# Patient Record
Sex: Male | Born: 1950 | Race: White | Hispanic: No | Marital: Married | Smoking: Former smoker
Health system: Southern US, Community
[De-identification: ages and names within clinical notes are randomized; demographics above are authoritative.]

## PROBLEM LIST (undated history)

## (undated) DIAGNOSIS — K7030 Alcoholic cirrhosis of liver without ascites: Secondary | ICD-10-CM

## (undated) DIAGNOSIS — K219 Gastro-esophageal reflux disease without esophagitis: Secondary | ICD-10-CM

## (undated) DIAGNOSIS — I10 Essential (primary) hypertension: Secondary | ICD-10-CM

## (undated) DIAGNOSIS — Z87442 Personal history of urinary calculi: Secondary | ICD-10-CM

## (undated) DIAGNOSIS — Z9221 Personal history of antineoplastic chemotherapy: Secondary | ICD-10-CM

## (undated) DIAGNOSIS — E785 Hyperlipidemia, unspecified: Secondary | ICD-10-CM

## (undated) DIAGNOSIS — I4891 Unspecified atrial fibrillation: Secondary | ICD-10-CM

## (undated) DIAGNOSIS — C220 Liver cell carcinoma: Secondary | ICD-10-CM

## (undated) DIAGNOSIS — R079 Chest pain, unspecified: Secondary | ICD-10-CM

## (undated) HISTORY — DX: Chest pain, unspecified: R07.9

## (undated) HISTORY — DX: Liver cell carcinoma: C22.0

## (undated) HISTORY — DX: Essential (primary) hypertension: I10

## (undated) HISTORY — DX: Unspecified atrial fibrillation: I48.91

## (undated) HISTORY — DX: Hyperlipidemia, unspecified: E78.5

## (undated) HISTORY — PX: APPENDECTOMY: SHX54

---

## 2007-06-01 ENCOUNTER — Emergency Department (HOSPITAL_COMMUNITY): Admission: EM | Admit: 2007-06-01 | Discharge: 2007-06-02 | Payer: Self-pay | Admitting: Emergency Medicine

## 2010-08-29 DIAGNOSIS — I10 Essential (primary) hypertension: Secondary | ICD-10-CM | POA: Insufficient documentation

## 2010-09-01 ENCOUNTER — Ambulatory Visit: Payer: Self-pay | Admitting: Cardiology

## 2010-09-04 ENCOUNTER — Encounter (INDEPENDENT_AMBULATORY_CARE_PROVIDER_SITE_OTHER): Payer: Self-pay | Admitting: *Deleted

## 2010-09-11 NOTE — Letter (Signed)
Summary: Appointment - Missed  [facility], [place]  [address] [place]   [place], [place] [address]   Phone: [phone]  Fax: [phone]          September 04, 2010 MRN: [id number]      TREYVEON OUTING [address] [place], [place]  [address]      Dear Mr. Buchler,   Our records indicate you missed your appointment on September 01, 2010 at 9:45am with Dr. Aundra Dubin. It is very important that we reach you to reschedule this appointment. We look forward to participating in your health care needs. Please contact us at the number listed above at your earliest convenience to reschedule this appointment.     Sincerely,  [place] Scheduling Team

## 2010-10-16 ENCOUNTER — Encounter: Payer: Self-pay | Admitting: Cardiology

## 2010-10-21 ENCOUNTER — Encounter: Payer: Self-pay | Admitting: Cardiology

## 2012-01-26 ENCOUNTER — Emergency Department (HOSPITAL_COMMUNITY): Payer: BC Managed Care – PPO

## 2012-01-26 ENCOUNTER — Other Ambulatory Visit: Payer: Self-pay

## 2012-01-26 ENCOUNTER — Emergency Department (HOSPITAL_COMMUNITY)
Admission: EM | Admit: 2012-01-26 | Discharge: 2012-01-26 | Disposition: A | Discharge status: Home / Self Care | Payer: BC Managed Care – PPO | Attending: Emergency Medicine | Admitting: Emergency Medicine

## 2012-01-26 ENCOUNTER — Encounter (HOSPITAL_COMMUNITY): Payer: Self-pay | Admitting: Radiology

## 2012-01-26 ENCOUNTER — Ambulatory Visit (INDEPENDENT_AMBULATORY_CARE_PROVIDER_SITE_OTHER): Payer: BC Managed Care – PPO | Admitting: Emergency Medicine

## 2012-01-26 VITALS — BP 118/90 | HR 105 | Temp 98.0°F | Resp 16

## 2012-01-26 DIAGNOSIS — R42 Dizziness and giddiness: Secondary | ICD-10-CM | POA: Insufficient documentation

## 2012-01-26 DIAGNOSIS — R079 Chest pain, unspecified: Secondary | ICD-10-CM

## 2012-01-26 DIAGNOSIS — R002 Palpitations: Secondary | ICD-10-CM | POA: Insufficient documentation

## 2012-01-26 DIAGNOSIS — Z87891 Personal history of nicotine dependence: Secondary | ICD-10-CM | POA: Insufficient documentation

## 2012-01-26 DIAGNOSIS — Z9089 Acquired absence of other organs: Secondary | ICD-10-CM | POA: Insufficient documentation

## 2012-01-26 DIAGNOSIS — I4892 Unspecified atrial flutter: Secondary | ICD-10-CM

## 2012-01-26 DIAGNOSIS — I4891 Unspecified atrial fibrillation: Secondary | ICD-10-CM

## 2012-01-26 DIAGNOSIS — R0789 Other chest pain: Secondary | ICD-10-CM

## 2012-01-26 LAB — CBC WITH DIFFERENTIAL/PLATELET
Basophils Absolute: 0.1 10*3/uL (ref 0.0–0.1)
Basophils Relative: 1 % (ref 0–1)
Eosinophils Absolute: 0.2 10*3/uL (ref 0.0–0.7)
Eosinophils Relative: 3 % (ref 0–5)
HCT: 45.8 % (ref 39.0–52.0)
Hemoglobin: 16 g/dL (ref 13.0–17.0)
Lymphocytes Relative: 33 % (ref 12–46)
Lymphs Abs: 2.5 10*3/uL (ref 0.7–4.0)
MCH: 33.1 pg (ref 26.0–34.0)
MCHC: 34.9 g/dL (ref 30.0–36.0)
MCV: 94.8 fL (ref 78.0–100.0)
Monocytes Absolute: 0.7 10*3/uL (ref 0.1–1.0)
Monocytes Relative: 8 % (ref 3–12)
Neutro Abs: 4.3 10*3/uL (ref 1.7–7.7)
Neutrophils Relative %: 55 % (ref 43–77)
Platelets: 176 10*3/uL (ref 150–400)
RBC: 4.83 MIL/uL (ref 4.22–5.81)
RDW: 13.4 % (ref 11.5–15.5)
WBC: 7.8 10*3/uL (ref 4.0–10.5)

## 2012-01-26 LAB — BASIC METABOLIC PANEL
BUN: 18 mg/dL (ref 6–23)
CO2: 23 mEq/L (ref 19–32)
Calcium: 8.6 mg/dL (ref 8.4–10.5)
Chloride: 106 mEq/L (ref 96–112)
Creatinine, Ser: 1.02 mg/dL (ref 0.50–1.35)
GFR calc Af Amer: 89 mL/min — ABNORMAL LOW (ref >90)
GFR calc non Af Amer: 77 mL/min — ABNORMAL LOW (ref >90)
Glucose, Bld: 113 mg/dL — ABNORMAL HIGH (ref 70–99)
Potassium: 3.6 mEq/L (ref 3.5–5.1)
Sodium: 139 mEq/L (ref 135–145)

## 2012-01-26 MED ORDER — DILTIAZEM HCL ER COATED BEADS 240 MG PO CP24
240.0000 mg | ORAL_CAPSULE | Freq: Once | ORAL | Status: AC
  Administered 2012-01-26: 240 mg via ORAL
  Filled 2012-01-26: qty 1

## 2012-01-26 MED ORDER — DILTIAZEM HCL ER COATED BEADS 240 MG PO CP24: 240.0000 mg | ORAL_CAPSULE | Freq: Every day | ORAL | Status: DC

## 2012-01-26 MED ORDER — ASPIRIN 81 MG PO CHEW
324.0000 mg | CHEWABLE_TABLET | Freq: Once | ORAL | Status: AC
  Administered 2012-01-26: 324 mg via ORAL

## 2012-01-26 MED ORDER — DILTIAZEM HCL 90 MG PO TABS
240.0000 mg | ORAL_TABLET | Freq: Once | ORAL | Status: DC
  Filled 2012-01-26: qty 1

## 2012-01-26 MED ORDER — DILTIAZEM HCL 50 MG/10ML IV SOLN
10.0000 mg | Freq: Once | INTRAVENOUS | Status: AC
  Administered 2012-01-26: 10 mg via INTRAVENOUS
  Filled 2012-01-26: qty 2

## 2012-01-26 MED ORDER — NITROGLYCERIN 0.3 MG SL SUBL
0.4000 mg | SUBLINGUAL_TABLET | SUBLINGUAL | Status: DC | PRN
  Administered 2012-01-26: 0.4 mg via SUBLINGUAL

## 2012-01-26 MED ORDER — DILTIAZEM HCL 100 MG IV SOLR
5.0000 mg/h | INTRAVENOUS | Status: DC
  Administered 2012-01-26: 7.5 mg/h via INTRAVENOUS

## 2012-01-26 MED ORDER — HEPARIN BOLUS VIA INFUSION
4000.0000 [IU] | Freq: Once | INTRAVENOUS | Status: AC
  Administered 2012-01-26: 4000 [IU] via INTRAVENOUS

## 2012-01-26 MED ORDER — HEPARIN (PORCINE) IN NACL 100-0.45 UNIT/ML-% IJ SOLN
1400.0000 [IU]/h | INTRAMUSCULAR | Status: DC
  Administered 2012-01-26: 1400 [IU]/h via INTRAVENOUS
  Filled 2012-01-26: qty 250

## 2012-01-26 NOTE — ED Notes (Signed)
Pt presents with a new onset of a fib. Pt reports intermittent chest pressure and palpations with exertion X 1 day. Pt states that he has had palpations X 3 years. Pt has a familial hx of cardiac problem. Pt received nitro X 1 and 324 ASAmg at [facility]

## 2012-01-26 NOTE — ED Provider Notes (Signed)
Heart rate remains below 100 after two hours of PO Diltiazem dose. Per Dr. Elmarie Shiley instructions, patient can be discharged home and will be provided follow up in the office. He is being discharged on Diltiazem 240 mg daily.   Leotis Shames, PA-C 01/26/12 2224

## 2012-01-26 NOTE — ED Provider Notes (Signed)
History     CSN: [id number]  Arrival date & time 01/26/12  1552   First MD Initiated Contact with Patient 01/26/12 1601      Chief Complaint  Patient presents with  . Atrial Fibrillation    (Consider location/radiation/quality/duration/timing/severity/associated sxs/prior treatment) Patient is a 62 y.o. male presenting with atrial fibrillation. The history is provided by the patient.  Atrial Fibrillation This is a new problem. Episode onset: undetermined and it could be greater than one year ago. The problem occurs intermittently. The problem has been unchanged. Pertinent negatives include no abdominal pain, chest pain, congestion, coughing, fatigue, fever, headaches, nausea, neck pain, numbness, rash, sore throat, vomiting or weakness. Nothing aggravates the symptoms. He has tried nothing for the symptoms. The treatment provided no relief.    History reviewed. No pertinent past medical history.  Past Surgical History  Procedure Date  . Appendectomy     History reviewed. No pertinent family history.  History  Substance Use Topics  . Smoking status: Former Research scientist (life sciences)  . Smokeless tobacco: Not on file  . Alcohol Use: 1.2 oz/week    2 Cans of beer per week      Review of Systems  Constitutional: Negative for fever, activity change, appetite change and fatigue.  HENT: Negative for congestion, sore throat, facial swelling, rhinorrhea, trouble swallowing, neck pain, neck stiffness, voice change and sinus pressure.   Eyes: Negative.   Respiratory: Negative for cough, choking, chest tightness, shortness of breath and wheezing.   Cardiovascular: Positive for palpitations. Negative for chest pain and leg swelling.  Gastrointestinal: Negative for nausea, vomiting and abdominal pain.  Genitourinary: Negative for dysuria, urgency, frequency, hematuria, flank pain and difficulty urinating.  Musculoskeletal: Negative for back pain and gait problem.  Skin: Negative for rash and wound.    Neurological: Positive for light-headedness. Negative for facial asymmetry, weakness, numbness and headaches.  Psychiatric/Behavioral: Negative for behavioral problems, confusion and agitation. The patient is not nervous/anxious and is not hyperactive.   All other systems reviewed and are negative.    Allergies  Review of patient's allergies indicates no known allergies.  Home Medications   Current Outpatient Rx  Name Route Sig Dispense Refill  . IBUPROFEN 100 MG PO TABS Oral Take 100 mg by mouth every 6 (six) hours as needed.      BP 147/85  Pulse 149  Temp 97.7 F (36.5 C) (Oral)  Resp 19  SpO2 97%  Physical Exam  Nursing note and vitals reviewed. Constitutional: He is oriented to person, place, and time. He appears well-developed and well-nourished. No distress.  HENT:  Head: Normocephalic and atraumatic.  Right Ear: External ear normal.  Left Ear: External ear normal.  Mouth/Throat: No oropharyngeal exudate.  Eyes: Conjunctivae and EOM are normal. Pupils are equal, round, and reactive to light. Right eye exhibits no discharge. Left eye exhibits no discharge.  Neck: Normal range of motion. Neck supple. No JVD present. No tracheal deviation present. No thyromegaly present.  Cardiovascular: Normal heart sounds and intact distal pulses.  Exam reveals no gallop and no friction rub.   No murmur heard.      Irregularly irregular and tachycardia   Pulmonary/Chest: Effort normal and breath sounds normal. No respiratory distress. He has no wheezes. He exhibits no tenderness.  Abdominal: Soft. Bowel sounds are normal. He exhibits no distension. There is no tenderness. There is no rebound and no guarding.  Musculoskeletal: Normal range of motion. He exhibits no edema and no tenderness.  Lymphadenopathy:  He has no cervical adenopathy.  Neurological: He is alert and oriented to person, place, and time. No cranial nerve deficit.  Skin: Skin is warm and dry. No rash noted. He is  not diaphoretic. No pallor.  Psychiatric: He has a normal mood and affect. His behavior is normal.    ED Course  Procedures (including critical care time)   Labs Reviewed  TSH  CBC WITH DIFFERENTIAL  BASIC METABOLIC PANEL   No results found.   No diagnosis found.    MDM  61 year old male with no reported past medical history presents here after being seen by his primary care physician today and diagnosed with atrial fibrillation with RVR. Patient says that for the past year he has episodes where he feels fluttering sensation in his chest and lightheadedness the last 10-20 minutes and resolves on its own. Patient said he was having such episode today decided to call the primary care physician and be evaluated, that is when they found that he was in atrial fibrillation with RVR. Patient not complaining of chest pain but rather a fluttery sensation in his chest associated with some lightheadedness. Patient denies shortness of breath nausea vomiting abdominal pain fevers cough. Patient is tachycardic but without chest pain or signs of DVT hemoptysis or tachypnea so I doubt new onset pulmonary embolism. Patient has had these episodes for greater than a year started doubt new medical process but rather patient just recently found out what was causing the symptoms. Will get chest x-ray thyroid studies and electrolytes studies and we'll anticoagulate the patient given the fact that we don't know how long he been in an irregular rhythm. We'll also give diltiazem to control rate and call cardiology for possible future cardioversion.  Results for orders placed during the hospital encounter of 01/26/12  CBC WITH DIFFERENTIAL      Component Value Range   WBC 7.8  4.0 - 10.5 K/uL   RBC 4.83  4.22 - 5.81 MIL/uL   Hemoglobin 16.0  13.0 - 17.0 g/dL   HCT 45.8  39.0 - 52.0 %   MCV 94.8  78.0 - 100.0 fL   MCH 33.1  26.0 - 34.0 pg   MCHC 34.9  30.0 - 36.0 g/dL   RDW 13.4  11.5 - 15.5 %   Platelets 176   150 - 400 K/uL   Neutrophils Relative 55  43 - 77 %   Neutro Abs 4.3  1.7 - 7.7 K/uL   Lymphocytes Relative 33  12 - 46 %   Lymphs Abs 2.5  0.7 - 4.0 K/uL   Monocytes Relative 8  3 - 12 %   Monocytes Absolute 0.7  0.1 - 1.0 K/uL   Eosinophils Relative 3  0 - 5 %   Eosinophils Absolute 0.2  0.0 - 0.7 K/uL   Basophils Relative 1  0 - 1 %   Basophils Absolute 0.1  0.0 - 0.1 K/uL  BASIC METABOLIC PANEL      Component Value Range   Sodium 139  135 - 145 mEq/L   Potassium 3.6  3.5 - 5.1 mEq/L   Chloride 106  96 - 112 mEq/L   CO2 23  19 - 32 mEq/L   Glucose, Bld 113 (*) 70 - 99 mg/dL   BUN 18  6 - 23 mg/dL   Creatinine, Ser 1.02  0.50 - 1.35 mg/dL   Calcium 8.6  8.4 - 10.5 mg/dL   GFR calc non Af Amer 77 (*) >90 mL/min  GFR calc Af Amer 89 (*) >90 mL/min   DG Chest 2 View (Final result)   Result time:01/26/12 1645    Final result by Rad Results In Interface (01/26/12 16:45:29)    Narrative:   *RADIOLOGY REPORT*  Clinical Data: Atrial fibrillation  CHEST - 2 VIEW  Comparison: None.  Findings: Cardiomediastinal silhouette is unremarkable. No acute infiltrate or pleural effusion. No pulmonary edema. Bony thorax is unremarkable.  IMPRESSION: No active disease.  Original Report Authenticated By: Lahoma Crocker, M.D.    Date: 01/27/2012  Rate: 130- 140  Rhythm: atrial fibrillation  QRS Axis: normal  Intervals: normal  ST/T Wave abnormalities: normal  Conduction Disutrbances:none  Narrative Interpretation:   Old EKG Reviewed: none available  Case discussed with on-call cardiologist who feels given his CHADS2 score of 1 for hypertension patient is at low risk for embolic events and does not need to be anticoagulated at this time. Suggested discontinuation of heparin and to transition patient from diltiazem drip to by mouth diltiazem. Patient rate controlled with 10 mg diltiazem and 7.5 mg per hour diltiazem drip. Patient given 240 mg diltiazem by mouth and sent to CDU to ensure  that heart rate remains stable. If he remains stable patient will followup with cardiology tomorrow will be instructed to take aspirin daily and will be instructed to take diltiazem 240 mg by mouth daily.  Case discussed with Dr. Jeanine Luz, MD 01/27/12 [phone]

## 2012-01-26 NOTE — Progress Notes (Signed)
  Subjective:    Patient ID: Dillon Perkins, male    DOB: [birth year], 61 y.o.   MRN: [id number]  HPI    Review of Systems     Objective:   Physical Exam        Assessment & Plan:

## 2012-01-26 NOTE — Progress Notes (Signed)
2L O2 started at 2:45 p.m by Windell Hummingbird, PA-C

## 2012-01-26 NOTE — Progress Notes (Signed)
ANTICOAGULATION CONSULT NOTE - Initial Consult  Pharmacy Consult for Heparin Indication: Atrial fibrillation  No Known Allergies  Patient Measurements: Height: 5'8" Weight: 99.8kg Heparin dosing weight: 89.7kg  Vital Signs: Temp: 97.7 F (36.5 C) (07/23 1608) Temp src: Oral (07/23 1608) BP: 147/85 mmHg (07/23 1608) Pulse Rate: 149  (07/23 1608)  Medical History: History reviewed. No pertinent past medical history.  Medications:  No anticoagulants pta  Assessment: Patient is a 61 year old male who presents with new onset afib with RVR. He will begin IV heparin. Baseline CBC and renal function within normal limits.  Goal of Therapy:  Heparin level 0.3-0.7 units/ml Monitor platelets by anticoagulation protocol: Yes   Plan:  1. Initiate heparin bolus 4000 units. 2  Initiate heparin maintenance dose 1400 units/hour 3. Draw 6 hour heparin level 4. Daily heparin level and CBC  Bola A. Cody, [place] Pharmacist Pager:[phone] Phone [phone] 01/26/2012 4:53 PM

## 2012-01-26 NOTE — Progress Notes (Signed)
  Subjective:    Patient ID: Dillon Perkins, male    DOB: 17-Apr-1951, 61 y.o.   MRN: [id number]  Chest Pain  This is a new problem. The current episode started today. The onset quality is sudden. The problem occurs constantly. The problem has been gradually improving. The pain is present in the substernal region. The pain is at a severity of 3/10. The pain is mild. The quality of the pain is described as pressure. The pain does not radiate. Associated symptoms include exertional chest pressure. Pertinent negatives include no abdominal pain, back pain, claudication, cough, diaphoresis, dizziness, fever, headaches, hemoptysis, irregular heartbeat, leg pain, lower extremity edema, malaise/fatigue, nausea, near-syncope, numbness, orthopnea, palpitations, PND, shortness of breath, sputum production, syncope, vomiting or weakness. The pain is aggravated by exertion. He has tried nothing for the symptoms. Risk factors include lack of exercise, male gender, obesity and smoking/tobacco exposure.  His past medical history is significant for hyperlipidemia and hypertension.  Pertinent negatives for past medical history include no aneurysm, no anxiety/panic attacks, no aortic aneurysm, no aortic dissection, no arrhythmia, no bicuspid aortic valve, no CAD, no cancer, no congenital heart disease, no connective tissue disease, no COPD, no CHF, no diabetes, no DVT, no hyperhomocysteinemia, no Marfan's syndrome, no MI, no mitral valve prolapse, no pacemaker, no PE, no PVD, no recent injury, no rheumatic fever, no seizures, no sickle cell disease, no sleep apnea, no spontaneous pneumothorax, no stimulant use, no strokes, no thyroid problem, no TIA and Turner syndrome.  His family medical history is significant for CAD in family, heart disease in family, hyperlipidemia in family, hypertension in family and early MI in family.  Pertinent negatives for family medical history include: family history of aortic dissection, no  connective tissue disease in family, no diabetes in family, no PE in family, no PVD in family, no sickle cell disease in family, no stroke in family and no TIA in family. Prior diagnostic workup includes echocardiogram.      Review of Systems  Constitutional: Positive for fatigue. Negative for fever, chills, malaise/fatigue, diaphoresis, activity change, appetite change and unexpected weight change.  HENT: Negative.   Eyes: Negative.   Respiratory: Negative.  Negative for cough, hemoptysis, sputum production and shortness of breath.   Cardiovascular: Positive for chest pain. Negative for palpitations, orthopnea, claudication, leg swelling, syncope, PND and near-syncope.  Gastrointestinal: Negative.  Negative for nausea, vomiting and abdominal pain.  Genitourinary: Negative.   Musculoskeletal: Negative.  Negative for back pain.  Neurological: Negative for dizziness, seizures, weakness, numbness and headaches.  Hematological: Negative.        Objective:   Physical Exam  Constitutional: He is oriented to person, place, and time. He appears well-developed and well-nourished.  HENT:  Head: Normocephalic and atraumatic.  Right Ear: External ear normal.  Left Ear: External ear normal.  Eyes: Conjunctivae are normal. Pupils are equal, round, and reactive to light.  Neck: Normal range of motion. No tracheal deviation present.  Cardiovascular: Normal rate, normal heart sounds and intact distal pulses.  An irregularly irregular rhythm present.  Pulmonary/Chest: Effort normal and breath sounds normal.  Abdominal: Soft. There is no tenderness.  Musculoskeletal: Normal range of motion.  Neurological: He is alert and oriented to person, place, and time. No cranial nerve deficit.  Skin: Skin is warm and dry.          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

## 2012-01-26 NOTE — ED Notes (Signed)
Pt discharged home with family in good condition.

## 2012-01-26 NOTE — ED Notes (Signed)
MD at bedside. 

## 2012-01-27 ENCOUNTER — Encounter: Payer: Self-pay | Admitting: Cardiology

## 2012-01-27 ENCOUNTER — Ambulatory Visit (INDEPENDENT_AMBULATORY_CARE_PROVIDER_SITE_OTHER): Payer: BC Managed Care – PPO | Admitting: Cardiology

## 2012-01-27 VITALS — BP 149/101 | HR 88 | Ht 68.0 in | Wt 217.0 lb

## 2012-01-27 DIAGNOSIS — R0989 Other specified symptoms and signs involving the circulatory and respiratory systems: Secondary | ICD-10-CM

## 2012-01-27 DIAGNOSIS — I4891 Unspecified atrial fibrillation: Secondary | ICD-10-CM

## 2012-01-27 DIAGNOSIS — R0683 Snoring: Secondary | ICD-10-CM

## 2012-01-27 DIAGNOSIS — I10 Essential (primary) hypertension: Secondary | ICD-10-CM

## 2012-01-27 LAB — TSH: TSH: 2.068 u[IU]/mL (ref 0.350–4.500)

## 2012-01-27 MED ORDER — DILTIAZEM HCL ER COATED BEADS 240 MG PO CP24: 240.0000 mg | ORAL_CAPSULE | Freq: Every day | ORAL | Status: DC

## 2012-01-27 NOTE — Patient Instructions (Addendum)
Your physician has recommended you make the following change in your medication: START Diltiazem CD 240mg  take one by mouth daily, START Aspirin 325mg  take one by mouth daily  Your physician recommends that you schedule a follow-up appointment in: [place] with Dr Lia Foyer  Your physician recommends that you have lab work in 1 WEEK: LIPID, LIVER and Free T4--nothing to eat or drink after midnight, lab opens at 8:30  Your physician has requested that you have an echocardiogram. Echocardiography is a painless test that uses sound waves to create images of your heart. It provides your doctor with information about the size and shape of your heart and how well your heart's chambers and valves are working. This procedure takes approximately one hour. There are no restrictions for this procedure.  Your physician has recommended that you have a sleep study. This test records several body functions during sleep, including: brain activity, eye movement, oxygen and carbon dioxide blood levels, heart rate and rhythm, breathing rate and rhythm, the flow of air through your mouth and nose, snoring, body muscle movements, and chest and belly movement.

## 2012-01-27 NOTE — ED Provider Notes (Signed)
Medical screening examination/treatment/procedure(s) were performed by non-physician practitioner and as supervising physician I was immediately available for consultation/collaboration.   [name]. Alik Mawson, MD 01/27/12 1656

## 2012-01-27 NOTE — ED Provider Notes (Signed)
I saw and evaluated the patient, reviewed the resident's note and I agree with the findings and plan.  I reviewed and agree with ECG interpretation by Dr. Kerin Ransom.  Pt with no CP, slight dyspnea and feeling light headed, has had numerous episodes in the past, never had prior work up or eval.  Saw PM who sent to the ED.  Pt had rate control with IV diltiazem initially, tolerated well.  Dr. Kerin Ransom discussed with Dr. Acie Fredrickson who felt pt could be monitored in the ED, transitioned to oral diltiazem and follow up in office.  Pt moved to CDU once on oral meds and observed.  CRITICAL CARE Performed by: Donzetta Matters   Total critical care time:30 min  Critical care time was exclusive of separately billable procedures and treating other patients.  Critical care was necessary to treat or prevent imminent or life-threatening deterioration.  Critical care was time spent personally by me on the following activities: development of treatment plan with patient and/or surrogate as well as nursing, discussions with consultants, evaluation of patient's response to treatment, examination of patient, obtaining history from patient or surrogate, ordering and performing treatments and interventions, ordering and review of laboratory studies, ordering and review of radiographic studies, pulse oximetry and re-evaluation of patient's condition.     Pt stabilized in terms of HR, BP maintained as was mentation, other labs, CXR unremarkable.  No ischemia.  ACS not suspected.  Pt eventually discharged from CDU.    Impression: Atrial fibrillation with RVR   [name]. Chon Buhl, MD 01/27/12 1659

## 2012-01-31 ENCOUNTER — Encounter: Payer: Self-pay | Admitting: Cardiology

## 2012-01-31 DIAGNOSIS — R0683 Snoring: Secondary | ICD-10-CM | POA: Insufficient documentation

## 2012-01-31 DIAGNOSIS — I4891 Unspecified atrial fibrillation: Secondary | ICD-10-CM | POA: Insufficient documentation

## 2012-01-31 NOTE — Assessment & Plan Note (Signed)
Snores at night.  May have had a remote sleep study but we do not have.  Would recommend.

## 2012-01-31 NOTE — Progress Notes (Signed)
HPI:  The patient is seen as an add-on today. He was seen in the emergency room, and the following was the documentation of the EDP:  61 year old male with no reported past medical history presents here after being seen by his primary care physician today and diagnosed with atrial fibrillation with RVR. Patient says that for the past year he has episodes where he feels fluttering sensation in his chest and lightheadedness the last 10-20 minutes and resolves on its own. Patient said he was having such episode today decided to call the primary care physician and be evaluated, that is when they found that he was in atrial fibrillation with RVR. Patient not complaining of chest pain but rather a fluttery sensation in his chest associated with some lightheadedness. Patient denies shortness of breath nausea vomiting abdominal pain fevers cough. Patient is tachycardic but without chest pain or signs of DVT hemoptysis or tachypnea so I doubt new onset pulmonary embolism. Patient has had these episodes for greater than a year started doubt new medical process but rather patient just recently found out what was causing the symptoms. Will get chest x-ray thyroid studies and electrolytes studies and we'll anticoagulate the patient given the fact that we don't know how long he been in an irregular rhythm. We'll also give diltiazem to control rate and call cardiology for possible future cardioversion.  Heart rate remains below 100 after two hours of PO Diltiazem dose. Per Dr. Elmarie Shiley instructions, patient can be discharged home and will be provided follow up in the office. He is being discharged on Diltiazem 240 mg daily.  Leotis Shames, PA-C  01/26/12 2224  Hence, he is added on to the doctor of the day.  He has had no exertional chest pain.  He has had no bleeding issues.  He does admit to snoring at night, and may have had a sleep study at some point in the past.  He is now here for evaluation.  He also has not  had weight loss, nor heat or cold intolerance.  The patient works at [employer] so he does do a lot of walking.     Current Outpatient Prescriptions  Medication Sig Dispense Refill  . diltiazem (CARDIZEM CD) 240 MG 24 hr capsule Take 1 capsule (240 mg total) by mouth daily.  30 capsule  6  . ibuprofen (ADVIL,MOTRIN) 200 MG tablet Take 600 mg by mouth every 6 (six) hours as needed. For pain      . aspirin EC 325 MG tablet Take 1 tablet (325 mg total) by mouth daily.  1 tablet  0    No Known Allergies  Past Medical History  Diagnosis Date  . Atrial fibrillation   . Chest pain   . Hyperlipidemia   . Hypertension     Past Surgical History  Procedure Date  . Appendectomy     Family History  Problem Relation Age of Onset  . Cancer Mother   . Heart disease Father   . Heart disease Sister     History   Social History  . Marital Status: Married    Spouse Name: N/A    Number of Children: N/A  . Years of Education: N/A   Occupational History  . Not on file.   Social History Main Topics  . Smoking status: Former Research scientist (life sciences)  . Smokeless tobacco: Not on file  . Alcohol Use: 1.2 oz/week    2 Cans of beer per week  . Drug Use: Not on file  .  Sexually Active: Not on file   Other Topics Concern  . Not on file   Social History Narrative  . No narrative on file    ROS: Please see the HPI.  All other systems reviewed and negative.  He specifically denies history of bleeding, syncope, presyncope, chest pain, pulmonary issues.  He admits to snoring.   PHYSICAL EXAM:  BP 149/101  Pulse 88  Ht 5\' 8"  (1.727 m)  Wt 217 lb (98.431 kg)  BMI 32.99 kg/m2  130/90 by me equal in both arms.    General: Well developed, well nourished, in no acute distress. Head:  Normocephalic and atraumatic. Neck: no JVD Lungs: Clear to auscultation and percussion. Heart: Normal S1 and S2.  No murmur, rubs or gallops. REGULAR on exam today in clinic.  Abdomen:  Normal bowel sounds; soft; non tender;  no organomegaly Pulses: Pulses normal in all 4 extremities. Extremities: No clubbing or cyanosis. No edema. Neurologic: Alert and oriented x 3.  EKG:  Tracing 1.  Atrial fib with rapid ventricular response    #2.  NSR.  Nonspecific T wave flattening  ASSESSMENT AND PLAN:

## 2012-01-31 NOTE — Assessment & Plan Note (Addendum)
Elevated today.  Will stop dilt  (he has not filled) and add once per day therapy for rate control and BP.  Reviewed with patient.

## 2012-01-31 NOTE — Assessment & Plan Note (Addendum)
Has converted to NSR.  His BP is elevated, so he does have a CHADS2 1 score, and CHADS vasc 2 score of 1 as well given his age and gender, and lack of other diagnoses.  He is at fairly low risk.  We will add dilt CD to his regimen, complete his thyroid workup, get a 2D echo study to assess, and see how he does.  Also, we will get him back for follow up.  He is in agreement.  Sleep study also in order.  TSH was normal.

## 2012-02-02 ENCOUNTER — Other Ambulatory Visit (INDEPENDENT_AMBULATORY_CARE_PROVIDER_SITE_OTHER): Payer: BC Managed Care – PPO

## 2012-02-02 ENCOUNTER — Ambulatory Visit (HOSPITAL_COMMUNITY): Payer: BC Managed Care – PPO | Attending: Cardiology

## 2012-02-02 DIAGNOSIS — I4891 Unspecified atrial fibrillation: Secondary | ICD-10-CM | POA: Insufficient documentation

## 2012-02-02 DIAGNOSIS — I080 Rheumatic disorders of both mitral and aortic valves: Secondary | ICD-10-CM | POA: Insufficient documentation

## 2012-02-02 DIAGNOSIS — I10 Essential (primary) hypertension: Secondary | ICD-10-CM | POA: Insufficient documentation

## 2012-02-02 DIAGNOSIS — R072 Precordial pain: Secondary | ICD-10-CM | POA: Insufficient documentation

## 2012-02-02 DIAGNOSIS — I079 Rheumatic tricuspid valve disease, unspecified: Secondary | ICD-10-CM | POA: Insufficient documentation

## 2012-02-02 DIAGNOSIS — E785 Hyperlipidemia, unspecified: Secondary | ICD-10-CM | POA: Insufficient documentation

## 2012-02-02 LAB — LIPID PANEL
Cholesterol: 165 mg/dL (ref 0–200)
HDL: 42.8 mg/dL (ref >39.00)
LDL Cholesterol: 93 mg/dL (ref 0–99)
Total CHOL/HDL Ratio: 4
Triglycerides: 144 mg/dL (ref 0.0–149.0)
VLDL: 28.8 mg/dL (ref 0.0–40.0)

## 2012-02-02 LAB — HEPATIC FUNCTION PANEL
ALT: 76 U/L — ABNORMAL HIGH (ref 0–53)
AST: 48 U/L — ABNORMAL HIGH (ref 0–37)
Albumin: 3.9 g/dL (ref 3.5–5.2)
Alkaline Phosphatase: 52 U/L (ref 39–117)
Bilirubin, Direct: 0.1 mg/dL (ref 0.0–0.3)
Total Bilirubin: 0.7 mg/dL (ref 0.3–1.2)
Total Protein: 7.1 g/dL (ref 6.0–8.3)

## 2012-02-02 LAB — T4, FREE: Free T4: 0.64 ng/dL (ref 0.60–1.60)

## 2012-02-02 NOTE — Progress Notes (Signed)
Echocardiogram performed.  

## 2012-02-04 ENCOUNTER — Ambulatory Visit (INDEPENDENT_AMBULATORY_CARE_PROVIDER_SITE_OTHER): Payer: BC Managed Care – PPO | Admitting: Cardiology

## 2012-02-04 ENCOUNTER — Encounter: Payer: Self-pay | Admitting: Cardiology

## 2012-02-04 VITALS — BP 157/84 | HR 79 | Ht 68.0 in | Wt 220.4 lb

## 2012-02-04 DIAGNOSIS — R7989 Other specified abnormal findings of blood chemistry: Secondary | ICD-10-CM

## 2012-02-04 DIAGNOSIS — I4891 Unspecified atrial fibrillation: Secondary | ICD-10-CM

## 2012-02-04 DIAGNOSIS — R0989 Other specified symptoms and signs involving the circulatory and respiratory systems: Secondary | ICD-10-CM

## 2012-02-04 DIAGNOSIS — R0609 Other forms of dyspnea: Secondary | ICD-10-CM

## 2012-02-04 DIAGNOSIS — E785 Hyperlipidemia, unspecified: Secondary | ICD-10-CM

## 2012-02-04 DIAGNOSIS — R945 Abnormal results of liver function studies: Secondary | ICD-10-CM

## 2012-02-04 DIAGNOSIS — R0683 Snoring: Secondary | ICD-10-CM

## 2012-02-04 DIAGNOSIS — I10 Essential (primary) hypertension: Secondary | ICD-10-CM

## 2012-02-04 NOTE — Progress Notes (Signed)
   HPI:  He  is seen in followup. From a clinical standpoint he is doing well. He's not noted that he is gone into atrial fibrillation. It should be noted his laboratory studies returned and his mild elevation in liver function studies and his T4 is normal. He's not had any chest pain patient and  is being set up for a stress test. His left ventricular function is normal by echo with a pattern of mild left ventricular hypertrophy.  He is tolerating his meds well.     Current Outpatient Prescriptions  Medication Sig Dispense Refill  . aspirin EC 325 MG tablet Take 1 tablet (325 mg total) by mouth daily.  1 tablet  0  . diltiazem (CARDIZEM CD) 240 MG 24 hr capsule Take 1 capsule (240 mg total) by mouth daily.  30 capsule  6  . ibuprofen (ADVIL,MOTRIN) 200 MG tablet Take 600 mg by mouth every 6 (six) hours as needed. For pain        No Known Allergies  Past Medical History  Diagnosis Date  . Atrial fibrillation   . Chest pain   . Hyperlipidemia   . Hypertension     Past Surgical History  Procedure Date  . Appendectomy     Family History  Problem Relation Age of Onset  . Heart disease Father   . Heart disease Sister     History   Social History  . Marital Status: Married    Spouse Name: N/A    Number of Children: N/A  . Years of Education: N/A   Occupational History  . Not on file.   Social History Main Topics  . Smoking status: Former Research scientist (life sciences)  . Smokeless tobacco: Never Used  . Alcohol Use: 1.2 oz/week    2 Cans of beer per week  . Drug Use: Not on file  . Sexually Active: Not on file   Other Topics Concern  . Not on file   Social History Narrative  . No narrative on file    ROS: Please see the HPI.  All other systems reviewed and negative.  PHYSICAL EXAM:  BP 157/84  Pulse 79  Ht 5\' 8"  (1.727 m)  Wt 220 lb 6.4 oz (99.973 kg)  BMI 33.51 kg/m2  General: Well developed, well nourished, in no acute distress. Head:  Normocephalic and atraumatic. Neck: no  JVD Lungs: Clear to auscultation and percussion. Heart: Normal S1 and S2.  No murmur, rubs or gallops.  Abdomen:  Normal bowel sounds; soft; non tender; no organomegaly Pulses: Pulses normal in all 4 extremities. Extremities: No clubbing or cyanosis. No edema. Neurologic: Alert and oriented x 3.  EKG:  NSR.with sinus arrhythmia.  No acute changes.  ASSESSMENT AND PLAN:

## 2012-02-07 DIAGNOSIS — E785 Hyperlipidemia, unspecified: Secondary | ICD-10-CM | POA: Insufficient documentation

## 2012-02-07 DIAGNOSIS — R945 Abnormal results of liver function studies: Secondary | ICD-10-CM | POA: Insufficient documentation

## 2012-02-07 NOTE — Assessment & Plan Note (Signed)
No obvious recurrence.  Have recommended for now just an ASA, and meds for rate control should he develop.  We may want to get a holter eventually to see if he is in and out.

## 2012-02-07 NOTE — Assessment & Plan Note (Signed)
Defer treatment to [place].

## 2012-02-07 NOTE — Assessment & Plan Note (Signed)
Sleep study has been scheduled for February 16, 2012.  Will await results.  He is sure he has it.

## 2012-02-07 NOTE — Assessment & Plan Note (Signed)
BP is somewhat improved, but has a way to do.  Treatment of OSA, if present, will help.  I have encouraged him to get a home BP cuff, and write down the results and take to Dr. Everlene Farrier for follow up.

## 2012-02-09 NOTE — Patient Instructions (Signed)
Your physician has requested that you have an exercise tolerance test. For further information please visit [email or web address]. Please also follow instruction sheet, as given.  Your physician recommends that you continue on your current medications as directed. Please refer to the Current Medication list given to you today.

## 2012-02-16 ENCOUNTER — Ambulatory Visit (HOSPITAL_BASED_OUTPATIENT_CLINIC_OR_DEPARTMENT_OTHER): Payer: BC Managed Care – PPO | Attending: Cardiology | Admitting: Radiology

## 2012-02-16 VITALS — Ht 68.0 in | Wt 220.0 lb

## 2012-02-16 DIAGNOSIS — G4710 Hypersomnia, unspecified: Secondary | ICD-10-CM | POA: Insufficient documentation

## 2012-02-16 DIAGNOSIS — I4891 Unspecified atrial fibrillation: Secondary | ICD-10-CM

## 2012-02-17 DIAGNOSIS — G4730 Sleep apnea, unspecified: Secondary | ICD-10-CM

## 2012-02-17 DIAGNOSIS — G4710 Hypersomnia, unspecified: Secondary | ICD-10-CM

## 2012-02-18 NOTE — Procedures (Signed)
Dillon Perkins, Dillon Perkins             ACCOUNT NO.:  [id number]  MEDICAL RECORD NO.:  [id number]          PATIENT TYPE:  OUT  LOCATION:  SLEEP CENTER                 FACILITY:  [facility]  PHYSICIAN:  Kathee Delton, MD,FCCPDATE OF BIRTH:  06/16/49  DATE OF STUDY:  02/16/2012                           NOCTURNAL POLYSOMNOGRAM  REFERRING PHYSICIAN:  Loretha Brasil. Lia Foyer, MD, [facility]  LOCATION:  Sleep Lab.  INDICATION FOR STUDY:  Hypersomnia with sleep apnea.  EPWORTH SLEEPINESS SCORE:  7.  MEDICATIONS:  SLEEP ARCHITECTURE:  The patient had a total sleep time of only 175 minutes, with no slow-wave sleep and only 18 minutes of REM.  Sleep onset latency was normal at 11 minutes, and REM onset was prolonged at 315 minutes.  Sleep efficiency was poor at 45%.  RESPIRATORY DATA:  The patient was found to have 2 obstructive apneas and 2 obstructive hypopneas, giving him an AHI of only 1.4 events per hour.  He was also noted to have moderate numbers of episodes with decrease in air flow, however did not meet the strict criteria for an obstructive hypopnea or apnea.  These are consistent with respiratory effort related arousals.  The patient's events occurred in all body positions, and there was moderate-to-loud snoring noted throughout.  The patient did not meet split night criteria secondary to his small numbers of formal obstructive events.  OXYGEN DATA:  There was O2 desaturation as low as 90% with the patient's obstructive events.  CARDIAC DATA:  Occasional PVC noted, but no clinically significant arrhythmias were seen.  MOVEMENT-PARASOMNIA:  The patient had small numbers of leg jerks, which were not clinically significant.  There were no abnormal behaviors noted.  IMPRESSIONS-RECOMMENDATIONS: 1. Small numbers of obstructive events, which do not meet the AHI     criteria for the obstructive sleep apnea syndrome.  The patient did     have decreased total sleep time and REM, however he had  more than     adequate opportunity to exhibit clinically significant sleep apnea.     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     This is a pre-sleep apnea condition that he is typically treated     with weight loss, positional therapy, upper airway surgery, and     possibly a dental appliance. 2. Occasional PVC noted, but no clinically significant arrhythmias     were seen.     Kathee Delton, MD,FCCP Diplomate, [place] Board of Sleep Medicine    [facility]/MEDQ  D:  02/17/2012 08:48:29  T:  02/18/2012 00:01:50  Job:  [id number]

## 2012-03-18 ENCOUNTER — Encounter: Payer: BC Managed Care – PPO | Admitting: Cardiology

## 2012-03-22 ENCOUNTER — Telehealth: Payer: Self-pay | Admitting: Cardiology

## 2012-03-22 NOTE — Telephone Encounter (Signed)
Left message to call back  

## 2012-03-22 NOTE — Telephone Encounter (Signed)
Pt no showed for GXT on 03/18/12 with Dr Lia Foyer. I left the pt a message to call back.

## 2012-03-22 NOTE — Telephone Encounter (Signed)
Pt request results of sleep apnea test

## 2012-03-30 NOTE — Telephone Encounter (Signed)
Left message for pt to call back for results of sleep study.  (Per TS sleep study is okay the pt does not have OSA) The pt also needs to reschedule GXT with Dr Lia Foyer.

## 2012-04-05 NOTE — Telephone Encounter (Signed)
Letter mailed to the pt to contact our office.

## 2012-04-27 ENCOUNTER — Telehealth: Payer: Self-pay | Admitting: Cardiology

## 2012-04-27 NOTE — Telephone Encounter (Signed)
New problem:  Test results - sleep apnea

## 2012-04-27 NOTE — Telephone Encounter (Signed)
I spoke with the pt and made him aware of sleep study results.  I also asked the pt if he would like to reschedule his stress test.  The pt would like to schedule this test again in 30 days and he would like a call from a scheduler.  I will have Lela contact the pt.

## 2012-07-27 ENCOUNTER — Telehealth: Payer: Self-pay | Admitting: *Deleted

## 2012-07-27 ENCOUNTER — Encounter: Payer: BC Managed Care – PPO | Admitting: Cardiology

## 2012-07-27 NOTE — Progress Notes (Signed)
This encounter was created in error - please disregard.

## 2012-07-27 NOTE — Telephone Encounter (Signed)
Pt No Show for GXT. TK

## 2012-07-29 ENCOUNTER — Ambulatory Visit (INDEPENDENT_AMBULATORY_CARE_PROVIDER_SITE_OTHER): Payer: BC Managed Care – PPO | Admitting: Emergency Medicine

## 2012-07-29 VITALS — BP 165/93 | HR 72 | Temp 98.9°F | Resp 16 | Ht 68.0 in | Wt 223.4 lb

## 2012-07-29 DIAGNOSIS — J111 Influenza due to unidentified influenza virus with other respiratory manifestations: Secondary | ICD-10-CM

## 2012-07-29 DIAGNOSIS — I10 Essential (primary) hypertension: Secondary | ICD-10-CM

## 2012-07-29 LAB — POCT INFLUENZA A/B
Influenza A, POC: NEGATIVE
Influenza B, POC: NEGATIVE

## 2012-07-29 MED ORDER — OSELTAMIVIR PHOSPHATE 75 MG PO CAPS: 75.0000 mg | ORAL_CAPSULE | Freq: Two times a day (BID) | ORAL | Status: DC

## 2012-07-29 MED ORDER — METOPROLOL SUCCINATE ER 50 MG PO TB24: 50.0000 mg | ORAL_TABLET | Freq: Every day | ORAL | Status: DC

## 2012-07-29 NOTE — Patient Instructions (Signed)
Hypertension As your heart beats, it forces blood through your arteries. This force is your blood pressure. If the pressure is too high, it is called hypertension (HTN) or high blood pressure. HTN is dangerous because you may have it and not know it. High blood pressure may mean that your heart has to work harder to pump blood. Your arteries may be narrow or stiff. The extra work puts you at risk for heart disease, stroke, and other problems.  Blood pressure consists of two numbers, a higher number over a lower, 110/72, for example. It is stated as "110 over 72." The ideal is below 120 for the top number (systolic) and under 80 for the bottom (diastolic). Write down your blood pressure today. You should pay close attention to your blood pressure if you have certain conditions such as:  Heart failure.  Prior heart attack.  Diabetes  Chronic kidney disease.  Prior stroke.  Multiple risk factors for heart disease. To see if you have HTN, your blood pressure should be measured while you are seated with your arm held at the level of the heart. It should be measured at least twice. A one-time elevated blood pressure reading (especially in the Emergency Department) does not mean that you need treatment. There may be conditions in which the blood pressure is different between your right and left arms. It is important to see your caregiver soon for a recheck. Most people have essential hypertension which means that there is not a specific cause. This type of high blood pressure may be lowered by changing lifestyle factors such as:  Stress.  Smoking.  Lack of exercise.  Excessive weight.  Drug/tobacco/alcohol use.  Eating less salt. Most people do not have symptoms from high blood pressure until it has caused damage to the body. Effective treatment can often prevent, delay or reduce that damage. TREATMENT  When a cause has been identified, treatment for high blood pressure is directed at the  cause. There are a large number of medications to treat HTN. These fall into several categories, and your caregiver will help you select the medicines that are best for you. Medications may have side effects. You should review side effects with your caregiver. If your blood pressure stays high after you have made lifestyle changes or started on medicines,   Your medication(s) may need to be changed.  Other problems may need to be addressed.  Be certain you understand your prescriptions, and know how and when to take your medicine.  Be sure to follow up with your caregiver within the time frame advised (usually within two weeks) to have your blood pressure rechecked and to review your medications.  If you are taking more than one medicine to lower your blood pressure, make sure you know how and at what times they should be taken. Taking two medicines at the same time can result in blood pressure that is too low. SEEK IMMEDIATE MEDICAL CARE IF:  You develop a severe headache, blurred or changing vision, or confusion.  You have unusual weakness or numbness, or a faint feeling.  You have severe chest or abdominal pain, vomiting, or breathing problems. MAKE SURE YOU:   Understand these instructions.  Will watch your condition.  Will get help right away if you are not doing well or get worse. Document Released: 06/22/2005 Document Revised: 09/14/2011 Document Reviewed: 02/10/2008 [facility] Patient Information 2013 [place]. Influenza A (H1N1) H1N1 formerly called "swine flu" is a new influenza virus causing sickness in  people. The H1N1 virus is different from seasonal influenza viruses. However, the H1N1 symptoms are similar to seasonal influenza and it is spread from person to person. You may be at higher risk for serious problems if you have underlying serious medical conditions. The CDC and the [facility] are following reported cases around the world. CAUSES   The  flu is thought to spread mainly person-to-person through coughing or sneezing of infected people.  A person may become infected by touching something with the virus on it and then touching their mouth or nose. SYMPTOMS   Fever.  Headache.  Tiredness.  Cough.  Sore throat.  Runny or stuffy nose.  Body aches.  Diarrhea and vomiting These symptoms are referred to as "flu-like symptoms." A lot of different illnesses, including the common cold, may have similar symptoms. DIAGNOSIS   There are tests that can tell if you have the H1N1 virus.  Confirmed cases of H1N1 will be reported to the state or local health department.  A doctor's exam may be needed to tell whether you have an infection that is a complication of the flu. HOME CARE INSTRUCTIONS   Stay informed. Visit the [facility] website for current recommendations. Visit [employer]. You may also call 1-800-CDC-INFO 250-256-8403).  Get help early if you develop any of the above symptoms.  If you are at high risk from complications of the flu, talk to your caregiver as soon as you develop flu-like symptoms. Those at higher risk for complications include:  People 65 years or older.  People with chronic medical conditions.  Pregnant women.  Young children.  Your caregiver may recommend antiviral medicine to help treat the flu.  If you get the flu, get plenty of rest, drink enough water and fluids to keep your urine clear or pale yellow, and avoid using alcohol or tobacco.  You may take over-the-counter medicine to relieve the symptoms of the flu if your caregiver approves. (Never give aspirin to children or teenagers who have flu-like symptoms, particularly fever). TREATMENT  If you do get sick, antiviral drugs are available. These drugs can make your illness milder and make you feel better faster. Treatment should start soon after illness starts. It is only effective if taken within the first day of becoming ill.  Only your caregiver can prescribe antiviral medication.  PREVENTION   Cover your nose and mouth with a tissue or your arm when you cough or sneeze. Throw the tissue away.  Wash your hands often with soap and warm water, especially after you cough or sneeze. Alcohol-based cleaners are also effective against germs.  Avoid touching your eyes, nose or mouth. This is one way germs spread.  Try to avoid contact with sick people. Follow public health advice regarding school closures. Avoid crowds.  Stay home if you get sick. Limit contact with others to keep from infecting them. People infected with the H1N1 virus may be able to infect others anywhere from 1 day before feeling sick to 5-7 days after getting flu symptoms.  An H1N1 vaccine is available to help protect against the virus. In addition to the H1N1 vaccine, you will need to be vaccinated for seasonal influenza. The H1N1 and seasonal vaccines may be given on the same day. The CDC especially recommends the H1N1 vaccine for:  Pregnant women.  People who live with or care for children younger than 20 months of age.  Health care and emergency services personnel.  Persons between the ages of 7 months through  25 years of age.  People from ages 59 through 47 years who are at higher risk for H1N1 because of chronic health disorders or immune system problems. FACEMASKS In community and home settings, the use of facemasks and N95 respirators are not normally recommended. In certain circumstances, a facemask or N95 respirator may be used for persons at increased risk of severe illness from influenza. Your caregiver can give additional recommendations for facemask use. IN CHILDREN, EMERGENCY WARNING SIGNS THAT NEED URGENT MEDICAL CARE:  Fast breathing or trouble breathing.  Bluish skin color.  Not drinking enough fluids.  Not waking up or not interacting normally.  Being so fussy that the child does not want to be held.  Your child has an  oral temperature above 102 F (38.9 C), not controlled by medicine.  Your baby is older than 3 months with a rectal temperature of 102 F (38.9 C) or higher.  Your baby is 32 months old or younger with a rectal temperature of 100.4 F (38 C) or higher.  Flu-like symptoms improve but then return with fever and worse cough. IN ADULTS, EMERGENCY WARNING SIGNS THAT NEED URGENT MEDICAL CARE:  Difficulty breathing or shortness of breath.  Pain or pressure in the chest or abdomen.  Sudden dizziness.  Confusion.  Severe or persistent vomiting.  Bluish color.  You have a oral temperature above 102 F (38.9 C), not controlled by medicine.  Flu-like symptoms improve but return with fever and worse cough. SEEK IMMEDIATE MEDICAL CARE IF:  You or someone you know is experiencing any of the above symptoms. When you arrive at the emergency center, report that you think you have the flu. You may be asked to wear a mask and/or sit in a secluded area to protect others from getting sick. MAKE SURE YOU:   Understand these instructions.  Will watch your condition.  Will get help right away if you are not doing well or get worse. Some of this information courtesy of the CDC.  Document Released: 12/09/2007 Document Revised: 09/14/2011 Document Reviewed: 12/09/2007 [facility] Patient Information 2013 [place].

## 2012-07-29 NOTE — Progress Notes (Signed)
[facility] [address] [place] [address] [phone]  Date:  07/29/2012   Name:  Dillon Perkins   DOB:  1950-09-02   MRN:  [id number]  PCP:  Jenny Reichmann, MD    Chief Complaint: Sore Throat, Cough and Follow-up   History of Present Illness:  Dillon Perkins is a 62 y.o. very pleasant male patient who presents with the following:  Ill with cough, malaise, myalgias and fatigue since yesterday, cough that is not productive.  No nausea or vomiting, wheezing or shortness of breath.  Has fever and chills. No improvement with OTC meds.    Patient Active Problem List  Diagnosis  . HYPERTENSION  . Atrial fibrillation  . Snoring  . Abnormal LFTs (liver function tests)  . Hyperlipidemia    Past Medical History  Diagnosis Date  . Atrial fibrillation   . Chest pain   . Hyperlipidemia   . Hypertension     Past Surgical History  Procedure Date  . Appendectomy     History  Substance Use Topics  . Smoking status: Former Research scientist (life sciences)  . Smokeless tobacco: Never Used  . Alcohol Use: 1.2 oz/week    2 Cans of beer per week    Family History  Problem Relation Age of Onset  . Heart disease Father   . Heart disease Sister     No Known Allergies  Medication list has been reviewed and updated.  Current Outpatient Prescriptions on File Prior to Visit  Medication Sig Dispense Refill  . aspirin EC 325 MG tablet Take 1 tablet (325 mg total) by mouth daily.  1 tablet  0  . diltiazem (CARDIZEM CD) 240 MG 24 hr capsule Take 1 capsule (240 mg total) by mouth daily.  30 capsule  6  . ibuprofen (ADVIL,MOTRIN) 200 MG tablet Take 600 mg by mouth every 6 (six) hours as needed. For pain        Review of Systems:  As per HPI, otherwise negative.    Physical Examination: Filed Vitals:   07/29/12 1338  BP: 165/93  Pulse: 72  Temp: 98.9 F (37.2 C)  Resp: 16   Filed Vitals:   07/29/12 1338  Height: 5\' 8"  (1.727 m)  Weight: 223 lb 6.4 oz (101.334 kg)    Body mass index is 33.97 kg/(m^2). Ideal Body Weight: Weight in (lb) to have BMI = 25: 164.1   GEN: WDWN, NAD, Non-toxic, A & O x 3 HEENT: Atraumatic, Normocephalic. Neck supple. No masses, No LAD. Ears and Nose: No external deformity. CV: RRR, No M/G/R. No JVD. No thrill. No extra heart sounds. PULM: CTA B, no wheezes, crackles, rhonchi. No retractions. No resp. distress. No accessory muscle use. ABD: S, NT, ND, +BS. No rebound. No HSM. EXTR: No c/c/e NEURO Normal gait.  PSYCH: Normally interactive. Conversant. Not depressed or anxious appearing.  Calm demeanor.    Assessment and Plan: Hypertension Atrial fibrillation Influenza tamiflu Follow up with Dr Everlene Farrier in 2 weeks for blood pressure check  Roselee Culver, MD  Results for orders placed in visit on 07/29/12  POCT INFLUENZA A/B      Component Value Range   Influenza A, POC Negative     Influenza B, POC Negative

## 2012-08-06 ENCOUNTER — Ambulatory Visit (INDEPENDENT_AMBULATORY_CARE_PROVIDER_SITE_OTHER): Payer: BC Managed Care – PPO | Admitting: Emergency Medicine

## 2012-08-06 ENCOUNTER — Ambulatory Visit: Payer: BC Managed Care – PPO

## 2012-08-06 VITALS — BP 171/74 | HR 70 | Temp 98.1°F | Resp 16 | Ht 67.0 in | Wt 221.0 lb

## 2012-08-06 DIAGNOSIS — R059 Cough, unspecified: Secondary | ICD-10-CM

## 2012-08-06 DIAGNOSIS — J189 Pneumonia, unspecified organism: Secondary | ICD-10-CM

## 2012-08-06 DIAGNOSIS — J029 Acute pharyngitis, unspecified: Secondary | ICD-10-CM

## 2012-08-06 DIAGNOSIS — R05 Cough: Secondary | ICD-10-CM

## 2012-08-06 LAB — POCT CBC
Granulocyte percent: 71 %G (ref 37–80)
HCT, POC: 47.2 % (ref 43.5–53.7)
Hemoglobin: 15.6 g/dL (ref 14.1–18.1)
Lymph, poc: 2 (ref 0.6–3.4)
MCH, POC: 32.5 pg — AB (ref 27–31.2)
MCHC: 33.1 g/dL (ref 31.8–35.4)
MCV: 98.4 fL — AB (ref 80–97)
MID (cbc): 0.9 (ref 0–0.9)
MPV: 8.8 fL (ref 0–99.8)
POC Granulocyte: 7.1 — AB (ref 2–6.9)
POC LYMPH PERCENT: 19.6 %L (ref 10–50)
POC MID %: 9.4 %M (ref 0–12)
Platelet Count, POC: 310 10*3/uL (ref 142–424)
RBC: 4.8 M/uL (ref 4.69–6.13)
RDW, POC: 13.5 %
WBC: 10 10*3/uL (ref 4.6–10.2)

## 2012-08-06 MED ORDER — LEVOFLOXACIN 500 MG PO TABS: 500.0000 mg | ORAL_TABLET | Freq: Every day | ORAL | Status: DC

## 2012-08-06 MED ORDER — CEFTRIAXONE SODIUM 1 G IJ SOLR: 1.0000 g | INTRAMUSCULAR | Status: DC

## 2012-08-06 MED ORDER — CEFTRIAXONE SODIUM 1 G IJ SOLR
1.0000 g | Freq: Once | INTRAMUSCULAR | Status: AC
  Administered 2012-08-06: 1 g via INTRAMUSCULAR

## 2012-08-06 MED ORDER — ALBUTEROL SULFATE (2.5 MG/3ML) 0.083% IN NEBU
2.5000 mg | INHALATION_SOLUTION | Freq: Once | RESPIRATORY_TRACT | Status: AC
  Administered 2012-08-06: 2.5 mg via RESPIRATORY_TRACT

## 2012-08-06 MED ORDER — HYDROCODONE-HOMATROPINE 5-1.5 MG/5ML PO SYRP: 5.0000 mL | ORAL_SOLUTION | Freq: Three times a day (TID) | ORAL | Status: DC | PRN

## 2012-08-06 NOTE — Progress Notes (Signed)
  Subjective:    Patient ID: Dillon Perkins, male    DOB: [birth year], 62 y.o.   MRN: [id number]  HPI  62 year old male presents with lingering cough.  Was seen here on 07/29/2012 and had a negative flu test but was given tamiflu.  Cough will not go away.  wheezing and no history of asthma.  Patient doesn't feel bad but feels like it is settled into his chest.  Coughed so bad that he feels like he is loosing oxygen.  He passed out last night from the cough.  Feels like if he could get the stuff to loosen up he would be fine.  Took the tamiflu and that helped with those symptoms.  Did not get any cough medicine.  Has had this problem in the past with cough lingering on and settling in his chest.  Has an appointment with Dr. Everlene Farrier next month.  Was sent to ER with afib.  Dr. Ouida Sills gave him bp med and Dr. Lia Foyer put him on asprin per day.    Review of Systems     Objective:   Physical Exam patient is alert cooperative does not appear in distress. His neck is supple. Chest examination reveals a few fine rales deep in the left base but the right base has rales present in the mid lung. There is a prolongation of expiration. Cardiac exam reveals a regular rate without murmurs.  [facility] reading (PRIMARY) by  Dr. Everlene Farrier chest x-ray shows a partially consolidated right middle lobe pneumonia there is also borderline cardiomegaly. Results for orders placed in visit on 08/06/12  POCT CBC      Component Value Range   WBC 10.0  4.6 - 10.2 K/uL   Lymph, poc 2.0  0.6 - 3.4   POC LYMPH PERCENT 19.6  10 - 50 %L   MID (cbc) 0.9  0 - 0.9   POC MID % 9.4  0 - 12 %M   POC Granulocyte 7.1 (*) 2 - 6.9   Granulocyte percent 71.0  37 - 80 %G   RBC 4.80  4.69 - 6.13 M/uL   Hemoglobin 15.6  14.1 - 18.1 g/dL   HCT, POC 47.2  43.5 - 53.7 %   MCV 98.4 (*) 80 - 97 fL   MCH, POC 32.5 (*) 27 - 31.2 pg   MCHC 33.1  31.8 - 35.4 g/dL   RDW, POC 13.5     Platelet Count, POC 310  142 - 424 K/uL   MPV 8.8  0 - 99.8 fL          Assessment & Plan:  Patient has right middle lobe pneumonia by chest x-ray. His white count is elevated at 10,000. We'll give a gram of Rocephin and treat with Levaquin 500 per day he was given Hycodan for cough. He is to take Mucinex twice a day. He did not have any real improvement with his albuterol treatment.

## 2012-08-06 NOTE — Patient Instructions (Addendum)
Pneumonia, Adult °Pneumonia is an infection of the lungs.  °CAUSES °Pneumonia may be caused by bacteria or a virus. Usually, these infections are caused by breathing infectious particles into the lungs (respiratory tract). °SYMPTOMS  °· Cough. °· Fever. °· Chest pain. °· Increased rate of breathing. °· Wheezing. °· Mucus production. °DIAGNOSIS  °If you have the common symptoms of pneumonia, your caregiver will typically confirm the diagnosis with a chest X-ray. The X-ray will show an abnormality in the lung (pulmonary infiltrate) if you have pneumonia. Other tests of your blood, urine, or sputum may be done to find the specific cause of your pneumonia. Your caregiver may also do tests (blood gases or pulse oximetry) to see how well your lungs are working. °TREATMENT  °Some forms of pneumonia may be spread to other people when you cough or sneeze. You may be asked to wear a mask before and during your exam. Pneumonia that is caused by bacteria is treated with antibiotic medicine. Pneumonia that is caused by the influenza virus may be treated with an antiviral medicine. Most other viral infections must run their course. These infections will not respond to antibiotics.  °PREVENTION °A pneumococcal shot (vaccine) is available to prevent a common bacterial cause of pneumonia. This is usually suggested for: °· People over 65 years old. °· Patients on chemotherapy. °· People with chronic lung problems, such as bronchitis or emphysema. °· People with immune system problems. °If you are over 65 or have a high risk condition, you may receive the pneumococcal vaccine if you have not received it before. In some countries, a routine influenza vaccine is also recommended. This vaccine can help prevent some cases of pneumonia. You may be offered the influenza vaccine as part of your care. °If you smoke, it is time to quit. You may receive instructions on how to stop smoking. Your caregiver can provide medicines and counseling to  help you quit. °HOME CARE INSTRUCTIONS  °· Cough suppressants may be used if you are losing too much rest. However, coughing protects you by clearing your lungs. You should avoid using cough suppressants if you can. °· Your caregiver may have prescribed medicine if he or she thinks your pneumonia is caused by a bacteria or influenza. Finish your medicine even if you start to feel better. °· Your caregiver may also prescribe an expectorant. This loosens the mucus to be coughed up. °· Only take over-the-counter or prescription medicines for pain, discomfort, or fever as directed by your caregiver. °· Do not smoke. Smoking is a common cause of bronchitis and can contribute to pneumonia. If you are a smoker and continue to smoke, your cough may last several weeks after your pneumonia has cleared. °· A cold steam vaporizer or humidifier in your room or home may help loosen mucus. °· Coughing is often worse at night. Sleeping in a semi-upright position in a recliner or using a couple pillows under your head will help with this. °· Get rest as you feel it is needed. Your body will usually let you know when you need to rest. °SEEK IMMEDIATE MEDICAL CARE IF:  °· Your illness becomes worse. This is especially true if you are elderly or weakened from any other disease. °· You cannot control your cough with suppressants and are losing sleep. °· You begin coughing up blood. °· You develop pain which is getting worse or is uncontrolled with medicines. °· You have a fever. °· Any of the symptoms which initially brought you in for treatment   You begin coughing up blood.   You develop pain which is getting worse or is uncontrolled with medicines.   You have a fever.   Any of the symptoms which initially brought you in for treatment are getting worse rather than better.   You develop shortness of breath or chest pain.  MAKE SURE YOU:    Understand these instructions.   Will watch your condition.   Will get help right away if you are not doing well or get worse.  Document Released: 06/22/2005 Document Revised: 09/14/2011 Document Reviewed: 09/11/2010  ExitCare Patient Information 2013 [employer].

## 2012-08-08 ENCOUNTER — Ambulatory Visit (INDEPENDENT_AMBULATORY_CARE_PROVIDER_SITE_OTHER): Payer: BC Managed Care – PPO | Admitting: Emergency Medicine

## 2012-08-08 ENCOUNTER — Ambulatory Visit: Payer: BC Managed Care – PPO

## 2012-08-08 VITALS — BP 138/83 | HR 70 | Temp 98.1°F | Resp 18 | Ht 68.0 in | Wt 222.0 lb

## 2012-08-08 DIAGNOSIS — J189 Pneumonia, unspecified organism: Secondary | ICD-10-CM

## 2012-08-08 NOTE — Progress Notes (Signed)
  Subjective:    Patient ID: Dillon Perkins, male    DOB: 17-Sep-1950, 62 y.o.   MRN: [id number]  HPI 62 year old male following up on RLL pneumonia. Coughing up reddish brown sputum, feels like medication is working for patient.  No fever.  chills on 08/06/12.    94% SpO2 with recheck at 8:11am  Review of Systems     Objective:   Physical Exam is alert cooperative and conversive. He is in no distress. Chest exam reveals rales present in the mid lung on the right. Breath sounds are symmetrical. Heart was regular rate and rhythm. He is in no respiratory distress  [facility] reading (PRIMARY) by  Dr. Everlene Farrier  is a persistent right lower lobe versus right middle lobe infiltrate. No change from previous chest x-ray. There is borderline cardiomegaly.        Assessment & Plan:  Patient stable at present. We'll continue current medications. We'll recheck next Sunday to see if he is clear for work.

## 2012-08-11 ENCOUNTER — Telehealth: Payer: Self-pay

## 2012-08-11 DIAGNOSIS — R05 Cough: Secondary | ICD-10-CM

## 2012-08-11 DIAGNOSIS — R059 Cough, unspecified: Secondary | ICD-10-CM

## 2012-08-11 MED ORDER — HYDROCOD POLST-CHLORPHEN POLST 10-8 MG/5ML PO LQCR: 5.0000 mL | Freq: Two times a day (BID) | ORAL | Status: DC | PRN

## 2012-08-11 NOTE — Telephone Encounter (Signed)
[place] ISN'T DURING TO GOOD AND WOULD LIKE TO SPEAK WITH DR DAUB OR HIS NURSE REGARDING HIM [employer] [phone]

## 2012-08-11 NOTE — Telephone Encounter (Signed)
Judson Roch gave me verbal order to call in pended RX. Called in to [facility]

## 2012-08-11 NOTE — Telephone Encounter (Signed)
Called patients wife, and he is coughing at night, he coughs so hard he passed out last pm. States this happened x2  He is taking the Levaquin and the cough meds at night. He wakes up several hours after taking the cough meds and this is when the coughing episodes occur. I have discussed this with Windell Hummingbird, PA and patient may need a longer acting cough medication. I have advised his wife, she states if he is not better by tomorrow she will bring him back in. Pended the Tussionex/ per our conversation, pls print and sign I will send to pharmacy. Amy

## 2012-08-14 ENCOUNTER — Ambulatory Visit: Payer: BC Managed Care – PPO

## 2012-08-14 ENCOUNTER — Ambulatory Visit (INDEPENDENT_AMBULATORY_CARE_PROVIDER_SITE_OTHER): Payer: BC Managed Care – PPO | Admitting: Emergency Medicine

## 2012-08-14 VITALS — BP 138/83 | HR 65 | Temp 99.2°F | Resp 18 | Ht 68.0 in | Wt 219.0 lb

## 2012-08-14 DIAGNOSIS — R05 Cough: Secondary | ICD-10-CM

## 2012-08-14 DIAGNOSIS — T148XXA Other injury of unspecified body region, initial encounter: Secondary | ICD-10-CM

## 2012-08-14 DIAGNOSIS — R059 Cough, unspecified: Secondary | ICD-10-CM

## 2012-08-14 LAB — POCT CBC
Granulocyte percent: 67.6 %G (ref 37–80)
HCT, POC: 39.6 % — AB (ref 43.5–53.7)
Hemoglobin: 13.4 g/dL — AB (ref 14.1–18.1)
Lymph, poc: 1.8 (ref 0.6–3.4)
MCH, POC: 33.3 pg — AB (ref 27–31.2)
MCHC: 33.8 g/dL (ref 31.8–35.4)
MCV: 98.4 fL — AB (ref 80–97)
MID (cbc): 0.5 (ref 0–0.9)
MPV: 8.5 fL (ref 0–99.8)
POC Granulocyte: 4.9 (ref 2–6.9)
POC LYMPH PERCENT: 25 %L (ref 10–50)
POC MID %: 7.4 %M (ref 0–12)
Platelet Count, POC: 281 10*3/uL (ref 142–424)
RBC: 4.02 M/uL — AB (ref 4.69–6.13)
RDW, POC: 13.3 %
WBC: 7.3 10*3/uL (ref 4.6–10.2)

## 2012-08-14 MED ORDER — MUPIROCIN 2 % EX OINT: TOPICAL_OINTMENT | Freq: Three times a day (TID) | CUTANEOUS | Status: DC

## 2012-08-14 NOTE — Progress Notes (Signed)
  Subjective:    Patient ID: Dillon Perkins, male    DOB: June 23, 1951, 62 y.o.   MRN: [id number]  HPI patient states he is feeling better. He still has shortness of breath when he exerts himself. He is staying in the house only. He's not coughing up much phlegm. Thursday night he had an episode of severe coughing and had a syncopal episode where he struck his face. He sustained abrasions of his nose and lip . He had no chest pain    Review of Systems     Objective:   Physical Exam Dillon Perkins looks better today. His neck is supple. Exam reveals rales present in both lower. His cardiac exam reveals a regular rate without murmurs or gallops. Abdomen is soft without tenderness or masses. [facility] reading (PRIMARY) by  Dr. Everlene Farrier the infiltrate in the right lower lobe seems to be improving.        Assessment & Plan:  The radiologist felt his last chest x-ray was worsening on his last film.Dillon Perkins check his film today for followup. He is currently on Levaquin and Hycodan. Chest x-ray is improving. We'll continue the course recheck on Friday I did speak to the radiologist who did  feel there is improvement in his chest x-ray.

## 2012-08-20 ENCOUNTER — Other Ambulatory Visit: Payer: Self-pay

## 2012-08-21 ENCOUNTER — Ambulatory Visit: Payer: BC Managed Care – PPO

## 2012-08-21 ENCOUNTER — Ambulatory Visit (INDEPENDENT_AMBULATORY_CARE_PROVIDER_SITE_OTHER): Payer: BC Managed Care – PPO | Admitting: Family Medicine

## 2012-08-21 VITALS — BP 144/90 | HR 69 | Temp 97.5°F | Resp 18 | Ht 67.0 in | Wt 215.0 lb

## 2012-08-21 DIAGNOSIS — R059 Cough, unspecified: Secondary | ICD-10-CM

## 2012-08-21 DIAGNOSIS — R05 Cough: Secondary | ICD-10-CM

## 2012-08-21 DIAGNOSIS — J189 Pneumonia, unspecified organism: Secondary | ICD-10-CM

## 2012-08-21 DIAGNOSIS — R062 Wheezing: Secondary | ICD-10-CM

## 2012-08-21 DIAGNOSIS — J159 Unspecified bacterial pneumonia: Secondary | ICD-10-CM

## 2012-08-21 DIAGNOSIS — J158 Pneumonia due to other specified bacteria: Secondary | ICD-10-CM

## 2012-08-21 MED ORDER — HYDROCOD POLST-CHLORPHEN POLST 10-8 MG/5ML PO LQCR: 5.0000 mL | Freq: Two times a day (BID) | ORAL | Status: DC | PRN

## 2012-08-21 NOTE — Patient Instructions (Signed)
Continue mucinex during the day, tussionex if needed at night, and follow up as planned with Dr. Everlene Farrier.  Return to the clinic or go to the nearest emergency room if any of your symptoms worsen or new symptoms occur, including any worsening of the sore throat.

## 2012-08-21 NOTE — Progress Notes (Signed)
Subjective:    Patient ID: Dillon Perkins, male    DOB: Feb 21, 1951, 62 y.o.   MRN: [id number]  HPI Dillon Perkins is a 62 y.o. male Dx with RML vs RLL PNA 08/06/12,  Treated with rocephin 1 gram, levaquin 500mg  qd for 10 days, and hycodan syrup. Possible initial worsening of CXR, then improved at 08/14/12 ov.   Still feels better. Slight sore throat  and cough last night, but clear fluids with cough.  Still taking mucinex. Out of cough syrup - felt like tussionex worked better. Resting, out of work past 2 weeks. Released to work tomorrow.  Works at [employer] - seated work most of the time. No known fever. Not feeling short of breath.   CXR report 08/14/12: Comparison: Plain films 08/08/2012, 08/06/2012  Findings: Normal mediastinum and heart silhouette. There is  persistent focus of pneumonia in the right lower lobe which is  decrease slightly in density compared to prior. No new infiltrate.  IMPRESSION:  Mild improvement in right lower lobe pneumonia. Recommend follow-  up chest x-ray in 4 to 6 weeks to ensure resolution.  Clinically significant discrepancy from primary report, if  provided: None  Review of Systems  Constitutional: Negative for fever and chills.  HENT: Positive for sore throat.   Respiratory: Positive for cough. Negative for shortness of breath.   Cardiovascular: Negative for leg swelling.  Skin: Negative for rash.  Neurological: Negative for syncope and light-headedness.       Objective:   Physical Exam  Constitutional: He is oriented to person, place, and time. He appears well-developed and well-nourished.  HENT:  Head: Normocephalic and atraumatic.  Right Ear: Tympanic membrane, external ear and ear canal normal.  Left Ear: Tympanic membrane, external ear and ear canal normal.  Nose: No rhinorrhea.  Mouth/Throat: Oropharynx is clear and moist and mucous membranes are normal. No oropharyngeal exudate or posterior oropharyngeal erythema.  Eyes: Conjunctivae  are normal. Pupils are equal, round, and reactive to light.  Neck: Neck supple.  Cardiovascular: Normal rate, regular rhythm, normal heart sounds and intact distal pulses.   No murmur heard. Pulmonary/Chest: Effort normal. He has no wheezes. He has rhonchi in the right lower field. He has no rales.    Abdominal: Soft. There is no tenderness.  Lymphadenopathy:    He has no cervical adenopathy.  Neurological: He is alert and oriented to person, place, and time.  Skin: Skin is warm and dry. No rash noted.  Psychiatric: He has a normal mood and affect. His behavior is normal.   [facility] reading (PRIMARY) by  Dr. Carlota Raspberry: CXR: improving/resolving RLL PNA.       Assessment & Plan:  Dillon Perkins is a 62 y.o. male  Pneumonia - Plan: DG Chest 2 View.  Improved cxr, afebrile, clinically and radiographically improved.  Ok to rtw tomorrow, but if any increase in sx's - may need further time off.  Has follow up in 2 weeks with Dr. Everlene Farrier - keep that appt, rtc sooner if any worsening.   Meds ordered this encounter  Medications  . chlorpheniramine-HYDROcodone (TUSSIONEX PENNKINETIC ER) 10-8 MG/5ML LQCR    Sig: Take 5 mLs by mouth every 12 (twelve) hours as needed.    Dispense:  115 mL    Refill:  0   Patient Instructions  Continue mucinex during the day, tussionex if needed at night, and follow up as planned with Dr. Everlene Farrier.  Return to the clinic or go to the nearest emergency room if  any of your symptoms worsen or new symptoms occur, including any worsening of the sore throat.

## 2012-09-06 ENCOUNTER — Ambulatory Visit: Payer: BC Managed Care – PPO

## 2012-09-06 ENCOUNTER — Ambulatory Visit (INDEPENDENT_AMBULATORY_CARE_PROVIDER_SITE_OTHER): Payer: BC Managed Care – PPO | Admitting: Emergency Medicine

## 2012-09-06 ENCOUNTER — Encounter: Payer: Self-pay | Admitting: Emergency Medicine

## 2012-09-06 VITALS — BP 166/88 | HR 58 | Temp 97.9°F | Resp 18 | Ht 67.0 in | Wt 216.0 lb

## 2012-09-06 DIAGNOSIS — I10 Essential (primary) hypertension: Secondary | ICD-10-CM

## 2012-09-06 DIAGNOSIS — R05 Cough: Secondary | ICD-10-CM

## 2012-09-06 DIAGNOSIS — R9389 Abnormal findings on diagnostic imaging of other specified body structures: Secondary | ICD-10-CM

## 2012-09-06 DIAGNOSIS — I4891 Unspecified atrial fibrillation: Secondary | ICD-10-CM

## 2012-09-06 DIAGNOSIS — R059 Cough, unspecified: Secondary | ICD-10-CM

## 2012-09-06 MED ORDER — DILTIAZEM HCL ER COATED BEADS 240 MG PO CP24: 240.0000 mg | ORAL_CAPSULE | Freq: Every day | ORAL | Status: DC

## 2012-09-06 MED ORDER — METOPROLOL SUCCINATE ER 50 MG PO TB24: 50.0000 mg | ORAL_TABLET | Freq: Every day | ORAL | Status: DC

## 2012-09-06 NOTE — Progress Notes (Signed)
  Subjective:    Patient ID: Dillon Perkins, male    DOB: [birth year], 62 y.o.   MRN: [id number]  HPI patient in for recheck of his right lower lobe pneumonia. He's done well with his alcohol intake and has decreased this. He is trying to eat Healthy. He has returned to work and is feeling better. He has not seen Dr. Lia Foyer recently. He does have a significant family history for heart disease    Review of Systems     Objective:   Physical Exam patient is alert and cooperative in no distress. His neck is supple. Chest exam reveals dry rub in the right base . [facility] reading (PRIMARY) by  Dr. Everlene Farrier there is some scarring residual in the right lower lobe. No definite mass lesion to        Assessment & Plan:  Patient's persistent right lower lobe rub where he had his pneumonia. We'll check CT chest no contrast for evaluation. Referral to  be made to Dr. Lia Foyer for his evaluation .

## 2012-09-08 ENCOUNTER — Ambulatory Visit
Admission: RE | Admit: 2012-09-08 | Discharge: 2012-09-08 | Disposition: A | Discharge status: Home / Self Care | Payer: BC Managed Care – PPO | Source: Ambulatory Visit | Attending: Emergency Medicine | Admitting: Emergency Medicine

## 2012-09-08 DIAGNOSIS — R9389 Abnormal findings on diagnostic imaging of other specified body structures: Secondary | ICD-10-CM

## 2012-09-14 ENCOUNTER — Telehealth: Payer: Self-pay | Admitting: Radiology

## 2012-09-14 DIAGNOSIS — R9389 Abnormal findings on diagnostic imaging of other specified body structures: Secondary | ICD-10-CM

## 2012-09-16 NOTE — Telephone Encounter (Signed)
Sent to referral

## 2012-09-20 ENCOUNTER — Telehealth: Payer: Self-pay

## 2012-09-20 DIAGNOSIS — R9389 Abnormal findings on diagnostic imaging of other specified body structures: Secondary | ICD-10-CM

## 2012-09-20 NOTE — Telephone Encounter (Signed)
Pt is under the empression that he is supposed to be getting a referral to pulmonary and referrals does not see anything in patients chart  [id number]

## 2012-09-20 NOTE — Telephone Encounter (Signed)
Put in referral. Dr L tried to do this on 3/12 unsure where this went.

## 2012-09-21 ENCOUNTER — Encounter: Payer: Self-pay | Admitting: Cardiology

## 2012-09-21 ENCOUNTER — Ambulatory Visit (INDEPENDENT_AMBULATORY_CARE_PROVIDER_SITE_OTHER): Payer: BC Managed Care – PPO | Admitting: Cardiology

## 2012-09-21 VITALS — BP 116/76 | HR 53 | Ht 68.0 in | Wt 214.0 lb

## 2012-09-21 DIAGNOSIS — R945 Abnormal results of liver function studies: Secondary | ICD-10-CM

## 2012-09-21 DIAGNOSIS — I4891 Unspecified atrial fibrillation: Secondary | ICD-10-CM

## 2012-09-21 DIAGNOSIS — R7989 Other specified abnormal findings of blood chemistry: Secondary | ICD-10-CM

## 2012-09-21 DIAGNOSIS — R9389 Abnormal findings on diagnostic imaging of other specified body structures: Secondary | ICD-10-CM

## 2012-09-21 DIAGNOSIS — I10 Essential (primary) hypertension: Secondary | ICD-10-CM

## 2012-09-21 DIAGNOSIS — R6889 Other general symptoms and signs: Secondary | ICD-10-CM

## 2012-09-21 DIAGNOSIS — R918 Other nonspecific abnormal finding of lung field: Secondary | ICD-10-CM

## 2012-09-21 NOTE — Patient Instructions (Addendum)
Your physician has requested that you have an exercise tolerance test with Dr Lia Foyer the week of April 15th. For further information please visit [email or web address]. Please also follow instruction sheet, as given.

## 2012-09-21 NOTE — Progress Notes (Signed)
   HPI:  This patient comes in today and we reviewed a number of things. He's had a prior sleep study. He had paroxysmal atrial fibrillation, and is not aware of any recurrence. He is on aspirin for thrombotic prophylaxis. He's not having chest pain, but has had a fair amount of exercise intolerance noted since an episode of pneumonia. He has an abnormal chest x-ray followed by an abnormal CT scan, a followup CT scan in 3 months is been recommended. He's being followed by Dr. Everlene Farrier.  No new symptoms at the present time.     Current Outpatient Prescriptions  Medication Sig Dispense Refill  . aspirin 81 MG tablet Take 81 mg by mouth daily.      Marland Kitchen diltiazem (CARDIZEM CD) 240 MG 24 hr capsule Take 1 capsule (240 mg total) by mouth daily.  30 capsule  6  . ibuprofen (ADVIL,MOTRIN) 200 MG tablet Take 600 mg by mouth every 6 (six) hours as needed. For pain      . metoprolol succinate (TOPROL-XL) 50 MG 24 hr tablet Take 1 tablet (50 mg total) by mouth daily. Take with or immediately following a meal.  30 tablet  3   No current facility-administered medications for this visit.    No Known Allergies  Past Medical History  Diagnosis Date  . Atrial fibrillation   . Chest pain   . Hyperlipidemia   . Hypertension     Past Surgical History  Procedure Laterality Date  . Appendectomy      Family History  Problem Relation Age of Onset  . Heart disease Father   . Heart disease Sister     History   Social History  . Marital Status: Married    Spouse Name: N/A    Number of Children: N/A  . Years of Education: N/A   Occupational History  . Not on file.   Social History Main Topics  . Smoking status: Former Smoker    Quit date: 08/21/1972  . Smokeless tobacco: Never Used  . Alcohol Use: 1.2 oz/week    2 Cans of beer per week  . Drug Use: No  . Sexually Active: Not on file   Other Topics Concern  . Not on file   Social History Narrative  . No narrative on file    ROS: Please see  the HPI.  All other systems reviewed and negative.  PHYSICAL EXAM:  BP 116/76  Pulse 53  Ht 5\' 8"  (1.727 m)  Wt 214 lb (97.07 kg)  BMI 32.55 kg/m2  SpO2 98%  General: Well developed, well nourished, in no acute distress. Head:  Normocephalic and atraumatic. Neck: no JVD Lungs: Clear to auscultation and percussion. Heart: Normal S1 and S2.  No murmur, rubs or gallops.  Abdomen:  Normal bowel sounds; soft; non tender; no organomegaly Pulses: Pulses normal in all 4 extremities. Extremities: No clubbing or cyanosis. No edema. Neurologic: Alert and oriented x 3.  EKG:NSR.  WNL.   ASSESSMENT AND PLAN:  1.  PAF  -  No recurrence CHADS 1  -- on ASA.  2.  Exercise intolerance  -- plan for GXT.  3.  Abnormal CXR/CT  --  Needs fu.  He will see Dr .Everlene Farrier 4.  Abnormal LFTs.  See prior note on this.

## 2012-09-23 ENCOUNTER — Encounter: Payer: Self-pay | Admitting: Internal Medicine

## 2012-09-23 ENCOUNTER — Ambulatory Visit (INDEPENDENT_AMBULATORY_CARE_PROVIDER_SITE_OTHER): Payer: BC Managed Care – PPO | Admitting: Internal Medicine

## 2012-09-23 VITALS — BP 120/68 | HR 50 | Temp 97.5°F | Ht 68.0 in | Wt 217.0 lb

## 2012-09-23 DIAGNOSIS — R9389 Abnormal findings on diagnostic imaging of other specified body structures: Secondary | ICD-10-CM

## 2012-09-23 DIAGNOSIS — R918 Other nonspecific abnormal finding of lung field: Secondary | ICD-10-CM

## 2012-09-23 NOTE — Progress Notes (Addendum)
  Subjective:    Patient ID: Dillon Perkins, male    DOB: 03-14-1951  MRN: [id number]  HPI  31 yowm quit smoking 1974 and no resp problems at all Feb 2014 with dx pna with persistent cough so referred 09/23/2012 to pulmonary clinic by Dr Wilhelmina Mcardle for pulmonary evaluation.   09/23/2012 1st pulmonary eval cc acutely 07/28/12  dry cough, sore throat, some aches, no high fever  rx abx > at one point coughing up amber/ pink mucus with green > all cleared with residual min cough at hs by day of ov but chest ct abnormal so Dr Everlene Farrier requested eval.   No obvious daytime variabilty or assoc sob or cp or chest tightness, subjective wheeze overt sinus or hb symptoms. No unusual exp hx or h/o childhood pna/ asthma or premature birth to his knowledge.   Sleeping ok without nocturnal  or early am exacerbation  of respiratory  c/o's or need for noct saba. Also denies any obvious fluctuation of symptoms with weather or environmental changes or other aggravating or alleviating factors except as outlined above    Review of Systems  Constitutional: Negative for fever, chills, activity change, appetite change and unexpected weight change.  HENT: Negative for congestion, sore throat, rhinorrhea, sneezing, trouble swallowing, dental problem, voice change and postnasal drip.   Eyes: Negative for visual disturbance.  Respiratory: Positive for cough. Negative for choking and shortness of breath.   Cardiovascular: Negative for chest pain and leg swelling.  Gastrointestinal: Negative for nausea, vomiting and abdominal pain.  Genitourinary: Negative for difficulty urinating.  Musculoskeletal: Negative for arthralgias.  Skin: Negative for rash.  Psychiatric/Behavioral: Negative for behavioral problems and confusion.       Objective:   Physical Exam  Wt Readings from Last 3 Encounters:  09/23/12 217 lb (98.431 kg)  09/21/12 214 lb (97.07 kg)  09/06/12 216 lb (97.977 kg)    HEENT: nl dentition,  turbinates, and orophanx. Nl external ear canals without cough reflex   NECK :  without JVD/Nodes/TM/ nl carotid upstrokes bilaterally   LUNGS: no acc muscle use, clear to A and P bilaterally without cough on insp or exp maneuvers   CV:  RRR  no s3 or murmur or increase in P2, no edema   ABD:  soft and nontender with nl excursion in the supine position. No bruits or organomegaly, bowel sounds nl  MS:  warm without deformities, calf tenderness, cyanosis or clubbing  SKIN: warm and dry without lesions    NEURO:  alert, approp, no deficits    Ct chest 09/06/12 1. Parenchymal opacity in the right lower lobe posteriorly and to  a lesser degree in the right middle lobe most consistent with  residual pneumonia.  2. However there is a nodular opacity deep in the posterior right  lower lobe lung sulcus, and an underlying neoplasm cannot be  excluded. Recommend follow-up CT the chest in 3 months to assess  resolution.  3. No mediastinal or hilar adenopathy.  4. Somewhat age advanced coronary artery calcifications.  5. Small gallstones.  6. Small nonobstructing left renal calculus.        Assessment & Plan:

## 2012-09-23 NOTE — Patient Instructions (Addendum)
All I recommend is a Ct scan in 6 months unless your symptoms relapse (placed in tickle file for recall 03/09/13)

## 2012-09-24 DIAGNOSIS — R6889 Other general symptoms and signs: Secondary | ICD-10-CM | POA: Insufficient documentation

## 2012-09-24 NOTE — Assessment & Plan Note (Signed)
Controlled.  

## 2012-09-24 NOTE — Assessment & Plan Note (Signed)
See wu.  Chads 1.  Currently no recurrence.

## 2012-09-24 NOTE — Assessment & Plan Note (Signed)
Discussed. These will need to be repeated and he is aware of that need.  He is seeing Dr. Everlene Farrier.

## 2012-09-24 NOTE — Assessment & Plan Note (Signed)
Continuing FU with Dr. Everlene Farrier, and will see Wert.

## 2012-09-24 NOTE — Assessment & Plan Note (Signed)
Serial cxr's strongly support an appropriately treated resolving RLL CAP - to also speculate that in the exact same area of the lung there is a second peripheral process (not a central obstructing lesion, which might be connected) is real reach statistically speaking and would not pursue a tissue dx at this point - I do recommend a f/u ct at 6 months and sooner if any of his previous symptoms recur in meantime  Discussed in detail all the  indications, usual  risks and alternatives  relative to the benefits with patient who agrees to proceed with conservative f/u as outlined.  See instructions for specific recommendations which were reviewed directly with the patient who was given a copy with highlighter outlining the key components.

## 2012-09-24 NOTE — Assessment & Plan Note (Signed)
He would like a GXT and we will make those arrangements.

## 2012-10-19 ENCOUNTER — Telehealth: Payer: Self-pay | Admitting: *Deleted

## 2012-10-19 ENCOUNTER — Encounter: Payer: BC Managed Care – PPO | Admitting: Cardiology

## 2012-10-19 NOTE — Telephone Encounter (Signed)
Pt No Show for ETT on 10/19/12. TK

## 2012-12-13 ENCOUNTER — Ambulatory Visit: Payer: BC Managed Care – PPO | Admitting: Emergency Medicine

## 2013-03-07 ENCOUNTER — Telehealth: Payer: Self-pay | Admitting: *Deleted

## 2013-03-07 NOTE — Telephone Encounter (Signed)
Needs ct chest to f/u on past abnormal ct chest  LMTCB

## 2013-03-07 NOTE — Telephone Encounter (Signed)
Message copied by Rosana Berger on Tue Mar 07, 2013 12:30 PM ------      Message from: Christinia Gully B      Created: Fri Sep 23, 2012  2:18 PM       Be sure he had ct scan ------

## 2013-03-08 NOTE — Telephone Encounter (Signed)
LMTCB x2  

## 2013-03-10 NOTE — Telephone Encounter (Signed)
lmtcb x3 

## 2013-03-11 ENCOUNTER — Other Ambulatory Visit: Payer: Self-pay | Admitting: Emergency Medicine

## 2013-04-04 ENCOUNTER — Encounter: Payer: Self-pay | Admitting: Internal Medicine

## 2013-04-04 NOTE — Telephone Encounter (Signed)
Will mail the pt a letter

## 2013-04-04 NOTE — Telephone Encounter (Signed)
lmtcb x4 for pt. Will forward to [place] as this is 4th attempt to call

## 2013-05-11 ENCOUNTER — Other Ambulatory Visit: Payer: Self-pay

## 2013-07-29 ENCOUNTER — Other Ambulatory Visit: Payer: Self-pay | Admitting: Emergency Medicine

## 2013-09-13 ENCOUNTER — Other Ambulatory Visit: Payer: Self-pay | Admitting: Emergency Medicine

## 2013-09-13 ENCOUNTER — Other Ambulatory Visit: Payer: Self-pay | Admitting: Physician Assistant

## 2013-09-13 NOTE — Telephone Encounter (Signed)
Pt will RTC tomorrow.

## 2013-09-14 ENCOUNTER — Encounter: Payer: Self-pay | Admitting: Gastroenterology

## 2013-09-14 ENCOUNTER — Ambulatory Visit (INDEPENDENT_AMBULATORY_CARE_PROVIDER_SITE_OTHER): Payer: BC Managed Care – PPO | Admitting: Emergency Medicine

## 2013-09-14 ENCOUNTER — Other Ambulatory Visit: Payer: Self-pay | Admitting: Emergency Medicine

## 2013-09-14 VITALS — BP 120/80 | HR 60 | Temp 98.4°F | Resp 16 | Ht 67.0 in | Wt 226.0 lb

## 2013-09-14 DIAGNOSIS — I4891 Unspecified atrial fibrillation: Secondary | ICD-10-CM

## 2013-09-14 DIAGNOSIS — I10 Essential (primary) hypertension: Secondary | ICD-10-CM

## 2013-09-14 DIAGNOSIS — Z1211 Encounter for screening for malignant neoplasm of colon: Secondary | ICD-10-CM

## 2013-09-14 LAB — POCT CBC
Granulocyte percent: 55 %G (ref 37–80)
HCT, POC: 47.5 % (ref 43.5–53.7)
Hemoglobin: 15.5 g/dL (ref 14.1–18.1)
Lymph, poc: 2.2 (ref 0.6–3.4)
MCH, POC: 33.6 pg — AB (ref 27–31.2)
MCHC: 32.6 g/dL (ref 31.8–35.4)
MCV: 103 fL — AB (ref 80–97)
MID (cbc): 0.5 (ref 0–0.9)
MPV: 9.7 fL (ref 0–99.8)
POC Granulocyte: 3.2 (ref 2–6.9)
POC LYMPH PERCENT: 36.5 %L (ref 10–50)
POC MID %: 8.5 %M (ref 0–12)
Platelet Count, POC: 178 10*3/uL (ref 142–424)
RBC: 4.61 M/uL — AB (ref 4.69–6.13)
RDW, POC: 14.2 %
WBC: 5.9 10*3/uL (ref 4.6–10.2)

## 2013-09-14 LAB — LIPID PANEL
Cholesterol: 179 mg/dL (ref 0–200)
HDL: 48 mg/dL (ref >39)
LDL Cholesterol: 109 mg/dL — ABNORMAL HIGH (ref 0–99)
Total CHOL/HDL Ratio: 3.7 Ratio
Triglycerides: 110 mg/dL (ref <150)
VLDL: 22 mg/dL (ref 0–40)

## 2013-09-14 LAB — COMPREHENSIVE METABOLIC PANEL
ALT: 48 U/L (ref 0–53)
AST: 41 U/L — ABNORMAL HIGH (ref 0–37)
Albumin: 3.8 g/dL (ref 3.5–5.2)
Alkaline Phosphatase: 60 U/L (ref 39–117)
BUN: 17 mg/dL (ref 6–23)
CO2: 24 mEq/L (ref 19–32)
Calcium: 8.6 mg/dL (ref 8.4–10.5)
Chloride: 105 mEq/L (ref 96–112)
Creat: 1.07 mg/dL (ref 0.50–1.35)
Glucose, Bld: 98 mg/dL (ref 70–99)
Potassium: 4.5 mEq/L (ref 3.5–5.3)
Sodium: 138 mEq/L (ref 135–145)
Total Bilirubin: 0.6 mg/dL (ref 0.2–1.2)
Total Protein: 7.2 g/dL (ref 6.0–8.3)

## 2013-09-14 LAB — TSH: TSH: 3.139 u[IU]/mL (ref 0.350–4.500)

## 2013-09-14 LAB — PSA: PSA: 2 ng/mL (ref <=4.00)

## 2013-09-14 LAB — IFOBT (OCCULT BLOOD): IFOBT: NEGATIVE

## 2013-09-14 MED ORDER — DILTIAZEM HCL ER COATED BEADS 240 MG PO CP24: ORAL_CAPSULE | ORAL | Status: DC

## 2013-09-14 MED ORDER — METOPROLOL SUCCINATE ER 50 MG PO TB24: ORAL_TABLET | ORAL | Status: DC

## 2013-09-14 NOTE — Progress Notes (Addendum)
   Subjective:   Patient ID: Dillon Perkins, male    DOB: [birth year], 63 y.o.   MRN: [id number] This chart was scribed for Arlyss Queen, MD by Terressa Koyanagi, ED Scribe. This patient was seen in room 11 and the patient's care was started at 8:14AM.    PCP: Jenny Reichmann, MD  HPI HPI Comments: Dillon Perkins is a 63 y.o. male, with a history of HTN, Afib, HLD, and abnormal LFTs, who presents to the [facility] requesting a refill of his meds: Cardizem and Metoprolol Succinate. Pt reports he has been somewhat compliant with his blood pressure meds; he reports he has been taking his meds "when he has it." Pt complains of being overweight and hopes to lose weight. He reports he is currently 225lbs but wishes to be approximately 180lbs. Pt reports that his work requires him to walk approximately 2 miles a day, however, denies any additional exercise. Pt also reports that he is not up to date with his colonoscopy or stress test. Pt states it has been approximately 10 years since his last stress test.   Pt states he is a non-smoker and occasional drinker.    Review of Systems  Constitutional: Negative for fatigue and unexpected weight change.       Pt is overweight  Eyes: Negative for visual disturbance.  Respiratory: Negative for cough, chest tightness and shortness of breath.   Cardiovascular: Negative for chest pain, palpitations and leg swelling.  Gastrointestinal: Negative for abdominal pain and blood in stool.  Neurological: Negative for dizziness, light-headedness and headaches.   Objective:  Physical Exam CONSTITUTIONAL: Well developed/well nourished HEAD: Normocephalic/atraumatic EYES: EOMI/PERRL ENMT: Mucous membranes moist NECK: supple no meningeal signs SPINE:entire spine nontender CV: S1/S2 noted, no murmurs/rubs/gallops noted LUNGS: Lungs are clear to auscultation bilaterally, no apparent distress ABDOMEN: soft, nontender, no rebound or guarding GU:no  cva tenderness prostate exam is normal. There are no nodules no areas of tenderness to NEURO: Pt is awake/alert, moves all extremitiesx4 EXTREMITIES: pulses normal, full ROM SKIN: warm, color normal PSYCH: no abnormalities of mood noted  Filed Vitals:   09/14/13 0801  BP: 120/80  Pulse: 60  Temp: 98.4 F (36.9 C)  TempSrc: Oral  Resp: 16  Height: 5\' 7"  (1.702 m)  Weight: 226 lb (102.513 kg)  SpO2: 96%    Assessment & Plan:  Blood pressure meds are refilled. He is again encouraged to decrease his alcohol intake. Referrals made to cardiology and GI HE will make a followup appointment to see me in 3 months. I personally performed the services described in this documentation, which was scribed in my presence. The recorded information has been reviewed and is accurate.

## 2013-09-15 LAB — HEPATITIS C ANTIBODY: HCV Ab: NEGATIVE

## 2013-10-09 ENCOUNTER — Institutional Professional Consult (permissible substitution): Payer: BC Managed Care – PPO | Admitting: Cardiovascular Disease

## 2013-10-18 ENCOUNTER — Encounter: Payer: Self-pay | Admitting: Cardiovascular Disease

## 2013-10-18 ENCOUNTER — Telehealth: Payer: Self-pay | Admitting: *Deleted

## 2013-10-18 NOTE — Telephone Encounter (Signed)
Pt has not called to reschedule PV.  Colonoscopy scheduled for 4/29 cancelled.  No show letter mailed to pt

## 2013-10-18 NOTE — Telephone Encounter (Signed)
Pt no show for PV today 4/15.  Left message for pt to call by 5:00 pm today to reschedule PV.

## 2013-11-01 ENCOUNTER — Encounter: Payer: BC Managed Care – PPO | Admitting: Gastroenterology

## 2013-11-20 ENCOUNTER — Institutional Professional Consult (permissible substitution): Payer: BC Managed Care – PPO | Admitting: Internal Medicine

## 2013-12-13 ENCOUNTER — Encounter: Payer: Self-pay | Admitting: Internal Medicine

## 2014-07-22 ENCOUNTER — Emergency Department (HOSPITAL_COMMUNITY): Payer: Medicare Other

## 2014-07-22 ENCOUNTER — Encounter (HOSPITAL_COMMUNITY): Payer: Self-pay | Admitting: Family Medicine

## 2014-07-22 ENCOUNTER — Inpatient Hospital Stay (HOSPITAL_COMMUNITY)
Admission: EM | Admit: 2014-07-22 | Discharge: 2014-07-23 | DRG: 315 | Disposition: A | Discharge status: Home / Self Care | Payer: Medicare Other | Attending: Family Medicine | Admitting: Family Medicine

## 2014-07-22 DIAGNOSIS — E780 Pure hypercholesterolemia: Secondary | ICD-10-CM | POA: Diagnosis present

## 2014-07-22 DIAGNOSIS — I5030 Unspecified diastolic (congestive) heart failure: Secondary | ICD-10-CM | POA: Diagnosis present

## 2014-07-22 DIAGNOSIS — R0789 Other chest pain: Secondary | ICD-10-CM | POA: Diagnosis not present

## 2014-07-22 DIAGNOSIS — R001 Bradycardia, unspecified: Secondary | ICD-10-CM | POA: Diagnosis not present

## 2014-07-22 DIAGNOSIS — Z8249 Family history of ischemic heart disease and other diseases of the circulatory system: Secondary | ICD-10-CM

## 2014-07-22 DIAGNOSIS — T461X5A Adverse effect of calcium-channel blockers, initial encounter: Secondary | ICD-10-CM | POA: Diagnosis present

## 2014-07-22 DIAGNOSIS — R079 Chest pain, unspecified: Secondary | ICD-10-CM | POA: Insufficient documentation

## 2014-07-22 DIAGNOSIS — I10 Essential (primary) hypertension: Secondary | ICD-10-CM | POA: Diagnosis present

## 2014-07-22 DIAGNOSIS — Z803 Family history of malignant neoplasm of breast: Secondary | ICD-10-CM

## 2014-07-22 DIAGNOSIS — T447X5A Adverse effect of beta-adrenoreceptor antagonists, initial encounter: Secondary | ICD-10-CM | POA: Diagnosis present

## 2014-07-22 DIAGNOSIS — F1099 Alcohol use, unspecified with unspecified alcohol-induced disorder: Secondary | ICD-10-CM | POA: Diagnosis present

## 2014-07-22 DIAGNOSIS — Z87891 Personal history of nicotine dependence: Secondary | ICD-10-CM

## 2014-07-22 DIAGNOSIS — I480 Paroxysmal atrial fibrillation: Secondary | ICD-10-CM | POA: Diagnosis not present

## 2014-07-22 DIAGNOSIS — N289 Disorder of kidney and ureter, unspecified: Secondary | ICD-10-CM | POA: Insufficient documentation

## 2014-07-22 DIAGNOSIS — R072 Precordial pain: Secondary | ICD-10-CM | POA: Diagnosis not present

## 2014-07-22 DIAGNOSIS — I959 Hypotension, unspecified: Principal | ICD-10-CM | POA: Diagnosis present

## 2014-07-22 DIAGNOSIS — N179 Acute kidney failure, unspecified: Secondary | ICD-10-CM | POA: Diagnosis present

## 2014-07-22 DIAGNOSIS — Z9114 Patient's other noncompliance with medication regimen: Secondary | ICD-10-CM | POA: Diagnosis present

## 2014-07-22 DIAGNOSIS — Y92009 Unspecified place in unspecified non-institutional (private) residence as the place of occurrence of the external cause: Secondary | ICD-10-CM | POA: Diagnosis not present

## 2014-07-22 DIAGNOSIS — I9589 Other hypotension: Secondary | ICD-10-CM | POA: Diagnosis not present

## 2014-07-22 LAB — CBC WITH DIFFERENTIAL/PLATELET
Basophils Absolute: 0 10*3/uL (ref 0.0–0.1)
Basophils Relative: 0 % (ref 0–1)
Eosinophils Absolute: 0.1 10*3/uL (ref 0.0–0.7)
Eosinophils Relative: 1 % (ref 0–5)
HCT: 44.9 % (ref 39.0–52.0)
Hemoglobin: 15.3 g/dL (ref 13.0–17.0)
Lymphocytes Relative: 21 % (ref 12–46)
Lymphs Abs: 1.7 10*3/uL (ref 0.7–4.0)
MCH: 34.5 pg — ABNORMAL HIGH (ref 26.0–34.0)
MCHC: 34.1 g/dL (ref 30.0–36.0)
MCV: 101.1 fL — ABNORMAL HIGH (ref 78.0–100.0)
Monocytes Absolute: 0.3 10*3/uL (ref 0.1–1.0)
Monocytes Relative: 4 % (ref 3–12)
Neutro Abs: 6 10*3/uL (ref 1.7–7.7)
Neutrophils Relative %: 74 % (ref 43–77)
Platelets: 192 10*3/uL (ref 150–400)
RBC: 4.44 MIL/uL (ref 4.22–5.81)
RDW: 14.1 % (ref 11.5–15.5)
WBC: 8.2 10*3/uL (ref 4.0–10.5)

## 2014-07-22 LAB — I-STAT TROPONIN, ED: Troponin i, poc: 0.01 ng/mL (ref 0.00–0.08)

## 2014-07-22 LAB — COMPREHENSIVE METABOLIC PANEL
ALT: 49 U/L (ref 0–53)
AST: 49 U/L — ABNORMAL HIGH (ref 0–37)
Albumin: 3.6 g/dL (ref 3.5–5.2)
Alkaline Phosphatase: 57 U/L (ref 39–117)
Anion gap: 7 (ref 5–15)
BUN: 34 mg/dL — ABNORMAL HIGH (ref 6–23)
CO2: 23 mmol/L (ref 19–32)
Calcium: 8.9 mg/dL (ref 8.4–10.5)
Chloride: 105 mEq/L (ref 96–112)
Creatinine, Ser: 1.78 mg/dL — ABNORMAL HIGH (ref 0.50–1.35)
GFR calc Af Amer: 44 mL/min — ABNORMAL LOW (ref >90)
GFR calc non Af Amer: 38 mL/min — ABNORMAL LOW (ref >90)
Glucose, Bld: 220 mg/dL — ABNORMAL HIGH (ref 70–99)
Potassium: 4.7 mmol/L (ref 3.5–5.1)
Sodium: 135 mmol/L (ref 135–145)
Total Bilirubin: 1 mg/dL (ref 0.3–1.2)
Total Protein: 6.8 g/dL (ref 6.0–8.3)

## 2014-07-22 LAB — TROPONIN I: Troponin I: <0.03 ng/mL (ref <0.031)

## 2014-07-22 LAB — MRSA PCR SCREENING: MRSA by PCR: POSITIVE — AB

## 2014-07-22 MED ORDER — MUPIROCIN 2 % EX OINT
1.0000 "application " | TOPICAL_OINTMENT | Freq: Two times a day (BID) | CUTANEOUS | Status: DC
  Administered 2014-07-22 – 2014-07-23 (×2): 1 via NASAL
  Filled 2014-07-22: qty 22

## 2014-07-22 MED ORDER — SODIUM CHLORIDE 0.9 % IV BOLUS (SEPSIS)
1000.0000 mL | Freq: Once | INTRAVENOUS | Status: AC
  Administered 2014-07-22: 1000 mL via INTRAVENOUS

## 2014-07-22 MED ORDER — LORAZEPAM 1 MG PO TABS
1.0000 mg | ORAL_TABLET | Freq: Four times a day (QID) | ORAL | Status: DC | PRN
Start: 2014-07-22 — End: 2014-07-23

## 2014-07-22 MED ORDER — ASPIRIN EC 81 MG PO TBEC
81.0000 mg | DELAYED_RELEASE_TABLET | Freq: Every day | ORAL | Status: DC
  Administered 2014-07-22 – 2014-07-23 (×2): 81 mg via ORAL
  Filled 2014-07-22 (×2): qty 1

## 2014-07-22 MED ORDER — ONDANSETRON HCL 4 MG/2ML IJ SOLN
4.0000 mg | Freq: Once | INTRAMUSCULAR | Status: AC
  Administered 2014-07-22: 4 mg via INTRAVENOUS
  Filled 2014-07-22: qty 2

## 2014-07-22 MED ORDER — HEPARIN SODIUM (PORCINE) 5000 UNIT/ML IJ SOLN
5000.0000 [IU] | Freq: Three times a day (TID) | INTRAMUSCULAR | Status: DC
  Administered 2014-07-22 – 2014-07-23 (×2): 5000 [IU] via SUBCUTANEOUS
  Filled 2014-07-22 (×5): qty 1

## 2014-07-22 MED ORDER — CHLORHEXIDINE GLUCONATE CLOTH 2 % EX PADS
6.0000 | MEDICATED_PAD | Freq: Every day | CUTANEOUS | Status: DC
  Administered 2014-07-23: 6 via TOPICAL

## 2014-07-22 MED ORDER — SODIUM CHLORIDE 0.9 % IJ SOLN: 3.0000 mL | Freq: Two times a day (BID) | INTRAMUSCULAR | Status: DC

## 2014-07-22 MED ORDER — ACETAMINOPHEN 325 MG PO TABS: 650.0000 mg | ORAL_TABLET | Freq: Four times a day (QID) | ORAL | Status: DC | PRN

## 2014-07-22 MED ORDER — LORAZEPAM 2 MG/ML IJ SOLN: 1.0000 mg | Freq: Four times a day (QID) | INTRAMUSCULAR | Status: DC | PRN

## 2014-07-22 MED ORDER — SODIUM CHLORIDE 0.9 % IV SOLN
INTRAVENOUS | Status: DC
  Administered 2014-07-22: 23:00:00 via INTRAVENOUS

## 2014-07-22 MED ORDER — ACETAMINOPHEN 650 MG RE SUPP: 650.0000 mg | Freq: Four times a day (QID) | RECTAL | Status: DC | PRN

## 2014-07-22 NOTE — ED Provider Notes (Signed)
CSN: [id number]     Arrival date & time 07/22/14  1015 History   First MD Initiated Contact with Patient 07/22/14 1027     Chief Complaint  Patient presents with  . Chest Pain    Patient is a 64 y.o. male presenting with chest pain. The history is provided by the patient. No language interpreter was used.  Chest Pain  Pt presents for evaluation of dizziness and chest Discomfort. He has been off of his metoprolol and diltiazem for last several weeks due to insurance issues, last night he took one dose of each. He felt fine yesterday. When he woke up at 4 AM he developed dizziness, diaphoresis, mild chest discomfort and feels generally poor. He has some shortness of breath. He has no leg swelling or pain. Symptoms are moderate, constant, worsening.  Past Medical History  Diagnosis Date  . Atrial fibrillation   . Chest pain   . Hyperlipidemia   . Hypertension    Past Surgical History  Procedure Laterality Date  . Appendectomy     Family History  Problem Relation Age of Onset  . Heart disease Father   . Heart disease Sister   . Breast cancer Mother    History  Substance Use Topics  . Smoking status: Former Smoker -- 1.00 packs/day for .5 years    Types: Cigarettes    Quit date: 08/21/1972  . Smokeless tobacco: Never Used  . Alcohol Use: 1.2 oz/week    2 Cans of beer per week    Review of Systems  Cardiovascular: Positive for chest pain.  All other systems reviewed and are negative.     Allergies  Review of patient's allergies indicates no known allergies.  Home Medications   Prior to Admission medications   Medication Sig Start Date End Date Taking? Authorizing Provider  aspirin 81 MG tablet Take 81 mg by mouth daily.    Historical Provider, MD  diltiazem (CARDIZEM CD) 240 MG 24 hr capsule Take one tablet daily 09/14/13   Darlyne Russian, MD  ibuprofen (ADVIL,MOTRIN) 200 MG tablet Take 600 mg by mouth every 6 (six) hours as needed. For pain    Historical Provider, MD   metoprolol succinate (TOPROL-XL) 50 MG 24 hr tablet Take one tablet daily 09/14/13   Darlyne Russian, MD   BP 92/64 mmHg  Pulse 44  Temp(Src) 98.3 F (36.8 C)  Resp 19  SpO2 95% Physical Exam  Constitutional: He is oriented to person, place, and time. He appears well-developed and well-nourished.  HENT:  Head: Normocephalic and atraumatic.  Cardiovascular: Regular rhythm.   No murmur heard. Bradycardic  Pulmonary/Chest: Effort normal and breath sounds normal. No respiratory distress.  Abdominal: Soft. There is no tenderness. There is no rebound and no guarding.  Musculoskeletal: He exhibits no edema or tenderness.  Neurological: He is alert and oriented to person, place, and time.  Skin: Skin is warm and dry.  Psychiatric: He has a normal mood and affect. His behavior is normal.  Nursing note and vitals reviewed.   ED Course  Procedures (including critical care time) CRITICAL CARE Performed by: Quintella Reichert   Total critical care time: 30 min  Critical care time was exclusive of separately billable procedures and treating other patients.  Critical care was necessary to treat or prevent imminent or life-threatening deterioration.  Critical care was time spent personally by me on the following activities: development of treatment plan with patient and/or surrogate as well as nursing, discussions with consultants,  evaluation of patient's response to treatment, examination of patient, obtaining history from patient or surrogate, ordering and performing treatments and interventions, ordering and review of laboratory studies, ordering and review of radiographic studies, pulse oximetry and re-evaluation of patient's condition.   Labs Review Labs Reviewed  CBC WITH DIFFERENTIAL - Abnormal; Notable for the following:    MCV 101.1 (*)    MCH 34.5 (*)    All other components within normal limits  COMPREHENSIVE METABOLIC PANEL - Abnormal; Notable for the following:    Glucose, Bld  220 (*)    BUN 34 (*)    Creatinine, Ser 1.78 (*)    AST 49 (*)    GFR calc non Af Amer 38 (*)    GFR calc Af Amer 44 (*)    All other components within normal limits  I-STAT TROPOININ, ED    Imaging Review Dg Chest Portable 1 View  07/22/2014   CLINICAL DATA:  Midsternal chest pain. History of atrial fibrillation and hypertension.  EXAM: PORTABLE CHEST - 1 VIEW  COMPARISON:  09/06/2012  FINDINGS: Cardiac pads overlie the left side of the chest. Heart size is enlarged but may be accentuated by the portable technique. Lungs are clear without pulmonary edema or focal airspace disease. The trachea is midline. Negative for a pneumothorax. Mild elevation the right hemidiaphragm appears chronic.  IMPRESSION: No focal chest disease.  Question mild cardiomegaly.   Electronically Signed   By: Markus Daft M.D.   On: 07/22/2014 11:25     EKG Interpretation   Date/Time:  Sunday July 22 2014 10:16:40 EST Ventricular Rate:  47 PR Interval:    QRS Duration: 78 QT Interval:  526 QTC Calculation: 465 R Axis:   52 Text Interpretation:  atrial fibrillation with slow ventricular response  Low voltage QRS Cannot rule out Anterior infarct , age undetermined  Abnormal ECG Confirmed by Hazle Coca [id number]) on 07/22/2014 2:50:21 PM      MDM   Final diagnoses:  Chest pain  Bradycardia  Acute renal insufficiency    D/w Dr. Mare Ferrari with Cardiology - will see in consult, recommends admission to medicine  Patient here with symptomatic bradycardia, likely secondary to restarting his metoprolol and diltiazem. Patient improved after 2 L of normal saline and his chest pain resolved although patient does remain to bradycardic. Discussed with medicine regarding admission.   Quintella Reichert, MD 07/22/14 1725

## 2014-07-22 NOTE — ED Notes (Signed)
Per pt sts chest pain that started at 4 am. sts woke him up. sts mid sternal associated with dizziness.

## 2014-07-22 NOTE — ED Notes (Signed)
1L IV bolus initiated via pressure bag, per verbal MD order from MD Ralene Bathe

## 2014-07-22 NOTE — Consult Note (Addendum)
CARDIOLOGY CONSULT NOTE   Patient ID: Dillon Perkins MRN: [id number], DOB/AGE: 1950/09/15   Admit date: 07/22/2014 Date of Consult: 07/22/2014   Primary Physician: Jenny Reichmann, MD Primary Cardiologist: Formerly Dr. Lia Foyer  Pt. Profile  64 year old gentleman admitted with hypotension and bradycardia.  Problem List  Past Medical History  Diagnosis Date  . Atrial fibrillation   . Chest pain   . Hyperlipidemia   . Hypertension     Past Surgical History  Procedure Laterality Date  . Appendectomy       Allergies  No Known Allergies  HPI   This 64 year old gentleman is a former patient of Dr. Lia Foyer.  He has a past history of paroxysmal atrial fibrillation.  He has never required electrical cardioversion.  Normally he converts on his own. He does not have any history of ischemic heart disease.  He was scheduled for a plain treadmill stress test in April 2014 but was a no show. He has not been experiencing any exertional chest discomfort.  He does have a history of shortness of breath.  He has a history of high blood pressure.  He has not known himself to be diabetic but his blood sugars today are in the range of 220.  He does have a history of sleep apnea and a history of obesity. Because of lack of health insurance the patient ran out of his chronic medications for heart pressure and prevention of atrial fibrillation back in September.  He obtained no medication yesterday and took both his Cardizem CD 240 mg and his Toprol XL 50 mg tablets last evening.  Is morning he woke up at 4 AM with diaphoresis and dizziness and mild chest discomfort and weakness with shortness of breath.  He came to the emergency room where he was noted to be bradycardiac with a heart rate of 42 and hypotensive.  His blood pressure has responded to 1 L IV fluid bolus.  He is not currently having any chest discomfort.  His initial point-of-care troponin is normal at 0.01  Inpatient  Medications    Family History Family History  Problem Relation Age of Onset  . Heart disease Father   . Heart disease Sister   . Breast cancer Mother      Social History History   Social History  . Marital Status: Married    Spouse Name: N/A    Number of Children: N/A  . Years of Education: N/A   Occupational History  . Not on file.   Social History Main Topics  . Smoking status: Former Smoker -- 1.00 packs/day for .5 years    Types: Cigarettes    Quit date: 08/21/1972  . Smokeless tobacco: Never Used  . Alcohol Use: 1.2 oz/week    2 Cans of beer per week  . Drug Use: No  . Sexual Activity: Not on file   Other Topics Concern  . Not on file   Social History Narrative     Review of Systems  General:  No chills, fever, night sweats or weight changes.  Cardiovascular:  No chest pain, dyspnea on exertion, edema, orthopnea, palpitations, paroxysmal nocturnal dyspnea. Dermatological: No rash, lesions/masses Respiratory: No cough, dyspnea Urologic: No hematuria, dysuria Abdominal:   No nausea, vomiting, diarrhea, bright red blood per rectum, melena, or hematemesis Neurologic:  No visual changes, wkns, changes in mental status. All other systems reviewed and are otherwise negative except as noted above.  Physical Exam  Blood pressure 96/72, pulse 42, temperature 98.3  F (36.8 C), resp. rate 17, SpO2 87 %.  General: Pleasant, NAD Psych: Normal affect. Neuro: Alert and oriented X 3. Moves all extremities spontaneously. HEENT: Normal  Neck: Supple without bruits or JVD. Lungs:  Resp regular and unlabored, CTA. Heart: RRR no s3, s4, or murmurs. Abdomen: Soft, non-tender, non-distended, BS + x 4.  Extremities: No clubbing, cyanosis or edema. DP/PT/Radials 2+ and equal bilaterally.  Labs  No results for input(s): CKTOTAL, CKMB, TROPONINI in the last 72 hours. Lab Results  Component Value Date   WBC 8.2 07/22/2014   HGB 15.3 07/22/2014   HCT 44.9 07/22/2014    MCV 101.1* 07/22/2014   PLT 192 07/22/2014     Recent Labs Lab 07/22/14 1032  NA 135  K 4.7  CL 105  CO2 23  BUN 34*  CREATININE 1.78*  CALCIUM 8.9  PROT 6.8  BILITOT 1.0  ALKPHOS 57  ALT 49  AST 49*  GLUCOSE 220*   Lab Results  Component Value Date   CHOL 179 09/14/2013   HDL 48 09/14/2013   LDLCALC 109* 09/14/2013   TRIG 110 09/14/2013   No results found for: DDIMER  Radiology/Studies  Dg Chest Portable 1 View  07/22/2014   CLINICAL DATA:  Midsternal chest pain. History of atrial fibrillation and hypertension.  EXAM: PORTABLE CHEST - 1 VIEW  COMPARISON:  09/06/2012  FINDINGS: Cardiac pads overlie the left side of the chest. Heart size is enlarged but may be accentuated by the portable technique. Lungs are clear without pulmonary edema or focal airspace disease. The trachea is midline. Negative for a pneumothorax. Mild elevation the right hemidiaphragm appears chronic.  IMPRESSION: No focal chest disease.  Question mild cardiomegaly.   Electronically Signed   By: Markus Daft M.D.   On: 07/22/2014 11:25    ECG  EKG reviewed by me shows atrial fibrillation with slow junctional escape rhythm at 42/min. no ischemic changes  ASSESSMENT AND PLAN  1.  Recurrent atrial fibrillation 2.  Junctional bradycardia probably secondary to having taken his sustained release Cardizem and Toprol last evening. 3.  Renal insufficiency, unknown to patient 4.  Probable diabetes mellitus, new diagnosis, unknown to patient 5.  Hypotension secondary to medication, improved with fluid bolus 6.  Hypercholesterolemia  Recommendation: He is now [place] 3 for age, history of hypertension, and diabetes.  He was not aware that he was back in atrial fibrillation.  He would benefit from long-term anticoagulation, possibly a NOAC such as Apixaban. Would cycle enzymes.  Hold diltiazem and metoprolol for now.  He will need smaller doses at discharge.  Suggest update echocardiogram.  After stabilization  he may benefit from undergoing a Myoview stress test during this hospital stay to evaluate chest discomfort further.  He has risk factors for ischemic heart disease including hypercholesterolemia, hypertension, and diabetes. Will follow with you.   Signed, Darlin Coco, MD  07/22/2014, 12:48 PM

## 2014-07-22 NOTE — H&P (Signed)
[facility] Admission History and Physical Service Pager: [phone]  Patient name: Dillon Perkins Medical record number: [id number] Date of birth: 07-07-1950 Age: 64 y.o. Gender: male  Primary Care Provider: Jenny Reichmann, MD Consultants: cardiology Code Status: presume full code  Chief Complaint: bradycardia & hypotension  Assessment and Plan: Dillon Perkins is a 64 y.o. male presenting with hypotension and bradycardia in setting of abruptly restarting home diltiazem and metoprolol. PMH is significant for paroxysmal afib, HTN, HLD.  # Bradycardia & hypotension: likely related to abruptly restarting relatively high doses of diltiazem and metoprolol after a four month period of noncompliance. Symptomatically improved in the ER with fluid resuscitation but remains bradycardic with ventricular rates in the 40's. EKG suggestive of possible junctional rhythm. Initial troponin negative, and CXR clear. - admit to stepdown unit - monitor closely on tele - hold all rate lowering and antihypertensive agents - cardiology has already seen patient and will follow, greatly appreciate cardiology recs - cycle troponin x 3 - repeat EKG in AM - risk stratification: update echo, check lipids, TSH, A1c, possible myoview once more stabilized - up with assistance, will likely benefit from PT eval prior to discharge to ensure safe ambulation  # Paroxysmal afib: CHADS2VASC score is 3 (age, HTN, hx grade 2 diastolic CHF on last echo). Should likely be anticoagulated. Denies hx of bleeding recently. - aspirin 81mg  daily for now - likely NOAC best option - case management consult to discuss with pt's insurance which NOAC they will cover - holding metoprolol & diltiazem at this time  # Mild AKI: baseline Cr around 1, with cr 1.78 on admission. Likely due to hypoperfusion of kidneys during bradycardic & hypotensive period. - check UA (not done in ER) - hydrate with IVF NS @ 75  cc/hr - repeat BMET tomorrow morning  # ETOH use: endorses drinking several glasses of scotch per night. Has never had withdrawal sx's before per pt and wife. AST mildly elevated at 49, ALT normal. - CIWA protocol out of an abundance of caution  FEN/GI: heart healthy diet, NS @ 75 cc/hr given relative hypotension Prophylaxis: SQ heparin  Disposition: admit to stepdown for close monitoring, d/c pending stabilization of BP & pulse. Likely should have PT consult and myoview prior to discharge.  History of Present Illness: Dillon Perkins is a 64 y.o. male presenting with symptomatic bradycardia and hypotension.  Pt has hx of paroxysmal afib and has not taken his home diltiazem or metoprolol since September due to insurance issues. 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 He got his rx filled for diltiazem and metoprolol yesterday, and took both of these medications last night around 8pm. He woke up around 4am to urinate, and noted that he was feeling, dizzy, nauseated, cold sweats, with mild chest discomfort and shortness of breath. Came to the ER around 10am since this was not getting better.   Family hx is significant for father and sister who both died of MI at age 45. He was previously followed by Dr. Lia Foyer, who recently retired. PCP is Dr[facility] Everlene Farrier at [facility]. Prior to yesterday has not taken any medications regularly. Was recommended to take low dose aspirin in the past for his atrial fibrillation, but has not done that. He is now amenable to anticoagulation.  Cardiology consulted in the ER, recommended medical admission.  Review Of Systems: Per HPI, otherwise 12 point review of systems was performed  and was unremarkable.  Patient Active Problem List   Diagnosis Date Noted  . Bradycardia 07/22/2014  . Exercise intolerance 09/24/2012  . Abnormal CXR 09/06/2012  . Abnormal LFTs (liver function tests) 02/07/2012  . Hyperlipidemia  02/07/2012  . Atrial fibrillation 01/31/2012  . Snoring 01/31/2012  . HYPERTENSION 08/29/2010   Past Medical History: Past Medical History  Diagnosis Date  . Atrial fibrillation   . Chest pain   . Hyperlipidemia   . Hypertension    Past Surgical History: Past Surgical History  Procedure Laterality Date  . Appendectomy     Social History: History  Substance Use Topics  . Smoking status: Former Smoker -- 1.00 packs/day for .5 years    Types: Cigarettes    Quit date: 08/21/1972  . Smokeless tobacco: Never Used  . Alcohol Use: 1.2 oz/week    2 Cans of beer per week   Additional social history: drinks several scotches per night, no smoking, no drugs  Please also refer to relevant sections of EMR.  Family History: Family History  Problem Relation Age of Onset  . Heart disease Father   . Heart disease Sister   . Breast cancer Mother    Allergies and Medications: No Known Allergies No current facility-administered medications on file prior to encounter.   Current Outpatient Prescriptions on File Prior to Encounter  Medication Sig Dispense Refill  . aspirin 81 MG tablet Take 81 mg by mouth daily.    Marland Kitchen diltiazem (CARDIZEM CD) 240 MG 24 hr capsule Take one tablet daily 30 capsule 11  . ibuprofen (ADVIL,MOTRIN) 200 MG tablet Take 600 mg by mouth every 6 (six) hours as needed. For pain    . metoprolol succinate (TOPROL-XL) 50 MG 24 hr tablet Take one tablet daily 30 tablet 11    Objective: BP 98/71 mmHg  Pulse 42  Temp(Src) 98.3 F (36.8 C)  Resp 15  SpO2 92% Exam: General: NAD, pleasant and cooperative, lying in bed HEENT: NCAT, MMM, oropharynx clear and moist, PERRL Cardiovascular: heart sounds distant, bradycardic, no murmurs, 2+ radial pulses bilat Respiratory: CTAB, NWOB Abdomen: soft, NTTP Extremities: No appreciable lower extremity edema bilaterally Skin: no rashes noted, no diaphoresis Neuro: speech normal, grossly alert and oriented, full strength all  extremities  Labs and Imaging: CBC BMET   Recent Labs Lab 07/22/14 1032  WBC 8.2  HGB 15.3  HCT 44.9  PLT 192    Recent Labs Lab 07/22/14 1032  NA 135  K 4.7  CL 105  CO2 23  BUN 34*  CREATININE 1.78*  GLUCOSE 220*  CALCIUM 8.9     EKG ?junctional rhythm (narrow complex, regular, vent rate 47), no easily discernable p-waves but some baseline variation, no obvious ST changes  Leeanne Rio, MD 07/22/2014, 1:54 PM PGY-3, [place] Intern pager: [phone], text pages welcome

## 2014-07-23 DIAGNOSIS — N289 Disorder of kidney and ureter, unspecified: Secondary | ICD-10-CM

## 2014-07-23 DIAGNOSIS — I9589 Other hypotension: Secondary | ICD-10-CM

## 2014-07-23 DIAGNOSIS — I480 Paroxysmal atrial fibrillation: Secondary | ICD-10-CM

## 2014-07-23 DIAGNOSIS — R0789 Other chest pain: Secondary | ICD-10-CM

## 2014-07-23 DIAGNOSIS — I059 Rheumatic mitral valve disease, unspecified: Secondary | ICD-10-CM

## 2014-07-23 LAB — BASIC METABOLIC PANEL
Anion gap: 8 (ref 5–15)
BUN: 39 mg/dL — ABNORMAL HIGH (ref 6–23)
CO2: 20 mmol/L (ref 19–32)
Calcium: 8.3 mg/dL — ABNORMAL LOW (ref 8.4–10.5)
Chloride: 109 mEq/L (ref 96–112)
Creatinine, Ser: 1.78 mg/dL — ABNORMAL HIGH (ref 0.50–1.35)
GFR calc Af Amer: 44 mL/min — ABNORMAL LOW (ref >90)
GFR calc non Af Amer: 38 mL/min — ABNORMAL LOW (ref >90)
Glucose, Bld: 150 mg/dL — ABNORMAL HIGH (ref 70–99)
Potassium: 4.4 mmol/L (ref 3.5–5.1)
Sodium: 137 mmol/L (ref 135–145)

## 2014-07-23 LAB — CBC
HCT: 41.2 % (ref 39.0–52.0)
Hemoglobin: 13.8 g/dL (ref 13.0–17.0)
MCH: 34.4 pg — ABNORMAL HIGH (ref 26.0–34.0)
MCHC: 33.5 g/dL (ref 30.0–36.0)
MCV: 102.7 fL — ABNORMAL HIGH (ref 78.0–100.0)
Platelets: 150 10*3/uL (ref 150–400)
RBC: 4.01 MIL/uL — ABNORMAL LOW (ref 4.22–5.81)
RDW: 14.4 % (ref 11.5–15.5)
WBC: 8.8 10*3/uL (ref 4.0–10.5)

## 2014-07-23 LAB — URINALYSIS, ROUTINE W REFLEX MICROSCOPIC
Glucose, UA: NEGATIVE mg/dL
Hgb urine dipstick: NEGATIVE
Ketones, ur: NEGATIVE mg/dL
Leukocytes, UA: NEGATIVE
Nitrite: NEGATIVE
Protein, ur: NEGATIVE mg/dL
Specific Gravity, Urine: 1.024 (ref 1.005–1.030)
Urobilinogen, UA: 1 mg/dL (ref 0.0–1.0)
pH: 6 (ref 5.0–8.0)

## 2014-07-23 LAB — LIPID PANEL
Cholesterol: 133 mg/dL (ref 0–200)
HDL: 35 mg/dL — ABNORMAL LOW (ref >39)
LDL Cholesterol: 74 mg/dL (ref 0–99)
Total CHOL/HDL Ratio: 3.8 RATIO
Triglycerides: 122 mg/dL (ref <150)
VLDL: 24 mg/dL (ref 0–40)

## 2014-07-23 LAB — TSH: TSH: 2.065 u[IU]/mL (ref 0.350–4.500)

## 2014-07-23 LAB — TROPONIN I
Troponin I: <0.03 ng/mL (ref <0.031)
Troponin I: <0.03 ng/mL (ref <0.031)

## 2014-07-23 LAB — HEMOGLOBIN A1C
Hgb A1c MFr Bld: 5.7 % — ABNORMAL HIGH (ref <5.7)
Mean Plasma Glucose: 117 mg/dL — ABNORMAL HIGH (ref <117)

## 2014-07-23 MED ORDER — APIXABAN 5 MG PO TABS: 5.0000 mg | ORAL_TABLET | Freq: Two times a day (BID) | ORAL | Status: DC

## 2014-07-23 MED ORDER — APIXABAN 5 MG PO TABS
5.0000 mg | ORAL_TABLET | Freq: Two times a day (BID) | ORAL | Status: DC
  Administered 2014-07-23: 5 mg via ORAL
  Filled 2014-07-23 (×2): qty 1

## 2014-07-23 MED ORDER — METOPROLOL SUCCINATE ER 25 MG PO TB24
25.0000 mg | ORAL_TABLET | Freq: Every day | ORAL | Status: DC
  Administered 2014-07-23: 25 mg via ORAL
  Filled 2014-07-23: qty 1

## 2014-07-23 MED ORDER — METOPROLOL SUCCINATE ER 25 MG PO TB24
25.0000 mg | ORAL_TABLET | Freq: Every day | ORAL | Status: DC
Start: 2014-07-23 — End: 2014-08-27

## 2014-07-23 NOTE — Progress Notes (Signed)
Pt discharged home, DC instructions reviewed with pt and spouse. All questions answered.

## 2014-07-23 NOTE — Progress Notes (Signed)
Patient ID: Dillon Perkins, male   DOB: September 23, 1950, 64 y.o.   MRN: [id number]     SUBJECTIVE: Doing well, BP stable and HR in the 70s-90s, atrial fibrillation.    Scheduled Meds: . apixaban  5 mg Oral BID  . aspirin EC  81 mg Oral Daily  . Chlorhexidine Gluconate Cloth  6 each Topical Q0600  . metoprolol succinate  25 mg Oral Daily  . mupirocin ointment  1 application Nasal BID  . sodium chloride  3 mL Intravenous Q12H   Continuous Infusions: . sodium chloride 75 mL/hr at 07/23/14 0800   PRN Meds:.acetaminophen **OR** acetaminophen, LORazepam **OR** LORazepam    Filed Vitals:   07/23/14 0400 07/23/14 0500 07/23/14 0800 07/23/14 0900  BP: 105/71 108/74  126/74  Pulse: 71 56    Temp:   98.6 F (37 C) 97.4 F (36.3 C)  TempSrc:   Oral Oral  Resp: 17 14 15 15   Height:      Weight:      SpO2: 95% 96%  97%    Intake/Output Summary (Last 24 hours) at 07/23/14 1147 Last data filed at 07/23/14 0900  Gross per 24 hour  Intake 1001.25 ml  Output    475 ml  Net 526.25 ml    LABS: Basic Metabolic Panel:  Recent Labs  07/22/14 1032 07/23/14 0505  NA 135 137  K 4.7 4.4  CL 105 109  CO2 23 20  GLUCOSE 220* 150*  BUN 34* 39*  CREATININE 1.78* 1.78*  CALCIUM 8.9 8.3*   Liver Function Tests:  Recent Labs  07/22/14 1032  AST 49*  ALT 49  ALKPHOS 57  BILITOT 1.0  PROT 6.8  ALBUMIN 3.6   No results for input(s): LIPASE, AMYLASE in the last 72 hours. CBC:  Recent Labs  07/22/14 1032 07/23/14 0505  WBC 8.2 8.8  NEUTROABS 6.0  --   HGB 15.3 13.8  HCT 44.9 41.2  MCV 101.1* 102.7*  PLT 192 150   Cardiac Enzymes:  Recent Labs  07/22/14 1750 07/22/14 2325 07/23/14 0505  TROPONINI <0.03 <0.03 <0.03   BNP: Invalid input(s): POCBNP D-Dimer: No results for input(s): DDIMER in the last 72 hours. Hemoglobin A1C:  Recent Labs  07/22/14 1750  HGBA1C 5.7*   Fasting Lipid Panel:  Recent Labs  07/23/14 0505  CHOL 133  HDL 35*  LDLCALC 74    TRIG 122  CHOLHDL 3.8   Thyroid Function Tests:  Recent Labs  07/22/14 2216  TSH 2.065   Anemia Panel: No results for input(s): VITAMINB12, FOLATE, FERRITIN, TIBC, IRON, RETICCTPCT in the last 72 hours.  RADIOLOGY: Dg Chest Portable 1 View  07/22/2014   CLINICAL DATA:  Midsternal chest pain. History of atrial fibrillation and hypertension.  EXAM: PORTABLE CHEST - 1 VIEW  COMPARISON:  09/06/2012  FINDINGS: Cardiac pads overlie the left side of the chest. Heart size is enlarged but may be accentuated by the portable technique. Lungs are clear without pulmonary edema or focal airspace disease. The trachea is midline. Negative for a pneumothorax. Mild elevation the right hemidiaphragm appears chronic.  IMPRESSION: No focal chest disease.  Question mild cardiomegaly.   Electronically Signed   By: Markus Daft M.D.   On: 07/22/2014 11:25    PHYSICAL EXAM General: NAD Neck: Thick, no JVD, no thyromegaly or thyroid nodule.  Lungs: Clear to auscultation bilaterally with normal respiratory effort. CV: Nondisplaced PMI.  Heart regular S1/S2, no S3/S4, no murmur.  No peripheral edema.  No  carotid bruit.  Normal pedal pulses.  Abdomen: Soft, nontender, no hepatosplenomegaly, no distention.  Neurologic: Alert and oriented x 3.  Psych: Normal affect. Extremities: No clubbing or cyanosis.   TELEMETRY: Reviewed telemetry pt in atrial fibrillation, 70s-90s  ASSESSMENT AND PLAN: 64 yo with history of paroxysmal atrial fibrillation had run out of his meds since 9/15.  He got Toprol XL and diltiazem CD recently and restarted them evening 1/16.  He woke up 1/17 feeling weak, dizzy, and short of breath.  In the ER, he was hypotensive with HR in 40s.  Since stopping Toprol XL and diltiazem CD, HR and BP are both back up again.  Of note, he is back in atrial fibrillation.   CHADSVASC = 2-3 (probably 3 with diabetes).  - Would start apixaban 5 mg bid.  - Stop ASA - Can start Toprol XL 25 mg daily for rate  control, HR now up to 90s. This will be his only rate control med.  - Needs echo. - Can get Cardiolite as outpatient for atypical chest pain.  - I think he can go home today after echo, will arrange followup in cardiology office.  If he remains in atrial fibrillation, he can have DCCV after 1 month anticoagulation with Eliquis.   Loralie Champagne 07/23/2014 11:52 AM

## 2014-07-23 NOTE — Discharge Instructions (Signed)
Bradycardia °Bradycardia is a term for a heart rate (pulse) that, in adults, is slower than 60 beats per minute. A normal rate is 60 to 100 beats per minute. A heart rate below 60 beats per minute may be normal for some adults with healthy hearts. If the rate is too slow, the heart may have trouble pumping the volume of blood the body needs. If the heart rate gets too low, blood flow to the brain may be decreased and may make you feel lightheaded, dizzy, or faint. °The heart has a natural pacemaker in the top of the heart called the SA node (sinoatrial or sinus node). This pacemaker sends out regular electrical signals to the muscle of the heart, telling the heart muscle when to beat (contract). The electrical signal travels from the upper parts of the heart (atria) through the AV node (atrioventricular node), to the lower chambers of the heart (ventricles). The ventricles squeeze, pumping the blood from your heart to your lungs and to the rest of your body. °CAUSES  °· Problem with the heart's electrical system. °· Problem with the heart's natural pacemaker. °· Heart disease, damage, or infection. °· Medications. °· Problems with minerals and salts (electrolytes). °SYMPTOMS  °· Fainting (syncope). °· Fatigue and weakness. °· Shortness of breath (dyspnea). °· Chest pain (angina). °· Drowsiness. °· Confusion. °DIAGNOSIS  °· An electrocardiogram (ECG) can help your caregiver determine the type of slow heart rate you have. °· If the cause is not seen on an ECG, you may need to wear a heart monitor that records your heart rhythm for several hours or days. °· Blood tests. °TREATMENT  °· Electrolyte supplements. °· Medications. °· Withholding medication which is causing a slow heart rate. °· Pacemaker placement. °SEEK IMMEDIATE MEDICAL CARE IF:  °· You feel lightheaded or faint. °· You develop an irregular heart rate. °· You feel chest pain or have trouble breathing. °MAKE SURE YOU:  °· Understand these  instructions. °· Will watch your condition. °· Will get help right away if you are not doing well or get worse. °Document Released: 03/14/2002 Document Revised: 09/14/2011 Document Reviewed: 09/27/2013 °ExitCare® Patient Information ©2015 [employer]. This information is not intended to replace advice given to you by your health care provider. Make sure you discuss any questions you have with your health care provider. ° °

## 2014-07-23 NOTE — Progress Notes (Signed)
Family Medicine Teaching Service Daily Progress Note Intern Pager: [phone]  Patient name: Dillon Perkins Medical record number: [id number] Date of birth: 08/06/1950 Age: 64 y.o. Gender: male  Primary Care Provider: Jenny Reichmann, MD Consultants: Cardiology Code Status: Full  Assessment and Plan: 64 y.o. male presenting with hypotension and bradycardia in setting of abruptly restarting home diltiazem and metoprolol. PMH is significant for paroxysmal afib, HTN, HLD.  # Bradycardia & Hypotension: likely related to abruptly restarting relatively high doses of diltiazem and metoprolol after a four month period of noncompliance. Symptomatically improved in the ER with fluid resuscitation - HR 80, BP 134/81 - Troponins negative - Lipids: cholesterol 133, TG 122, HDL 35, LDL 74 - A1C 5.7 - TSH 2.065 - CXR- no acute cardiopulmonary disease - Hold all rate lowering and antihypertensive agents - Cardiology consulted. Appreciate recommendations.  - Discharge after Echo  - Apixaban 5mg  BID, stop aspirin  - Restart Tobrol XL 25mg   # Paroxysmal afib: CHADS2VASC score is 3 (age, HTN, hx grade 2 diastolic CHF on last echo) - Apixaban 5mg  BID - Metoprolol 25mg   # Mild AKI: baseline Cr around 1, with cr 1.78 on admission. Likely due to hypoperfusion of kidneys during bradycardic & hypotensive period. - UA- small bilirubin - Hydrate with IVF NS @ 75 cc/hr - Follow up as outpatient  # ETOH use:  - AST mildly elevated at 49, ALT normal. - CIWA protocol out of an abundance of caution  FEN/GI: heart healthy diet, NS @ 75 cc/hr given relative hypotension Prophylaxis: SQ heparin  Disposition: Admitted to [facility] Medicine Teaching Service. Discharge after Echo  Subjective:  No acute complaints overnight. Feeling much better this morning. Would like to go home and agrees to close follow up and specialists. Needs note for work. No further complaints.  Initially presented with symptomatic  bradycardia (dizziness, nausea, cold sweats, mild chest discomfort, dyspnea) and hypotension. Had not taken his Diltiazem or Metoprolol since September due to insurance problems, but restarted both medications on day prior to presentation to hospital.  Objective: Temp:  [97.3 F (36.3 C)-98.3 F (36.8 C)] 98.1 F (36.7 C) (01/18 0359) Pulse Rate:  [39-100] 56 (01/18 0500) Resp:  [11-23] 14 (01/18 0500) BP: (86-128)/(48-85) 108/74 mmHg (01/18 0500) SpO2:  [87 %-99 %] 96 % (01/18 0500) Weight:  [230 lb 6.1 oz (104.5 kg)] 230 lb 6.1 oz (104.5 kg) (01/17 1900) Physical Exam: General: 64yo male resting comfortably in no apparent distress Cardiovascular: S1 and S2 noted. No murmurs/rubs/gallops. Respiratory: Clear to auscultation bilaterally. No wheezes/rales/rhonchi. Abdomen: Soft and nondistended. No tenderness to palpation. Extremities: No edema noted  Laboratory:  Recent Labs Lab 07/22/14 1032 07/23/14 0505  WBC 8.2 8.8  HGB 15.3 13.8  HCT 44.9 41.2  PLT 192 150    Recent Labs Lab 07/22/14 1032 07/23/14 0505  NA 135 137  K 4.7 4.4  CL 105 109  CO2 23 20  BUN 34* 39*  CREATININE 1.78* 1.78*  CALCIUM 8.9 8.3*  PROT 6.8  --   BILITOT 1.0  --   ALKPHOS 57  --   ALT 49  --   AST 49*  --   GLUCOSE 220* 150*   Lipid Panel     Component Value Date/Time   CHOL 133 07/23/2014 0505   TRIG 122 07/23/2014 0505   HDL 35* 07/23/2014 0505   CHOLHDL 3.8 07/23/2014 0505   VLDL 24 07/23/2014 0505   LDLCALC 74 07/23/2014 0505   Urinalysis  Component Value Date/Time   COLORURINE AMBER* 07/23/2014 0402   APPEARANCEUR CLOUDY* 07/23/2014 0402   LABSPEC 1.024 07/23/2014 0402   PHURINE 6.0 07/23/2014 0402   GLUCOSEU NEGATIVE 07/23/2014 0402   HGBUR NEGATIVE 07/23/2014 0402   BILIRUBINUR SMALL* 07/23/2014 0402   KETONESUR NEGATIVE 07/23/2014 0402   PROTEINUR NEGATIVE 07/23/2014 0402   UROBILINOGEN 1.0 07/23/2014 0402   NITRITE NEGATIVE 07/23/2014 0402   LEUKOCYTESUR  NEGATIVE 07/23/2014 0402  - Troponins negative - A1C 5.7 - TSH 2.065  Imaging/Diagnostic Tests: Dg Chest Portable 1 View  07/22/2014   CLINICAL DATA:  Midsternal chest pain. History of atrial fibrillation and hypertension.  EXAM: PORTABLE CHEST - 1 VIEW  COMPARISON:  09/06/2012  FINDINGS: Cardiac pads overlie the left side of the chest. Heart size is enlarged but may be accentuated by the portable technique. Lungs are clear without pulmonary edema or focal airspace disease. The trachea is midline. Negative for a pneumothorax. Mild elevation the right hemidiaphragm appears chronic.  IMPRESSION: No focal chest disease.  Question mild cardiomegaly.   Electronically Signed   By: Markus Daft M.D.   On: 07/22/2014 11:25   Lorna Few, DO 07/23/2014, 6:50 AM PGY-1, [place] Intern pager: [phone], text pages welcome

## 2014-07-23 NOTE — Discharge Summary (Signed)
[facility] Discharge Summary  Patient name: Dillon Perkins Medical record number: [id number] Date of birth: 1950/11/26 Age: 64 y.o. Gender: male Date of Admission: 07/22/2014  Date of Discharge: 07/23/14 Admitting Physician: Lupita Dawn, MD  Primary Care Provider: Jenny Reichmann, MD Consultants: Cardiology  Indication for Hospitalization: Bradycardia, Hypotension  Discharge Diagnoses/Problem List:  Bradycardia Hypotension Paroxysmal Atrial Fibrillation AKI  Disposition: Discharge Home  Discharge Condition: Stable  Brief Hospital Course:  Dillon Perkins is a 64yo male who presented to the ED on 07/22/14 with hypotension and bradycardia after restarting his diltiazem and metoprolol after period of noncompliance since 03/2014 secondary to problems with insurance. Metoprolol and diltiazem were held throughout hospitalization. Troponins negative. Lipid panel showed cholesterol 133, TG 122, HDL 35, and LDL 74. A1C and TSH within normal limits. CXR showed no acute cardiopulmonary disease.  Cardiology consulted. Recommended echocardiogram. Echo showed EF 45-50% with the left atrium being severely dilated and the right atrium being moderately dilated.  Heart rate and blood pressure improved throughout hospitalization.  Dillon Perkins was noted to be in atrial fibrillation prior to discharge. Cardiology recommended initiation of Apixaban 5mg  BID. Metoprolol was re-initiated at 25mg  prior to discharge.   Dillon Perkins was noted to have an elevated creatinine of 1.78 at admission, which was suspected to be due to hypoperfusion. Creatinine stable during hospitalization.  Issues for Follow Up:  - Follow up heart rate and bradycardia. Diltiazem discontinued. Metoprolol restarted at decreased dose of 25mg . Consider titrating up as tolerated. - Follow up BMP to recheck creatinine - Follow up with Cardiology.  - Recheck rhythm. If continues in atrial fibrillation, can have  DCCV after one month of anticoagulation with Eliquis, which was initiated on day of discharge.  Significant Procedures: None  Significant Labs and Imaging:   Recent Labs Lab 07/22/14 1032 07/23/14 0505  WBC 8.2 8.8  HGB 15.3 13.8  HCT 44.9 41.2  PLT 192 150    Recent Labs Lab 07/22/14 1032 07/23/14 0505  NA 135 137  K 4.7 4.4  CL 105 109  CO2 23 20  GLUCOSE 220* 150*  BUN 34* 39*  CREATININE 1.78* 1.78*  CALCIUM 8.9 8.3*  ALKPHOS 57  --   AST 49*  --   ALT 49  --   ALBUMIN 3.6  --   - Troponins negative - Lipids: cholesterol 133, TG 122, HDL 35, LDL 74 - A1C 5.7 - TSH 2.065 - CXR- no acute cardiopulmonary disease  Results/Tests Pending at Time of Discharge: None  Discharge Medications:    Medication List    STOP taking these medications        diltiazem 240 MG 24 hr capsule  Commonly known as:  CARDIZEM CD      TAKE these medications        apixaban 5 MG Tabs tablet  Commonly known as:  ELIQUIS  Take 1 tablet (5 mg total) by mouth 2 (two) times daily.     ibuprofen 200 MG tablet  Commonly known as:  ADVIL,MOTRIN  Take 400 mg by mouth every 6 (six) hours as needed for mild pain. For pain     metoprolol succinate 25 MG 24 hr tablet  Commonly known as:  TOPROL-XL  Take 1 tablet (25 mg total) by mouth daily.        Discharge Instructions: Please refer to Patient Instructions section of EMR for full details.  Patient was counseled important signs and symptoms that should prompt return to  medical care, changes in medications, dietary instructions, activity restrictions, and follow up appointments.   Follow-Up Appointments:     Follow-up Information    Follow up with Ermalinda Barrios, PA-C On 08/13/2014.   Specialty:  Cardiology   Why:  9:45 am cardiology f/u   Contact information:   [address] [place] [address] [phone]       Follow up with DAUB, STEVE A, MD In 1 week.   Specialty:  Family Medicine   Contact  information:   [place] [id number] [phone]      Lorna Few, [place] 07/23/2014, 10:54 PM PGY-1, [place]

## 2014-07-23 NOTE — Care Management Note (Signed)
    Page 1 of 1   07/23/2014     1:36:32 PM CARE MANAGEMENT NOTE 07/23/2014  Patient:  Dillon Perkins, Dillon Perkins   Account Number:  [id number]  Date Initiated:  07/23/2014  Documentation initiated by:  Elissa Hefty  Subjective/Objective Assessment:   adm w brady     Action/Plan:   lives w wife, pcp dr Remo Lipps daub   Anticipated DC Date:     Anticipated DC Plan:  HOME/SELF CARE      DC Planning Services  CM consult  Medication Assistance      Choice offered to / List presented to:             Status of service:   Medicare Important Message given?   (If response is "NO", the following Medicare IM given date fields will be blank) Date Medicare IM given:   Medicare IM given by:   Date Additional Medicare IM given:   Additional Medicare IM given by:    Discharge Disposition:    Per UR Regulation:  Reviewed for med. necessity/level of care/duration of stay  If discussed at [place] of Stay Meetings, dates discussed:    Comments:  1/18 1335 debbie Lashelle Koy rn,bsn spoke w pt. gave him 30day free eliquis card. pt has medicare d w [employer] for meds.

## 2014-07-23 NOTE — Progress Notes (Signed)
  Echocardiogram 2D Echocardiogram has been performed.  Dillon Perkins 07/23/2014, 3:21 PM

## 2014-07-27 DIAGNOSIS — R079 Chest pain, unspecified: Secondary | ICD-10-CM | POA: Insufficient documentation

## 2014-08-13 ENCOUNTER — Encounter: Payer: Medicare Other | Admitting: Physician Assistant

## 2014-08-27 ENCOUNTER — Ambulatory Visit (INDEPENDENT_AMBULATORY_CARE_PROVIDER_SITE_OTHER): Payer: Medicare Other | Admitting: Physician Assistant

## 2014-08-27 ENCOUNTER — Encounter: Payer: Self-pay | Admitting: Physician Assistant

## 2014-08-27 VITALS — BP 145/99 | HR 87 | Ht 68.0 in | Wt 231.0 lb

## 2014-08-27 DIAGNOSIS — I10 Essential (primary) hypertension: Secondary | ICD-10-CM | POA: Diagnosis not present

## 2014-08-27 DIAGNOSIS — I4891 Unspecified atrial fibrillation: Secondary | ICD-10-CM

## 2014-08-27 DIAGNOSIS — R079 Chest pain, unspecified: Secondary | ICD-10-CM | POA: Diagnosis not present

## 2014-08-27 MED ORDER — METOPROLOL SUCCINATE ER 50 MG PO TB24: 50.0000 mg | ORAL_TABLET | Freq: Every day | ORAL | Status: DC

## 2014-08-27 NOTE — Progress Notes (Signed)
Cardiology Office Note   Date:  08/27/2014   ID:  Dillon Perkins, DOB September 09, 1950, MRN [id number]  PCP:  Jenny Reichmann, MD  Cardiologist:  Mare Ferrari, M.D.  Chief Complaint: Dizziness, PAF    History of Present Illness: Dillon Perkins is a 64 y.o. male who presents for follow-up after recent hospitalization. He was a prior patient of Dr. Addison Lank and hasn't been seen in our office since 2014. He has a history of paroxysmal atrial fibrillation and ran out of his medications since 03/2014. He recently got his Toprol-XL and Cardizem CD filled restarted them on the evening on 07/21/14. The following day he woke up feeling dizzy weak and short of breath. In the emergency room he was hypotensive with a heart rate in the 40s. He was back in atrial fibrillation. Chadsvasc was 3. Apixaban 5 mg twice a day was started aspirin was stopped. Toprol-XL 25 mg once daily was started for rate control because his heart rate was back in the 90s. Plan was to do it DC CV after one month anticoagulation of Eliquis if he remained in atrial fibrillation. Also to get an outpatient Cardiolite for atypical chest pain. 2-D echo showed mildly reduced LV function EF 45-50%. Wall motion was normal mild MR severely dilated LA and moderately dilated RA. EF was reduced from last echo in 2013 at which time was 60-65%.  Patient comes in today feeling quite well. He denies any palpitations or chest pain. His heart rate is in the 90s. He is only taking Eliquis once daily because he was told that the pharmacy it was going to cost him $300 per month so he is trying to stretch it out.  Past Medical History  Diagnosis Date  . Atrial fibrillation   . Chest pain   . Hyperlipidemia   . Hypertension     Past Surgical History  Procedure Laterality Date  . Appendectomy       Current Outpatient Prescriptions  Medication Sig Dispense Refill  . apixaban (ELIQUIS) 5 MG TABS tablet Take 1 tablet (5 mg total) by mouth 2 (two)  times daily. 60 tablet 0  . ibuprofen (ADVIL,MOTRIN) 200 MG tablet Take 400 mg by mouth every 6 (six) hours as needed for mild pain. For pain    . metoprolol succinate (TOPROL-XL) 25 MG 24 hr tablet Take 1 tablet (25 mg total) by mouth daily. 90 tablet 0   No current facility-administered medications for this visit.    Allergies:   Review of patient's allergies indicates no known allergies.    Social History:  The patient  reports that he quit smoking about 42 years ago. His smoking use included Cigarettes. He has a .5 pack-year smoking history. He has never used smokeless tobacco. He reports that he drinks about 1.2 oz of alcohol per week. He reports that he does not use illicit drugs.   Family History:  The patient'sfamily history includes Breast cancer in his mother; Heart disease in his father and sister.    ROS:  Please see the history of present illness.   Otherwise, review of systems are positive for none.   All other systems are reviewed and negative.    PHYSICAL EXAM: BP 145/99 mmHg  Pulse 87  Ht 5\' 8"  (1.727 m)  Wt 231 lb (104.781 kg)  BMI 35.13 kg/m2 GEN: Obese, well developed, in no acute distress HEENT: normal Neck: no JVD, HJR, carotid bruits, or masses Cardiac: Irregular irregular; no gallop ,murmurs, rubs, thrill or  heave,no edema,   Respiratory:  clear to auscultation bilaterally, normal work of breathing GI: soft, nontender, nondistended, + BS MS: no deformity or atrophy Extremities: without cyanosis, clubbing, edema, good distal pulses bilaterally.  Skin: warm and dry, no rash Neuro:  Strength and sensation are intact Psych: euthymic mood, full affect   EKG:  EKG is ordered today. The ekg ordered today demonstrates atrial fibrillation at 90 bpm nonspecific ST-T wave changes, no acute change   Recent Labs: 07/22/2014: ALT 49; TSH 2.065 07/23/2014: BUN 39*; Creatinine 1.78*; Hemoglobin 13.8; Platelets 150; Potassium 4.4; Sodium 137    Lipid Panel     Component Value Date/Time   CHOL 133 07/23/2014 0505   TRIG 122 07/23/2014 0505   HDL 35* 07/23/2014 0505   CHOLHDL 3.8 07/23/2014 0505   VLDL 24 07/23/2014 0505   LDLCALC 74 07/23/2014 0505      Wt Readings from Last 3 Encounters:  07/22/14 230 lb 6.1 oz (104.5 kg)  09/14/13 226 lb (102.513 kg)  09/23/12 217 lb (98.431 kg)      Other studies Reviewed: Additional studies/ records that were reviewed today include:  2-D echo 07/23/14: Study Conclusions  - Left ventricle: The cavity size was normal. Wall thickness was   normal. Systolic function was mildly reduced. The estimated   ejection fraction was in the range of 45% to 50%. Wall motion was   normal; there were no regional wall motion abnormalities. - Mitral valve: Calcified annulus. There was mild regurgitation. - Left atrium: The atrium was severely dilated. - Right atrium: The atrium was moderately dilated   ASSESSMENT AND PLAN: Atrial fibrillation Patient remains in atrial fibrillation at 90 bpm. I will increase his Toprol to 50 mg once daily for better rate control. He is only taken Eliquis once daily because of cost. I asked him to call his insurance company to see which would be the most reasonable cost between Eliquis, Pradaxa, and Xarelto. He is to contact us and let us know. If he can afford these Coumadin will have to be a consideration. In the meantime he is advised to take Eliquis twice daily. Follow-up with Dr. Mare Ferrari one month for possible cardioversion.   Essential hypertension Blood pressure is up today. Increase metoprolol to 50 mg once daily. Low-sodium diet.   Chest pain Patient has had no further chest pain. He will need a stress Myoview eventually but will hold off until his atrial fibrillation is better controlled.      Dillon Boast, PA-C  08/27/2014 9:05 AM    [facility] [address], [place], [place]  [address] Phone: [phone]; Fax: [phone]

## 2014-08-27 NOTE — Assessment & Plan Note (Signed)
Blood pressure is up today. Increase metoprolol to 50 mg once daily. Low-sodium diet.

## 2014-08-27 NOTE — Patient Instructions (Signed)
  START TAKING METOPROLOL 50 MG ONCE A DAY    FOLLOW UP WITH DR BRACKBILL 3 TO 4 WEEKS FOR POSSIBLE  CARDIOVERSION

## 2014-08-27 NOTE — Assessment & Plan Note (Signed)
Patient remains in atrial fibrillation at 90 bpm. I will increase his Toprol to 50 mg once daily for better rate control. He is only taken Eliquis once daily because of cost. I asked him to call his insurance company to see which would be the most reasonable cost between Eliquis, Pradaxa, and Xarelto. He is to contact us and let us know. If he can afford these Coumadin will have to be a consideration. In the meantime he is advised to take Eliquis twice daily. Follow-up with Dr. Mare Ferrari one month for possible cardioversion.

## 2014-08-27 NOTE — Assessment & Plan Note (Signed)
Patient has had no further chest pain. He will need a stress Myoview eventually but will hold off until his atrial fibrillation is better controlled.

## 2014-09-26 ENCOUNTER — Ambulatory Visit: Payer: Medicare Other | Admitting: Cardiology

## 2014-10-03 ENCOUNTER — Encounter: Payer: Self-pay | Admitting: Cardiology

## 2014-12-31 ENCOUNTER — Other Ambulatory Visit: Payer: Self-pay

## 2015-04-09 ENCOUNTER — Encounter: Payer: Self-pay | Admitting: Emergency Medicine

## 2015-05-12 ENCOUNTER — Other Ambulatory Visit: Payer: Self-pay | Admitting: Family Medicine

## 2015-07-18 ENCOUNTER — Other Ambulatory Visit: Payer: Self-pay | Admitting: Physician Assistant

## 2015-08-18 ENCOUNTER — Other Ambulatory Visit: Payer: Self-pay | Admitting: Physician Assistant

## 2015-08-30 ENCOUNTER — Other Ambulatory Visit: Payer: Self-pay | Admitting: Physician Assistant

## 2015-10-16 ENCOUNTER — Other Ambulatory Visit: Payer: Self-pay | Admitting: Physician Assistant

## 2015-10-16 NOTE — Telephone Encounter (Signed)
Pt made appt for 10-23-15, needs refills until then

## 2015-10-23 ENCOUNTER — Encounter: Payer: Self-pay | Admitting: Nurse Practitioner

## 2015-10-23 ENCOUNTER — Ambulatory Visit (INDEPENDENT_AMBULATORY_CARE_PROVIDER_SITE_OTHER): Payer: Medicare Other | Admitting: Nurse Practitioner

## 2015-10-23 VITALS — BP 168/110 | HR 92 | Ht 68.0 in | Wt 229.1 lb

## 2015-10-23 DIAGNOSIS — E785 Hyperlipidemia, unspecified: Secondary | ICD-10-CM

## 2015-10-23 DIAGNOSIS — I10 Essential (primary) hypertension: Secondary | ICD-10-CM

## 2015-10-23 DIAGNOSIS — I4891 Unspecified atrial fibrillation: Secondary | ICD-10-CM

## 2015-10-23 LAB — BASIC METABOLIC PANEL
BUN: 25 mg/dL (ref 7–25)
CO2: 27 mmol/L (ref 20–31)
Calcium: 8.7 mg/dL (ref 8.6–10.3)
Chloride: 103 mmol/L (ref 98–110)
Creat: 1.43 mg/dL — ABNORMAL HIGH (ref 0.70–1.25)
Glucose, Bld: 73 mg/dL (ref 65–99)
Potassium: 4.2 mmol/L (ref 3.5–5.3)
Sodium: 140 mmol/L (ref 135–146)

## 2015-10-23 MED ORDER — LISINOPRIL 10 MG PO TABS: 10.0000 mg | ORAL_TABLET | Freq: Every day | ORAL | Status: DC

## 2015-10-23 MED ORDER — RIVAROXABAN 20 MG PO TABS: 20.0000 mg | ORAL_TABLET | Freq: Every day | ORAL | Status: DC

## 2015-10-23 NOTE — Progress Notes (Signed)
CARDIOLOGY OFFICE NOTE  Date:  10/23/2015    Dillon Perkins Date of Birth: [birth year] Medical Record [id number]  PCP:  Jenny Reichmann, MD  Cardiologist:  Former patient of Dr. Maren Beach    Chief Complaint  Patient presents with  . Hyperlipidemia  . Hypertension  . Atrial Fibrillation    14 month check - former patient of Dr. Maren Beach    History of Present Illness: Dillon Perkins is a 64 y.o. male who presents today for a 14 month check. Former patient of Dr. Maren Beach.   He has a history of AF, HTN, CKD and HLD. He is on chronic anticoagulation with Eliquis.   Last seen here in February of 2016 and was felt to be doing ok. He had ran out of his medicines back in 2015 - got them refilled and ended up in the ER with dizziness/hypotension - was in AF and started on Eliquis. When seen back at last follow up - he was not taking his Eliquis twice a day due to the cost. Did have complaint of chest pain and was going to have Myoview arranged once his AF was taken care of - this never happened. Discussion was for possible cardioversion however the patient has not returned to this office since February of 2016.   Comes back today. Here alone. Says he is wanting to get on track. He feels "fine". No chest pain. Breathing is good. Not short of breath. No swelling. No awareness of his atrial fib. He stopped his Eliquis due to cost. Taking aspirin in its place. BP is high - he does not monitor at home but has a cuff. Probably gets too much salt. Drinking scotch every night.   Past Medical History  Diagnosis Date  . Atrial fibrillation ([place])   . Chest pain   . Hyperlipidemia   . Hypertension     Past Surgical History  Procedure Laterality Date  . Appendectomy       Medications: Current Outpatient Prescriptions  Medication Sig Dispense Refill  . aspirin 81 MG tablet Take 162 mg by mouth daily.    Marland Kitchen ibuprofen (ADVIL,MOTRIN) 200 MG tablet Take 200 mg by mouth every 6 (six)  hours as needed for headache.    . Ibuprofen-Diphenhydramine Cit (ADVIL PM PO) Take 1 tablet by mouth as needed.    . metoprolol succinate (TOPROL-XL) 50 MG 24 hr tablet TAKE 1 TABLET (50 MG TOTAL) BY MOUTH DAILY. 30 tablet 0  . apixaban (ELIQUIS) 5 MG TABS tablet Take 1 tablet (5 mg total) by mouth 2 (two) times daily. (Patient not taking: Reported on 10/23/2015) 60 tablet 0   No current facility-administered medications for this visit.    Allergies: No Known Allergies  Social History: The patient  reports that he quit smoking about 43 years ago. His smoking use included Cigarettes. He has a .5 pack-year smoking history. He has never used smokeless tobacco. He reports that he drinks about 1.2 oz of alcohol per week. He reports that he does not use illicit drugs.   Family History: The patient's family history includes Breast cancer in his mother; Heart disease in his father and sister.   Review of Systems: Please see the history of present illness.   Otherwise, the review of systems is positive for none.   All other systems are reviewed and negative.   Physical Exam: VS:  BP 168/110 mmHg  Pulse 92  Ht 5\' 8"  (1.727 m)  Wt 229 lb  1.9 oz (103.928 kg)  BMI 34.85 kg/m2 .  BMI Body mass index is 34.85 kg/(m^2).  Wt Readings from Last 3 Encounters:  10/23/15 229 lb 1.9 oz (103.928 kg)  08/27/14 231 lb (104.781 kg)  07/22/14 230 lb 6.1 oz (104.5 kg)    General: Alert. He is in no acute distress. Face is ruddy. He is obese.  HEENT: Normal. Neck: Supple, no JVD, carotid bruits, or masses noted.  Cardiac: Irregular irregular rhythm. Rate ok. No murmurs, rubs, or gallops. No edema.  Respiratory:  Lungs are clear to auscultation bilaterally with normal work of breathing.  GI: Soft and nontender.  MS: No deformity or atrophy. Gait and ROM intact. Skin: Warm and dry. Color is normal.  Neuro:  Strength and sensation are intact and no gross focal deficits noted.  Psych: Alert, appropriate and  with normal affect.   LABORATORY DATA:  EKG:  EKG is ordered today. This demonstrates atrial fib with a VR of 92.  Lab Results  Component Value Date   WBC 8.8 07/23/2014   HGB 13.8 07/23/2014   HCT 41.2 07/23/2014   PLT 150 07/23/2014   GLUCOSE 150* 07/23/2014   CHOL 133 07/23/2014   TRIG 122 07/23/2014   HDL 35* 07/23/2014   LDLCALC 74 07/23/2014   ALT 49 07/22/2014   AST 49* 07/22/2014   NA 137 07/23/2014   K 4.4 07/23/2014   CL 109 07/23/2014   CREATININE 1.78* 07/23/2014   BUN 39* 07/23/2014   CO2 20 07/23/2014   TSH 2.065 07/22/2014   PSA 2.00 09/14/2013   HGBA1C 5.7* 07/22/2014    BNP (last 3 results) No results for input(s): BNP in the last 8760 hours.  ProBNP (last 3 results) No results for input(s): PROBNP in the last 8760 hours.   Other Studies Reviewed Today:  2-D echo 07/23/14: Study Conclusions  - Left ventricle: The cavity size was normal. Wall thickness was normal. Systolic function was mildly reduced. The estimated ejection fraction was in the range of 45% to 50%. Wall motion was normal; there were no regional wall motion abnormalities. - Mitral valve: Calcified annulus. There was mild regurgitation. - Left atrium: The atrium was severely dilated. - Right atrium: The atrium was moderately dilated   Assessment/Plan:  Atrial fibrillation Looks to now be chronic. Has not kept follow up in order to proceed with cardioversion. He has severe LAE and RAE - I think this makes it very unlikely that he will be able to revert back to NSR. Not on anticoagulation but says he is now willing to take. He has Medicare Part D - will try Xarelto. Needs BMET today but will start at 20 mg a day. Samples provided. Talked about possible coumadin but he does not wish to have checked. I am still worried about long term compliance.    Essential hypertension BP is not controlled. Adding Lisinopril 10 mg a day. BMET today to recheck kidney function.   Chest  pain Patient has had no further chest pain. I do not see a need to proceed with stress testing at this time.   Current medicines are reviewed with the patient today.  The patient does not have concerns regarding medicines other than what has been noted above.  The following changes have been made:  See above.  Labs/ tests ordered today include:   No orders of the defined types were placed in this encounter.     Disposition:   FU with me in about 2 weeks.  BMET today. Long discussion about compliance today.   Patient is agreeable to this plan and will call if any problems develop in the interim.   Signed: Burtis Junes, RN, ANP-C 10/23/2015 3:38 PM  [facility] [address] [place], [place]  [address] Phone: [phone] Fax: [phone]

## 2015-10-23 NOTE — Patient Instructions (Addendum)
We will be checking the following labs today - NONE  We will do fasting labs when you come back.  Medication Instructions:    Continue with your current medicines. BUT  I am adding Xarelto 20 mg to take in the evening with your largest meal - this is just once a day  I am adding Lisinopril 10 mg a day - this is for your blood pressure.   STOP aspirin on Saturday  Use Tylenol for discomfort - avoid Advil, Aleve, Ibuprofen, etc.      Testing/Procedures To Be Arranged:  N/A  Follow-Up:   See me in about 2 weeks with fasting labs    Other Special Instructions:   Try to restrict your salt   Monitor your blood pressure for me    If you need a refill on your cardiac medications before your next appointment, please call your pharmacy.   Call the [place] office at [phone] if you have any questions, problems or concerns.

## 2015-11-06 ENCOUNTER — Ambulatory Visit: Payer: Medicare Other | Admitting: Nurse Practitioner

## 2015-11-11 ENCOUNTER — Encounter: Payer: Self-pay | Admitting: *Deleted

## 2015-11-12 ENCOUNTER — Other Ambulatory Visit: Payer: Self-pay | Admitting: Physician Assistant

## 2015-12-27 ENCOUNTER — Ambulatory Visit (INDEPENDENT_AMBULATORY_CARE_PROVIDER_SITE_OTHER): Payer: Medicare Other

## 2015-12-27 ENCOUNTER — Ambulatory Visit (INDEPENDENT_AMBULATORY_CARE_PROVIDER_SITE_OTHER): Payer: Medicare Other | Admitting: Family Medicine

## 2015-12-27 VITALS — BP 124/80 | HR 91 | Temp 98.1°F | Resp 18 | Ht 68.0 in | Wt 232.8 lb

## 2015-12-27 DIAGNOSIS — R05 Cough: Secondary | ICD-10-CM

## 2015-12-27 DIAGNOSIS — R058 Other specified cough: Secondary | ICD-10-CM

## 2015-12-27 DIAGNOSIS — R059 Cough, unspecified: Secondary | ICD-10-CM

## 2015-12-27 MED ORDER — OMEPRAZOLE 20 MG PO CPDR: 20.0000 mg | DELAYED_RELEASE_CAPSULE | Freq: Every day | ORAL | Status: DC

## 2015-12-27 NOTE — Progress Notes (Addendum)
By signing my name below, I, [name], attest that this documentation has been prepared under the direction and in the presence of [name], MD.  Electronically Signed: [name], Medical Scribe. 12/27/2015. 1:25 PM.  Subjective:    Patient ID: Dillon Perkins, male    DOB: Nov 10, 1950, 65 y.o.   MRN: [id number]  HPI Chief Complaint  Patient presents with  . Cough    x 37months.productive. Hx of pneumonia.     HPI Comments: Dillon Perkins is a 65 y.o. male with a PMHx of HTN, and A. Fib. who presents to the [facility] complaining of cough onset 2 months. Pt has a Shx of tobacco abbuse and quit in 1974. Pt was most recently seen by cardiology in April. Medication non adhearent in the past. Most recent echo Jan 2016- EF 45% to 50%. He was started on Xarelto in April for A. Fib. Pt had PNA in 2014. When he came here to [facility] to get it checked out, his CXR showed scar tissue in his lungs from previous times he's had PNA. Pt gets throat irritation, that doesn't hurt, every 30 mins that causes him to cough; it's mainly dry but occasionally can be productive. Pt states he doesn't know how to explain it. Pt has frequent throat clearing. Pt took Mucinex for relief to his symptoms in the past but hasn't been taking it recently. Pt states sweets worsen his cough, especially chocolate from time to time. Pt has about 1 cocktail a night. Pt denies sore throat, night sweats, fever, unexpected weight change, SOB, and beltching. Pt denies heartburn and hasn't taken medication for heartburn.  Patient Active Problem List   Diagnosis Date Noted  . Chest pain   . Acute renal insufficiency   . Paroxysmal atrial fibrillation ([facility])   . Bradycardia 07/22/2014  . Exercise intolerance 09/24/2012  . Abnormal CXR 09/06/2012  . Abnormal LFTs (liver function tests) 02/07/2012  . Hyperlipidemia 02/07/2012  . Atrial fibrillation ([place]) 01/31/2012  . Snoring 01/31/2012  . Essential  hypertension 08/29/2010   Past Medical History  Diagnosis Date  . Atrial fibrillation ([place])   . Chest pain   . Hyperlipidemia   . Hypertension    Past Surgical History  Procedure Laterality Date  . Appendectomy     No Known Allergies Prior to Admission medications   Medication Sig Start Date End Date Taking? Authorizing Provider  lisinopril (PRINIVIL,ZESTRIL) 10 MG tablet Take 1 tablet (10 mg total) by mouth daily. 10/23/15  Yes Burtis Junes, NP  metoprolol succinate (TOPROL-XL) 50 MG 24 hr tablet TAKE 1 TABLET BY MOUTH DAILY 11/12/15  Yes Imogene Burn, PA-C  rivaroxaban (XARELTO) 20 MG TABS tablet Take 1 tablet (20 mg total) by mouth daily with supper. 10/23/15  Yes Burtis Junes, NP   Social History   Social History  . Marital Status: Married    Spouse Name: N/A  . Number of Children: N/A  . Years of Education: N/A   Occupational History  . Not on file.   Social History Main Topics  . Smoking status: Former Smoker -- 1.00 packs/day for .5 years    Types: Cigarettes    Quit date: 08/21/1972  . Smokeless tobacco: Never Used  . Alcohol Use: 1.2 oz/week    2 Cans of beer per week  . Drug Use: No  . Sexual Activity: Not on file   Other Topics Concern  . Not on file   Social History  Narrative   Review of Systems  Constitutional: Negative for fever, diaphoresis and unexpected weight change.  HENT: Positive for rhinorrhea. Negative for congestion and sore throat.   Respiratory: Positive for cough. Negative for shortness of breath.   Allergic/Immunologic: Negative for environmental allergies.   Objective:  BP 124/80 mmHg  Pulse 91  Temp(Src) 98.1 F (36.7 C) (Oral)  Resp 18  Ht 5\' 8"  (1.727 m)  Wt 232 lb 12.8 oz (105.597 kg)  BMI 35.41 kg/m2  SpO2 96%  Physical Exam  Constitutional: He is oriented to person, place, and time. He appears well-developed and well-nourished.  HENT:  Head: Normocephalic and atraumatic.  Right Ear: Tympanic membrane, external  ear and ear canal normal.  Left Ear: Tympanic membrane, external ear and ear canal normal.  Nose: No rhinorrhea.  Mouth/Throat: Oropharynx is clear and moist and mucous membranes are normal. No oropharyngeal exudate or posterior oropharyngeal erythema.  Minimal edema of the turbinates  Eyes: Conjunctivae and EOM are normal. Pupils are equal, round, and reactive to light.  Neck: Neck supple. No JVD present. Carotid bruit is not present.  Cardiovascular: Normal rate, normal heart sounds and intact distal pulses.  An irregularly irregular rhythm present.  No murmur heard. Pulmonary/Chest: Effort normal and breath sounds normal. He has no wheezes. He has no rhonchi. He has no rales.  Abdominal: Soft. There is no tenderness.  Musculoskeletal: He exhibits no edema.  Lymphadenopathy:    He has no cervical adenopathy.  Neurological: He is alert and oriented to person, place, and time.  Skin: Skin is warm and dry. No rash noted.  Psychiatric: He has a normal mood and affect. His behavior is normal.  Vitals reviewed.  Dg Chest 2 View  12/27/2015  CLINICAL DATA:  Cough for 2 months. EXAM: CHEST  2 VIEW COMPARISON:  07/22/2014 FINDINGS: The lungs are clear wiithout focal pneumonia, edema, pneumothorax or pleural effusion. The cardiopericardial silhouette is within normal limits for size. The visualized bony structures of the thorax are intact. IMPRESSION: No active cardiopulmonary disease. Electronically Signed   By: Misty Stanley M.D.   On: 12/27/2015 13:50     Assessment & Plan:   Dillon Perkins is a 65 y.o. male Cough - Plan: DG Chest 2 View, omeprazole (PRILOSEC) 20 MG capsule  Upper airway cough syndrome - Plan: omeprazole (PRILOSEC) 20 MG capsule  Suspected upper airway cough syndrome. No concerning findings on chest x-ray. Will treat for 2 possible causes of either reflux or postnasal drip with allergic rhinitis. Start nonsedating antihistamine, and omeprazole daily. If improved, after 2  weeks can stop either one to see which was primary cause. Additionally recommended to avoid known GERD triggers. If no improvement in the next 2 weeks, then possible ACE inhibitor induced cough, and would change to ARB. RTC precautions if persistent.  Meds ordered this encounter  Medications  . omeprazole (PRILOSEC) 20 MG capsule    Sig: Take 1 capsule (20 mg total) by mouth daily.    Dispense:  30 capsule    Refill:  1   Patient Instructions       IF you received an x-ray today, you will receive an invoice from [facility]. Please contact [facility] at [phone] with questions or concerns regarding your invoice.   IF you received labwork today, you will receive an invoice from [employer]. Please contact [facility] at [phone] with questions or concerns regarding your invoice.   Our billing staff will not be able to  assist you with questions regarding bills from these companies.  You will be contacted with the lab results as soon as they are available. The fastest way to get your results is to activate your My Chart account. Instructions are located on the last page of this paperwork. If you have not heard from [place] regarding the results in 2 weeks, please contact this office.     Your x-ray appears normal. The type of cough you're having may be coming from multiple causes, but as we discussed, I think of 3 common causes. Allergies, reflux, or ACE inhibitors.. For now, start omeprazole once per day, and Claritin over-the-counter once per day. Avoid foods as listed below that may trigger reflux. As your symptoms improve, you can stop the Claritin or omeprazole, but if symptoms return, then would restart the medication that seemed to work. If neither one of these medications help cough, then we will need to change lisinopril to different medication. Let me know in the next 2 weeks if this is not improving, and we'll change that medication.  I  personally performed the services described in this documentation, which was scribed in my presence. The recorded information has been reviewed and considered, and addended by me as needed.   Cough, Adult Coughing is a reflex that clears your throat and your airways. Coughing helps to heal and protect your lungs. It is normal to cough occasionally, but a cough that happens with other symptoms or lasts a long time may be a sign of a condition that needs treatment. A cough may last only 2-3 weeks (acute), or it may last longer than 8 weeks (chronic). CAUSES Coughing is commonly caused by:  Breathing in substances that irritate your lungs.  A viral or bacterial respiratory infection.  Allergies.  Asthma.  Postnasal drip.  Smoking.  Acid backing up from the stomach into the esophagus (gastroesophageal reflux).  Certain medicines.  Chronic lung problems, including COPD (or rarely, lung cancer).  Other medical conditions such as heart failure. HOME CARE INSTRUCTIONS  Pay attention to any changes in your symptoms. Take these actions to help with your discomfort:  Take medicines only as told by your health care provider.  If you were prescribed an antibiotic medicine, take it as told by your health care provider. Do not stop taking the antibiotic even if you start to feel better.  Talk with your health care provider before you take a cough suppressant medicine.  Drink enough fluid to keep your urine clear or pale yellow.  If the air is dry, use a cold steam vaporizer or humidifier in your bedroom or your home to help loosen secretions.  Avoid anything that causes you to cough at work or at home.  If your cough is worse at night, try sleeping in a semi-upright position.  Avoid cigarette smoke. If you smoke, quit smoking. If you need help quitting, ask your health care provider.  Avoid caffeine.  Avoid alcohol.  Rest as needed. SEEK MEDICAL CARE IF:   You have new  symptoms.  You cough up pus.  Your cough does not get better after 2-3 weeks, or your cough gets worse.  You cannot control your cough with suppressant medicines and you are losing sleep.  You develop pain that is getting worse or pain that is not controlled with pain medicines.  You have a fever.  You have unexplained weight loss.  You have night sweats. SEEK IMMEDIATE MEDICAL CARE IF:  You cough up blood.  You have difficulty breathing.  Your heartbeat is very fast.   This information is not intended to replace advice given to you by your health care provider. Make sure you discuss any questions you have with your health care provider.   Document Released: 12/19/2010 Document Revised: 03/13/2015 Document Reviewed: 08/29/2014 Elsevier Interactive Patient Education 2016 [place] for Gastroesophageal Reflux Disease, Adult When you have gastroesophageal reflux disease (GERD), the foods you eat and your eating habits are very important. Choosing the right foods can help ease the discomfort of GERD. WHAT GENERAL GUIDELINES DO I NEED TO FOLLOW?  Choose fruits, vegetables, whole grains, low-fat dairy products, and low-fat meat, fish, and poultry.  Limit fats such as oils, salad dressings, butter, nuts, and avocado.  Keep a food diary to identify foods that cause symptoms.  Avoid foods that cause reflux. These may be different for different people.  Eat frequent small meals instead of three large meals each day.  Eat your meals slowly, in a relaxed setting.  Limit fried foods.  Cook foods using methods other than frying.  Avoid drinking alcohol.  Avoid drinking large amounts of liquids with your meals.  Avoid bending over or lying down until 2-3 hours after eating. WHAT FOODS ARE NOT RECOMMENDED? The following are some foods and drinks that may worsen your symptoms: Vegetables Tomatoes. Tomato juice. Tomato and spaghetti sauce. Chili peppers.  Onion and garlic. Horseradish. Fruits Oranges, grapefruit, and lemon (fruit and juice). Meats High-fat meats, fish, and poultry. This includes hot dogs, ribs, ham, sausage, salami, and bacon. Dairy Whole milk and chocolate milk. Sour cream. Cream. Butter. Ice cream. Cream cheese.  Beverages Coffee and tea, with or without caffeine. Carbonated beverages or energy drinks. Condiments Hot sauce. Barbecue sauce.  Sweets/Desserts Chocolate and cocoa. Donuts. Peppermint and spearmint. Fats and Oils High-fat foods, including [place] fries and potato chips. Other Vinegar. Strong spices, such as black pepper, white pepper, red pepper, cayenne, curry powder, cloves, ginger, and chili powder. The items listed above may not be a complete list of foods and beverages to avoid. Contact your dietitian for more information.   This information is not intended to replace advice given to you by your health care provider. Make sure you discuss any questions you have with your health care provider.   Document Released: 06/22/2005 Document Revised: 07/13/2014 Document Reviewed: 04/26/2013 Elsevier Interactive Patient Education [employer].         I personally performed the services described in this documentation, which was scribed in my presence. The recorded information has been reviewed and considered, and addended by me as needed.   Signed,   [name], MD [facility].  12/27/2015 2:03 PM

## 2015-12-27 NOTE — Patient Instructions (Addendum)
IF you received an x-ray today, you will receive an invoice from [facility]. Please contact [facility] at [phone] with questions or concerns regarding your invoice.   IF you received labwork today, you will receive an invoice from [employer]. Please contact [facility] at [phone] with questions or concerns regarding your invoice.   Our billing staff will not be able to assist you with questions regarding bills from these companies.  You will be contacted with the lab results as soon as they are available. The fastest way to get your results is to activate your My Chart account. Instructions are located on the last page of this paperwork. If you have not heard from [place] regarding the results in 2 weeks, please contact this office.     Your x-ray appears normal. The type of cough you're having may be coming from multiple causes, but as we discussed, I think of 3 common causes. Allergies, reflux, or ACE inhibitors.. For now, start omeprazole once per day, and Claritin over-the-counter once per day. Avoid foods as listed below that may trigger reflux. As your symptoms improve, you can stop the Claritin or omeprazole, but if symptoms return, then would restart the medication that seemed to work. If neither one of these medications help cough, then we will need to change lisinopril to different medication. Let me know in the next 2 weeks if this is not improving, and we'll change that medication.  I personally performed the services described in this documentation, which was scribed in my presence. The recorded information has been reviewed and considered, and addended by me as needed.   Cough, Adult Coughing is a reflex that clears your throat and your airways. Coughing helps to heal and protect your lungs. It is normal to cough occasionally, but a cough that happens with other symptoms or lasts a long time may be a sign of a condition that needs  treatment. A cough may last only 2-3 weeks (acute), or it may last longer than 8 weeks (chronic). CAUSES Coughing is commonly caused by:  Breathing in substances that irritate your lungs.  A viral or bacterial respiratory infection.  Allergies.  Asthma.  Postnasal drip.  Smoking.  Acid backing up from the stomach into the esophagus (gastroesophageal reflux).  Certain medicines.  Chronic lung problems, including COPD (or rarely, lung cancer).  Other medical conditions such as heart failure. HOME CARE INSTRUCTIONS  Pay attention to any changes in your symptoms. Take these actions to help with your discomfort:  Take medicines only as told by your health care provider.  If you were prescribed an antibiotic medicine, take it as told by your health care provider. Do not stop taking the antibiotic even if you start to feel better.  Talk with your health care provider before you take a cough suppressant medicine.  Drink enough fluid to keep your urine clear or pale yellow.  If the air is dry, use a cold steam vaporizer or humidifier in your bedroom or your home to help loosen secretions.  Avoid anything that causes you to cough at work or at home.  If your cough is worse at night, try sleeping in a semi-upright position.  Avoid cigarette smoke. If you smoke, quit smoking. If you need help quitting, ask your health care provider.  Avoid caffeine.  Avoid alcohol.  Rest as needed. SEEK MEDICAL CARE IF:   You have new symptoms.  You cough up pus.  Your cough does not  get better after 2-3 weeks, or your cough gets worse.  You cannot control your cough with suppressant medicines and you are losing sleep.  You develop pain that is getting worse or pain that is not controlled with pain medicines.  You have a fever.  You have unexplained weight loss.  You have night sweats. SEEK IMMEDIATE MEDICAL CARE IF:  You cough up blood.  You have difficulty breathing.  Your  heartbeat is very fast.   This information is not intended to replace advice given to you by your health care provider. Make sure you discuss any questions you have with your health care provider.   Document Released: 12/19/2010 Document Revised: 03/13/2015 Document Reviewed: 08/29/2014 Elsevier Interactive Patient Education 2016 [place] for Gastroesophageal Reflux Disease, Adult When you have gastroesophageal reflux disease (GERD), the foods you eat and your eating habits are very important. Choosing the right foods can help ease the discomfort of GERD. WHAT GENERAL GUIDELINES DO I NEED TO FOLLOW?  Choose fruits, vegetables, whole grains, low-fat dairy products, and low-fat meat, fish, and poultry.  Limit fats such as oils, salad dressings, butter, nuts, and avocado.  Keep a food diary to identify foods that cause symptoms.  Avoid foods that cause reflux. These may be different for different people.  Eat frequent small meals instead of three large meals each day.  Eat your meals slowly, in a relaxed setting.  Limit fried foods.  Cook foods using methods other than frying.  Avoid drinking alcohol.  Avoid drinking large amounts of liquids with your meals.  Avoid bending over or lying down until 2-3 hours after eating. WHAT FOODS ARE NOT RECOMMENDED? The following are some foods and drinks that may worsen your symptoms: Vegetables Tomatoes. Tomato juice. Tomato and spaghetti sauce. Chili peppers. Onion and garlic. Horseradish. Fruits Oranges, grapefruit, and lemon (fruit and juice). Meats High-fat meats, fish, and poultry. This includes hot dogs, ribs, ham, sausage, salami, and bacon. Dairy Whole milk and chocolate milk. Sour cream. Cream. Butter. Ice cream. Cream cheese.  Beverages Coffee and tea, with or without caffeine. Carbonated beverages or energy drinks. Condiments Hot sauce. Barbecue sauce.  Sweets/Desserts Chocolate and cocoa. Donuts.  Peppermint and spearmint. Fats and Oils High-fat foods, including [place] fries and potato chips. Other Vinegar. Strong spices, such as black pepper, white pepper, red pepper, cayenne, curry powder, cloves, ginger, and chili powder. The items listed above may not be a complete list of foods and beverages to avoid. Contact your dietitian for more information.   This information is not intended to replace advice given to you by your health care provider. Make sure you discuss any questions you have with your health care provider.   Document Released: 06/22/2005 Document Revised: 07/13/2014 Document Reviewed: 04/26/2013 Elsevier Interactive Patient Education [employer].

## 2016-02-14 ENCOUNTER — Other Ambulatory Visit: Payer: Self-pay | Admitting: Family Medicine

## 2016-02-14 DIAGNOSIS — R05 Cough: Secondary | ICD-10-CM

## 2016-02-14 DIAGNOSIS — R059 Cough, unspecified: Secondary | ICD-10-CM

## 2016-02-14 DIAGNOSIS — R058 Other specified cough: Secondary | ICD-10-CM

## 2016-02-14 DIAGNOSIS — I10 Essential (primary) hypertension: Secondary | ICD-10-CM

## 2016-02-15 MED ORDER — LOSARTAN POTASSIUM 50 MG PO TABS: 50.0000 mg | ORAL_TABLET | Freq: Every day | ORAL | 2 refills | Status: DC

## 2016-02-15 NOTE — Telephone Encounter (Signed)
We can try changing off lisinopril to see if this helps with cough. Stop lisinopril, start losartan 50 mg each day. Keep an eye on blood pressures, and if remaining over 140/90, can increase to 2 pills each day of the losartan. Follow-up with me in the next 2-3 weeks of cough is not improved to look into other causes. Let me know if there are any questions.

## 2016-02-15 NOTE — Telephone Encounter (Signed)
Spoke with Dillon Perkins, he states he is still coughing and would like to switch blood pressure medications.   He states he does not want the Prilosec refilled

## 2016-02-17 NOTE — Telephone Encounter (Signed)
Left detailed instr's w/Dr Greene's plan below (OK per HIPPA). Asked pt to CB w/any ?s about new med or if he needs to increase to 2 tabs QD so that we can send in new Rx at that dose. Advised to RTC if pt continues to cough or if his BP is not controlled at either strength of losartan.

## 2016-02-28 DIAGNOSIS — I4891 Unspecified atrial fibrillation: Secondary | ICD-10-CM | POA: Diagnosis not present

## 2016-02-28 DIAGNOSIS — Z23 Encounter for immunization: Secondary | ICD-10-CM | POA: Diagnosis not present

## 2016-02-28 DIAGNOSIS — R05 Cough: Secondary | ICD-10-CM | POA: Diagnosis not present

## 2016-02-28 DIAGNOSIS — I10 Essential (primary) hypertension: Secondary | ICD-10-CM | POA: Diagnosis not present

## 2016-02-28 DIAGNOSIS — Z8701 Personal history of pneumonia (recurrent): Secondary | ICD-10-CM | POA: Diagnosis not present

## 2016-03-14 ENCOUNTER — Other Ambulatory Visit: Payer: Self-pay | Admitting: Nurse Practitioner

## 2016-04-22 DIAGNOSIS — C44219 Basal cell carcinoma of skin of left ear and external auricular canal: Secondary | ICD-10-CM | POA: Diagnosis not present

## 2016-04-22 DIAGNOSIS — L821 Other seborrheic keratosis: Secondary | ICD-10-CM | POA: Diagnosis not present

## 2016-04-22 DIAGNOSIS — C44612 Basal cell carcinoma of skin of right upper limb, including shoulder: Secondary | ICD-10-CM | POA: Diagnosis not present

## 2016-04-22 DIAGNOSIS — L738 Other specified follicular disorders: Secondary | ICD-10-CM | POA: Diagnosis not present

## 2016-04-22 DIAGNOSIS — D485 Neoplasm of uncertain behavior of skin: Secondary | ICD-10-CM | POA: Diagnosis not present

## 2016-04-22 DIAGNOSIS — C44319 Basal cell carcinoma of skin of other parts of face: Secondary | ICD-10-CM | POA: Diagnosis not present

## 2016-04-22 DIAGNOSIS — Z23 Encounter for immunization: Secondary | ICD-10-CM | POA: Diagnosis not present

## 2016-04-22 DIAGNOSIS — C44519 Basal cell carcinoma of skin of other part of trunk: Secondary | ICD-10-CM | POA: Diagnosis not present

## 2016-05-12 DIAGNOSIS — C44519 Basal cell carcinoma of skin of other part of trunk: Secondary | ICD-10-CM | POA: Diagnosis not present

## 2016-06-12 DIAGNOSIS — R944 Abnormal results of kidney function studies: Secondary | ICD-10-CM | POA: Diagnosis not present

## 2016-06-12 DIAGNOSIS — I10 Essential (primary) hypertension: Secondary | ICD-10-CM | POA: Diagnosis not present

## 2016-06-12 DIAGNOSIS — I4891 Unspecified atrial fibrillation: Secondary | ICD-10-CM | POA: Diagnosis not present

## 2016-06-19 ENCOUNTER — Ambulatory Visit: Payer: Medicare Other | Admitting: Cardiology

## 2016-07-02 ENCOUNTER — Encounter: Payer: Self-pay | Admitting: Cardiology

## 2016-07-07 DIAGNOSIS — I482 Chronic atrial fibrillation: Secondary | ICD-10-CM | POA: Diagnosis not present

## 2016-07-07 DIAGNOSIS — E668 Other obesity: Secondary | ICD-10-CM | POA: Diagnosis not present

## 2016-07-07 DIAGNOSIS — I2510 Atherosclerotic heart disease of native coronary artery without angina pectoris: Secondary | ICD-10-CM | POA: Diagnosis not present

## 2016-07-07 DIAGNOSIS — I10 Essential (primary) hypertension: Secondary | ICD-10-CM | POA: Diagnosis not present

## 2016-07-27 DIAGNOSIS — I482 Chronic atrial fibrillation: Secondary | ICD-10-CM | POA: Diagnosis not present

## 2016-07-30 DIAGNOSIS — C44319 Basal cell carcinoma of skin of other parts of face: Secondary | ICD-10-CM | POA: Diagnosis not present

## 2016-08-06 DIAGNOSIS — I10 Essential (primary) hypertension: Secondary | ICD-10-CM | POA: Diagnosis not present

## 2016-08-06 DIAGNOSIS — E668 Other obesity: Secondary | ICD-10-CM | POA: Diagnosis not present

## 2016-08-06 DIAGNOSIS — E785 Hyperlipidemia, unspecified: Secondary | ICD-10-CM | POA: Diagnosis not present

## 2016-08-06 DIAGNOSIS — I482 Chronic atrial fibrillation: Secondary | ICD-10-CM | POA: Diagnosis not present

## 2016-08-06 DIAGNOSIS — I2510 Atherosclerotic heart disease of native coronary artery without angina pectoris: Secondary | ICD-10-CM | POA: Diagnosis not present

## 2016-08-18 DIAGNOSIS — C44219 Basal cell carcinoma of skin of left ear and external auricular canal: Secondary | ICD-10-CM | POA: Diagnosis not present

## 2016-09-03 DIAGNOSIS — E668 Other obesity: Secondary | ICD-10-CM | POA: Diagnosis not present

## 2016-09-03 DIAGNOSIS — I119 Hypertensive heart disease without heart failure: Secondary | ICD-10-CM | POA: Diagnosis not present

## 2016-09-03 DIAGNOSIS — E785 Hyperlipidemia, unspecified: Secondary | ICD-10-CM | POA: Diagnosis not present

## 2016-09-03 DIAGNOSIS — I2510 Atherosclerotic heart disease of native coronary artery without angina pectoris: Secondary | ICD-10-CM | POA: Diagnosis not present

## 2016-09-03 DIAGNOSIS — I482 Chronic atrial fibrillation: Secondary | ICD-10-CM | POA: Diagnosis not present

## 2016-09-03 DIAGNOSIS — I10 Essential (primary) hypertension: Secondary | ICD-10-CM | POA: Diagnosis not present

## 2017-06-03 DIAGNOSIS — Z23 Encounter for immunization: Secondary | ICD-10-CM | POA: Diagnosis not present

## 2017-06-03 DIAGNOSIS — E7800 Pure hypercholesterolemia, unspecified: Secondary | ICD-10-CM | POA: Diagnosis not present

## 2017-06-03 DIAGNOSIS — I4891 Unspecified atrial fibrillation: Secondary | ICD-10-CM | POA: Diagnosis not present

## 2017-06-03 DIAGNOSIS — I10 Essential (primary) hypertension: Secondary | ICD-10-CM | POA: Diagnosis not present

## 2017-06-03 DIAGNOSIS — N183 Chronic kidney disease, stage 3 (moderate): Secondary | ICD-10-CM | POA: Diagnosis not present

## 2017-06-11 DIAGNOSIS — Z7901 Long term (current) use of anticoagulants: Secondary | ICD-10-CM | POA: Diagnosis not present

## 2017-06-11 DIAGNOSIS — I2510 Atherosclerotic heart disease of native coronary artery without angina pectoris: Secondary | ICD-10-CM | POA: Diagnosis not present

## 2017-06-11 DIAGNOSIS — E668 Other obesity: Secondary | ICD-10-CM | POA: Diagnosis not present

## 2017-06-11 DIAGNOSIS — E7849 Other hyperlipidemia: Secondary | ICD-10-CM | POA: Diagnosis not present

## 2017-06-11 DIAGNOSIS — I482 Chronic atrial fibrillation: Secondary | ICD-10-CM | POA: Diagnosis not present

## 2017-06-11 DIAGNOSIS — I119 Hypertensive heart disease without heart failure: Secondary | ICD-10-CM | POA: Diagnosis not present

## 2017-06-11 DIAGNOSIS — N183 Chronic kidney disease, stage 3 (moderate): Secondary | ICD-10-CM | POA: Diagnosis not present

## 2017-07-08 ENCOUNTER — Ambulatory Visit
Admission: RE | Admit: 2017-07-08 | Discharge: 2017-07-08 | Disposition: A | Discharge status: Home / Self Care | Payer: Medicare Other | Source: Ambulatory Visit | Attending: Family Medicine | Admitting: Family Medicine

## 2017-07-08 ENCOUNTER — Other Ambulatory Visit: Payer: Self-pay | Admitting: Family Medicine

## 2017-07-08 DIAGNOSIS — J209 Acute bronchitis, unspecified: Secondary | ICD-10-CM

## 2017-07-08 DIAGNOSIS — R05 Cough: Secondary | ICD-10-CM | POA: Diagnosis not present

## 2017-12-16 DIAGNOSIS — I482 Chronic atrial fibrillation: Secondary | ICD-10-CM | POA: Diagnosis not present

## 2017-12-23 DIAGNOSIS — E785 Hyperlipidemia, unspecified: Secondary | ICD-10-CM | POA: Diagnosis not present

## 2017-12-23 DIAGNOSIS — N183 Chronic kidney disease, stage 3 (moderate): Secondary | ICD-10-CM | POA: Diagnosis not present

## 2017-12-23 DIAGNOSIS — I482 Chronic atrial fibrillation: Secondary | ICD-10-CM | POA: Diagnosis not present

## 2017-12-23 DIAGNOSIS — E668 Other obesity: Secondary | ICD-10-CM | POA: Diagnosis not present

## 2017-12-23 DIAGNOSIS — I119 Hypertensive heart disease without heart failure: Secondary | ICD-10-CM | POA: Diagnosis not present

## 2017-12-23 DIAGNOSIS — I2510 Atherosclerotic heart disease of native coronary artery without angina pectoris: Secondary | ICD-10-CM | POA: Diagnosis not present

## 2017-12-23 DIAGNOSIS — Z7901 Long term (current) use of anticoagulants: Secondary | ICD-10-CM | POA: Diagnosis not present

## 2017-12-31 DIAGNOSIS — E668 Other obesity: Secondary | ICD-10-CM | POA: Diagnosis not present

## 2017-12-31 DIAGNOSIS — I482 Chronic atrial fibrillation: Secondary | ICD-10-CM | POA: Diagnosis not present

## 2017-12-31 DIAGNOSIS — I119 Hypertensive heart disease without heart failure: Secondary | ICD-10-CM | POA: Diagnosis not present

## 2017-12-31 DIAGNOSIS — I2510 Atherosclerotic heart disease of native coronary artery without angina pectoris: Secondary | ICD-10-CM | POA: Diagnosis not present

## 2017-12-31 DIAGNOSIS — E785 Hyperlipidemia, unspecified: Secondary | ICD-10-CM | POA: Diagnosis not present

## 2017-12-31 DIAGNOSIS — Z7901 Long term (current) use of anticoagulants: Secondary | ICD-10-CM | POA: Diagnosis not present

## 2017-12-31 DIAGNOSIS — N183 Chronic kidney disease, stage 3 (moderate): Secondary | ICD-10-CM | POA: Diagnosis not present

## 2018-01-03 DIAGNOSIS — I119 Hypertensive heart disease without heart failure: Secondary | ICD-10-CM | POA: Diagnosis not present

## 2018-01-03 DIAGNOSIS — I2510 Atherosclerotic heart disease of native coronary artery without angina pectoris: Secondary | ICD-10-CM | POA: Diagnosis not present

## 2018-01-03 DIAGNOSIS — E668 Other obesity: Secondary | ICD-10-CM | POA: Diagnosis not present

## 2018-01-03 DIAGNOSIS — E785 Hyperlipidemia, unspecified: Secondary | ICD-10-CM | POA: Diagnosis not present

## 2018-01-03 DIAGNOSIS — I482 Chronic atrial fibrillation: Secondary | ICD-10-CM | POA: Diagnosis not present

## 2018-01-03 DIAGNOSIS — N183 Chronic kidney disease, stage 3 (moderate): Secondary | ICD-10-CM | POA: Diagnosis not present

## 2018-01-03 DIAGNOSIS — Z7901 Long term (current) use of anticoagulants: Secondary | ICD-10-CM | POA: Diagnosis not present

## 2018-01-31 DIAGNOSIS — I2510 Atherosclerotic heart disease of native coronary artery without angina pectoris: Secondary | ICD-10-CM | POA: Diagnosis not present

## 2018-01-31 DIAGNOSIS — I119 Hypertensive heart disease without heart failure: Secondary | ICD-10-CM | POA: Diagnosis not present

## 2018-01-31 DIAGNOSIS — I482 Chronic atrial fibrillation: Secondary | ICD-10-CM | POA: Diagnosis not present

## 2018-01-31 DIAGNOSIS — E668 Other obesity: Secondary | ICD-10-CM | POA: Diagnosis not present

## 2018-01-31 DIAGNOSIS — Z7901 Long term (current) use of anticoagulants: Secondary | ICD-10-CM | POA: Diagnosis not present

## 2018-01-31 DIAGNOSIS — N183 Chronic kidney disease, stage 3 (moderate): Secondary | ICD-10-CM | POA: Diagnosis not present

## 2018-01-31 DIAGNOSIS — E785 Hyperlipidemia, unspecified: Secondary | ICD-10-CM | POA: Diagnosis not present

## 2018-03-14 DIAGNOSIS — N183 Chronic kidney disease, stage 3 (moderate): Secondary | ICD-10-CM | POA: Diagnosis not present

## 2018-03-14 DIAGNOSIS — E785 Hyperlipidemia, unspecified: Secondary | ICD-10-CM | POA: Diagnosis not present

## 2018-03-14 DIAGNOSIS — I2510 Atherosclerotic heart disease of native coronary artery without angina pectoris: Secondary | ICD-10-CM | POA: Diagnosis not present

## 2018-03-14 DIAGNOSIS — I119 Hypertensive heart disease without heart failure: Secondary | ICD-10-CM | POA: Diagnosis not present

## 2018-03-14 DIAGNOSIS — I482 Chronic atrial fibrillation: Secondary | ICD-10-CM | POA: Diagnosis not present

## 2018-03-14 DIAGNOSIS — E668 Other obesity: Secondary | ICD-10-CM | POA: Diagnosis not present

## 2018-03-14 DIAGNOSIS — Z7901 Long term (current) use of anticoagulants: Secondary | ICD-10-CM | POA: Diagnosis not present

## 2018-05-10 DIAGNOSIS — C44619 Basal cell carcinoma of skin of left upper limb, including shoulder: Secondary | ICD-10-CM | POA: Diagnosis not present

## 2018-05-10 DIAGNOSIS — Z23 Encounter for immunization: Secondary | ICD-10-CM | POA: Diagnosis not present

## 2018-05-10 DIAGNOSIS — D485 Neoplasm of uncertain behavior of skin: Secondary | ICD-10-CM | POA: Diagnosis not present

## 2018-05-10 DIAGNOSIS — C44519 Basal cell carcinoma of skin of other part of trunk: Secondary | ICD-10-CM | POA: Diagnosis not present

## 2018-05-10 DIAGNOSIS — L821 Other seborrheic keratosis: Secondary | ICD-10-CM | POA: Diagnosis not present

## 2018-05-10 DIAGNOSIS — D225 Melanocytic nevi of trunk: Secondary | ICD-10-CM | POA: Diagnosis not present

## 2018-05-10 DIAGNOSIS — D0439 Carcinoma in situ of skin of other parts of face: Secondary | ICD-10-CM | POA: Diagnosis not present

## 2018-05-30 DIAGNOSIS — L988 Other specified disorders of the skin and subcutaneous tissue: Secondary | ICD-10-CM | POA: Diagnosis not present

## 2018-05-30 DIAGNOSIS — C44519 Basal cell carcinoma of skin of other part of trunk: Secondary | ICD-10-CM | POA: Diagnosis not present

## 2018-07-11 ENCOUNTER — Ambulatory Visit: Payer: Medicare Other | Admitting: Cardiology

## 2018-08-09 ENCOUNTER — Ambulatory Visit: Payer: Medicare Other | Admitting: Cardiology

## 2018-08-30 DIAGNOSIS — L988 Other specified disorders of the skin and subcutaneous tissue: Secondary | ICD-10-CM | POA: Diagnosis not present

## 2018-08-30 DIAGNOSIS — C44619 Basal cell carcinoma of skin of left upper limb, including shoulder: Secondary | ICD-10-CM | POA: Diagnosis not present

## 2018-09-19 DIAGNOSIS — I4891 Unspecified atrial fibrillation: Secondary | ICD-10-CM | POA: Diagnosis not present

## 2018-09-19 DIAGNOSIS — I10 Essential (primary) hypertension: Secondary | ICD-10-CM | POA: Diagnosis not present

## 2018-09-19 DIAGNOSIS — E7800 Pure hypercholesterolemia, unspecified: Secondary | ICD-10-CM | POA: Diagnosis not present

## 2018-09-19 DIAGNOSIS — Z1211 Encounter for screening for malignant neoplasm of colon: Secondary | ICD-10-CM | POA: Diagnosis not present

## 2018-09-19 DIAGNOSIS — N183 Chronic kidney disease, stage 3 (moderate): Secondary | ICD-10-CM | POA: Diagnosis not present

## 2018-10-12 ENCOUNTER — Telehealth: Payer: Self-pay

## 2018-10-12 ENCOUNTER — Telehealth: Payer: Self-pay | Admitting: Nurse Practitioner

## 2018-10-12 NOTE — Telephone Encounter (Signed)
F/U Message          Patient is returning Stacy's call, pls call again

## 2018-10-12 NOTE — Telephone Encounter (Signed)
Left voicemail for patient to return call to the office.

## 2018-10-14 NOTE — Telephone Encounter (Signed)
Follow up:   Patient returning a call back to [facility] concerning a F/U appt. Please call patient back.

## 2018-10-17 ENCOUNTER — Ambulatory Visit: Payer: Medicare Other | Admitting: Cardiology

## 2018-10-31 NOTE — Telephone Encounter (Signed)
Looks like pt has already Can an appt with Dr Percival Spanish. I called pt back and offered an appt with Dr Delrae Sawyers and he states that he would like to be seen by Dr Percival Spanish. Scheduled an appt for 5-4 @130pm . Pt has BP and scale, email and MYCHART. Verbal consent obtained   YOUR CARDIOLOGY TEAM HAS ARRANGED FOR AN E-VISIT FOR YOUR APPOINTMENT - PLEASE REVIEW IMPORTANT INFORMATION BELOW SEVERAL DAYS PRIOR TO YOUR APPOINTMENT  Due to the recent COVID-19 pandemic, we are transitioning in-person office visits to tele-medicine visits in an effort to decrease unnecessary exposure to our patients, their families, and staff. These visits are billed to your insurance just like a normal visit is. We also encourage you to sign up for MyChart if you have not already done so. You will need a smartphone if possible. For patients that do not have this, we can still complete the visit using a regular telephone but do prefer a smartphone to enable video when possible. You may have a family member that lives with you that can help. If possible, we also ask that you have a blood pressure cuff and scale at home to measure your blood pressure, heart rate and weight prior to your scheduled appointment. Patients with clinical needs that need an in-person evaluation and testing will still be able to come to the office if absolutely necessary. If you have any questions, feel free to call our office.  YOUR PROVIDER WILL BE USING THE FOLLOWING PLATFORM TO COMPLETE YOUR VISIT:[email or web address]   . IF USING DOXIMITY or [email or web address] - The staff will give you instructions on receiving your link to join the meeting the day of your visit.   2-3 DAYS BEFORE YOUR APPOINTMENT  You will receive a telephone call from one of our [facility] team members - your caller ID may say "Unknown caller." If this is a video visit, we will walk you through how to get the video launched on your phone. We will remind you check your blood pressure, heart rate and weight prior to  your scheduled appointment. If you have an Apple Watch or Kardia, please upload any pertinent ECG strips the day before or morning of your appointment to [facility]. Our staff will also make sure you have reviewed the consent and agree to move forward with your scheduled tele-health visit.   THE DAY OF YOUR APPOINTMENT  Approximately 15 minutes prior to your scheduled appointment, you will receive a telephone call from one of [facility] team - your caller ID may say "Unknown caller."  Our staff will confirm medications, vital signs for the day and any symptoms you may be experiencing. Please have this information available prior to the time of visit start. It may also be helpful for you to have a pad of paper and pen handy for any instructions given during your visit. They will also walk you through joining the smartphone meeting if this is a video visit.    CONSENT FOR TELE-HEALTH VISIT - PLEASE REVIEW  I hereby voluntarily request, consent and authorize [facility] and its employed or contracted physicians, physician assistants, nurse practitioners or other licensed health care professionals (the Practitioner), to provide me with telemedicine health care services (the "Services") as deemed necessary by the treating Practitioner. I acknowledge and consent to receive the Services by the Practitioner via telemedicine. I understand that the telemedicine visit will involve communicating with the Practitioner through live audiovisual communication technology and the disclosure of certain medical information by electronic transmission. I  acknowledge that I have been given the opportunity to request an in-person assessment or other available alternative prior to the telemedicine visit and am voluntarily participating in the telemedicine visit.  I understand that I have the right to withhold or withdraw my consent to the use of telemedicine in the course of my care at any time, without affecting my right to  future care or treatment, and that the Practitioner or I may terminate the telemedicine visit at any time. I understand that I have the right to inspect all information obtained and/or recorded in the course of the telemedicine visit and may receive copies of available information for a reasonable fee.  I understand that some of the potential risks of receiving the Services via telemedicine include:  Marland Kitchen Delay or interruption in medical evaluation due to technological equipment failure or disruption; . Information transmitted may not be sufficient (e.g. poor resolution of images) to allow for appropriate medical decision making by the Practitioner; and/or  . In rare instances, security protocols could fail, causing a breach of personal health information.  Furthermore, I acknowledge that it is my responsibility to provide information about my medical history, conditions and care that is complete and accurate to the best of my ability. I acknowledge that Practitioner's advice, recommendations, and/or decision may be based on factors not within their control, such as incomplete or inaccurate data provided by me or distortions of diagnostic images or specimens that may result from electronic transmissions. I understand that the practice of medicine is not an exact science and that Practitioner makes no warranties or guarantees regarding treatment outcomes. I acknowledge that I will receive a copy of this consent concurrently upon execution via email to the email address I last provided but may also request a printed copy by calling the office of [facility].    I understand that my insurance will be billed for this visit.   I have read or had this consent read to me. . I understand the contents of this consent, which adequately explains the benefits and risks of the Services being provided via telemedicine.  . I have been provided ample opportunity to ask questions regarding this consent and the Services  and have had my questions answered to my satisfaction. . I give my informed consent for the services to be provided through the use of telemedicine in my medical care  By participating in this telemedicine visit I agree to the above.

## 2018-11-04 ENCOUNTER — Telehealth: Payer: Self-pay | Admitting: Cardiology

## 2018-11-04 NOTE — Telephone Encounter (Signed)
Mychart, smartphone, pre reg complete 11/04/18 AF °

## 2018-11-06 NOTE — Progress Notes (Signed)
Virtual Visit via Video Note   This visit type was conducted due to national recommendations for restrictions regarding the COVID-19 Pandemic (e.g. social distancing) in an effort to limit this patient's exposure and mitigate transmission in our community.  Due to his co-morbid illnesses, this patient is at least at moderate risk for complications without adequate follow up.  This format is felt to be most appropriate for this patient at this time.  All issues noted in this document were discussed and addressed.  A limited physical exam was performed with this format.  Please refer to the patient's chart for his consent to telehealth for [facility]   Date:  11/07/2018   ID:  [name], DOB [birth year], MRN [id number]  Patient Location: Home Provider Location: Home  PCP:  Antony Contras, MD  Cardiologist:  Minus Breeding, MD  Electrophysiologist:  None   Evaluation Performed:  New Patient Evaluation  Chief Complaint:  DOE  History of Present Illness:    Dillon Perkins is a 68 y.o. male who presents for follow up of atrial fib.  He was last seen in our office in April of 2017.  He was followed by Dr. Wynonia Lawman.  I was able to find an echo from 2018 with an EF of 50% and no significant valvular abnormalities.    The patient has had no past coronary artery disease.  He does report a perfusion study 3 or 4 years ago and he thinks this was negative for ischemia.  He said they tried to do an exercise tolerance test at that time but he could not do a POET (Plain Old Exercise Treadmill).  He is been in atrial fibrillation and he thinks it is been permanent.  I did see an EKG from 2017 that was atrial fibrillation.  He thinks the rate has been controlled.  He never really notices it.  However, he has been having some increasing shortness of breath with decreased exercise tolerance.  He will noticed this when he is climbing up an incline near his house.  He does not climb stairs a lot.  He  might notice it then.  He denies any chest pressure, neck or arm discomfort.  He does not feel his heart racing or skipping at those points.  He does not have any resting shortness of breath, PND or orthopnea.  He has had no weight gain or edema.  He thinks this has been slowly progressive.  The patient does not have symptoms concerning for COVID-19 infection (fever, chills, cough, or new shortness of breath).    Past Medical History:  Diagnosis Date  . Atrial fibrillation ([place])   . Chest pain   . Hyperlipidemia   . Hypertension    Past Surgical History:  Procedure Laterality Date  . APPENDECTOMY       Current Meds  Medication Sig  . amLODipine (NORVASC) 5 MG tablet Take 1 tablet by mouth daily.  Marland Kitchen apixaban (ELIQUIS) 5 MG TABS tablet Take 5 mg by mouth 2 (two) times daily.  Marland Kitchen losartan (COZAAR) 50 MG tablet Take 1 tablet (50 mg total) by mouth daily.  . metoprolol succinate (TOPROL-XL) 100 MG 24 hr tablet Take 1 tablet by mouth daily.  Marland Kitchen omeprazole (PRILOSEC) 20 MG capsule Take 1 capsule (20 mg total) by mouth daily.  . pravastatin (PRAVACHOL) 40 MG tablet Take 1 tablet by mouth daily.  . [DISCONTINUED] metoprolol succinate (TOPROL-XL) 50 MG 24 hr tablet TAKE 1 TABLET BY MOUTH DAILY  . [  DISCONTINUED] rivaroxaban (XARELTO) 20 MG TABS tablet Take 1 tablet (20 mg total) by mouth daily with supper.     Allergies:   Patient has no known allergies.   Social History   Tobacco Use  . Smoking status: Former Smoker    Packs/day: 1.00    Years: 0.50    Pack years: 0.50    Types: Cigarettes    Last attempt to quit: 08/21/1972    Years since quitting: 46.2  . Smokeless tobacco: Never Used  Substance Use Topics  . Alcohol use: Yes    Alcohol/week: 2.0 standard drinks    Types: 2 Cans of beer per week  . Drug use: No     Family Hx: The patient's family history includes Breast cancer in his mother; Heart disease in his father and sister.  ROS:   Please see the history of present  illness.    As stated in the HPI and negative for all other systems.   Prior CV studies:   The following studies were reviewed today:  NA  Labs/Other Tests and Data Reviewed:    EKG:  Atrial fib, rate 92, axis within normal limits, intervals within normal limits, nonspecific T wave changes, poor 19 2017  Recent Labs: No results found for requested labs within last 8760 hours.   Recent Lipid Panel Lab Results  Component Value Date/Time   CHOL 133 07/23/2014 05:05 AM   TRIG 122 07/23/2014 05:05 AM   HDL 35 (L) 07/23/2014 05:05 AM   CHOLHDL 3.8 07/23/2014 05:05 AM   LDLCALC 74 07/23/2014 05:05 AM    Wt Readings from Last 3 Encounters:  11/07/18 227 lb 6.4 oz (103.1 kg)  12/27/15 232 lb 12.8 oz (105.6 kg)  10/23/15 229 lb 1.9 oz (103.9 kg)     Objective:    Vital Signs:  BP (!) 147/98   Pulse 87   Ht 5\' 8"  (1.727 m)   Wt 227 lb 6.4 oz (103.1 kg)   BMI 34.58 kg/m    VITAL SIGNS:  reviewed GEN:  no acute distress RESPIRATORY:  normal respiratory effort, symmetric expansion NEURO:  alert and oriented x 3, no obvious focal deficit PSYCH:  normal affect  ASSESSMENT & PLAN:    Atrial fibrillation It looks like he is in permanent atrial fibrillation.  He tolerates anticoagulation.  At this point I am not considering any change in therapy .  I will be assessing his heart rate with activity as below.  Mildly reduced EF I discussed this with him and he was not aware that his EF had previously been reported at 45 to 50%.  We are going to start with an echocardiogram and a BNP level.  I will see him back after this to further assess.   Following this I will consider ordering a POET (Plain Old Exercise Treadmill) to judge heart rate control and to exclude ischemia.  I think there is a low pretest probability of this.  Essential hypertension Blood pressures is slightly elevated and he will need to keep a blood pressure diary.   COVID-19 Education: The signs and symptoms of  COVID-19 were discussed with the patient and how to seek care for testing (follow up with PCP or arrange E-visit).   The importance of social distancing was discussed today.  Time:   Today, I have spent   minutes with the patient with telehealth technology discussing the above problems.     Medication Adjustments/Labs and Tests Ordered: Current medicines are reviewed at  length with the patient today.  Concerns regarding medicines are outlined above.   Tests Ordered: No orders of the defined types were placed in this encounter.   Medication Changes: No orders of the defined types were placed in this encounter.   Disposition:  Follow up with me after the echo.  Signed, Minus Breeding, MD  11/07/2018 1:25 PM    [facility]

## 2018-11-07 ENCOUNTER — Telehealth (INDEPENDENT_AMBULATORY_CARE_PROVIDER_SITE_OTHER): Payer: Medicare Other | Admitting: Cardiology

## 2018-11-07 ENCOUNTER — Telehealth: Payer: Self-pay | Admitting: *Deleted

## 2018-11-07 ENCOUNTER — Encounter: Payer: Self-pay | Admitting: Cardiology

## 2018-11-07 VITALS — BP 147/98 | HR 87 | Ht 68.0 in | Wt 227.4 lb

## 2018-11-07 DIAGNOSIS — I4821 Permanent atrial fibrillation: Secondary | ICD-10-CM

## 2018-11-07 DIAGNOSIS — I420 Dilated cardiomyopathy: Secondary | ICD-10-CM | POA: Insufficient documentation

## 2018-11-07 DIAGNOSIS — R0602 Shortness of breath: Secondary | ICD-10-CM

## 2018-11-07 DIAGNOSIS — Z7189 Other specified counseling: Secondary | ICD-10-CM | POA: Insufficient documentation

## 2018-11-07 NOTE — Telephone Encounter (Signed)
Left message for patient to call and schedule 6 mos telehealth visit with Dr. Crenshaw 

## 2018-11-08 NOTE — Telephone Encounter (Signed)
Left message for patient to call regarding Echo and f/u appointment with Dr. Percival Spanish

## 2018-11-10 ENCOUNTER — Other Ambulatory Visit: Payer: Self-pay

## 2018-11-10 ENCOUNTER — Ambulatory Visit (HOSPITAL_COMMUNITY): Payer: Medicare Other | Attending: Cardiovascular Disease

## 2018-11-10 DIAGNOSIS — R0602 Shortness of breath: Secondary | ICD-10-CM | POA: Diagnosis not present

## 2018-11-15 ENCOUNTER — Telehealth: Payer: Self-pay | Admitting: Cardiology

## 2018-11-15 NOTE — Telephone Encounter (Signed)
LVM for pt to call with video or phone visit information.

## 2018-11-22 DIAGNOSIS — R0602 Shortness of breath: Secondary | ICD-10-CM | POA: Diagnosis not present

## 2018-11-23 ENCOUNTER — Telehealth: Payer: Self-pay | Admitting: Cardiology

## 2018-11-23 LAB — BRAIN NATRIURETIC PEPTIDE: BNP: 284.3 pg/mL — ABNORMAL HIGH (ref 0.0–100.0)

## 2018-11-23 NOTE — Telephone Encounter (Signed)
smartphone/ consent/ my chart active/ pre reg completed °

## 2018-11-24 NOTE — Progress Notes (Signed)
Virtual Visit via Video Note   This visit type was conducted due to national recommendations for restrictions regarding the COVID-19 Pandemic (e.g. social distancing) in an effort to limit this patient's exposure and mitigate transmission in our community.  Due to his co-morbid illnesses, this patient is at least at moderate risk for complications without adequate follow up.  This format is felt to be most appropriate for this patient at this time.  All issues noted in this document were discussed and addressed.  A limited physical exam was performed with this format.  Please refer to the patient's chart for his consent to telehealth for [facility].   Date:  11/25/2018   ID:  Dillon Perkins, DOB 1951/05/11, MRN [id number]  Patient Location: Home Provider Location: Home  PCP:  Antony Contras, MD  Cardiologist:  Minus Breeding, MD  Electrophysiologist:  None   Evaluation Performed:  Follow-Up Visit  Chief Complaint:  Atrial fib  History of Present Illness:    Dillon Perkins is a 68 y.o. male who presents for evaluation of atrial fib. He has had a mildly reduced EF.  This is my second telehealth visit with him.  At the last visit I ordered an echo which was essentially normal.  Today was these results.  He actually feels well.  He denies any palpitations, presyncope or syncope.  He has had no chest pressure, neck or arm discomfort.  He has had no weight gain or edema.  Of note he did have a slightly elevated BNP.    The patient does not have symptoms concerning for COVID-19 infection (fever, chills, cough, or new shortness of breath).    Past Medical History:  Diagnosis Date  . Atrial fibrillation ([place])   . Hyperlipidemia   . Hypertension    Past Surgical History:  Procedure Laterality Date  . APPENDECTOMY       Prior to Admission medications   Medication Sig Start Date End Date Taking? Authorizing Provider  amLODipine (NORVASC) 5 MG tablet Take 1 tablet by mouth  daily. 10/14/18  Yes [provider]  apixaban (ELIQUIS) 5 MG TABS tablet Take 5 mg by mouth 2 (two) times daily.   Yes [provider]  Coenzyme Q10 (COQ10) 100 MG CAPS Take by mouth daily.   Yes [provider]  metoprolol succinate (TOPROL-XL) 100 MG 24 hr tablet Take 1 tablet by mouth daily. 08/08/18  Yes [provider]  pravastatin (PRAVACHOL) 40 MG tablet Take 1 tablet by mouth daily. 09/26/18  Yes [provider]     Allergies:   Patient has no known allergies.   Social History   Tobacco Use  . Smoking status: Former Smoker    Packs/day: 1.00    Years: 0.50    Pack years: 0.50    Types: Cigarettes    Last attempt to quit: 08/21/1972    Years since quitting: 46.2  . Smokeless tobacco: Never Used  Substance Use Topics  . Alcohol use: Yes    Alcohol/week: 2.0 standard drinks    Types: 2 Cans of beer per week  . Drug use: No     Family Hx: The patient's family history includes Breast cancer in his mother; Heart disease (age of onset: 79) in his father; Heart disease (age of onset: 35) in his sister.  ROS:   Please see the history of present illness.    As stated in the HPI and negative for all other systems.   Prior CV studies:  The following studies were reviewed today:  Echo  Labs/Other Tests and Data Reviewed:    EKG:  No ECG reviewed.  Recent Labs: 11/22/2018: BNP 284.3   Recent Lipid Panel Lab Results  Component Value Date/Time   CHOL 133 07/23/2014 05:05 AM   TRIG 122 07/23/2014 05:05 AM   HDL 35 (L) 07/23/2014 05:05 AM   CHOLHDL 3.8 07/23/2014 05:05 AM   LDLCALC 74 07/23/2014 05:05 AM    Wt Readings from Last 3 Encounters:  11/25/18 220 lb (99.8 kg)  11/07/18 227 lb 6.4 oz (103.1 kg)  12/27/15 232 lb 12.8 oz (105.6 kg)     Objective:    Vital Signs:  BP 140/90   Pulse 90   Ht 5\' 7"  (1.702 m)   Wt 220 lb (99.8 kg)   BMI 34.46 kg/m    VITAL SIGNS:  reviewed  ASSESSMENT & PLAN:    Atrial  fibrillation Today web talked about rate control and anticoagulation.  He is going to look into getting a device to follow-up his heart rate.  Otherwise he will continue the meds as listed.   Essential hypertension Blood pressures is at the upper limits of normal.  I told him about acceptable limits and I think he really needs to work on this with weight loss and increase physical activity.   Elevated BNP In the absence of symptoms I think he needs good blood pressure control and salt restriction.  I do not think that further testing is indicated.  He has normal ejection fraction as listed.  Time:   Today, I have spent 12 minutes with the patient with telehealth technology discussing the above problems.     Medication Adjustments/Labs and Tests Ordered: Current medicines are reviewed at length with the patient today.  Concerns regarding medicines are outlined above.   Tests Ordered: No orders of the defined types were placed in this encounter.   Medication Changes: No orders of the defined types were placed in this encounter.   Disposition:  Follow up about 18 months or so.  Signed, Minus Breeding, MD  11/25/2018 12:42 PM    [facility]

## 2018-11-25 ENCOUNTER — Encounter: Payer: Self-pay | Admitting: Cardiology

## 2018-11-25 ENCOUNTER — Telehealth (INDEPENDENT_AMBULATORY_CARE_PROVIDER_SITE_OTHER): Payer: Medicare Other | Admitting: Cardiology

## 2018-11-25 VITALS — BP 140/90 | HR 90 | Ht 67.0 in | Wt 220.0 lb

## 2018-11-25 DIAGNOSIS — I4821 Permanent atrial fibrillation: Secondary | ICD-10-CM

## 2018-11-25 DIAGNOSIS — I10 Essential (primary) hypertension: Secondary | ICD-10-CM

## 2018-11-25 NOTE — Patient Instructions (Signed)
Medication Instructions:  Continue current medications  If you need a refill on your cardiac medications before your next appointment, please call your pharmacy.  Labwork: None Ordered   Testing/Procedures: None Ordered  Follow-Up: You will need a follow up appointment in 18 months.  Please call our office 2 months in advance to schedule this appointment.  You may see Minus Breeding, MD or one of the following Advanced Practice Providers on your designated Care Team:   Rosaria Ferries, PA-C . Jory Sims, DNP, ANP    At [facility], you and your health needs are our priority.  As part of our continuing mission to provide you with exceptional heart care, we have created designated Provider Care Teams.  These Care Teams include your primary Cardiologist (physician) and Advanced Practice Providers (APPs -  Physician Assistants and Nurse Practitioners) who all work together to provide you with the care you need, when you need it.   Thank you for choosing [facility] at [facility]!!

## 2019-02-06 ENCOUNTER — Other Ambulatory Visit: Payer: Self-pay

## 2019-02-08 MED ORDER — METOPROLOL SUCCINATE ER 100 MG PO TB24: 100.0000 mg | ORAL_TABLET | Freq: Every day | ORAL | 4 refills | Status: DC

## 2019-04-07 ENCOUNTER — Telehealth: Payer: Self-pay

## 2019-04-07 NOTE — Telephone Encounter (Signed)
Request for surgical clearance:  1. What type of surgery is being performed? colonoscopy/endoscopy  2. When is this surgery scheduled? 05-09-2019   3. What type of clearance is required (medical clearance vs. Pharmacy clearance to hold med vs. Both)? PHARMACY  4. Are there any medications that need to be held prior to surgery and how long?  ELIQUIS-2 DAYS PRIOR   5. Practice name and name of physician performing surgery?  [facility]  DR BRAHMBHATT  6. What is your office phone number [phone]    7.   What is your office fax number [phone]  8.   Anesthesia type (None, local, MAC, general) ?  PROPOFOL

## 2019-04-10 NOTE — Telephone Encounter (Signed)
Patient with diagnosis of afib on Eliquis for anticoagulation.    Procedure: colonoscopy/endoscopy Date of procedure: 05/09/2019  CHADS2-VASc score of  3 (CHF, HTN, AGE)  CrCl 57  Per office protocol, patient can hold Eliquis for 2 days prior to procedure.

## 2019-04-10 NOTE — Telephone Encounter (Signed)
   Primary Cardiologist: Minus Breeding, MD  Chart reviewed as part of pre-operative protocol coverage. Given past medical history and time since last visit, based on ACC/AHA guidelines, Dillon Perkins would be at acceptable risk for the planned procedure without further cardiovascular testing.   Patient with diagnosis of afib on Eliquis for anticoagulation with a CHADS2-VASc score of  3 (CHF, HTN, AGE) and CrCl 57  Per office protocol, patient can hold Eliquis for 2 days prior to procedure.    I will route this recommendation to the requesting party via Epic fax function and remove from pre-op pool.  Please call with questions.  Kathyrn Drown, NP 04/10/2019, 10:49 AM

## 2019-04-17 DIAGNOSIS — Z23 Encounter for immunization: Secondary | ICD-10-CM | POA: Diagnosis not present

## 2019-05-16 DIAGNOSIS — N183 Chronic kidney disease, stage 3 unspecified: Secondary | ICD-10-CM | POA: Diagnosis not present

## 2019-05-16 DIAGNOSIS — I4891 Unspecified atrial fibrillation: Secondary | ICD-10-CM | POA: Diagnosis not present

## 2019-05-16 DIAGNOSIS — E7800 Pure hypercholesterolemia, unspecified: Secondary | ICD-10-CM | POA: Diagnosis not present

## 2019-05-16 DIAGNOSIS — I10 Essential (primary) hypertension: Secondary | ICD-10-CM | POA: Diagnosis not present

## 2019-07-13 ENCOUNTER — Ambulatory Visit: Payer: Medicare Other | Attending: Internal Medicine

## 2019-07-13 DIAGNOSIS — Z20822 Contact with and (suspected) exposure to covid-19: Secondary | ICD-10-CM

## 2019-07-15 LAB — NOVEL CORONAVIRUS, NAA: SARS-CoV-2, NAA: NOT DETECTED

## 2019-08-07 DIAGNOSIS — N401 Enlarged prostate with lower urinary tract symptoms: Secondary | ICD-10-CM | POA: Diagnosis not present

## 2019-08-07 DIAGNOSIS — R351 Nocturia: Secondary | ICD-10-CM | POA: Diagnosis not present

## 2019-08-21 DIAGNOSIS — I4891 Unspecified atrial fibrillation: Secondary | ICD-10-CM | POA: Diagnosis not present

## 2019-08-21 DIAGNOSIS — I10 Essential (primary) hypertension: Secondary | ICD-10-CM | POA: Diagnosis not present

## 2019-08-21 DIAGNOSIS — E7800 Pure hypercholesterolemia, unspecified: Secondary | ICD-10-CM | POA: Diagnosis not present

## 2019-09-21 ENCOUNTER — Ambulatory Visit: Payer: Medicare Other | Attending: Internal Medicine

## 2019-09-21 DIAGNOSIS — Z23 Encounter for immunization: Secondary | ICD-10-CM

## 2019-09-21 NOTE — Progress Notes (Signed)
   Covid-19 Vaccination Clinic  Name:  RALF KONOPKA    MRN: [id number] DOB: 01/23/1951  09/21/2019  Mr. Fraticelli was observed post Covid-19 immunization for 15 minutes without incident. He was provided with Vaccine Information Sheet and instruction to access the V-Safe system.   Mr. Koller was instructed to call 911 with any severe reactions post vaccine: Marland Kitchen Difficulty breathing  . Swelling of face and throat  . A fast heartbeat  . A bad rash all over body  . Dizziness and weakness   Immunizations Administered    Name Date Dose VIS Date Route   Pfizer COVID-19 Vaccine 09/21/2019  8:13 AM 0.3 mL 06/16/2019 Intramuscular   Manufacturer: [place]   Lot: [id number]   [place]: [address]-3

## 2019-10-16 ENCOUNTER — Ambulatory Visit: Payer: Medicare Other | Attending: Internal Medicine

## 2019-10-16 DIAGNOSIS — Z23 Encounter for immunization: Secondary | ICD-10-CM

## 2019-10-16 NOTE — Progress Notes (Signed)
   Covid-19 Vaccination Clinic  Name:  Dillon Perkins    MRN: [id number] DOB: 09-11-1950  10/16/2019  Dillon Perkins was observed post Covid-19 immunization for 15 minutes without incident. He was provided with Vaccine Information Sheet and instruction to access the V-Safe system.   Dillon Perkins was instructed to call 911 with any severe reactions post vaccine: Marland Kitchen Difficulty breathing  . Swelling of face and throat  . A fast heartbeat  . A bad rash all over body  . Dizziness and weakness   Immunizations Administered    Name Date Dose VIS Date Route   Pfizer COVID-19 Vaccine 10/16/2019  8:05 AM 0.3 mL 06/16/2019 Intramuscular   Manufacturer: [place]   Lot: [id number]   [place]: [address]-9

## 2019-12-01 DIAGNOSIS — L723 Sebaceous cyst: Secondary | ICD-10-CM | POA: Diagnosis not present

## 2019-12-01 DIAGNOSIS — L0291 Cutaneous abscess, unspecified: Secondary | ICD-10-CM | POA: Diagnosis not present

## 2019-12-18 DIAGNOSIS — R3912 Poor urinary stream: Secondary | ICD-10-CM | POA: Diagnosis not present

## 2019-12-18 DIAGNOSIS — N401 Enlarged prostate with lower urinary tract symptoms: Secondary | ICD-10-CM | POA: Diagnosis not present

## 2019-12-18 DIAGNOSIS — R311 Benign essential microscopic hematuria: Secondary | ICD-10-CM | POA: Diagnosis not present

## 2019-12-18 DIAGNOSIS — R8279 Other abnormal findings on microbiological examination of urine: Secondary | ICD-10-CM | POA: Diagnosis not present

## 2020-01-02 DIAGNOSIS — C220 Liver cell carcinoma: Secondary | ICD-10-CM | POA: Diagnosis not present

## 2020-01-02 DIAGNOSIS — K766 Portal hypertension: Secondary | ICD-10-CM | POA: Diagnosis not present

## 2020-01-02 DIAGNOSIS — K7460 Unspecified cirrhosis of liver: Secondary | ICD-10-CM | POA: Diagnosis not present

## 2020-01-02 DIAGNOSIS — K5730 Diverticulosis of large intestine without perforation or abscess without bleeding: Secondary | ICD-10-CM | POA: Diagnosis not present

## 2020-01-10 ENCOUNTER — Encounter: Payer: Self-pay | Admitting: Nurse Practitioner

## 2020-01-10 ENCOUNTER — Telehealth: Payer: Self-pay | Admitting: Oncology

## 2020-01-10 NOTE — Telephone Encounter (Signed)
Received a new pt referral from Dr. Lovena Neighbours at [facility] to assess the liver lesions noted on recent CT. [name] has been scheduled to see Dr. Benay Spice on 7/15 at 2pm. I cld and lft the appt date and time on the pt's voicemail.

## 2020-01-12 NOTE — Progress Notes (Signed)
Spoke with patient introduced myself and explained my role as Art therapist.  He is aware of his upcoming appointment on Thursday 7/15 at 2 pm to arrive by 1:45 for registration with Dr. Benay Spice.  He understands his wife may come with him to his appointment.  He had no further questions at this time.

## 2020-01-18 ENCOUNTER — Inpatient Hospital Stay: Payer: Medicare Other | Attending: Oncology | Admitting: Oncology

## 2020-01-18 ENCOUNTER — Other Ambulatory Visit: Payer: Self-pay

## 2020-01-18 ENCOUNTER — Other Ambulatory Visit: Payer: Self-pay | Admitting: Oncology

## 2020-01-18 ENCOUNTER — Inpatient Hospital Stay: Payer: Medicare Other

## 2020-01-18 VITALS — BP 106/77 | HR 85 | Temp 97.5°F | Resp 17 | Ht 67.0 in | Wt 220.5 lb

## 2020-01-18 DIAGNOSIS — C220 Liver cell carcinoma: Secondary | ICD-10-CM

## 2020-01-18 DIAGNOSIS — I4891 Unspecified atrial fibrillation: Secondary | ICD-10-CM | POA: Insufficient documentation

## 2020-01-18 DIAGNOSIS — Z7901 Long term (current) use of anticoagulants: Secondary | ICD-10-CM

## 2020-01-18 DIAGNOSIS — N400 Enlarged prostate without lower urinary tract symptoms: Secondary | ICD-10-CM

## 2020-01-18 DIAGNOSIS — K766 Portal hypertension: Secondary | ICD-10-CM

## 2020-01-18 DIAGNOSIS — K7460 Unspecified cirrhosis of liver: Secondary | ICD-10-CM

## 2020-01-18 DIAGNOSIS — I8510 Secondary esophageal varices without bleeding: Secondary | ICD-10-CM | POA: Insufficient documentation

## 2020-01-18 LAB — CBC WITH DIFFERENTIAL (CANCER CENTER ONLY)
Abs Immature Granulocytes: 0.02 10*3/uL (ref 0.00–0.07)
Basophils Absolute: 0.1 10*3/uL (ref 0.0–0.1)
Basophils Relative: 1 %
Eosinophils Absolute: 0.3 10*3/uL (ref 0.0–0.5)
Eosinophils Relative: 5 %
HCT: 37.7 % — ABNORMAL LOW (ref 39.0–52.0)
Hemoglobin: 12.6 g/dL — ABNORMAL LOW (ref 13.0–17.0)
Immature Granulocytes: 0 %
Lymphocytes Relative: 22 %
Lymphs Abs: 1.2 10*3/uL (ref 0.7–4.0)
MCH: 35.8 pg — ABNORMAL HIGH (ref 26.0–34.0)
MCHC: 33.4 g/dL (ref 30.0–36.0)
MCV: 107.1 fL — ABNORMAL HIGH (ref 80.0–100.0)
Monocytes Absolute: 0.6 10*3/uL (ref 0.1–1.0)
Monocytes Relative: 11 %
Neutro Abs: 3.3 10*3/uL (ref 1.7–7.7)
Neutrophils Relative %: 61 %
Platelet Count: 104 10*3/uL — ABNORMAL LOW (ref 150–400)
RBC: 3.52 MIL/uL — ABNORMAL LOW (ref 4.22–5.81)
RDW: 14.2 % (ref 11.5–15.5)
WBC Count: 5.5 10*3/uL (ref 4.0–10.5)
nRBC: 0 % (ref 0.0–0.2)

## 2020-01-18 LAB — CMP (CANCER CENTER ONLY)
ALT: 41 U/L (ref 0–44)
AST: 65 U/L — ABNORMAL HIGH (ref 15–41)
Albumin: 3.1 g/dL — ABNORMAL LOW (ref 3.5–5.0)
Alkaline Phosphatase: 152 U/L — ABNORMAL HIGH (ref 38–126)
Anion gap: 9 (ref 5–15)
BUN: 32 mg/dL — ABNORMAL HIGH (ref 8–23)
CO2: 23 mmol/L (ref 22–32)
Calcium: 9.1 mg/dL (ref 8.9–10.3)
Chloride: 109 mmol/L (ref 98–111)
Creatinine: 1.61 mg/dL — ABNORMAL HIGH (ref 0.61–1.24)
GFR, Est AFR Am: 49 mL/min — ABNORMAL LOW (ref >60)
GFR, Estimated: 43 mL/min — ABNORMAL LOW (ref >60)
Glucose, Bld: 83 mg/dL (ref 70–99)
Potassium: 4.1 mmol/L (ref 3.5–5.1)
Sodium: 141 mmol/L (ref 135–145)
Total Bilirubin: 2 mg/dL — ABNORMAL HIGH (ref 0.3–1.2)
Total Protein: 7.7 g/dL (ref 6.5–8.1)

## 2020-01-18 LAB — HEPATITIS B CORE ANTIBODY, TOTAL: Hep B Core Total Ab: NONREACTIVE

## 2020-01-18 LAB — HEPATITIS C ANTIBODY: HCV Ab: NONREACTIVE

## 2020-01-18 LAB — HEPATITIS B SURFACE ANTIGEN: Hepatitis B Surface Ag: NONREACTIVE

## 2020-01-18 NOTE — Progress Notes (Signed)
Met with patient and his wife Dillon Perkins today at their initial medical oncology appointment with Dr. Benay Spice.  I explained my role as nurse navigator and they were given my direct number for any questions or concerns that arise.  They verbalized an understanding of the plan to discuss his case at our [facility] on 7/21 and he will come in for follow up at 8:30 that today to discuss the recommendations.  He is having blood work drawn today.  I escorted him and his wife to the lobby to wait for these to be done.   His wife also asked about a referral to our nutritionist.  I have made the referral.

## 2020-01-18 NOTE — Progress Notes (Signed)
[facility] New Patient Consult   Requesting MD: Antony Contras, [address],  [place] [address]   [name] 69 y.o.  13-Aug-1950    Reason for Consult: Hepatocellular carcinoma   HPI: [name] was referred to Dr. Gilford Rile for evaluation of nocturia. He reports tamsulosin helped the nocturia. He was noted to have microscopic hematuria on a urinalysis 12/18/2019. He was referred for a CT of the abdomen and pelvis 01/02/2020.  There were changes of cirrhosis with varices.  Multiple Li- RADS 5 liver lesions were noted including a 7.1 x 5.7 cm lesion in segment 2.  There is a 1.1 cm fluid density lesion in the pancreas head consistent with a postinflammatory lesion or small intraductal papillary mucinous neoplasm.  The spleen is enlarged.  There are right and left renal calculi.  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  Small amount of right perihepatic ascites.  [name] reports feeling well.  He does not have a known history of cirrhosis.  Past Medical History:  Diagnosis Date  . Atrial fibrillation ([place])   . Hyperlipidemia   . Hypertension     .  BPH symptoms  Past Surgical History:  Procedure Laterality Date  . APPENDECTOMY   childhood    . Vasectomy                                                                                                                          2011  Medications: Reviewed  Allergies: No Known Allergies  Family history: His brother died of breast cancer  Social History:   He lives with his wife in [place].  He is retired from Science [employer].  He currently works Psychologist, [employer].  He quit smoking cigarettes 34 years ago.  He smoked 1 pack a day for 2 years.  He reports heavy alcohol use and quit 4 days ago.  He has 3 large liquor drinks each night.  No risk factor for HIV or hepatitis.  No transfusion history.  ROS:   Positives include: Easy bleeding with trauma, nocturia  and decreased urinary stream, exertional dyspnea when walking up hills, recent spooning of the second fingernail bilaterally  A complete ROS was otherwise negative.  Physical Exam:  Blood pressure 106/77, pulse 85, temperature (!) 97.5 F (36.4 C), temperature source Temporal, resp. rate 17, height 5\' 7"  (1.702 m), weight 220 lb 8 oz (100 kg), SpO2 98 %.  HEENT: Neck without mass Lungs: Clear bilaterally Cardiac: Regular rate and rhythm Abdomen: No hepatosplenomegaly, no mass, nontender, no apparent ascites GU: Testes without mass Vascular: No leg edema Lymph nodes: No cervical, supraclavicular, axillary, or inguinal nodes Neurologic: Alert and oriented, the motor exam appears intact in the upper and lower extremities bilaterally Skin: No rash.  No telangiectasias at the upper chest or back.  Spooning of the second fingernail bilaterally Musculoskeletal: No spine tenderness   LAB:  CBC  Lab Results  Component Value Date   WBC 8.8 07/23/2014   HGB 13.8 07/23/2014   HCT 41.2 07/23/2014   MCV 102.7 (H) 07/23/2014   PLT 150 07/23/2014   NEUTROABS 6.0 07/22/2014        CMP  Lab Results  Component Value Date   NA 140 10/23/2015   K 4.2 10/23/2015   CL 103 10/23/2015   CO2 27 10/23/2015   GLUCOSE 73 10/23/2015   BUN 25 10/23/2015   CREATININE 1.43 (H) 10/23/2015   CALCIUM 8.7 10/23/2015   PROT 6.8 07/22/2014   ALBUMIN 3.6 07/22/2014   AST 49 (H) 07/22/2014   ALT 49 07/22/2014   ALKPHOS 57 07/22/2014   BILITOT 1.0 07/22/2014   GFRNONAA 38 (L) 07/23/2014   GFRAA 44 (L) 07/23/2014     No results found for: CEA1  Imaging: As per HPI, CT images from 01/02/2020 reviewed with [name] and his wife   Assessment/Plan:   1. Hepatocellular carcinoma  Multiple Li-Rad 5 liver lesions noted on a CT abdomen/pelvis 01/02/2020 including a 7.1 x 5.7 cm segment 2 lesion, there are right and left hepatic lesions  CT abdomen/pelvis 01/02/2020-cirrhosis with varices, multiple  LR 5 liver lesions, 1.1 cm fluid density lesion in the pancreas head, splenomegaly, bilateral kidney stones, small amount of right perihepatic ascites 2. 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 Hypertension 4. BPH 5. Hyperlipidemia 6. History of heavy alcohol use 7. Remote history of tobacco use 8. Renal insufficiency 9. Microscopic hematuria   Disposition:   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  I discussed the diagnosis, prognosis, and treatment options with him.  He does not have a known history of cirrhosis.  The cirrhosis is most likely related to alcohol use.  We will obtain hepatitis serologies and a ferritin level today.  We also checked a CBC, chemistry panel, and alpha-fetoprotein.  He does not appear to be a transplant or surgical candidate.  We discussed other treatment options including chemoembolization and systemic therapy.  His case will be presented at the GI tumor conference on 01/24/2020.  He will return for an office visit the same day.  I will make referral to interventional radiology.  He is scheduled to see gastroenterology in early August for management of cirrhosis.  Betsy Coder, MD  01/18/2020, 2:31 PM

## 2020-01-19 ENCOUNTER — Telehealth: Payer: Self-pay | Admitting: Oncology

## 2020-01-19 LAB — AFP TUMOR MARKER: AFP, Serum, Tumor Marker: 12.4 ng/mL — ABNORMAL HIGH (ref 0.0–8.3)

## 2020-01-19 LAB — FERRITIN: Ferritin: 2019 ng/mL — ABNORMAL HIGH (ref 24–336)

## 2020-01-19 NOTE — Telephone Encounter (Signed)
Scheduled appt per 7/15 sch msg - no answer . Left message for patient with appt date and time

## 2020-01-24 ENCOUNTER — Telehealth: Payer: Self-pay | Admitting: Oncology

## 2020-01-24 ENCOUNTER — Other Ambulatory Visit: Payer: Self-pay

## 2020-01-24 ENCOUNTER — Telehealth: Payer: Self-pay | Admitting: *Deleted

## 2020-01-24 ENCOUNTER — Inpatient Hospital Stay (HOSPITAL_BASED_OUTPATIENT_CLINIC_OR_DEPARTMENT_OTHER): Payer: Medicare Other | Admitting: Oncology

## 2020-01-24 ENCOUNTER — Other Ambulatory Visit: Payer: Self-pay | Admitting: Oncology

## 2020-01-24 VITALS — BP 107/77 | HR 84 | Temp 97.6°F | Resp 17 | Ht 67.0 in | Wt 218.1 lb

## 2020-01-24 DIAGNOSIS — Z7901 Long term (current) use of anticoagulants: Secondary | ICD-10-CM | POA: Diagnosis not present

## 2020-01-24 DIAGNOSIS — I4891 Unspecified atrial fibrillation: Secondary | ICD-10-CM | POA: Diagnosis not present

## 2020-01-24 DIAGNOSIS — K766 Portal hypertension: Secondary | ICD-10-CM | POA: Diagnosis not present

## 2020-01-24 DIAGNOSIS — C220 Liver cell carcinoma: Secondary | ICD-10-CM

## 2020-01-24 DIAGNOSIS — K7460 Unspecified cirrhosis of liver: Secondary | ICD-10-CM | POA: Diagnosis not present

## 2020-01-24 DIAGNOSIS — K769 Liver disease, unspecified: Secondary | ICD-10-CM

## 2020-01-24 NOTE — Telephone Encounter (Signed)
Informed patient that MD was not able to add hemochromatosis DNA test to his labs collected last week. Will draw on 02/14/20 at 0830 if he agrees. Patient agrees to this. Explained to him the reason for testing.

## 2020-01-24 NOTE — Progress Notes (Signed)
  [facility] OFFICE PROGRESS NOTE   Diagnosis: Hepatocellular carcinoma  INTERVAL HISTORY:   Dillon Perkins returns as scheduled.  He feels well.  No complaint.  Objective:  Vital signs in last 24 hours:  Blood pressure 107/77, pulse 84, temperature 97.6 F (36.4 C), temperature source Temporal, resp. rate 17, height 5\' 7"  (1.702 m), weight 218 lb 1.6 oz (98.9 kg), SpO2 99 %.   Physical examination not performed today Lab Results:  Lab Results  Component Value Date   WBC 5.5 01/18/2020   HGB 12.6 (L) 01/18/2020   HCT 37.7 (L) 01/18/2020   MCV 107.1 (H) 01/18/2020   PLT 104 (L) 01/18/2020   NEUTROABS 3.3 01/18/2020    CMP  Lab Results  Component Value Date   NA 141 01/18/2020   K 4.1 01/18/2020   CL 109 01/18/2020   CO2 23 01/18/2020   GLUCOSE 83 01/18/2020   BUN 32 (H) 01/18/2020   CREATININE 1.61 (H) 01/18/2020   CALCIUM 9.1 01/18/2020   PROT 7.7 01/18/2020   ALBUMIN 3.1 (L) 01/18/2020   AST 65 (H) 01/18/2020   ALT 41 01/18/2020   ALKPHOS 152 (H) 01/18/2020   BILITOT 2.0 (H) 01/18/2020   GFRNONAA 43 (L) 01/18/2020   GFRAA 49 (L) 01/18/2020   Ferritin 2019, AFP 12.4 Hepatitis C antibody negative, hepatitis B core antibody negative  Medications: I have reviewed the patient's current medications.   Assessment/Plan: 1. Hepatocellular carcinoma  Multiple Li-Rad 5 liver lesions noted on a CT abdomen/pelvis 01/02/2020 including a 7.1 x 5.7 cm segment 2 lesion, there are right and left hepatic lesions  CT abdomen/pelvis 01/02/2020-cirrhosis with varices, multiple LR 5 liver lesions, 1.1 cm fluid density lesion in the pancreas head, splenomegaly, bilateral kidney stones, small amount of right perihepatic ascites 2. 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 Hypertension 4. BPH 5. Hyperlipidemia 6. History of heavy alcohol use 7. Remote history of tobacco use 8. Renal  insufficiency 9. Microscopic hematuria     Disposition: Dillon Perkins has been diagnosed with multifocal hepatocellular carcinoma.  His case was presented at the GI tumor conference earlier today.  He is not a surgical or ablation candidate.  Treatment options include systemic therapy and embolization therapy.  He may be a candidate for a staged embolization procedure.  The bilirubin is mildly elevated.  He will be referred to interventional radiology to consider TACE and Y 90.  He is also a candidate for systemic therapy.  We discussed atezolizumab/bevacizumab therapy.  Dillon Perkins has been diagnosed with cirrhosis and portal hypertension.  He has an appointment with gastroenterology within the next 2 weeks.  He may need medical therapy for cirrhosis.  He is maintained on anticoagulation therapy for atrial fibrillation in the setting of varices.  He may need an upper endoscopy to assess the extent of the varices.  The cirrhosis is likely secondary to alcohol.  We will check a hemochromatosis gene study to rule out hereditary hemochromatosis.  He will be referred for a liver MRI and return for an office visit in approximately 2 weeks.  Betsy Coder, MD  01/24/2020  8:58 AM

## 2020-01-24 NOTE — Telephone Encounter (Signed)
Scheduled per 7/21 los. Printed avs and calendar for pt 

## 2020-01-29 ENCOUNTER — Other Ambulatory Visit: Payer: Self-pay

## 2020-01-29 ENCOUNTER — Inpatient Hospital Stay: Payer: Medicare Other | Admitting: Nutrition

## 2020-01-29 NOTE — Progress Notes (Signed)
69 year old male diagnosed with hepatocellular cancer.  He is followed by Dr. Benay Spice.  Past medical history includes atrial fibrillation, hyperlipidemia, hypertension, BPH, tobacco and alcohol usage, renal insufficiency.  Medications include coenzyme Q 10.  Labs include BUN 32, creatinine 1.61, and albumin 3.1 on July 15.  Height: 5 feet 7 inches. Weight: 220.5 pounds. Usual body weight: 227 pounds May 2021. BMI: 34.54.  Patient is interested in healthy diet to increase his immune function secondary to liver cancer.  Nutrition diagnosis: Food and nutrition related knowledge deficit related to hepatocellular cancer and associated treatments as evidenced by no prior need for nutrition related information.  Intervention: Patient educated on a healthy plant-based diet with lean protein's, complex carbohydrates, and increased fruits and vegetables. Encouraged adequate fluid intake with primarily water. Brief discussion on importance of obtaining nutrients through food intake. Patient advised to speak to MD regarding nutrition supplements such as milk thistle. Provided evidence-based resources. Provided patient and wife with fact sheets.  Questions were answered.  Teach back method used.  Monitoring, evaluation, goals: Patient will tolerate adequate calories and protein for maintenance of lean body mass.  Next visit: Patient will contact me for questions or concerns.  Nutrition diagnosis resolved.  **Disclaimer: This note was dictated with voice recognition software. Similar sounding words can inadvertently be transcribed and this note may contain transcription errors which may not have been corrected upon publication of note.**

## 2020-01-30 ENCOUNTER — Ambulatory Visit
Admission: RE | Admit: 2020-01-30 | Discharge: 2020-01-30 | Disposition: A | Discharge status: Home / Self Care | Payer: Medicare Other | Source: Ambulatory Visit | Attending: Oncology | Admitting: Oncology

## 2020-01-30 ENCOUNTER — Encounter: Payer: Self-pay | Admitting: *Deleted

## 2020-01-30 DIAGNOSIS — C220 Liver cell carcinoma: Secondary | ICD-10-CM

## 2020-01-30 HISTORY — PX: IR RADIOLOGIST EVAL & MGMT: IMG5224

## 2020-01-30 NOTE — Progress Notes (Signed)
Spoke with patient regarding appointment for MR of Liver.  It has been scheduled for Thursday 8/5 at 5:00 pm at [facility] to arrive by 4:30 pm. NPO for 4 hours prior. I asked him to not eat or drink after 12:30 pm that day.  Patient verbalized an understanding and agreed to this appointment.

## 2020-01-30 NOTE — Consult Note (Signed)
Chief Complaint: Multi-focal Dillon Perkins   Referring Physician(s): Betsy Coder B  PCP: Dr. Antony Contras, [facility]  History of Present Illness: Dillon Perkins is a 69 y.o. male presenting today as a scheduled consultation to [place] clinic, kindly referred by Dr. Benay Spice, for evaluation of his multi-focal [name] and candidacy for liver directed therapy.   Dillon Perkins joins [place] today virtually, and we confirmed his identity with 2 personal identifiers.   He tells me that his liver disease was discovered incidentally recently as he was being worked-up by Urology for hematuria.  A CT was performed which discovered liver lesions.   He denies any nausea, vomiting, early satiety, hematemesis, hematochezia/melena, or other GI symptoms.  He denies any abdominal pain.    He has had testing for hepatitis, which are negative.  He tells me that he was previously an ETOH user, but has long since quite drinking.    CT performed 01/02/20 shows: Left liver mass ~8 cm, satellite nodule of ~68m Left liver mass ~3.2cm Left liver nodule ~160mRight liver mass ~4.8cm, satellite nodule of ~1179might liver nodule ~35m4mght liver nodule ~10mm60ms AFP is 12.4 on 01/18/20.   He has a pending MRI next week on Thursday.   CMP 01/18/20: Cr: 1.61, GFR 43 Albumin 3.1 AST: 65 Alk phos: 152 Tbili: 2.0  Child-Pugh (calculated with presumed normal INR) is B, 7.    He is active daily, able to complete all of his daily tasks without trouble.  ECOG 0.    Past Medical History:  Diagnosis Date  . Atrial fibrillation (HCC) [place] Hyperlipidemia   . Hypertension     Past Surgical History:  Procedure Laterality Date  . APPENDECTOMY    . IR RADIOLOGIST EVAL & MGMT  01/30/2020    Allergies: Patient has no known allergies.  Medications: Prior to Admission medications   Medication Sig Start Date End Date Taking? Authorizing Provider  amLODipine (NORVASC) 10 MG tablet Take 10 mg by mouth daily.  12/26/19   [provider]  apixaban (ELIQUIS) 5 MG TABS tablet Take 5 mg by mouth 2 (two) times daily.    [provider]  Coenzyme Q10 (COQ10) 100 MG CAPS Take by mouth daily.    [provider]  metoprolol succinate (TOPROL-XL) 100 MG 24 hr tablet Take 1 tablet (100 mg total) by mouth daily. 02/08/19   HochrMinus Breeding pravastatin (PRAVACHOL) 40 MG tablet Take 1 tablet by mouth daily. 09/26/18   [provider]  tamsulosin (FLOMAX) 0.4 MG CAPS capsule Take 0.4 mg by mouth daily. 01/14/20   [provider]     Family History  Problem Relation Age of Onset  . Heart disease Father 60   61  Heart attack  . Heart disease Sister 63   40  Heart attack  . Breast cancer Mother     Social History   Socioeconomic History  . Marital status: Married    Spouse name: Not on file  . Number of children: Not on file  . Years of education: Not on file  . Highest education level: Not on file  Occupational History  . Not on file  Tobacco Use  . Smoking status: Former Smoker    Packs/day: 1.00    Years: 0.50    Pack years: 0.50    Types: Cigarettes    Quit date: 08/21/1972    Years since quitting: 47.4  . Smokeless tobacco:  Never Used  Substance and Sexual Activity  . Alcohol use: Yes    Alcohol/week: 2.0 standard drinks    Types: 2 Cans of beer per week  . Drug use: No  . Sexual activity: Not on file  Other Topics Concern  . Not on file  Social History Narrative   Lives with wife.  Retired.  Three sons.    Social Determinants of Health   Financial Resource Strain:   . Difficulty of Paying Living Expenses:   Food Insecurity:   . Worried About Charity fundraiser in the Last Year:   . Arboriculturist in the Last Year:   Transportation Needs:   . Film/video editor (Medical):   Marland Kitchen Lack of Transportation (Non-Medical):   Physical Activity:   . Days of Exercise per Week:   . Minutes of Exercise per Session:   Stress:   . Feeling of  Stress :   Social Connections:   . Frequency of Communication with Friends and Family:   . Frequency of Social Gatherings with Friends and Family:   . Attends Religious Services:   . Active Member of Clubs or Organizations:   . Attends Archivist Meetings:   Marland Kitchen Marital Status:     ECOG Status: 0 - Asymptomatic  Review of Systems  Review of Systems: A 12 point ROS discussed and pertinent positives are indicated in the HPI above.  All other systems are negative.  Physical Exam No direct physical exam was performed (except for noted visual exam findings with Video Visits).    Vital Signs: There were no vitals taken for this visit.  Imaging: IR Radiologist Eval & Mgmt  Result Date: 01/30/2020 Please refer to notes tab for details about interventional procedure. (Op Note)   Labs:  CBC: Recent Labs    01/18/20 1537  WBC 5.5  HGB 12.6*  HCT 37.7*  PLT 104*    COAGS: No results for input(s): INR, APTT in the last 8760 hours.  BMP: Recent Labs    01/18/20 1537  NA 141  K 4.1  CL 109  CO2 23  GLUCOSE 83  BUN 32*  CALCIUM 9.1  CREATININE 1.61*  GFRNONAA 43*  GFRAA 49*    LIVER FUNCTION TESTS: Recent Labs    01/18/20 1537  BILITOT 2.0*  AST 65*  ALT 41  ALKPHOS 152*  PROT 7.7  ALBUMIN 3.1*    TUMOR MARKERS: No results for input(s): AFPTM, CEA, CA199, CHROMGRNA in the last 8760 hours.  Assessment and Plan:  Dillon Perkins is a 70 year old male with recently discovered multiple liver masses, which are compatible with multi-focal HCC.   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   AFP is 12.4.   I had a lengthy conversation with Dillon Mcnease today regarding the incidence of [place], lack of screening studies, natural history, staging, and how all of this fits together with selecting treatment strategies.  I also discussed the possible treatments of HCC, including  surgery, systemic therapies, and liver-directed therapies such as ablation, trans-arterial (bland embo, trans-arterial chemo-embolization, and selective internal radiation/y90).   We discussed surgery, for which he is likely not a candidate, as the only true cure, but as our alternative treatments as adequate for palliation of the malignancy to improve survival.    We discussed trans-arterial therapy as the primary treatment in his scenario.    Given that he has  bi-lobar multi-focal disease with the largest tumor in the left being ~8cm, and as many as 5 or 6 additional lesions to address, he is not ablation candidate.  Furthermore, we would be unable, I believe to selectively catheterize and confidently treat each of these potential malignancies with bland embo, TACE, DEB-TACE, or SIRT.   Thus, I think the best strategy, given that a bi-lobar y90 treatment is likely (pending MRI and y90 mapping), is to use a combination of both DEB-TACE and y90.  Tentatively, I believe we would be able to perform a DEB-TACE treatment to the largest of the left liver tumors, the 8cm tumor, on the day of y90 mapping.  After that, we would plan on bi-lobar y90.  This could be with or without adjunctive systemic therapy.   I had a complete informed consent with Dillon. Pignato regarding trans-arterial treatment including risks: bleeding, infection, artery injury, contrast reaction, kidney injury, need for hospitalization, need for additional surgery/procedure, non-target embolization, post-embolization syndrome, cardiopulmonary collapse, death. After our discussion, he would like to proceed with therapy.   Plan: - Plan for y90 mapping with Dr. Earleen Newport at [facility], anticipating both right and left liver lobe therapy.  On the same day of mapping procedure, we will plan for DEB-TACE of the ~8cm left liver mass, which is the most concerning tumor at this time.  Our first y90 treatment can then be in 2-3 weeks.  - I have encouraged Dillon  Coury to observe his MRI appointment next week.   Thank you for this interesting consult.  I greatly enjoyed meeting TYMERE DEPUY and look forward to participating in their care.  A copy of this report was sent to the requesting provider on this date.  Electronically Signed: Corrie Mckusick 01/30/2020, 4:38 PM   I spent a total of  60 Minutes   in remote  clinical consultation, greater than 50% of which was counseling/coordinating care for multifocal HCC, possible liver directed therapy with y90 and/or DEB-TACE.    Visit type: Audio only (telephone). Audio (no video) only due to patient's lack of internet/smartphone capability. Alternative for in-person consultation at [facility], [address] [place], [place], [place]. This visit type was conducted due to national recommendations for restrictions regarding the COVID-19 Pandemic (e.g. social distancing).  This format is felt to be most appropriate for this patient at this time.  All issues noted in this document were discussed and addressed.

## 2020-02-01 ENCOUNTER — Other Ambulatory Visit (HOSPITAL_COMMUNITY): Payer: Self-pay | Admitting: Interventional Radiology

## 2020-02-01 DIAGNOSIS — C220 Liver cell carcinoma: Secondary | ICD-10-CM

## 2020-02-01 DIAGNOSIS — K769 Liver disease, unspecified: Secondary | ICD-10-CM

## 2020-02-08 ENCOUNTER — Telehealth: Payer: Self-pay | Admitting: *Deleted

## 2020-02-08 ENCOUNTER — Other Ambulatory Visit: Payer: Self-pay

## 2020-02-08 ENCOUNTER — Ambulatory Visit (HOSPITAL_COMMUNITY)
Admission: RE | Admit: 2020-02-08 | Discharge: 2020-02-08 | Disposition: A | Discharge status: Home / Self Care | Payer: Medicare Other | Source: Ambulatory Visit | Attending: Oncology | Admitting: Oncology

## 2020-02-08 DIAGNOSIS — C220 Liver cell carcinoma: Secondary | ICD-10-CM | POA: Diagnosis not present

## 2020-02-08 DIAGNOSIS — K8689 Other specified diseases of pancreas: Secondary | ICD-10-CM | POA: Diagnosis not present

## 2020-02-08 DIAGNOSIS — K769 Liver disease, unspecified: Secondary | ICD-10-CM

## 2020-02-08 DIAGNOSIS — K7460 Unspecified cirrhosis of liver: Secondary | ICD-10-CM | POA: Diagnosis not present

## 2020-02-08 DIAGNOSIS — K8020 Calculus of gallbladder without cholecystitis without obstruction: Secondary | ICD-10-CM | POA: Diagnosis not present

## 2020-02-08 MED ORDER — GADOBUTROL 1 MMOL/ML IV SOLN
10.0000 mL | Freq: Once | INTRAVENOUS | Status: AC | PRN
  Administered 2020-02-08: 10 mL via INTRAVENOUS

## 2020-02-08 NOTE — Telephone Encounter (Signed)
Wife called concerned that appointment with Dr. Berniece Pap at [facility] was canceled and she can't see her till September. Were given an appointment tomorrow with Dr. Fuller Plan. Asking if they should see him?  Called back and informed her, yes see Dr. Fuller Plan. Berniece Pap is a NP at [facility] and seeing the doctor is optimal.

## 2020-02-09 ENCOUNTER — Ambulatory Visit (INDEPENDENT_AMBULATORY_CARE_PROVIDER_SITE_OTHER): Payer: Medicare Other | Admitting: Gastroenterology

## 2020-02-09 ENCOUNTER — Ambulatory Visit: Payer: Medicare Other | Admitting: Nurse Practitioner

## 2020-02-09 ENCOUNTER — Telehealth: Payer: Self-pay

## 2020-02-09 ENCOUNTER — Other Ambulatory Visit (INDEPENDENT_AMBULATORY_CARE_PROVIDER_SITE_OTHER): Payer: Medicare Other

## 2020-02-09 VITALS — BP 122/64 | HR 79 | Ht 68.0 in | Wt 217.2 lb

## 2020-02-09 DIAGNOSIS — K7460 Unspecified cirrhosis of liver: Secondary | ICD-10-CM

## 2020-02-09 DIAGNOSIS — R188 Other ascites: Secondary | ICD-10-CM

## 2020-02-09 DIAGNOSIS — Z23 Encounter for immunization: Secondary | ICD-10-CM

## 2020-02-09 DIAGNOSIS — Z7901 Long term (current) use of anticoagulants: Secondary | ICD-10-CM | POA: Diagnosis not present

## 2020-02-09 LAB — CBC WITH DIFFERENTIAL/PLATELET
Basophils Absolute: 0.1 10*3/uL (ref 0.0–0.1)
Basophils Relative: 1.2 % (ref 0.0–3.0)
Eosinophils Absolute: 0.3 10*3/uL (ref 0.0–0.7)
Eosinophils Relative: 4.6 % (ref 0.0–5.0)
HCT: 36.5 % — ABNORMAL LOW (ref 39.0–52.0)
Hemoglobin: 12.6 g/dL — ABNORMAL LOW (ref 13.0–17.0)
Lymphocytes Relative: 21.6 % (ref 12.0–46.0)
Lymphs Abs: 1.3 10*3/uL (ref 0.7–4.0)
MCHC: 34.4 g/dL (ref 30.0–36.0)
MCV: 104.5 fl — ABNORMAL HIGH (ref 78.0–100.0)
Monocytes Absolute: 0.7 10*3/uL (ref 0.1–1.0)
Monocytes Relative: 11.3 % (ref 3.0–12.0)
Neutro Abs: 3.6 10*3/uL (ref 1.4–7.7)
Neutrophils Relative %: 61.3 % (ref 43.0–77.0)
Platelets: 115 10*3/uL — ABNORMAL LOW (ref 150.0–400.0)
RBC: 3.49 Mil/uL — ABNORMAL LOW (ref 4.22–5.81)
RDW: 13.8 % (ref 11.5–15.5)
WBC: 5.9 10*3/uL (ref 4.0–10.5)

## 2020-02-09 LAB — COMPREHENSIVE METABOLIC PANEL
ALT: 44 U/L (ref 0–53)
AST: 62 U/L — ABNORMAL HIGH (ref 0–37)
Albumin: 3.4 g/dL — ABNORMAL LOW (ref 3.5–5.2)
Alkaline Phosphatase: 122 U/L — ABNORMAL HIGH (ref 39–117)
BUN: 39 mg/dL — ABNORMAL HIGH (ref 6–23)
CO2: 25 mEq/L (ref 19–32)
Calcium: 9.1 mg/dL (ref 8.4–10.5)
Chloride: 107 mEq/L (ref 96–112)
Creatinine, Ser: 1.83 mg/dL — ABNORMAL HIGH (ref 0.40–1.50)
GFR: 36.7 mL/min — ABNORMAL LOW (ref >60.00)
Glucose, Bld: 79 mg/dL (ref 70–99)
Potassium: 4.1 mEq/L (ref 3.5–5.1)
Sodium: 137 mEq/L (ref 135–145)
Total Bilirubin: 1.7 mg/dL — ABNORMAL HIGH (ref 0.2–1.2)
Total Protein: 7.7 g/dL (ref 6.0–8.3)

## 2020-02-09 LAB — PROTIME-INR
INR: 1.2 ratio — ABNORMAL HIGH (ref 0.8–1.0)
Prothrombin Time: 13.6 s — ABNORMAL HIGH (ref 9.6–13.1)

## 2020-02-09 LAB — FERRITIN: Ferritin: 1823 ng/mL — ABNORMAL HIGH (ref 22.0–322.0)

## 2020-02-09 LAB — AMMONIA: Ammonia: 32 umol/L (ref 11–35)

## 2020-02-09 LAB — IBC PANEL
Iron: 185 ug/dL — ABNORMAL HIGH (ref 42–165)
Saturation Ratios: 66.1 % — ABNORMAL HIGH (ref 20.0–50.0)
Transferrin: 200 mg/dL — ABNORMAL LOW (ref 212.0–360.0)

## 2020-02-09 LAB — IRON: Iron: 185 ug/dL — ABNORMAL HIGH (ref 42–165)

## 2020-02-09 NOTE — Progress Notes (Signed)
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 He is accompanied by his wife.  Cirrhosis and hepatocellular carcinoma were both recently diagnosed.  He has been evaluated by Dr. Benay Spice with treatment beginning on Monday.  He has a history of alcohol use and completely discontinued alcohol 1 month ago.  Ferritin is elevated 2019.  Hepatitis B and C antibodies negative.  No known history of liver disease no family history of liver disease.  He has no symptoms or complaints.  He has been followed by [facility] gastroenterology with his last colonoscopy about 5 years ago and he feels he is due for follow-up colonoscopy soon.  Denies weight loss, abdominal pain, constipation, diarrhea, change in stool caliber, melena, hematochezia, nausea, vomiting, dysphagia, reflux symptoms, chest pain.   No Known Allergies Outpatient Medications Prior to Visit  Medication Sig Dispense Refill  . amLODipine (NORVASC) 10 MG tablet Take 10 mg by mouth daily.    Marland Kitchen apixaban (ELIQUIS) 5 MG TABS tablet Take 5 mg by mouth 2 (two) times daily.    . Coenzyme Q10 (COQ10) 100 MG CAPS Take by mouth daily.    . metoprolol succinate (TOPROL-XL) 100 MG 24 hr tablet Take 1 tablet (100 mg total) by mouth daily. 30 tablet 4  . pravastatin (PRAVACHOL) 40 MG tablet Take 1 tablet by mouth daily.    . tamsulosin (FLOMAX) 0.4 MG CAPS capsule Take 0.4 mg by mouth daily.     No facility-administered medications prior to visit.   Past Medical History:  Diagnosis Date  . Atrial fibrillation ([place])   . Hepatocellular carcinoma ([place])   . Hyperlipidemia   . Hypertension    Past Surgical History:  Procedure Laterality Date  . APPENDECTOMY    . IR RADIOLOGIST EVAL & MGMT  01/30/2020   Social History   Socioeconomic History  . Marital status: Married    Spouse name: Not on file  . Number of children:  Not on file  . Years of education: Not on file  . Highest education level: Not on file  Occupational History  . Not on file  Tobacco Use  . Smoking status: Former Smoker    Packs/day: 1.00    Years: 0.50    Pack years: 0.50    Types: Cigarettes    Quit date: 08/21/1972    Years since quitting: 47.5  . Smokeless tobacco: Never Used  Vaping Use  . Vaping Use: Never used  Substance and Sexual Activity  . Alcohol use: Yes    Alcohol/week: 2.0 standard drinks    Types: 2 Cans of beer per week  . Drug use: No  . Sexual activity: Not on file  Other Topics Concern  . Not on file  Social History Narrative   Lives with wife.  Retired.  Three sons.    Social Determinants of Health   Financial Resource Strain:   . Difficulty of Paying Living Expenses:   Food Insecurity:   . Worried About Charity fundraiser in the Last Year:   . Arboriculturist in the Last Year:   Transportation Needs:   . Film/video editor (Medical):   Marland Kitchen Lack of Transportation (Non-Medical):   Physical Activity:   . Days of Exercise per Week:   . Minutes of Exercise per Session:   Stress:   . Feeling of Stress :   Social Connections:   .  Frequency of Communication with Friends and Family:   . Frequency of Social Gatherings with Friends and Family:   . Attends Religious Services:   . Active Member of Clubs or Organizations:   . Attends Archivist Meetings:   Marland Kitchen Marital Status:    Family History  Problem Relation Age of Onset  . Heart disease Father 62       Heart attack  . Heart disease Sister 73       Heart attack  . Breast cancer Mother   . Colon cancer Neg Hx   . Stomach cancer Neg Hx   . Pancreatic cancer Neg Hx   . Rectal cancer Neg Hx   . Esophageal cancer Neg Hx       Review of Systems: Pertinent positive and negative review of systems were noted in the above HPI section. All other review of systems were otherwise negative.   Physical Exam: General: Well developed, well  nourished, no acute distress Head: Normocephalic and atraumatic Eyes:  sclerae anicteric, EOMI Ears: Normal auditory acuity Mouth: Not examined, mask on during Covid-19 pandemic Neck: Supple, no masses or thyromegaly Lungs: Clear throughout to auscultation Heart: Regular rate and rhythm; no murmurs, rubs or bruits Abdomen: Soft, non tender and non distended. No masses, hepatosplenomegaly or hernias noted. Normal Bowel sounds Rectal: Not done Musculoskeletal: Symmetrical with no gross deformities  Skin: No lesions on visible extremities Pulses:  Normal pulses noted Extremities: No clubbing, cyanosis, edema or deformities noted Neurological: Alert oriented x 4, grossly nonfocal Cervical Nodes:  No significant cervical adenopathy Inguinal Nodes: No significant inguinal adenopathy Psychological:  Alert and cooperative. Normal mood and affect   Assessment and Recommendations:  1. 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  Numerous hyperenhancing hepatic lesions consistent with multifocal hepatocellular carcinoma.  Continue to completely avoid alcohol going forward.  No more than 2 g of Tylenol per day.  Hepatitis a and B vaccination protocol.  Standard hepatic serologies today including hemochromatosis DNA to evaluate for other causes of underlying liver disease. Also CBC, CMP, PT/INR today. Schedule EGD to evaluate for varices, portal gastropathy. The risks (including bleeding, perforation, infection, missed lesions, medication reactions and possible hospitalization or surgery if complications occur), benefits, and alternatives to endoscopy with possible biopsy and possible dilation were discussed with the patient and they consent to proceed.   2.  A. fib maintained on Eliquis anticoagulation.  If he has varices and/or portal hypertensive gastropathy he would be at higher risk of UGI bleeding on anticoagulation.  Hold Eliquis 2 days before procedure - will instruct  when and how to resume after procedure. Low but real risk of cardiovascular event such as heart attack, stroke, embolism, thrombosis or ischemia/infarct of other organs off Eliquis explained and need to seek urgent help if this occurs. The patient consents to proceed. Will communicate by phone or EMR with patient's prescribing provider to confirm that holding Eliquis is reasonable in this case.   3.  Request records from [facility] gastroenterology including prior colonoscopies.    cc: Antony Contras, MD [address] [place],  [place] [address]

## 2020-02-09 NOTE — Telephone Encounter (Signed)
1st attempt to reach patient to schedule appointment for surgical clearance. Lvm f

## 2020-02-09 NOTE — Telephone Encounter (Signed)
Primary Cardiologist:James Hochrein, MD  Chart reviewed as part of pre-operative protocol coverage. Because of Dillon Perkins's past medical history and time since last visit, he/she will require a follow-up visit in order to better assess preoperative cardiovascular risk.  Pre-op covering staff: - Please schedule appointment and call patient to inform them. - Please contact requesting surgeon's office via preferred method (i.e, phone, fax) to inform them of need for appointment prior to surgery.  If applicable, this message will also be routed to pharmacy pool and/or primary cardiologist for input on holding anticoagulant/antiplatelet agent as requested below so that this information is available at time of patient's appointment.   Deberah Pelton, NP  02/09/2020, 2:52 PM

## 2020-02-09 NOTE — Patient Instructions (Signed)
Your provider has requested that you go to the basement level for lab work before leaving today. Press "B" on the elevator. The lab is located at the first door on the left as you exit the elevator.  You have been scheduled for an endoscopy. Please follow written instructions given to you at your visit today. If you use inhalers (even only as needed), please bring them with you on the day of your procedure.  We have scheduled your next Twinrix (Hep A and Hep B) vaccination for 03/11/20 at 1:30pm.   Thank you for choosing me and [facility].  Pricilla Riffle. Dagoberto Ligas., MD., [facility]

## 2020-02-09 NOTE — Telephone Encounter (Signed)
[facility] Pre-operative Risk Assessment     Request for surgical clearance:     Endoscopy Procedure  What type of surgery is being performed?     EGD  When is this surgery scheduled?     03/19/20  What type of clearance is required ?   Pharmacy  Are there any medications that need to be held prior to surgery and how long? Eliquis x 2 days  Practice name and name of physician performing surgery?      [facility]  What is your office phone and fax number?      Phone- [phone]  [phone]  Anesthesia type (None, local, MAC, general) ?       MAC

## 2020-02-09 NOTE — Telephone Encounter (Signed)
Patient with diagnosis of A Fib on Eliquis for anticoagulation.    Procedure: EGD Date of procedure: 03/19/20  CHADS2-VASc score of  2 (HTN, AGE)  CrCl 59.44 mL/min  Per office protocol, patient can hold Eliquis for 2 days prior to procedure.   Of note, labs are current however patients last visit was a telemedicine visit on 11/25/18

## 2020-02-12 NOTE — Telephone Encounter (Signed)
Pt has been scheduled to see Dr. Percival Spanish 02/27/20.  Will let Dorisann Frames, CMA with [facility] know that we will address pt's clearance at that appt.

## 2020-02-14 ENCOUNTER — Telehealth: Payer: Self-pay | Admitting: Nurse Practitioner

## 2020-02-14 ENCOUNTER — Other Ambulatory Visit: Payer: Medicare Other

## 2020-02-14 ENCOUNTER — Other Ambulatory Visit: Payer: Self-pay

## 2020-02-14 ENCOUNTER — Inpatient Hospital Stay: Payer: Medicare Other | Attending: Oncology | Admitting: Oncology

## 2020-02-14 VITALS — BP 105/71 | HR 86 | Temp 96.5°F | Resp 17 | Ht 68.0 in | Wt 217.0 lb

## 2020-02-14 DIAGNOSIS — Z87891 Personal history of nicotine dependence: Secondary | ICD-10-CM | POA: Insufficient documentation

## 2020-02-14 DIAGNOSIS — E785 Hyperlipidemia, unspecified: Secondary | ICD-10-CM | POA: Insufficient documentation

## 2020-02-14 DIAGNOSIS — R7989 Other specified abnormal findings of blood chemistry: Secondary | ICD-10-CM | POA: Insufficient documentation

## 2020-02-14 DIAGNOSIS — N400 Enlarged prostate without lower urinary tract symptoms: Secondary | ICD-10-CM | POA: Insufficient documentation

## 2020-02-14 DIAGNOSIS — R3129 Other microscopic hematuria: Secondary | ICD-10-CM | POA: Diagnosis not present

## 2020-02-14 DIAGNOSIS — N289 Disorder of kidney and ureter, unspecified: Secondary | ICD-10-CM | POA: Diagnosis not present

## 2020-02-14 DIAGNOSIS — C220 Liver cell carcinoma: Secondary | ICD-10-CM | POA: Insufficient documentation

## 2020-02-14 DIAGNOSIS — I10 Essential (primary) hypertension: Secondary | ICD-10-CM | POA: Diagnosis not present

## 2020-02-14 NOTE — Telephone Encounter (Signed)
Scheduled appointments per 8/11 los. Left message with appointment date and times

## 2020-02-14 NOTE — Progress Notes (Signed)
[place]  Diagnosis: Hepatocellular carcinoma  INTERVAL HISTORY:   [name] returns for a scheduled visit. He saw Dr. Fuller Plan for evaluation of cirrhosis. The cirrhosis is felt to be related to alcohol use. He is scheduled for an upper endoscopy to evaluate for varices. He continues anticoagulation therapy. No bleeding. He feels well. No complaint. He has discontinued alcohol use.  He saw Dr. Earleen Newport to discuss hepatic directed therapy. He is not a candidate for ablation therapy. Dr. Earleen Newport has scheduled embolization of the dominant right hepatic lesion and mapping for Y 90 treatment on 02/19/2020. He subsequently plans to treat the left and right liver with Y 90.  Objective:  Vital signs in last 24 hours:  Blood pressure 105/71, pulse 86, temperature (!) 96.5 F (35.8 C), temperature source Axillary, resp. rate 17, height 5\' 8"  (1.727 m), weight 217 lb (98.4 kg), SpO2 99 %.    Resp: Lungs clear bilaterally Cardio: Irregular GI: No hepatosplenomegaly, no mass, nontender Vascular: No leg edema     Lab Results:  Lab Results  Component Value Date   WBC 5.9 02/09/2020   HGB 12.6 (L) 02/09/2020   HCT 36.5 (L) 02/09/2020   MCV 104.5 (H) 02/09/2020   PLT 115.0 (L) 02/09/2020   NEUTROABS 3.6 02/09/2020    CMP  Lab Results  Component Value Date   NA 137 02/09/2020   K 4.1 02/09/2020   CL 107 02/09/2020   CO2 25 02/09/2020   GLUCOSE 79 02/09/2020   BUN 39 (H) 02/09/2020   CREATININE 1.83 (H) 02/09/2020   CALCIUM 9.1 02/09/2020   PROT 7.7 02/09/2020   ALBUMIN 3.4 (L) 02/09/2020   AST 62 (H) 02/09/2020   ALT 44 02/09/2020   ALKPHOS 122 (H) 02/09/2020   BILITOT 1.7 (H) 02/09/2020   GFRNONAA 43 (L) 01/18/2020   GFRAA 49 (L) 01/18/2020    No results found for: CEA1  Lab Results  Component Value Date   INR 1.2 (H) 02/09/2020    Imaging:   Medications: I have reviewed the patient's current medications.   Assessment/Plan: 1. Hepatocellular  carcinoma  Multiple Li-Rad 5 liver lesions noted on a CT abdomen/pelvis 01/02/2020 including a 7.1 x 5.7 cm segment 2 lesion, there are right and left hepatic lesions  CT abdomen/pelvis 01/02/2020-cirrhosis with varices, multiple LR 5 liver lesions, 1.1 cm fluid density lesion in the pancreas head, splenomegaly, bilateral kidney stones, small amount of right perihepatic ascites  MRI liver 02/08/2020-cirrhosis, numerous hyperenhancing liver lesions, largest lesion in segment two measuring 7.9 x 6.1 cm, lesions are consistent with multifocal hepatocellular carcinoma, the three largest lesions are categorized as LI-RADS 5. 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 Hypertension 4. BPH 5. Hyperlipidemia 6. History of heavy alcohol use 7. Remote history of tobacco use 8. Renal insufficiency 9. Microscopic hematuria   Disposition: [name] has been diagnosed with multifocal hepatocellular carcinoma. I discussed the diagnosis and treatment options with [name] and his wife. He is not a candidate for transplant, surgery, or ablation. We discussed hepatic directed therapy with various embolization strategies. We also discussed systemic therapy options. He saw Dr. Earleen Newport and has been scheduled for embolization of the dominant left hepatic lesion to be followed by Y 90 treatment of the bilateral liver.  I will discuss the case with Dr.  Earleen Newport and present his case at the GI tumor conference. We will consider systemic treatment options after the completion of hepatic directed therapy.  [name] will return for an office visit after the Y 90 treatment is completed.  We discussed the diagnosis cirrhosis and rationale for follow-up with Dr. Fuller Plan. He is scheduled to undergo an upper  endoscopy to evaluate the extent of gastric varices and then discuss the risk/benefit of continuing anticoagulation therapy.  Betsy Coder, MD  02/14/2020  9:31 AM

## 2020-02-15 ENCOUNTER — Other Ambulatory Visit: Payer: Self-pay | Admitting: Radiology

## 2020-02-16 MED ORDER — LC BEADS 100-300UM IN SALINE
150.0000 mg | Freq: Once | Status: AC
  Administered 2020-02-19: 150 mg via INTRA_ARTERIAL
  Filled 2020-02-16: qty 75

## 2020-02-19 ENCOUNTER — Other Ambulatory Visit (HOSPITAL_COMMUNITY): Payer: Self-pay | Admitting: Interventional Radiology

## 2020-02-19 ENCOUNTER — Encounter (HOSPITAL_COMMUNITY): Payer: Self-pay

## 2020-02-19 ENCOUNTER — Encounter (HOSPITAL_COMMUNITY)
Admission: RE | Admit: 2020-02-19 | Discharge: 2020-02-19 | Disposition: A | Discharge status: Home / Self Care | Payer: Medicare Other | Source: Ambulatory Visit | Attending: Interventional Radiology | Admitting: Interventional Radiology

## 2020-02-19 ENCOUNTER — Ambulatory Visit (HOSPITAL_COMMUNITY)
Admission: RE | Admit: 2020-02-19 | Discharge: 2020-02-19 | Disposition: A | Discharge status: Home / Self Care | Payer: Medicare Other | Source: Ambulatory Visit | Attending: Interventional Radiology | Admitting: Interventional Radiology

## 2020-02-19 ENCOUNTER — Other Ambulatory Visit: Payer: Self-pay

## 2020-02-19 ENCOUNTER — Ambulatory Visit (HOSPITAL_COMMUNITY)
Admission: RE | Admit: 2020-02-19 | Discharge: 2020-02-19 | Disposition: A | Discharge status: Home / Self Care | Payer: Medicare Other | Source: Ambulatory Visit | Attending: Oncology | Admitting: Oncology

## 2020-02-19 DIAGNOSIS — Z87891 Personal history of nicotine dependence: Secondary | ICD-10-CM | POA: Insufficient documentation

## 2020-02-19 DIAGNOSIS — Z803 Family history of malignant neoplasm of breast: Secondary | ICD-10-CM | POA: Diagnosis not present

## 2020-02-19 DIAGNOSIS — E785 Hyperlipidemia, unspecified: Secondary | ICD-10-CM | POA: Insufficient documentation

## 2020-02-19 DIAGNOSIS — K769 Liver disease, unspecified: Secondary | ICD-10-CM

## 2020-02-19 DIAGNOSIS — Z7901 Long term (current) use of anticoagulants: Secondary | ICD-10-CM | POA: Insufficient documentation

## 2020-02-19 DIAGNOSIS — I4891 Unspecified atrial fibrillation: Secondary | ICD-10-CM | POA: Diagnosis not present

## 2020-02-19 DIAGNOSIS — I10 Essential (primary) hypertension: Secondary | ICD-10-CM | POA: Diagnosis not present

## 2020-02-19 DIAGNOSIS — C220 Liver cell carcinoma: Secondary | ICD-10-CM

## 2020-02-19 DIAGNOSIS — Z79899 Other long term (current) drug therapy: Secondary | ICD-10-CM | POA: Insufficient documentation

## 2020-02-19 DIAGNOSIS — Z8249 Family history of ischemic heart disease and other diseases of the circulatory system: Secondary | ICD-10-CM | POA: Insufficient documentation

## 2020-02-19 HISTORY — PX: IR EMBO ARTERIAL NOT HEMORR HEMANG INC GUIDE ROADMAPPING: IMG5448

## 2020-02-19 HISTORY — PX: IR ANGIOGRAM VISCERAL SELECTIVE: IMG657

## 2020-02-19 HISTORY — PX: IR ANGIOGRAM SELECTIVE EACH ADDITIONAL VESSEL: IMG667

## 2020-02-19 HISTORY — PX: IR EMBO TUMOR ORGAN ISCHEMIA INFARCT INC GUIDE ROADMAPPING: IMG5449

## 2020-02-19 HISTORY — PX: IR US GUIDE VASC ACCESS RIGHT: IMG2390

## 2020-02-19 LAB — CBC WITH DIFFERENTIAL/PLATELET
Abs Immature Granulocytes: 0.01 10*3/uL (ref 0.00–0.07)
Basophils Absolute: 0.1 10*3/uL (ref 0.0–0.1)
Basophils Relative: 1 %
Eosinophils Absolute: 0.3 10*3/uL (ref 0.0–0.5)
Eosinophils Relative: 5 %
HCT: 35 % — ABNORMAL LOW (ref 39.0–52.0)
Hemoglobin: 11.8 g/dL — ABNORMAL LOW (ref 13.0–17.0)
Immature Granulocytes: 0 %
Lymphocytes Relative: 20 %
Lymphs Abs: 1.2 10*3/uL (ref 0.7–4.0)
MCH: 35.5 pg — ABNORMAL HIGH (ref 26.0–34.0)
MCHC: 33.7 g/dL (ref 30.0–36.0)
MCV: 105.4 fL — ABNORMAL HIGH (ref 80.0–100.0)
Monocytes Absolute: 0.6 10*3/uL (ref 0.1–1.0)
Monocytes Relative: 10 %
Neutro Abs: 3.9 10*3/uL (ref 1.7–7.7)
Neutrophils Relative %: 64 %
Platelets: 145 10*3/uL — ABNORMAL LOW (ref 150–400)
RBC: 3.32 MIL/uL — ABNORMAL LOW (ref 4.22–5.81)
RDW: 13.7 % (ref 11.5–15.5)
WBC: 6.1 10*3/uL (ref 4.0–10.5)
nRBC: 0 % (ref 0.0–0.2)

## 2020-02-19 LAB — COMPREHENSIVE METABOLIC PANEL
ALT: 41 U/L (ref 0–44)
AST: 68 U/L — ABNORMAL HIGH (ref 15–41)
Albumin: 3.1 g/dL — ABNORMAL LOW (ref 3.5–5.0)
Alkaline Phosphatase: 118 U/L (ref 38–126)
Anion gap: 9 (ref 5–15)
BUN: 36 mg/dL — ABNORMAL HIGH (ref 8–23)
CO2: 19 mmol/L — ABNORMAL LOW (ref 22–32)
Calcium: 8.6 mg/dL — ABNORMAL LOW (ref 8.9–10.3)
Chloride: 110 mmol/L (ref 98–111)
Creatinine, Ser: 1.91 mg/dL — ABNORMAL HIGH (ref 0.61–1.24)
GFR calc Af Amer: 40 mL/min — ABNORMAL LOW (ref >60)
GFR calc non Af Amer: 35 mL/min — ABNORMAL LOW (ref >60)
Glucose, Bld: 95 mg/dL (ref 70–99)
Potassium: 4.2 mmol/L (ref 3.5–5.1)
Sodium: 138 mmol/L (ref 135–145)
Total Bilirubin: 1.6 mg/dL — ABNORMAL HIGH (ref 0.3–1.2)
Total Protein: 7.3 g/dL (ref 6.5–8.1)

## 2020-02-19 LAB — PROTIME-INR
INR: 1.2 (ref 0.8–1.2)
Prothrombin Time: 14.9 seconds (ref 11.4–15.2)

## 2020-02-19 MED ORDER — SODIUM CHLORIDE 0.9 % IV SOLN: INTRAVENOUS | Status: AC

## 2020-02-19 MED ORDER — ONDANSETRON HCL 4 MG/2ML IJ SOLN: 4.0000 mg | Freq: Four times a day (QID) | INTRAMUSCULAR | Status: DC | PRN

## 2020-02-19 MED ORDER — DIPHENHYDRAMINE HCL 50 MG/ML IJ SOLN
50.0000 mg | Freq: Once | INTRAMUSCULAR | Status: AC
  Administered 2020-02-19: 50 mg via INTRAVENOUS
  Filled 2020-02-19: qty 1

## 2020-02-19 MED ORDER — MIDAZOLAM HCL 2 MG/2ML IJ SOLN
INTRAMUSCULAR | Status: AC
  Filled 2020-02-19: qty 6

## 2020-02-19 MED ORDER — LIDOCAINE HCL (PF) 1 % IJ SOLN
INTRAMUSCULAR | Status: AC | PRN
  Administered 2020-02-19: 5 mL

## 2020-02-19 MED ORDER — IOHEXOL 300 MG/ML  SOLN
100.0000 mL | Freq: Once | INTRAMUSCULAR | Status: AC | PRN
  Administered 2020-02-19: 20 mL via INTRA_ARTERIAL

## 2020-02-19 MED ORDER — SODIUM CHLORIDE 0.9 % IV SOLN: INTRAVENOUS | Status: DC

## 2020-02-19 MED ORDER — TECHNETIUM TO 99M ALBUMIN AGGREGATED
4.4000 | Freq: Once | INTRAVENOUS | Status: AC | PRN
  Administered 2020-02-19: 4.4 via INTRAVENOUS

## 2020-02-19 MED ORDER — MIDAZOLAM HCL 2 MG/2ML IJ SOLN
INTRAMUSCULAR | Status: AC | PRN
  Administered 2020-02-19 (×3): 1 mg via INTRAVENOUS

## 2020-02-19 MED ORDER — DEXAMETHASONE SODIUM PHOSPHATE 10 MG/ML IJ SOLN: 8.0000 mg | Freq: Once | INTRAMUSCULAR | Status: DC

## 2020-02-19 MED ORDER — PIPERACILLIN-TAZOBACTAM 3.375 G IVPB: 3.3750 g | Freq: Once | INTRAVENOUS | Status: DC

## 2020-02-19 MED ORDER — DEXAMETHASONE SODIUM PHOSPHATE 10 MG/ML IJ SOLN
8.0000 mg | Freq: Once | INTRAMUSCULAR | Status: AC
  Administered 2020-02-19: 8 mg via INTRAVENOUS

## 2020-02-19 MED ORDER — KETOROLAC TROMETHAMINE 30 MG/ML IJ SOLN
30.0000 mg | Freq: Once | INTRAMUSCULAR | Status: AC
  Administered 2020-02-19: 30 mg via INTRAVENOUS
  Filled 2020-02-19: qty 1

## 2020-02-19 MED ORDER — METRONIDAZOLE IN NACL 5-0.79 MG/ML-% IV SOLN
500.0000 mg | INTRAVENOUS | Status: AC
  Administered 2020-02-19: 500 mg via INTRAVENOUS
  Filled 2020-02-19: qty 100

## 2020-02-19 MED ORDER — MIDAZOLAM HCL 5 MG/5ML IJ SOLN: INTRAMUSCULAR | Status: AC | PRN

## 2020-02-19 MED ORDER — SODIUM CHLORIDE 0.9 % IV SOLN
8.0000 mg | Freq: Once | INTRAVENOUS | Status: AC
  Administered 2020-02-19: 8 mg via INTRAVENOUS
  Filled 2020-02-19: qty 4

## 2020-02-19 MED ORDER — NALOXONE HCL 0.4 MG/ML IJ SOLN
INTRAMUSCULAR | Status: AC
  Filled 2020-02-19: qty 1

## 2020-02-19 MED ORDER — CEFAZOLIN SODIUM-DEXTROSE 2-4 GM/100ML-% IV SOLN
2.0000 g | INTRAVENOUS | Status: AC
  Administered 2020-02-19: 2 g via INTRAVENOUS
  Filled 2020-02-19: qty 100

## 2020-02-19 MED ORDER — DEXAMETHASONE SODIUM PHOSPHATE 10 MG/ML IJ SOLN
10.0000 mg | Freq: Once | INTRAMUSCULAR | Status: DC
  Filled 2020-02-19: qty 1

## 2020-02-19 MED ORDER — PANTOPRAZOLE SODIUM 40 MG IV SOLR: 40.0000 mg | Freq: Once | INTRAVENOUS | Status: DC

## 2020-02-19 MED ORDER — IOHEXOL 300 MG/ML  SOLN
100.0000 mL | Freq: Once | INTRAMUSCULAR | Status: AC | PRN
  Administered 2020-02-19: 57 mL via INTRA_ARTERIAL

## 2020-02-19 MED ORDER — FENTANYL CITRATE (PF) 100 MCG/2ML IJ SOLN
INTRAMUSCULAR | Status: AC
  Filled 2020-02-19: qty 4

## 2020-02-19 MED ORDER — IOHEXOL 300 MG/ML  SOLN
100.0000 mL | Freq: Once | INTRAMUSCULAR | Status: AC | PRN
  Administered 2020-02-19: 60 mL via INTRA_ARTERIAL

## 2020-02-19 MED ORDER — FLUMAZENIL 0.5 MG/5ML IV SOLN
INTRAVENOUS | Status: AC
  Filled 2020-02-19: qty 5

## 2020-02-19 MED ORDER — IOHEXOL 300 MG/ML  SOLN
100.0000 mL | Freq: Once | INTRAMUSCULAR | Status: AC | PRN
  Administered 2020-02-19: 50 mL via INTRA_ARTERIAL

## 2020-02-19 MED ORDER — LIDOCAINE HCL 1 % IJ SOLN
INTRAMUSCULAR | Status: AC
  Filled 2020-02-19: qty 20

## 2020-02-19 MED ORDER — FENTANYL CITRATE (PF) 100 MCG/2ML IJ SOLN
INTRAMUSCULAR | Status: AC | PRN
  Administered 2020-02-19 (×3): 50 ug via INTRAVENOUS

## 2020-02-19 NOTE — H&P (Addendum)
Chief Complaint: Patient was seen in consultation today for multi-focal hepatocellular carcinoma  Referring Physician(s): Dr. Benay Spice   Supervising Physician: Corrie Mckusick  Patient Status: [facility] - Out-pt  History of Present Illness: Dillon Perkins is a 69 y.o. male with a medical history that includes atrial fibrillation, HTN and hepatocellular carcinoma. During work up for hematuria the liver lesions were incidental findings.  MRI Liver 02/08/20: IMPRESSION: 1. Cirrhosis with numerous arterially hyperenhancing lesions as detailed above, the largest a subcapsular lesion in the anterior left lobe of the liver, hepatic segment II, measuring 7.9 x 6.1 cm, demonstrating evidence of washout and capsular enhancement. These lesions are consistent with multifocal hepatocellular carcinoma, the 3 largest lesions categorized as LI-RADS 5, 2 additional lesions categorized as LI-RADS 4, and multiple subcentimeter arterial hyperenhancing foci categorized as LI-RADS category 3, and within the limitations of cross modality comparison the lesions identified on prior examination are not significantly changed. 2. There are several fluid signal cystic lesions in the pancreas, largest measuring 1.3 cm in the pancreatic uncinate. 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 No pancreatic ductal dilatation. Recommend follow-up MRI in 2 years to ensure ongoing stability, if clinically appropriate given the presence of multifocal hepatocellular carcinoma. This recommendation follows ACR consensus guidelines: Management of Incidental Pancreatic Cysts: A White Paper of the ACR Incidental Findings Committee. J Am Coll Radiol 1610;96:045-409. 3. Trace perihepatic ascites. 4. Gastroesophageal varices. 5. Cholelithiasis.   Interventional Radiology has been asked to evaluate this patient for Y90 radioembolization and drug-eluting bead chemoembolization. This patient met with Dr.  Earleen Newport via tele-visit on 01/30/20. This procedure has been approved by Dr. Earleen Newport.   Past Medical History:  Diagnosis Date  . Atrial fibrillation ([place])   . Hepatocellular carcinoma ([place])   . Hyperlipidemia   . Hypertension     Past Surgical History:  Procedure Laterality Date  . APPENDECTOMY    . IR RADIOLOGIST EVAL & MGMT  01/30/2020    Allergies: Patient has no known allergies.  Medications: Prior to Admission medications   Medication Sig Start Date End Date Taking? Authorizing Provider  amLODipine (NORVASC) 10 MG tablet Take 10 mg by mouth daily. 12/26/19  Yes [provider]  Coenzyme Q10 (COQ10) 100 MG CAPS Take by mouth daily.   Yes [provider]  metoprolol succinate (TOPROL-XL) 100 MG 24 hr tablet Take 1 tablet (100 mg total) by mouth daily. 02/08/19  Yes Minus Breeding, MD  pravastatin (PRAVACHOL) 40 MG tablet Take 1 tablet by mouth daily. 09/26/18  Yes [provider]  tamsulosin (FLOMAX) 0.4 MG CAPS capsule Take 0.4 mg by mouth daily. 01/14/20  Yes [provider]  apixaban (ELIQUIS) 5 MG TABS tablet Take 5 mg by mouth 2 (two) times daily.    [provider]     Family History  Problem Relation Age of Onset  . Heart disease Father 49       Heart attack  . Heart disease Sister 32       Heart attack  . Breast cancer Mother   . Colon cancer Neg Hx   . Stomach cancer Neg Hx   . Pancreatic cancer Neg Hx   . Rectal cancer Neg Hx   . Esophageal cancer Neg Hx     Social History   Socioeconomic History  . Marital status: Married    Spouse name: Not on file  . Number of children: Not on file  . Years of education: Not on file  .  Highest education level: Not on file  Occupational History  . Not on file  Tobacco Use  . Smoking status: Former Smoker    Packs/day: 1.00    Years: 0.50    Pack years: 0.50    Types: Cigarettes    Quit date: 08/21/1972    Years since quitting: 47.5  . Smokeless tobacco: Never Used  Vaping  Use  . Vaping Use: Never used  Substance and Sexual Activity  . Alcohol use: Yes    Alcohol/week: 2.0 standard drinks    Types: 2 Cans of beer per week  . Drug use: No  . Sexual activity: Not on file  Other Topics Concern  . Not on file  Social History Narrative   Lives with wife.  Retired.  Three sons.    Social Determinants of Health   Financial Resource Strain:   . Difficulty of Paying Living Expenses:   Food Insecurity:   . Worried About Charity fundraiser in the Last Year:   . Arboriculturist in the Last Year:   Transportation Needs:   . Film/video editor (Medical):   Marland Kitchen Lack of Transportation (Non-Medical):   Physical Activity:   . Days of Exercise per Week:   . Minutes of Exercise per Session:   Stress:   . Feeling of Stress :   Social Connections:   . Frequency of Communication with Friends and Family:   . Frequency of Social Gatherings with Friends and Family:   . Attends Religious Services:   . Active Member of Clubs or Organizations:   . Attends Archivist Meetings:   Marland Kitchen Marital Status:     Review of Systems: A 12 point ROS discussed and pertinent positives are indicated in the HPI above.  All other systems are negative.  Review of Systems  Constitutional: Negative for appetite change and fatigue.  Respiratory: Negative for cough and shortness of breath.   Cardiovascular: Negative for chest pain and leg swelling.  Gastrointestinal: Negative for abdominal pain, diarrhea, nausea and vomiting.  Musculoskeletal: Negative for back pain.  Neurological: Negative for light-headedness and headaches.  Hematological: Does not bruise/bleed easily.    Vital Signs: BP 119/88   Pulse 76   Temp 97.7 F (36.5 C) (Oral)   Resp 18   SpO2 99%   Physical Exam HENT:     Mouth/Throat:     Mouth: Mucous membranes are moist.     Pharynx: Oropharynx is clear.  Cardiovascular:     Rate and Rhythm: Normal rate and regular rhythm.     Pulses: Normal pulses.      Heart sounds: Normal heart sounds.  Pulmonary:     Effort: Pulmonary effort is normal.     Breath sounds: Normal breath sounds.  Abdominal:     General: Bowel sounds are normal.     Palpations: Abdomen is soft.  Musculoskeletal:        General: Normal range of motion.  Skin:    General: Skin is warm.  Neurological:     Mental Status: He is alert and oriented to person, place, and time.     Imaging: MR LIVER W WO CONTRAST  Result Date: 02/09/2020 CLINICAL DATA:  Follow-up LI-RADS 5 lesions, cirrhosis EXAM: MRI ABDOMEN WITHOUT AND WITH CONTRAST TECHNIQUE: Multiplanar multisequence MR imaging of the abdomen was performed both before and after the administration of intravenous contrast. CONTRAST:  32m GADAVIST GADOBUTROL 1 MMOL/ML IV SOLN COMPARISON:  CT abdomen pelvis, 01/02/2020 FINDINGS:  Lower chest: No acute findings. Hepatobiliary: Cirrhotic morphology of the liver, there is diffusely heterogeneous, nodular enhancement of the liver parenchyma. There are numerous arterially hyperenhancing lesions, the largest a subcapsular lesion in the anterior left lobe of the liver, hepatic segment II, measuring 7.9 x 6.1 cm, demonstrating evidence of washout and capsular enhancement (series 16, image 26). There is an additional lesion inferiorly in hepatic segment II, measuring 3.3 x 2.9 cm and again demonstrating evidence of washout and capsule (series 16, image 37). Lesion adjacent to the gallbladder fossa, hepatic segment VI measuring 4.9 x 3.1 cm with washout and capsule (series 16, image 33). Lesion in the inferior left lobe of the liver, hepatic segment II, demonstrating arterial hyperenhancement but without evidence of washout or capsule, measuring 1.6 x 1.4 cm (series 16, image 48). Lesion in the central right lobe of the liver, hepatic segment V, measuring 1.5 x 1.4 cm, demonstrating arterial hyperenhancement without evidence of washout or capsule (series 16, image 26). There are several  additional subcentimeter hyperenhancing foci which are too small to characterize and extensive hypoenhancing nodularity throughout the liver. Gallstone in the gallbladder. Pancreas: 1.3 cm fluid signal cystic lesion of the pancreatic uncinate inferior to the central pancreatic duct and ampulla (series 3, image 17). 9 mm fluid signal cystic lesion in the pancreatic tail abutting the main pancreatic duct (series 5, image 21). There are additional very tiny fluid signal lesions of the pancreatic neck, measuring up to 4 mm (series 5, image 21). No associated contrast enhancement. No pancreatic ductal dilatation. No mass, inflammatory changes, or other parenchymal abnormality identified. Spleen:  Within normal limits in size and appearance. Adrenals/Urinary Tract: No masses identified. No evidence of hydronephrosis. Stomach/Bowel: Susceptibility artifact in the vicinity of the pylorus, likely related to a surgical clip. Visualized portions within the abdomen are otherwise unremarkable. Vascular/Lymphatic: No pathologically enlarged lymph nodes identified. No abdominal aortic aneurysm demonstrated. Gastroesophageal varices. Other:  Trace perihepatic ascites. Musculoskeletal: No suspicious bone lesions identified. IMPRESSION: 1. Cirrhosis with numerous arterially hyperenhancing lesions as detailed above, the largest a subcapsular lesion in the anterior left lobe of the liver, hepatic segment II, measuring 7.9 x 6.1 cm, demonstrating evidence of washout and capsular enhancement. These lesions are consistent with multifocal hepatocellular carcinoma, the 3 largest lesions categorized as LI-RADS 5, 2 additional lesions categorized as LI-RADS 4, and multiple subcentimeter arterial hyperenhancing foci categorized as LI-RADS category 3, and within the limitations of cross modality comparison the lesions identified on prior examination are not significantly changed. 2. There are several fluid signal cystic lesions in the pancreas,  largest measuring 1.3 cm in the pancreatic uncinate. 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 No pancreatic ductal dilatation. Recommend follow-up MRI in 2 years to ensure ongoing stability, if clinically appropriate given the presence of multifocal hepatocellular carcinoma. This recommendation follows ACR consensus guidelines: Management of Incidental Pancreatic Cysts: A White Paper of the ACR Incidental Findings Committee. J Am Coll Radiol 0071;21:975-883. 3. Trace perihepatic ascites. 4. Gastroesophageal varices. 5. Cholelithiasis. Electronically Signed   By: Eddie Candle M.D.   On: 02/09/2020 10:20   IR Radiologist Eval & Mgmt  Result Date: 01/30/2020 Please refer to notes tab for details about interventional procedure. (Op Note)   Labs:  CBC: Recent Labs    01/18/20 1537 02/09/20 1434 02/19/20 0724  WBC 5.5 5.9 6.1  HGB 12.6* 12.6* 11.8*  HCT 37.7* 36.5* 35.0*  PLT 104* 115.0* 145*    COAGS: Recent Labs  02/09/20 1434 02/19/20 0724  INR 1.2* 1.2    BMP: Recent Labs    01/18/20 1537 02/09/20 1434 02/19/20 0724  NA 141 137 138  K 4.1 4.1 4.2  CL 109 107 110  CO2 23 25 19*  GLUCOSE 83 79 95  BUN 32* 39* 36*  CALCIUM 9.1 9.1 8.6*  CREATININE 1.61* 1.83* 1.91*  GFRNONAA 43*  --  35*  GFRAA 49*  --  40*    LIVER FUNCTION TESTS: Recent Labs    01/18/20 1537 02/09/20 1434 02/19/20 0724  BILITOT 2.0* 1.7* 1.6*  AST 65* 62* 68*  ALT 41 44 41  ALKPHOS 152* 122* 118  PROT 7.7 7.7 7.3  ALBUMIN 3.1* 3.4* 3.1*    TUMOR MARKERS: No results for input(s): AFPTM, CEA, CA199, CHROMGRNA in the last 8760 hours.  Assessment and Plan:  Hepatocellular carcinoma: Dillon Perkins [place], 69 year old male, presents today to the [facility] department for pre-Y90 arterial mapping and drug-eluting bead chemoembolization (DEB-TACE).   Risks and benefits of pre-Y90 arterial mapping discussed with the patient including, but not  limited to bleeding, infection, vascular injury, post procedural pain, nausea, vomiting and fatigue, contrast induced renal failure, liver failure, radiation injury to the bowel, radiation induced cholecystitis, neutropenia and possible need for additional procedures.  Risks and benefits of DEB-TACE were discussed with the patient including, but not limited to, bleeding, infection, vascular injury, contrast induced renal failure, post procedural pain, nausea, vomiting, fatigue, progression of liver failure, chemical cholecystitis, or need for additional procedures.  This interventional procedure involves the use of X-rays and because of the nature of the planned procedure, it is possible that we will have prolonged use of X-ray fluoroscopy.  Potential radiation risks to you include (but are not limited to) the following: - A slightly elevated risk for cancer  several years later in life. This risk is typically less than 0.5% percent. This risk is low in comparison to the normal incidence of human cancer, which is 33% for women and 50% for men according to the [place]. - Radiation induced injury can include skin redness, resembling a rash, tissue breakdown / ulcers and hair loss (which can be temporary or permanent).   The likelihood of either of these occurring depends on the difficulty of the procedure and whether you are sensitive to radiation due to previous procedures, disease, or genetic conditions.   IF your procedure requires a prolonged use of radiation, you will be notified and given written instructions for further action.  It is your responsibility to monitor the irradiated area for the 2 weeks following the procedure and to notify your physician if you are concerned that you have suffered a radiation induced injury.    All of the patient's questions were answered, patient is agreeable to proceed.  The patient has been NPO. Labs and vitals have been reviewed. Mr. Verstraete  does take Eliquis - this has been held for the past 5 days.   Consent signed and in chart.  Thank you for this interesting consult.  I greatly enjoyed meeting ETIENNE MOWERS and look forward to participating in their care.  A copy of this report was sent to the requesting provider on this date.  Electronically Signed: Soyla Dryer, AGACNP-BC [phone] 02/19/2020, 8:15 AM   I spent a total of  30 Minutes   in face to face in clinical consultation, greater than 50% of which was counseling/coordinating care for pre-Y90 arterial mapping and DEB-TACE.

## 2020-02-19 NOTE — Discharge Instructions (Signed)
Please call Interventional Radiology clinic [phone] with any questions or concerns.  You may remove your dressing and shower tomorrow.   Moderate Conscious Sedation, Adult, Care After These instructions provide you with information about caring for yourself after your procedure. Your health care provider may also give you more specific instructions. Your treatment has been planned according to current medical practices, but problems sometimes occur. Call your health care provider if you have any problems or questions after your procedure. What can I expect after the procedure? After your procedure, it is common:  To feel sleepy for several hours.  To feel clumsy and have poor balance for several hours.  To have poor judgment for several hours.  To vomit if you eat too soon. Follow these instructions at home: For at least 24 hours after the procedure:   Do not: ? Participate in activities where you could fall or become injured. ? Drive. ? Use heavy machinery. ? Drink alcohol. ? Take sleeping pills or medicines that cause drowsiness. ? Make important decisions or sign legal documents. ? Take care of children on your own.  Rest. Eating and drinking  Follow the diet recommended by your health care provider.  If you vomit: ? Drink water, juice, or soup when you can drink without vomiting. ? Make sure you have little or no nausea before eating solid foods. General instructions  Have a responsible adult stay with you until you are awake and alert.  Take over-the-counter and prescription medicines only as told by your health care provider.  If you smoke, do not smoke without supervision.  Keep all follow-up visits as told by your health care provider. This is important. Contact a health care provider if:  You keep feeling nauseous or you keep vomiting.  You feel light-headed.  You develop a rash.  You have a fever. Get help right away if:  You have trouble  breathing. This information is not intended to replace advice given to you by your health care provider. Make sure you discuss any questions you have with your health care provider. Document Revised: 06/04/2017 Document Reviewed: 10/12/2015 Elsevier Patient Education  [place].   Hepatic Artery Radioembolization, Care After This sheet gives you information about how to care for yourself after your procedure. Your health care provider may also give you more specific instructions. If you have problems or questions, contact your health care provider. What can I expect after the procedure? After the procedure, it is common to have:  A slight fever for 1-2 weeks. If your fever gets worse, tell your health care provider.  Fatigue.  Loss of appetite. This should gradually improve after about 1 week.  Abdominal pain on your right side.  Soreness and tenderness in your groin area where the needle and catheter were placed (puncture site). Follow these instructions at home:  Puncture site care  Follow instructions from your health care provider about how to take care of the puncture site. Make sure you: ? Wash your hands with soap and water before you change your bandage (dressing). If soap and water are not available, use hand sanitizer. ? Change your dressing as told by your health care provider. ? Leave stitches (sutures), skin glue, or adhesive strips in place. These skin closures may need to stay in place for 2 weeks or longer. If adhesive strip edges start to loosen and curl up, you may trim the loose edges. Do not remove adhesive strips completely unless your health care provider tells  you to do that.  Check your puncture site every day for signs of infection. Check for: ? More redness, swelling, or pain. ? More fluid or blood. ? Warmth. ? Pus or a bad smell. Activity  Rest and return to your normal activities as told by your health care provider. Ask your health care  provider what activities are safe for you.  Do not drive for 24 hours after the procedure if you were given a medicine to help you relax (sedative).  Do not lift anything that is heavier than 10 lb (4.5 kg) until your health care provider says that it is safe. Medicines  Take over-the-counter and prescription medicines only as told by your health care provider.  Do not drive or use heavy machinery while taking prescription pain medicine. Radiation precautions After next procedure  For up to a week after your procedure, there will be a small amount of radioactivity near your liver. This is not especially dangerous to other people. However, you should follow these precautions for 7 days: ? Do not come in close contact with people. ? Do not sleep in the same bed as someone else. ? Do not hold children or babies. ? Do not have contact with pregnant women. General instructions  To prevent or treat constipation while you are taking prescription pain medicine, your health care provider may recommend that you: ? Drink enough fluid to keep your urine clear or pale yellow. ? Take over-the-counter or prescription medicines. ? Eat foods that are high in fiber, such as fresh fruits and vegetables, whole grains, and beans. ? Limit foods that are high in fat and processed sugars, such as fried and sweet foods.  Eat frequent small meals until your appetite returns. Follow instructions from your health care provider about eating or drinking restrictions.  Do not take baths, swim, or use a hot tub until your health care provider approves. You may take showers. Wash your puncture site with mild soap and water and pat the area dry.  Wear compression stockings as told by your health care provider. These stockings help to prevent blood clots and reduce swelling in your legs.  Keep all follow-up visits as told by your health care provider. This is important. You may need to have blood tests and imaging tests  done. Contact a health care provider if:  You have more redness, swelling, or pain around your puncture site.  You have more fluid or blood coming from your puncture site.  Your puncture site feels warm to the touch.  You have pus or a bad smell coming from your puncture site.  You have pain that: ? Gets worse. ? Does not get better with medicine. ? Feels like very bad heartburn. ? Is in the middle of your abdomen, above your belly button.  Your skin and the white parts of your eyes turn yellow (jaundice).  The color of your urine changes to dark brown.  The color of your stool changes to light yellow.  Your abdominal measurement (girth) increases in a short period of time.  You gain more than 5 lb (2.3 kg) in a short period of time. Get help right away if:  You have a fever that lasts longer than 2 weeks or is higher than what your health care provider told you to expect.  You develop any of the following in your legs: ? Pain. ? Swelling. ? Skin that is cold or pale or turns blue.  You have chest pain.  You have blood in your vomit, saliva, or stool.  You have trouble breathing. This information is not intended to replace advice given to you by your health care provider. Make sure you discuss any questions you have with your health care provider. Document Revised: 06/04/2017 Document Reviewed: 03/21/2016 Elsevier Patient Education  [place] Y-90 Radioembolization Discharge Instructions  AFTER NEXT PROCEDURE  You have been given a radioactive material during your procedure.  While it is safe for you to be discharged home from the hospital, you need to proceed directly home.    Do not use public transportation, including air travel, lasting more than 2 hours for 1 week.  Avoid crowded public places for 1 week.  Adult visitors should try to avoid close contact with you for 1 week.    Children and pregnant females should not visit or have close  contact with you for 1 week.  Items that you touch are not radioactive.  Do not sleep in the same bed as your partner for 1 week, and a condom should be used for sexual activity during the first 24 hours.  Your blood may be radioactive and caution should be used if any bleeding occurs during the recovery period.  Body fluids may be radioactive for 24 hours.  Wash your hands after voiding.  Men should sit to urinate.  Dispose of any soiled materials (flush down toilet or place in trash at home) during the first day.  Drink 6 to 8 glasses of fluids per day for 5 days to hydrate yourself.  If you need to see a doctor during the first week, you must let them know that you were treated with yttrium-90 microspheres, and will be slightly radioactive.  They can call Interventional Radiology [phone] with any questions.

## 2020-02-19 NOTE — Sedation Documentation (Signed)
To short stay 

## 2020-02-19 NOTE — Sedation Documentation (Signed)
To nuc med for study 

## 2020-02-19 NOTE — Procedures (Signed)
Interventional Radiology Procedure Note  Procedure:  US guided right CFA access, mesenteric angiogram for y90 mapping.  DEB-TACE of left segment 2/3 HCC, with 150mg  Doxorubicin loaded on LC beads, and additional embosphere embolization to stasis with 1 vial 100-300.  Coil embolization of GDA, as there is essentially a 4 vessel branch point, with GDA, segment 4 artery, LHA, and RHA. Future bi- lobar y90 is planned.  MAA dose delivered into the [facility] for hemostasis  .  Complications: None  Recommendations:  - Right hip straight x 4 hours - to NM for MAA scan,  - aggressive IV hydration, targeting ~2 L before DC home - 30mg  IV toradol before DC home - future y90 planned for August 27, first dose left liver - Do not submerge for 7 days - Routine wound care   Signed,  Dulcy Fanny. Earleen Newport, DO

## 2020-02-20 LAB — ANTI-NUCLEAR AB-TITER (ANA TITER): ANA Titer 1: 1:40 {titer} — ABNORMAL HIGH

## 2020-02-20 LAB — HEMOCHROMATOSIS DNA-PCR(C282Y,H63D)

## 2020-02-20 LAB — ANGIOTENSIN CONVERTING ENZYME: Angiotensin-Converting Enzyme: 89 U/L — ABNORMAL HIGH (ref 9–67)

## 2020-02-20 LAB — MITOCHONDRIAL ANTIBODIES: Mitochondrial M2 Ab, IgG: 47.3 U — ABNORMAL HIGH

## 2020-02-20 LAB — ANTI-SMOOTH MUSCLE ANTIBODY, IGG: Actin (Smooth Muscle) Antibody (IGG): 21 U — ABNORMAL HIGH (ref <20)

## 2020-02-20 LAB — ALPHA-1-ANTITRYPSIN: A-1 Antitrypsin, Ser: 191 mg/dL (ref 83–199)

## 2020-02-20 LAB — ANA: Anti Nuclear Antibody (ANA): POSITIVE — AB

## 2020-02-20 LAB — CERULOPLASMIN: Ceruloplasmin: 30 mg/dL (ref 18–36)

## 2020-02-26 NOTE — Progress Notes (Signed)
Cardiology Office Note   Date:  02/27/2020   ID:  Dillon Perkins, DOB 1951-02-06, MRN [id number]  PCP:  Antony Contras, MD  Cardiologist:   Minus Breeding, MD   Chief Complaint  Patient presents with  . Shortness of Breath      History of Present Illness: Dillon Perkins is a 69 y.o. male who presents for evaluation of atrial fib. He has had a mildly reduced EF.  He is currently being treated for hepatocellular carcinoma.  He has not really noticed his atrial fibrillation.  He does have some decreased exercise tolerance at times.  He will feel tired if he ambulates up an incline.  He does feel better if he stops and recovers quickly.  He gets a little dizzy.  He feels somewhat short of breath walking up an incline but is not describing PND or orthopnea.  He is not having any resting shortness of breath, presyncope or syncope.  He denies any chest pressure, neck or arm discomfort.  Of note he stopped taking his Eliquis because of cost.  Previously he was unable to regulate his warfarin.  He is also been off of his anticoagulation multiple times because of invasive procedures.  Of note previously he did have an elevated mild BNP.   Past Medical History:  Diagnosis Date  . Atrial fibrillation ([place])   . Hepatocellular carcinoma ([place])   . Hyperlipidemia   . Hypertension     Past Surgical History:  Procedure Laterality Date  . APPENDECTOMY    . IR ANGIOGRAM SELECTIVE EACH ADDITIONAL VESSEL  02/19/2020  . IR ANGIOGRAM SELECTIVE EACH ADDITIONAL VESSEL  02/19/2020  . IR ANGIOGRAM SELECTIVE EACH ADDITIONAL VESSEL  02/19/2020  . IR ANGIOGRAM SELECTIVE EACH ADDITIONAL VESSEL  02/19/2020  . IR ANGIOGRAM SELECTIVE EACH ADDITIONAL VESSEL  02/19/2020  . IR ANGIOGRAM SELECTIVE EACH ADDITIONAL VESSEL  02/19/2020  . IR ANGIOGRAM VISCERAL SELECTIVE  02/19/2020  . IR EMBO ARTERIAL NOT HEMORR HEMANG INC GUIDE ROADMAPPING  02/19/2020  . IR EMBO TUMOR ORGAN ISCHEMIA INFARCT INC GUIDE ROADMAPPING   02/19/2020  . IR RADIOLOGIST EVAL & MGMT  01/30/2020  . IR US GUIDE VASC ACCESS RIGHT  02/19/2020     Current Outpatient Medications  Medication Sig Dispense Refill  . amLODipine (NORVASC) 10 MG tablet Take 10 mg by mouth daily.    Marland Kitchen apixaban (ELIQUIS) 5 MG TABS tablet Take 5 mg by mouth 2 (two) times daily.    . Coenzyme Q10 (COQ10) 100 MG CAPS Take by mouth daily.    . metoprolol succinate (TOPROL-XL) 100 MG 24 hr tablet Take 1 tablet (100 mg total) by mouth daily. 30 tablet 4  . pravastatin (PRAVACHOL) 40 MG tablet Take 1 tablet by mouth daily.    . tamsulosin (FLOMAX) 0.4 MG CAPS capsule Take 0.4 mg by mouth daily.     No current facility-administered medications for this visit.    Allergies:   Patient has no known allergies.    ROS:  Please see the history of present illness.   Otherwise, review of systems are positive for none.   All other systems are reviewed and negative.    PHYSICAL EXAM: VS:  BP 102/64   Ht 5\' 8"  (1.727 m)   Wt 216 lb 9.6 oz (98.2 kg)   BMI 32.93 kg/m  , BMI Body mass index is 32.93 kg/m. GENERAL:  Well appearing NECK:  No jugular venous distention, waveform within normal limits, carotid upstroke brisk and symmetric,  no bruits, no thyromegaly LUNGS:  Clear to auscultation bilaterally CHEST:  Unremarkable HEART:  PMI not displaced or sustained,S1 and S2 within normal limits, no S3, no clicks, no rubs, no murmurs, irregular ABD:  Flat, positive bowel sounds normal in frequency in pitch, no bruits, no rebound, no guarding, no midline pulsatile mass, no hepatomegaly, no splenomegaly EXT:  2 plus pulses throughout, no edema, no cyanosis no clubbing    EKG:  EKG is ordered today. The ekg ordered today demonstrates atrial fibrillation, rate 71, axis within normal limits, intervals within normal limits, no acute ST-T wave changes.   Recent Labs: 02/19/2020: ALT 41; BUN 36; Creatinine, Ser 1.91; Hemoglobin 11.8; Platelets 145; Potassium 4.2; Sodium 138     Lipid Panel    Component Value Date/Time   CHOL 133 07/23/2014 0505   TRIG 122 07/23/2014 0505   HDL 35 (L) 07/23/2014 0505   CHOLHDL 3.8 07/23/2014 0505   VLDL 24 07/23/2014 0505   LDLCALC 74 07/23/2014 0505      Wt Readings from Last 3 Encounters:  02/27/20 216 lb 9.6 oz (98.2 kg)  02/14/20 217 lb (98.4 kg)  02/09/20 217 lb 4 oz (98.5 kg)      Other studies Reviewed: Additional studies/ records that were reviewed today include: Labs. Review of the above records demonstrates:  Please see elsewhere in the note.     ASSESSMENT AND PLAN:  SOB The patient's had essentially normal echo last year.  His BNP was previously mildly elevated.  I will check that again today.  Atrial fibrillation He remains in paroxysmal atrial fibrillation.  Have asked him to check his blood pressure pulse ox when he is feeling like he has some decreased exercise tolerance.   Dillon Perkins has a CHA2DS2 - VASc score of 2.  We talked about the risk of stroke without his Eliquis.  He thinks he will start on this when he is not having invasive procedures and we can work with him on trying to help with assistance with the cost.  Essential hypertension Blood pressures is  at target.  No change in therapy.   CKD III Creatinine was elevated at 1.91 which is up from previous but he has had some renal dysfunction for a while.  He does not report any work-up of this.  I do not see any imaging.  I have asked him to follow-up with Antony Contras, MD for further work-up and he might need referral to nephrology.  Covid education He has been vaccinated.   Current medicines are reviewed at length with the patient today.  The patient does not have concerns regarding medicines.  The following changes have been made:  no change  Labs/ tests ordered today include:   Orders Placed This Encounter  Procedures  . Brain natriuretic peptide  . EKG 12-Lead     Disposition:   FU with me in one year.      Signed, Minus Breeding, MD  02/27/2020 8:34 AM    [facility]

## 2020-02-27 ENCOUNTER — Ambulatory Visit (INDEPENDENT_AMBULATORY_CARE_PROVIDER_SITE_OTHER): Payer: Medicare Other | Admitting: Cardiology

## 2020-02-27 ENCOUNTER — Encounter: Payer: Self-pay | Admitting: Cardiology

## 2020-02-27 ENCOUNTER — Other Ambulatory Visit: Payer: Self-pay

## 2020-02-27 VITALS — BP 102/64 | Ht 68.0 in | Wt 216.6 lb

## 2020-02-27 DIAGNOSIS — I4821 Permanent atrial fibrillation: Secondary | ICD-10-CM

## 2020-02-27 DIAGNOSIS — Z7189 Other specified counseling: Secondary | ICD-10-CM | POA: Diagnosis not present

## 2020-02-27 DIAGNOSIS — R0602 Shortness of breath: Secondary | ICD-10-CM | POA: Diagnosis not present

## 2020-02-27 DIAGNOSIS — I10 Essential (primary) hypertension: Secondary | ICD-10-CM | POA: Diagnosis not present

## 2020-02-27 NOTE — Patient Instructions (Signed)
Medication Instructions:  Please take your Eliquis as prescribed *If you need a refill on your cardiac medications before your next appointment, please call your pharmacy*  Lab Work: Your physician recommends that you return for lab work: today (BNP) If you have labs (blood work) drawn today and your tests are completely normal, you will receive your results only by: Marland Kitchen MyChart Message (if you have MyChart) OR . A paper copy in the mail If you have any lab test that is abnormal or we need to change your treatment, we will call you to review the results.  Testing/Procedures: None ordered this visit  Follow-Up: At [facility], you and your health needs are our priority.  As part of our continuing mission to provide you with exceptional heart care, we have created designated Provider Care Teams.  These Care Teams include your primary Cardiologist (physician) and Advanced Practice Providers (APPs -  Physician Assistants and Nurse Practitioners) who all work together to provide you with the care you need, when you need it.  Your next appointment:   12 month(s)  You will receive a reminder letter in the mail two months in advance. If you don't receive a letter, please call our office to schedule the follow-up appointment.  The format for your next appointment:   In Person  Provider:   Minus Breeding, MD

## 2020-02-28 LAB — BRAIN NATRIURETIC PEPTIDE: BNP: 326.6 pg/mL — ABNORMAL HIGH (ref 0.0–100.0)

## 2020-02-28 NOTE — Telephone Encounter (Signed)
It looks like per Dr. Cherlyn Cushing office note that patient is no longer taking Eliquis. If this is correct and if he restarts, can he hold prior to EGD scheduled in September? Thanks

## 2020-02-28 NOTE — Telephone Encounter (Signed)
If he has restarted his Eliquis than he can hold it 2 days before his procedure and resume when safe post op.  Kerin Ransom PA-C 02/28/2020 1:51 PM

## 2020-02-28 NOTE — Telephone Encounter (Signed)
Patient states he has not started back on Eliquis at this time but understands if he does he can hold Eliquis 2 days prior to his procedure per Cardiology.

## 2020-02-29 ENCOUNTER — Other Ambulatory Visit: Payer: Self-pay | Admitting: Radiology

## 2020-02-29 ENCOUNTER — Telehealth: Payer: Self-pay

## 2020-02-29 NOTE — Telephone Encounter (Addendum)
Left a voice message for the patient to give the office a call back for his results.  ----- Message from Minus Breeding, MD sent at 02/28/2020  6:21 PM EDT ----- He does have a mildly elevated BNP.  If he is short of breath on a given day he could take 20 mg of Lasix but he would need to do this sparingly given his renal insufficiency.  Please prescribe 20 mg Lasix as needed daily.  He needs significant salt restriction if he is not already doing this.  Call Mr. Gabrielle with the results and send results to Antony Contras, MD

## 2020-03-01 ENCOUNTER — Other Ambulatory Visit (HOSPITAL_COMMUNITY): Payer: Self-pay | Admitting: Interventional Radiology

## 2020-03-01 ENCOUNTER — Encounter (HOSPITAL_COMMUNITY): Payer: Self-pay

## 2020-03-01 ENCOUNTER — Other Ambulatory Visit: Payer: Self-pay

## 2020-03-01 ENCOUNTER — Ambulatory Visit (HOSPITAL_COMMUNITY)
Admission: RE | Admit: 2020-03-01 | Discharge: 2020-03-01 | Disposition: A | Discharge status: Home / Self Care | Payer: Medicare Other | Source: Ambulatory Visit | Attending: Interventional Radiology | Admitting: Interventional Radiology

## 2020-03-01 ENCOUNTER — Ambulatory Visit (HOSPITAL_COMMUNITY)
Admission: RE | Admit: 2020-03-01 | Discharge: 2020-03-01 | Disposition: A | Discharge status: Home / Self Care | Payer: Medicare Other | Source: Ambulatory Visit | Attending: Oncology | Admitting: Oncology

## 2020-03-01 ENCOUNTER — Other Ambulatory Visit: Payer: Self-pay | Admitting: Nurse Practitioner

## 2020-03-01 DIAGNOSIS — I10 Essential (primary) hypertension: Secondary | ICD-10-CM | POA: Insufficient documentation

## 2020-03-01 DIAGNOSIS — K769 Liver disease, unspecified: Secondary | ICD-10-CM

## 2020-03-01 DIAGNOSIS — Z7901 Long term (current) use of anticoagulants: Secondary | ICD-10-CM | POA: Diagnosis not present

## 2020-03-01 DIAGNOSIS — C220 Liver cell carcinoma: Secondary | ICD-10-CM

## 2020-03-01 DIAGNOSIS — Z79899 Other long term (current) drug therapy: Secondary | ICD-10-CM | POA: Insufficient documentation

## 2020-03-01 DIAGNOSIS — E785 Hyperlipidemia, unspecified: Secondary | ICD-10-CM | POA: Diagnosis not present

## 2020-03-01 DIAGNOSIS — Z9889 Other specified postprocedural states: Secondary | ICD-10-CM | POA: Diagnosis not present

## 2020-03-01 DIAGNOSIS — I4891 Unspecified atrial fibrillation: Secondary | ICD-10-CM | POA: Insufficient documentation

## 2020-03-01 HISTORY — PX: IR EMBO TUMOR ORGAN ISCHEMIA INFARCT INC GUIDE ROADMAPPING: IMG5449

## 2020-03-01 HISTORY — PX: IR US GUIDE VASC ACCESS RIGHT: IMG2390

## 2020-03-01 HISTORY — PX: IR ANGIOGRAM VISCERAL SELECTIVE: IMG657

## 2020-03-01 HISTORY — PX: IR ANGIOGRAM SELECTIVE EACH ADDITIONAL VESSEL: IMG667

## 2020-03-01 LAB — COMPREHENSIVE METABOLIC PANEL
ALT: 41 U/L (ref 0–44)
AST: 51 U/L — ABNORMAL HIGH (ref 15–41)
Albumin: 3.1 g/dL — ABNORMAL LOW (ref 3.5–5.0)
Alkaline Phosphatase: 132 U/L — ABNORMAL HIGH (ref 38–126)
Anion gap: 11 (ref 5–15)
BUN: 45 mg/dL — ABNORMAL HIGH (ref 8–23)
CO2: 18 mmol/L — ABNORMAL LOW (ref 22–32)
Calcium: 8.6 mg/dL — ABNORMAL LOW (ref 8.9–10.3)
Chloride: 108 mmol/L (ref 98–111)
Creatinine, Ser: 1.83 mg/dL — ABNORMAL HIGH (ref 0.61–1.24)
GFR calc Af Amer: 42 mL/min — ABNORMAL LOW (ref >60)
GFR calc non Af Amer: 37 mL/min — ABNORMAL LOW (ref >60)
Glucose, Bld: 98 mg/dL (ref 70–99)
Potassium: 4.2 mmol/L (ref 3.5–5.1)
Sodium: 137 mmol/L (ref 135–145)
Total Bilirubin: 1.9 mg/dL — ABNORMAL HIGH (ref 0.3–1.2)
Total Protein: 7.4 g/dL (ref 6.5–8.1)

## 2020-03-01 LAB — CBC WITH DIFFERENTIAL/PLATELET
Abs Immature Granulocytes: 0.03 10*3/uL (ref 0.00–0.07)
Basophils Absolute: 0 10*3/uL (ref 0.0–0.1)
Basophils Relative: 1 %
Eosinophils Absolute: 0.2 10*3/uL (ref 0.0–0.5)
Eosinophils Relative: 3 %
HCT: 36.8 % — ABNORMAL LOW (ref 39.0–52.0)
Hemoglobin: 12 g/dL — ABNORMAL LOW (ref 13.0–17.0)
Immature Granulocytes: 0 %
Lymphocytes Relative: 15 %
Lymphs Abs: 1.1 10*3/uL (ref 0.7–4.0)
MCH: 35.3 pg — ABNORMAL HIGH (ref 26.0–34.0)
MCHC: 32.6 g/dL (ref 30.0–36.0)
MCV: 108.2 fL — ABNORMAL HIGH (ref 80.0–100.0)
Monocytes Absolute: 0.7 10*3/uL (ref 0.1–1.0)
Monocytes Relative: 10 %
Neutro Abs: 5.1 10*3/uL (ref 1.7–7.7)
Neutrophils Relative %: 71 %
Platelets: 188 10*3/uL (ref 150–400)
RBC: 3.4 MIL/uL — ABNORMAL LOW (ref 4.22–5.81)
RDW: 14.3 % (ref 11.5–15.5)
WBC: 7.1 10*3/uL (ref 4.0–10.5)
nRBC: 0 % (ref 0.0–0.2)

## 2020-03-01 LAB — PROTIME-INR
INR: 1.2 (ref 0.8–1.2)
Prothrombin Time: 14.7 seconds (ref 11.4–15.2)

## 2020-03-01 MED ORDER — PIPERACILLIN-TAZOBACTAM 3.375 G IVPB
INTRAVENOUS | Status: AC
  Filled 2020-03-01: qty 50

## 2020-03-01 MED ORDER — MIDAZOLAM HCL 2 MG/2ML IJ SOLN
INTRAMUSCULAR | Status: AC
  Filled 2020-03-01: qty 4

## 2020-03-01 MED ORDER — FENTANYL CITRATE (PF) 100 MCG/2ML IJ SOLN
INTRAMUSCULAR | Status: AC | PRN
  Administered 2020-03-01 (×2): 50 ug via INTRAVENOUS

## 2020-03-01 MED ORDER — LIDOCAINE HCL 1 % IJ SOLN
INTRAMUSCULAR | Status: AC
  Filled 2020-03-01: qty 20

## 2020-03-01 MED ORDER — DEXAMETHASONE SODIUM PHOSPHATE 10 MG/ML IJ SOLN
8.0000 mg | Freq: Once | INTRAMUSCULAR | Status: DC
  Filled 2020-03-01: qty 1

## 2020-03-01 MED ORDER — FENTANYL CITRATE (PF) 100 MCG/2ML IJ SOLN
INTRAMUSCULAR | Status: AC
  Filled 2020-03-01: qty 4

## 2020-03-01 MED ORDER — PANTOPRAZOLE SODIUM 40 MG IV SOLR
40.0000 mg | Freq: Once | INTRAVENOUS | Status: AC
  Administered 2020-03-01: 40 mg via INTRAVENOUS
  Filled 2020-03-01: qty 40

## 2020-03-01 MED ORDER — IOHEXOL 300 MG/ML  SOLN
100.0000 mL | Freq: Once | INTRAMUSCULAR | Status: AC | PRN
  Administered 2020-03-01: 40 mL via INTRA_ARTERIAL

## 2020-03-01 MED ORDER — SODIUM CHLORIDE 0.9 % IV SOLN: INTRAVENOUS | Status: DC

## 2020-03-01 MED ORDER — LIDOCAINE HCL (PF) 1 % IJ SOLN
INTRAMUSCULAR | Status: AC | PRN
  Administered 2020-03-01: 10 mL via INTRADERMAL

## 2020-03-01 MED ORDER — MIDAZOLAM HCL 2 MG/2ML IJ SOLN
INTRAMUSCULAR | Status: AC | PRN
  Administered 2020-03-01 (×3): 0.5 mg via INTRAVENOUS
  Administered 2020-03-01 (×2): 1 mg via INTRAVENOUS

## 2020-03-01 MED ORDER — IOHEXOL 300 MG/ML  SOLN
100.0000 mL | Freq: Once | INTRAMUSCULAR | Status: AC | PRN
  Administered 2020-03-01: 20 mL via INTRA_ARTERIAL

## 2020-03-01 MED ORDER — ONDANSETRON HCL 4 MG/2ML IJ SOLN
4.0000 mg | Freq: Once | INTRAMUSCULAR | Status: AC
  Administered 2020-03-01: 4 mg via INTRAVENOUS
  Filled 2020-03-01: qty 2

## 2020-03-01 MED ORDER — PIPERACILLIN-TAZOBACTAM 3.375 G IVPB: 3.3750 g | Freq: Once | INTRAVENOUS | Status: DC

## 2020-03-01 NOTE — Discharge Instructions (Signed)
Moderate Conscious Sedation, Adult, Care After These instructions provide you with information about caring for yourself after your procedure. Your health care provider may also give you more specific instructions. Your treatment has been planned according to current medical practices, but problems sometimes occur. Call your health care provider if you have any problems or questions after your procedure. What can I expect after the procedure? After your procedure, it is common:  To feel sleepy for several hours.  To feel clumsy and have poor balance for several hours.  To have poor judgment for several hours.  To vomit if you eat too soon. Follow these instructions at home: For at least 24 hours after the procedure:  Do not: ? Participate in activities where you could fall or become injured. ? Drive. ? Use heavy machinery. ? Drink alcohol. ? Take sleeping pills or medicines that cause drowsiness. ? Make important decisions or sign legal documents. ? Take care of children on your own.  Rest. Eating and drinking  Follow the diet recommended by your health care provider.  If you vomit: ? Drink water, juice, or soup when you can drink without vomiting. ? Make sure you have little or no nausea before eating solid foods. General instructions  Have a responsible adult stay with you until you are awake and alert.  Take over-the-counter and prescription medicines only as told by your health care provider.  If you smoke, do not smoke without supervision.  Keep all follow-up visits as told by your health care provider. This is important. Contact a health care provider if:  You keep feeling nauseous or you keep vomiting.  You feel light-headed.  You develop a rash.  You have a fever. Get help right away if:  You have trouble breathing. This information is not intended to replace advice given to you by your health care provider. Make sure you discuss any questions you have  with your health care provider. Document Revised: 06/04/2017 Document Reviewed: 10/12/2015 Elsevier Patient Education  [place] Y-90 Radioembolization Discharge Instructions  Urgent needs - IR on call MD [phone]  Wound - May remove dressing and shower in 24 to 48 hours.  Keep site clean and dry.  Replace with bandaid. Do not submerge in tub or water until site healing well.    You have been given a radioactive material during your procedure.  While it is safe for you to be discharged home from the hospital, you need to proceed directly home.  Items that you touch are not radioactive.  condom should be used for sexual activity during the first 24 hours.  Your blood may be radioactive and caution should be used if any bleeding occurs during the recovery period.  Wash your hands after voiding.    Drink 6 to 8 glasses of fluids per day for 5 days to hydrate yourself.  If you need to see a doctor during the first week, you must let them know that you were treated with yttrium-90 microspheres, and will be slightly radioactive.  They can call Interventional Radiology [phone] with any questions.   Femoral Site Care This sheet gives you information about how to care for yourself after your procedure. Your health care provider may also give you more specific instructions. If you have problems or questions, contact your health care provider. What can I expect after the procedure? After the procedure, it is common to have:  Bruising that usually fades within 1-2 weeks.  Tenderness at the  site. Follow these instructions at home: Wound care  Follow instructions from your health care provider about how to take care of your insertion site. Make sure you: ? Wash your hands with soap and water before you change your bandage (dressing). If soap and water are not available, use hand sanitizer. ? Change your dressing as told by your health care provider. ? Leave stitches  (sutures), skin glue, or adhesive strips in place. These skin closures may need to stay in place for 2 weeks or longer. If adhesive strip edges start to loosen and curl up, you may trim the loose edges. Do not remove adhesive strips completely unless your health care provider tells you to do that.  Do not take baths, swim, or use a hot tub until your health care provider approves.  You may shower 24-48 hours after the procedure or as told by your health care provider. ? Gently wash the site with plain soap and water. ? Pat the area dry with a clean towel. ? Do not rub the site. This may cause bleeding.  Do not apply powder or lotion to the site. Keep the site clean and dry.  Check your femoral site every day for signs of infection. Check for: ? Redness, swelling, or pain. ? Fluid or blood. ? Warmth. ? Pus or a bad smell. Activity  For the first 2-3 days after your procedure, or as long as directed: ? Avoid climbing stairs as much as possible. ? Do not squat.  Do not lift anything that is heavier than 10 lb (4.5 kg), or the limit that you are told, until your health care provider says that it is safe.  Rest as directed. ? Avoid sitting for a long time without moving. Get up to take short walks every 1-2 hours.  Do not drive for 24 hours if you were given a medicine to help you relax (sedative). General instructions  Take over-the-counter and prescription medicines only as told by your health care provider.  Keep all follow-up visits as told by your health care provider. This is important. Contact a health care provider if you have:  A fever or chills.  You have redness, swelling, or pain around your insertion site. Get help right away if:  The catheter insertion area swells very fast.  You pass out.  You suddenly start to sweat or your skin gets clammy.  The catheter insertion area is bleeding, and the bleeding does not stop when you hold steady pressure on the  area.  The area near or just beyond the catheter insertion site becomes pale, cool, tingly, or numb. These symptoms may represent a serious problem that is an emergency. Do not wait to see if the symptoms will go away. Get medical help right away. Call your local emergency services (911 in the [place] Do not drive yourself to the hospital. Summary  After the procedure, it is common to have bruising that usually fades within 1-2 weeks.  Check your femoral site every day for signs of infection.  Do not lift anything that is heavier than 10 lb (4.5 kg), or the limit that you are told, until your health care provider says that it is safe. This information is not intended to replace advice given to you by your health care provider. Make sure you discuss any questions you have with your health care provider. Document Revised: 07/05/2017 Document Reviewed: 07/05/2017 Elsevier Patient Education  [place].  Recommendations:  - Ok to shower tomorrow -  Do not submerge for 7 days - Routine wound care - right hip straight x 4 hours - DC home in 4 hours when goals met - follow up with Dr. Earleen Newport on schedule

## 2020-03-01 NOTE — H&P (Signed)
Referring Physician(s): Sherrill,B  Supervising Physician: Corrie Mckusick  Patient Status:  [facility] OP  Chief Complaint:  Multifocal hepatocellular carcinoma   Subjective: Dillon Perkins is a 69 year old male with history of multifocal [place] and largest tumors in the left liver.  He is status post hepatic/mesenteric arteriogram with Y 90 mapping study as well as DEB TACE treatment of left liver HCC/protective coil embolization of GDA and test Y 90 dosing on 02/19/2020.  He presents again today for left hepatic lobe Y-90 radioembolization. He currently denies fever,chest pain, dyspnea, cough,abdominal/back pain, nausea, vomiting or bleeding.  Past Medical History:  Diagnosis Date  . Atrial fibrillation ([place])   . Hepatocellular carcinoma ([place])   . Hyperlipidemia   . Hypertension    Past Surgical History:  Procedure Laterality Date  . APPENDECTOMY    . IR ANGIOGRAM SELECTIVE EACH ADDITIONAL VESSEL  02/19/2020  . IR ANGIOGRAM SELECTIVE EACH ADDITIONAL VESSEL  02/19/2020  . IR ANGIOGRAM SELECTIVE EACH ADDITIONAL VESSEL  02/19/2020  . IR ANGIOGRAM SELECTIVE EACH ADDITIONAL VESSEL  02/19/2020  . IR ANGIOGRAM SELECTIVE EACH ADDITIONAL VESSEL  02/19/2020  . IR ANGIOGRAM SELECTIVE EACH ADDITIONAL VESSEL  02/19/2020  . IR ANGIOGRAM VISCERAL SELECTIVE  02/19/2020  . IR EMBO ARTERIAL NOT HEMORR HEMANG INC GUIDE ROADMAPPING  02/19/2020  . IR EMBO TUMOR ORGAN ISCHEMIA INFARCT INC GUIDE ROADMAPPING  02/19/2020  . IR RADIOLOGIST EVAL & MGMT  01/30/2020  . IR US GUIDE VASC ACCESS RIGHT  02/19/2020       Allergies: Patient has no known allergies.  Medications: Prior to Admission medications   Medication Sig Start Date End Date Taking? Authorizing Provider  amLODipine (NORVASC) 10 MG tablet Take 10 mg by mouth daily. 12/26/19  Yes [provider]  Coenzyme Q10 (COQ10) 100 MG CAPS Take by mouth daily.   Yes [provider]  metoprolol succinate (TOPROL-XL) 100 MG 24 hr tablet Take 1  tablet (100 mg total) by mouth daily. 02/08/19  Yes Minus Breeding, MD  pravastatin (PRAVACHOL) 40 MG tablet Take 1 tablet by mouth daily. 09/26/18  Yes [provider]  tamsulosin (FLOMAX) 0.4 MG CAPS capsule Take 0.4 mg by mouth daily. 01/14/20  Yes [provider]  apixaban (ELIQUIS) 5 MG TABS tablet Take 5 mg by mouth 2 (two) times daily.    [provider]     Vital Signs:pend Ht 5\' 8"  (1.727 m)   Wt 216 lb (98 kg)   BMI 32.84 kg/m   Physical Exam awake, alert.  Chest clear to auscultation bilaterally; heart- nl rate, occ irregular rhythm (hx PAF); abd- protuberant,+BS,NT; no LE edema  Imaging: No results found.  Labs:  CBC: Recent Labs    01/18/20 1537 02/09/20 1434 02/19/20 0724  WBC 5.5 5.9 6.1  HGB 12.6* 12.6* 11.8*  HCT 37.7* 36.5* 35.0*  PLT 104* 115.0* 145*    COAGS: Recent Labs    02/09/20 1434 02/19/20 0724  INR 1.2* 1.2    BMP: Recent Labs    01/18/20 1537 02/09/20 1434 02/19/20 0724  NA 141 137 138  K 4.1 4.1 4.2  CL 109 107 110  CO2 23 25 19*  GLUCOSE 83 79 95  BUN 32* 39* 36*  CALCIUM 9.1 9.1 8.6*  CREATININE 1.61* 1.83* 1.91*  GFRNONAA 43*  --  35*  GFRAA 49*  --  40*    LIVER FUNCTION TESTS: Recent Labs    01/18/20 1537 02/09/20 1434 02/19/20 0724  BILITOT 2.0* 1.7* 1.6*  AST  65* 62* 68*  ALT 41 44 41  ALKPHOS 152* 122* 118  PROT 7.7 7.7 7.3  ALBUMIN 3.1* 3.4* 3.1*    Assessment and Plan: 69 year old male with history of multifocal HCC and largest tumors in the left liver.  He is status post hepatic/mesenteric arteriogram with Y 90 mapping study as well as DEB TACE treatment of left liver HCC/protective coil embolization of GDA and test Y 90 dosing on 02/19/2020.  He presents again today for left hepatic lobe Y-90 radioembolization. Risks and benefits of procedure were discussed with the patient including, but not limited to bleeding, infection, vascular injury or contrast induced renal  failure.  This interventional procedure involves the use of X-rays and because of the nature of the planned procedure, it is possible that we will have prolonged use of X-ray fluoroscopy.  Potential radiation risks to you include (but are not limited to) the following: - A slightly elevated risk for cancer  several years later in life. This risk is typically less than 0.5% percent. This risk is low in comparison to the normal incidence of human cancer, which is 33% for women and 50% for men according to the [place]. - Radiation induced injury can include skin redness, resembling a rash, tissue breakdown / ulcers and hair loss (which can be temporary or permanent).   The likelihood of either of these occurring depends on the difficulty of the procedure and whether you are sensitive to radiation due to previous procedures, disease, or genetic conditions.   IF your procedure requires a prolonged use of radiation, you will be notified and given written instructions for further action.  It is your responsibility to monitor the irradiated area for the 2 weeks following the procedure and to notify your physician if you are concerned that you have suffered a radiation induced injury.    All of the patient's questions were answered, patient is agreeable to proceed.  Consent signed and in chart.      Electronically Signed: D. Rowe Robert, PA-C 03/01/2020, 8:33 AM   I spent a total of 25 minutes at the the patient's bedside AND on the patient's hospital floor or unit, greater than 50% of which was counseling/coordinating care for hepatic/visceral arteriogram with left hepatic lobe Y-90 radioembolization

## 2020-03-01 NOTE — Procedures (Signed)
Interventional Radiology Procedure Note  Procedure:   US guided right CFA access Mesenteric angiogram Delivery of prescribed y90 dose to the left liver.   Exoseal for hemostasis  Assist: Dr. Suzy Bouchard  Complications: None  Recommendations:  - Ok to shower tomorrow - Do not submerge for 7 days - Routine wound care - right hip straight x 4 hours - DC home in 4 hours when goals met - follow up with Dr. Earleen Newport on schedule   Signed,  Dulcy Fanny. Earleen Newport, DO

## 2020-03-01 NOTE — Sedation Documentation (Signed)
Patient is resting comfortably, with eyes closed, snoring lightly, in NAD. 

## 2020-03-05 DIAGNOSIS — N400 Enlarged prostate without lower urinary tract symptoms: Secondary | ICD-10-CM | POA: Diagnosis not present

## 2020-03-05 DIAGNOSIS — N183 Chronic kidney disease, stage 3 unspecified: Secondary | ICD-10-CM | POA: Diagnosis not present

## 2020-03-05 DIAGNOSIS — E7800 Pure hypercholesterolemia, unspecified: Secondary | ICD-10-CM | POA: Diagnosis not present

## 2020-03-05 DIAGNOSIS — I4891 Unspecified atrial fibrillation: Secondary | ICD-10-CM | POA: Diagnosis not present

## 2020-03-05 DIAGNOSIS — I10 Essential (primary) hypertension: Secondary | ICD-10-CM | POA: Diagnosis not present

## 2020-03-06 ENCOUNTER — Other Ambulatory Visit: Payer: Self-pay

## 2020-03-08 ENCOUNTER — Ambulatory Visit: Payer: Medicare Other | Admitting: Nurse Practitioner

## 2020-03-08 ENCOUNTER — Other Ambulatory Visit: Payer: Medicare Other

## 2020-03-12 ENCOUNTER — Ambulatory Visit (INDEPENDENT_AMBULATORY_CARE_PROVIDER_SITE_OTHER): Payer: Medicare Other | Admitting: Gastroenterology

## 2020-03-12 DIAGNOSIS — R188 Other ascites: Secondary | ICD-10-CM | POA: Diagnosis not present

## 2020-03-12 DIAGNOSIS — K7460 Unspecified cirrhosis of liver: Secondary | ICD-10-CM

## 2020-03-12 DIAGNOSIS — Z23 Encounter for immunization: Secondary | ICD-10-CM | POA: Diagnosis not present

## 2020-03-13 ENCOUNTER — Other Ambulatory Visit: Payer: Self-pay

## 2020-03-13 ENCOUNTER — Telehealth: Payer: Self-pay | Admitting: Nurse Practitioner

## 2020-03-13 ENCOUNTER — Other Ambulatory Visit: Payer: Medicare Other

## 2020-03-13 ENCOUNTER — Encounter: Payer: Self-pay | Admitting: Nurse Practitioner

## 2020-03-13 ENCOUNTER — Inpatient Hospital Stay: Payer: Medicare Other | Attending: Oncology

## 2020-03-13 ENCOUNTER — Inpatient Hospital Stay (HOSPITAL_BASED_OUTPATIENT_CLINIC_OR_DEPARTMENT_OTHER): Payer: Medicare Other | Admitting: Nurse Practitioner

## 2020-03-13 ENCOUNTER — Ambulatory Visit: Payer: Medicare Other | Admitting: Nurse Practitioner

## 2020-03-13 VITALS — BP 145/62 | HR 76 | Temp 98.4°F | Resp 18 | Wt 220.3 lb

## 2020-03-13 DIAGNOSIS — I10 Essential (primary) hypertension: Secondary | ICD-10-CM | POA: Diagnosis not present

## 2020-03-13 DIAGNOSIS — N289 Disorder of kidney and ureter, unspecified: Secondary | ICD-10-CM | POA: Diagnosis not present

## 2020-03-13 DIAGNOSIS — C220 Liver cell carcinoma: Secondary | ICD-10-CM

## 2020-03-13 DIAGNOSIS — R3129 Other microscopic hematuria: Secondary | ICD-10-CM | POA: Insufficient documentation

## 2020-03-13 DIAGNOSIS — R161 Splenomegaly, not elsewhere classified: Secondary | ICD-10-CM | POA: Insufficient documentation

## 2020-03-13 DIAGNOSIS — K7460 Unspecified cirrhosis of liver: Secondary | ICD-10-CM | POA: Insufficient documentation

## 2020-03-13 DIAGNOSIS — E785 Hyperlipidemia, unspecified: Secondary | ICD-10-CM | POA: Diagnosis not present

## 2020-03-13 DIAGNOSIS — F1021 Alcohol dependence, in remission: Secondary | ICD-10-CM | POA: Insufficient documentation

## 2020-03-13 DIAGNOSIS — R7989 Other specified abnormal findings of blood chemistry: Secondary | ICD-10-CM | POA: Diagnosis not present

## 2020-03-13 DIAGNOSIS — I8390 Asymptomatic varicose veins of unspecified lower extremity: Secondary | ICD-10-CM | POA: Insufficient documentation

## 2020-03-13 DIAGNOSIS — N400 Enlarged prostate without lower urinary tract symptoms: Secondary | ICD-10-CM | POA: Insufficient documentation

## 2020-03-13 LAB — CMP (CANCER CENTER ONLY)
ALT: 30 U/L (ref 0–44)
AST: 47 U/L — ABNORMAL HIGH (ref 15–41)
Albumin: 2.6 g/dL — ABNORMAL LOW (ref 3.5–5.0)
Alkaline Phosphatase: 148 U/L — ABNORMAL HIGH (ref 38–126)
Anion gap: 7 (ref 5–15)
BUN: 37 mg/dL — ABNORMAL HIGH (ref 8–23)
CO2: 20 mmol/L — ABNORMAL LOW (ref 22–32)
Calcium: 8.5 mg/dL — ABNORMAL LOW (ref 8.9–10.3)
Chloride: 111 mmol/L (ref 98–111)
Creatinine: 1.76 mg/dL — ABNORMAL HIGH (ref 0.61–1.24)
GFR, Est AFR Am: 44 mL/min — ABNORMAL LOW (ref >60)
GFR, Estimated: 38 mL/min — ABNORMAL LOW (ref >60)
Glucose, Bld: 102 mg/dL — ABNORMAL HIGH (ref 70–99)
Potassium: 4.4 mmol/L (ref 3.5–5.1)
Sodium: 138 mmol/L (ref 135–145)
Total Bilirubin: 2 mg/dL — ABNORMAL HIGH (ref 0.3–1.2)
Total Protein: 7 g/dL (ref 6.5–8.1)

## 2020-03-13 NOTE — Telephone Encounter (Signed)
Scheduled per 9/8 los. No avs or calendar needed to be printed.

## 2020-03-13 NOTE — Progress Notes (Addendum)
  [place] OFFICE PROGRESS NOTE   Diagnosis:  Hepatocellular carcinoma  INTERVAL HISTORY:   [name] returns as scheduled. He underwent Y90 left hepatic lobe on 03/01/2020. He denies abdominal pain. Some fatigue. No fever. No diarrhea. He has a good appetite.   Objective:  Vital signs in last 24 hours:  Blood pressure (!) 145/62, pulse 76, temperature 98.4 F (36.9 C), temperature source Oral, resp. rate 18, weight 220 lb 4.8 oz (99.9 kg), SpO2 98 %.    Resp: Lungs clear.   Cardio: Irregular.  GI: Abdomen soft and nontender. No hepatomegaly.  Vascular: No leg edema.  Neuro: Alert and oriented.    Lab Results:  Lab Results  Component Value Date   WBC 7.1 03/01/2020   HGB 12.0 (L) 03/01/2020   HCT 36.8 (L) 03/01/2020   MCV 108.2 (H) 03/01/2020   PLT 188 03/01/2020   NEUTROABS 5.1 03/01/2020    Imaging:  No results found.  Medications: I have reviewed the patient's current medications.  Assessment/Plan: 1. Hepatocellular carcinoma ? Multiple Li-Rad 5 liver lesions noted on a CT abdomen/pelvis 01/02/2020 including a 7.1 x 5.7 cm segment 2 lesion, there are right and left hepatic lesions ? CT abdomen/pelvis 01/02/2020-cirrhosis with varices, multiple LR 5 liver lesions, 1.1 cm fluid density lesion in the pancreas head, splenomegaly, bilateral kidney stones, small amount of right perihepatic ascites ? MRI liver 02/08/2020-cirrhosis, numerous hyperenhancing liver lesions, largest lesion in segment two measuring 7.9 x 6.1 cm, lesions are consistent with multifocal hepatocellular carcinoma, the three largest lesions are categorized as LI-RADS 5. 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 Y90 left hepatic lobe 03/01/2020 2. 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 Hypertension 4. BPH 5. Hyperlipidemia 6. History of heavy alcohol use 7. Remote history of tobacco use 8. Renal insufficiency 9. Microscopic hematuria  Disposition: [name] appears stable. He completed Y90 to the left hepatic lobe on 03/01/2020. He is scheduled for Y90 right liver on 03/28/2020. Chemistry panel from today is pending.   He will return for follow-up here the week of 04/08/2020.  Patient seen with Dr. Benay Spice.    Ned Card ANP/GNP-BC   03/13/2020  9:17 AM  This was a shared visit with Ned Card. [name] appears stable. He tolerated the radio embolization well. The liver enzymes are unchanged. He is scheduled for an upper endoscopy next week.  We discussed treatment options with [name]. I discussed the case with Dr. Earleen Newport. Dr. Earleen Newport is concerned of potential toxicity with Y 90 treatment to the right liver given the persistent liver enzyme elevation and advanced cirrhosis. The plan is to hold on Y 90 to the right liver and obtain a restaging MRI approximately 6-8 weeks from the completion of the left liver Y 90. We will then decide on proceeding with systemic therapy.  The plan is to proceed with the upper endoscopy and management of cirrhosis per Dr. Fuller Plan.  I will contact [name] to review my discussion with Dr. Earleen Newport.  Julieanne Manson, MD

## 2020-03-19 ENCOUNTER — Encounter: Payer: Self-pay | Admitting: Gastroenterology

## 2020-03-19 ENCOUNTER — Ambulatory Visit (AMBULATORY_SURGERY_CENTER): Payer: Medicare Other | Admitting: Gastroenterology

## 2020-03-19 ENCOUNTER — Other Ambulatory Visit: Payer: Self-pay

## 2020-03-19 VITALS — BP 89/68 | HR 71 | Temp 96.6°F | Resp 16 | Ht 68.0 in | Wt 220.0 lb

## 2020-03-19 DIAGNOSIS — I8500 Esophageal varices without bleeding: Secondary | ICD-10-CM

## 2020-03-19 DIAGNOSIS — K7030 Alcoholic cirrhosis of liver without ascites: Secondary | ICD-10-CM | POA: Diagnosis not present

## 2020-03-19 DIAGNOSIS — K2270 Barrett's esophagus without dysplasia: Secondary | ICD-10-CM | POA: Diagnosis not present

## 2020-03-19 DIAGNOSIS — K766 Portal hypertension: Secondary | ICD-10-CM

## 2020-03-19 DIAGNOSIS — K449 Diaphragmatic hernia without obstruction or gangrene: Secondary | ICD-10-CM

## 2020-03-19 DIAGNOSIS — K319 Disease of stomach and duodenum, unspecified: Secondary | ICD-10-CM

## 2020-03-19 MED ORDER — PANTOPRAZOLE SODIUM 40 MG PO TBEC: 40.0000 mg | DELAYED_RELEASE_TABLET | Freq: Every day | ORAL | 3 refills | Status: DC

## 2020-03-19 MED ORDER — SODIUM CHLORIDE 0.9 % IV SOLN: 500.0000 mL | Freq: Once | INTRAVENOUS | Status: DC

## 2020-03-19 NOTE — Patient Instructions (Signed)
Handouts given for Hiatal Hernia and Barrett's esophagus.  Resume Eliquis at prior dose tomorrow.  YOU HAD AN ENDOSCOPIC PROCEDURE TODAY AT [facility]:   Refer to the procedure report that was given to you for any specific questions about what was found during the examination.  If the procedure report does not answer your questions, please call your gastroenterologist to clarify.  If you requested that your care partner not be given the details of your procedure findings, then the procedure report has been included in a sealed envelope for you to review at your convenience later.  YOU SHOULD EXPECT: Some feelings of bloating in the abdomen. Passage of more gas than usual.  Walking can help get rid of the air that was put into your GI tract during the procedure and reduce the bloating. If you had a lower endoscopy (such as a colonoscopy or flexible sigmoidoscopy) you may notice spotting of blood in your stool or on the toilet paper. If you underwent a bowel prep for your procedure, you may not have a normal bowel movement for a few days.  Please Note:  You might notice some irritation and congestion in your nose or some drainage.  This is from the oxygen used during your procedure.  There is no need for concern and it should clear up in a day or so.  SYMPTOMS TO REPORT IMMEDIATELY:  Following upper endoscopy (EGD)  Vomiting of blood or coffee ground material  New chest pain or pain under the shoulder blades  Painful or persistently difficult swallowing  New shortness of breath  Fever of 100F or higher  Black, tarry-looking stools  For urgent or emergent issues, a gastroenterologist can be reached at any hour by calling [phone]. Do not use MyChart messaging for urgent concerns.    DIET:  We do recommend a small meal at first, but then you may proceed to your regular diet.  Drink plenty of fluids but you should avoid alcoholic beverages for 24 hours.  ACTIVITY:  You  should plan to take it easy for the rest of today and you should NOT DRIVE or use heavy machinery until tomorrow (because of the sedation medicines used during the test).    FOLLOW UP: Our staff will call the number listed on your records 48-72 hours following your procedure to check on you and address any questions or concerns that you may have regarding the information given to you following your procedure. If we do not reach you, we will leave a message.  We will attempt to reach you two times.  During this call, we will ask if you have developed any symptoms of COVID 19. If you develop any symptoms (ie: fever, flu-like symptoms, shortness of breath, cough etc.) before then, please call [phone].  If you test positive for Covid 19 in the 2 weeks post procedure, please call and report this information to [place].    If any biopsies were taken you will be contacted by phone or by letter within the next 1-3 weeks.  Please call us at [phone] if you have not heard about the biopsies in 3 weeks.    SIGNATURES/CONFIDENTIALITY: You and/or your care partner have signed paperwork which will be entered into your electronic medical record.  These signatures attest to the fact that that the information above on your After Visit Summary has been reviewed and is understood.  Full responsibility of the confidentiality of this discharge information lies with you and/or your  care-partner.

## 2020-03-19 NOTE — Progress Notes (Signed)
Pt's states no medical or surgical changes since previsit or office visit. 

## 2020-03-19 NOTE — Op Note (Signed)
[place] Patient Name: Dillon Perkins Procedure Date: 03/19/2020 9:00 AM MRN: [id number] Endoscopist: Ladene Artist , MD Age: 69 Referring MD:  Date of Birth: 07-30-1950 Gender: Male Account #: [id number] Procedure:                Upper GI endoscopy Indications:              Screening procedure, Cirrhosis with suspected                            esophageal varices Medicines:                Monitored Anesthesia Care Procedure:                Pre-Anesthesia Assessment:                           - Prior to the procedure, a History and Physical                            was performed, and patient medications and                            allergies were reviewed. The patient's tolerance of                            previous anesthesia was also reviewed. The risks                            and benefits of the procedure and the sedation                            options and risks were discussed with the patient.                            All questions were answered, and informed consent                            was obtained. Prior Anticoagulants: The patient has                            taken Eliquis (apixaban), last dose was 2 days                            prior to procedure. ASA Grade Assessment: III - A                            patient with severe systemic disease. After                            reviewing the risks and benefits, the patient was                            deemed in satisfactory condition to undergo the  procedure.                           After obtaining informed consent, the endoscope was                            passed under direct vision. Throughout the                            procedure, the patient's blood pressure, pulse, and                            oxygen saturations were monitored continuously. The                            Endoscope was introduced through the mouth, and                             advanced to the second part of duodenum. The upper                            GI endoscopy was accomplished without difficulty.                            The patient tolerated the procedure well. Scope In: Scope Out: Findings:                 Grade II varices were found in the mid esophagus                            and in the distal esophagus. They were 5 mm in                            largest diameter.                           There were esophageal mucosal changes consistent                            with long-segment Barrett's esophagus present in                            the distal esophagus. The maximum longitudinal                            extent of these mucosal changes was 5 cm in length,                            from 34-39cm. Biopsies not obtained as the                            suspected Barrett's was overlying varices.                           The exam of the esophagus was  otherwise normal.                           A small hiatal hernia was present.                           Severe portal hypertensive gastropathy was found in                            the entire examined stomach.                           The exam of the stomach was otherwise normal.                           The duodenal bulb and second portion of the                            duodenum were normal. Complications:            No immediate complications. Estimated Blood Loss:     Estimated blood loss: none. Impression:               - Grade II esophageal varices.                           - Esophageal mucosal changes consistent with                            long-segment Barrett's esophagus.                           - Small hiatal hernia.                           - Portal hypertensive gastropathy.                           - Normal duodenal bulb and second portion of the                            duodenum.                           - No specimens collected. Recommendation:           -  Patient has a contact number available for                            emergencies. The signs and symptoms of potential                            delayed complications were discussed with the                            patient. Return to normal activities tomorrow.  Written discharge instructions were provided to the                            patient.                           - Resume previous diet.                           - Continue present medications.                           - Resume Eliquis (apixaban) at prior dose tomorrow.                            Refer to managing physician for further adjustment                            of therapy and to reassess risk of anticoagulation                            with esophageal varices, portal gastropathy.                           - Pantoprazole 40 mg po qd, refills for 1 year,                            take long term for GERD, Barrett's.                           - Follow up with Dr. Benay Spice and Dr. Percival Spanish. Ladene Artist, MD 03/19/2020 10:06:26 AM This report has been signed electronically.

## 2020-03-19 NOTE — Progress Notes (Signed)
PT taken to PACU. Monitors in place. VSS. Report given to RN. 

## 2020-03-21 ENCOUNTER — Telehealth: Payer: Self-pay

## 2020-03-21 ENCOUNTER — Telehealth: Payer: Self-pay | Admitting: *Deleted

## 2020-03-21 MED ORDER — FUROSEMIDE 20 MG PO TABS: 20.0000 mg | ORAL_TABLET | ORAL | 1 refills | Status: DC | PRN

## 2020-03-21 NOTE — Telephone Encounter (Signed)
Advised patient of lab results, sent Rx to [facility], and sent to PCP

## 2020-03-21 NOTE — Telephone Encounter (Signed)
-----   Message from Minus Breeding, MD sent at 02/28/2020  6:21 PM EDT ----- He does have a mildly elevated BNP.  If he is short of breath on a given day he could take 20 mg of Lasix but he would need to do this sparingly given his renal insufficiency.  Please prescribe 20 mg Lasix as needed daily.  He needs significant salt restriction if he is not already doing this.  Call Mr. Burich with the results and send results to Antony Contras, MD

## 2020-03-21 NOTE — Telephone Encounter (Signed)
First post procedure follow up call, no answer 

## 2020-03-21 NOTE — Telephone Encounter (Signed)
Patient returned the call stated he is well and had no questions at this time

## 2020-03-26 ENCOUNTER — Inpatient Hospital Stay (HOSPITAL_BASED_OUTPATIENT_CLINIC_OR_DEPARTMENT_OTHER): Payer: Medicare Other | Admitting: Nurse Practitioner

## 2020-03-26 ENCOUNTER — Other Ambulatory Visit: Payer: Self-pay

## 2020-03-26 ENCOUNTER — Ambulatory Visit: Payer: Medicare Other | Admitting: Nurse Practitioner

## 2020-03-26 VITALS — BP 108/73 | HR 75 | Temp 96.9°F | Resp 16 | Ht 68.0 in | Wt 222.1 lb

## 2020-03-26 DIAGNOSIS — Z7189 Other specified counseling: Secondary | ICD-10-CM | POA: Diagnosis not present

## 2020-03-26 DIAGNOSIS — R5383 Other fatigue: Secondary | ICD-10-CM | POA: Diagnosis not present

## 2020-03-26 DIAGNOSIS — C220 Liver cell carcinoma: Secondary | ICD-10-CM | POA: Insufficient documentation

## 2020-03-26 NOTE — Progress Notes (Addendum)
[place] OFFICE PROGRESS NOTE   Diagnosis: Hepatocellular carcinoma  INTERVAL HISTORY:   Dillon Perkins returns for follow-up.  He underwent Y 90 left hepatic lobe on 03/01/2020.  Initial plan was for Y 90 of the right liver on 03/28/2020.  Dr. Earleen Newport was concerned of potential toxicity with Y 90 to the right liver given the persistent liver enzyme elevation and advanced cirrhosis.  New plan is to hold on Y 90 to the right liver and obtain a restaging MRI approximately 6 to 8 weeks from the completion of the left liver Y 90.  Overall he feels well.  He has dyspnea on exertion, BNP mildly elevated and was recently prescribed Lasix by Dr. Percival Spanish.  No chest pain.  No bleeding.  He is not currently on anticoagulation.  Objective:  Vital signs in last 24 hours:  Blood pressure 108/73, pulse 75, temperature (!) 96.9 F (36.1 C), temperature source Tympanic, resp. rate 16, height 5\' 8"  (1.727 m), weight 222 lb 1.6 oz (100.7 kg), SpO2 100 %.    Resp: Lungs clear bilaterally. Cardio: Irregular. GI: No hepatomegaly. Vascular: No leg edema.    Lab Results:  Lab Results  Component Value Date   WBC 7.1 03/01/2020   HGB 12.0 (L) 03/01/2020   HCT 36.8 (L) 03/01/2020   MCV 108.2 (H) 03/01/2020   PLT 188 03/01/2020   NEUTROABS 5.1 03/01/2020    Imaging:  No results found.  Medications: I have reviewed the patient's current medications.  Assessment/Plan: 1. Hepatocellular carcinoma ? Multiple Li-Rad 5 liver lesions noted on a CT abdomen/pelvis 01/02/2020 including a 7.1 x 5.7 cm segment 2 lesion, there are right and left hepatic lesions ? CT abdomen/pelvis 01/02/2020-cirrhosis with varices, multiple LR 5 liver lesions, 1.1 cm fluid density lesion in the pancreas head, splenomegaly, bilateral kidney stones, small amount of right perihepatic ascites ? MRI liver 02/08/2020-cirrhosis, numerous hyperenhancing liver lesions, largest lesion in segment two measuring 7.9 x 6.1 cm,  lesions are consistent with multifocal hepatocellular carcinoma, the three largest lesions are categorized as LI-RADS5. 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 Y90 left hepatic lobe 03/01/2020 2. 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 Hypertension 4. BPH 5. Hyperlipidemia 6. History of heavy alcohol use 7. Remote history of tobacco use 8. Renal insufficiency 9. Microscopic hematuria 10. 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  Esophageal mucosal changes consistent with long segment Barrett's esophagus present in the distal esophagus.  Severe portal hypertensive gastropathy found in the entire examined stomach.  Small hiatal hernia. 11.  Mild right hydronephrosis with a probable calculus in the proximal third of the right ureter on the MRI 03/06/2020   Disposition: Dillon Perkins appears stable.  He completed Y 90 to the left liver 03/01/2020. Dr. Earleen Newport does not recommend Y 90 to the right liver given persistent liver enzyme elevation and advanced cirrhosis.  Dr. Benay Spice recommends initiating systemic therapy with nivolumab.  We reviewed potential toxicities including rash, diarrhea, immune mediated hepatitis, pneumonitis, endocrinopathies.  He agrees to proceed.  He will attend an education class.  He will return for baseline labs and cycle 1 nivolumab on 04/08/2020.  We will see him in follow-up that day.  He will contact the office in the interim with any problems.  Patient seen with Dr.  Sherrill.    Ned Card ANP/GNP-BC   03/26/2020  12:11 PM This was a shared visit with Ned Card.  Dillon Perkins has been diagnosed with multifocal hepatocellular carcinoma.  He has multiple comorbid conditions including advanced cirrhosis with portal  hypertension/varices, renal failure, and atrial fibrillation.  I discussed the case with Dr. Earleen Newport after Dillon Perkins last office visit here.  Dr. Earleen Newport is concerned of the possibility of liver toxicity with radioembolization of the right liver.  The plan is to proceed with systemic therapy.  We discussed systemic treatment options.  I recommend holding bevacizumab due to the potential for bleeding in the setting of esophageal varices and anticoagulation therapy.  We discussed immunotherapy and tyrosine kinase inhibitor options.  I recommend treatment with nivolumab.  We reviewed potential toxicities associated with nivolumab.  He agrees to proceed.  He will see Dr. Lovena Neighbours to evaluate the right hydronephrosis and possible ureter stone.  We will follow his clinical status, the AFP, and a repeat CT or MRI as markers of tumor response.  We will consider treatment with lenvatinib if there is no response to the nivolumab.  Julieanne Manson, MD

## 2020-03-27 ENCOUNTER — Telehealth: Payer: Self-pay | Admitting: Nurse Practitioner

## 2020-03-27 NOTE — Telephone Encounter (Signed)
Scheduled appointments per 9/21 los. Called patient, no answer. Left message for patient with appointment date and time.

## 2020-03-28 ENCOUNTER — Other Ambulatory Visit (HOSPITAL_COMMUNITY): Payer: Medicare Other

## 2020-03-28 ENCOUNTER — Ambulatory Visit (HOSPITAL_COMMUNITY): Payer: Medicare Other

## 2020-03-31 ENCOUNTER — Other Ambulatory Visit: Payer: Self-pay | Admitting: Oncology

## 2020-04-01 ENCOUNTER — Other Ambulatory Visit: Payer: Medicare Other

## 2020-04-01 DIAGNOSIS — N202 Calculus of kidney with calculus of ureter: Secondary | ICD-10-CM | POA: Diagnosis not present

## 2020-04-01 DIAGNOSIS — R311 Benign essential microscopic hematuria: Secondary | ICD-10-CM | POA: Diagnosis not present

## 2020-04-02 ENCOUNTER — Encounter: Payer: Self-pay | Admitting: Gastroenterology

## 2020-04-02 ENCOUNTER — Ambulatory Visit (INDEPENDENT_AMBULATORY_CARE_PROVIDER_SITE_OTHER): Payer: Medicare Other | Admitting: Gastroenterology

## 2020-04-02 VITALS — BP 118/64 | HR 91 | Ht 68.0 in | Wt 222.0 lb

## 2020-04-02 DIAGNOSIS — K2270 Barrett's esophagus without dysplasia: Secondary | ICD-10-CM | POA: Diagnosis not present

## 2020-04-02 DIAGNOSIS — I8500 Esophageal varices without bleeding: Secondary | ICD-10-CM

## 2020-04-02 DIAGNOSIS — K7030 Alcoholic cirrhosis of liver without ascites: Secondary | ICD-10-CM | POA: Diagnosis not present

## 2020-04-02 NOTE — Patient Instructions (Addendum)
If you are age 71 or older, your body mass index should be between 23-30. Your Body mass index is 33.75 kg/m. If this is out of the aforementioned range listed, please consider follow up with your Primary Care Provider.  If you are age 51 or younger, your body mass index should be between 19-25. Your Body mass index is 33.75 kg/m. If this is out of the aformentioned range listed, please consider follow up with your Primary Care Provider.   Thank you for choosing me and [facility].  Pricilla Riffle. Dagoberto Ligas., MD., Marval Regal

## 2020-04-02 NOTE — Progress Notes (Signed)
    History of Present Illness: This is a 69 year old male returning for follow-up of cirrhosis with esophageal varices and HCC.  Hepatic serologic evaluation showed a minimally positive ASMA, ANA and ACE level however these results are not likely clinically significant.  Etiology of his liver disease is likely alcohol.  He has no gastrointestinal complaints.  EGD 03/19/2020 - Grade II esophageal varices. - Esophageal mucosal changes consistent with long-segment Barrett's esophagus. - Small hiatal hernia. - Portal hypertensive gastropathy. - Normal duodenal bulb and second portion of the duodenum. - No specimens collected.  Current Medications, Allergies, Past Medical History, Past Surgical History, Family History and Social History were reviewed in [employer] record.   Physical Exam: General: Well developed, well nourished, no acute distress Head: Normocephalic and atraumatic Eyes:  sclerae anicteric, EOMI Ears: Normal auditory acuity Mouth: Not examined, mask on during Covid-19 pandemic Lungs: Clear throughout to auscultation Heart: Regular rate and rhythm; no murmurs, rubs or bruits Abdomen: Soft, non tender and non distended. No masses, hepatosplenomegaly or hernias noted. Normal Bowel sounds Rectal: Not done Musculoskeletal: Symmetrical with no gross deformities  Pulses:  Normal pulses noted Extremities: No clubbing, cyanosis, edema or deformities noted Neurological: Alert oriented x 4, grossly nonfocal Psychological:  Alert and cooperative. Normal mood and affect   Assessment and Recommendations:  1.  Cirrhosis with grade 2 esophageal varices, portal gastropathy with HCC.  Patient is no longer taking Eliquis due to cost.  He would be at a higher risk for GI bleeding from varices or portal gastropathy with anticoagulation. I did not recommend him to stop anticoagulants however  I did advise him of his increased risk of GI bleeding.  I will ask Dr.  Percival Spanish if it is reasonable to change to a nonselective beta-blocker to reduce the risk of variceal bleeding (propranolol or nadolol) in place of metoprolol. REV in 6 months.   2.  [place].  He completed Y 90 to the left liver.  Dr. Earleen Newport did not recommend Y 90 to the right liver given LFT elevations and cirrhosis.  Dr. Benay Spice has discussed with him nivolumab therapy.   3. GERD with suspected Barrett's esophagus, 5 cm in length. Esophageal biopsies were not performed due to underlying esophageal varices.  Follow antireflux measures.  Maintain pantoprazole 40 mg po daily long-term.  4. Afib. Currently not on anticoagulation. I sent a message to Dr. Percival Spanish to inform him and get his input.

## 2020-04-03 ENCOUNTER — Inpatient Hospital Stay: Payer: Medicare Other

## 2020-04-03 ENCOUNTER — Other Ambulatory Visit: Payer: Self-pay

## 2020-04-03 ENCOUNTER — Encounter: Payer: Self-pay | Admitting: Oncology

## 2020-04-03 NOTE — Progress Notes (Signed)
Met with patient at registration to introduce myself as Arboriculturist and to offer available resources.  Discussed one-time $1000 Radio broadcast assistant to assist with personal expenses while going through treatment. He said that wasn't going to work.  Gave him my card if interested in applying and for any additional financial questions or concerns.

## 2020-04-04 ENCOUNTER — Telehealth: Payer: Self-pay | Admitting: Cardiology

## 2020-04-04 NOTE — Telephone Encounter (Signed)
   Primary Cardiologist: Minus Breeding, MD  Chart reviewed as part of pre-operative protocol coverage. Patient was contacted 04/04/2020 in reference to pre-operative risk assessment for pending surgery as outlined below.  Dillon Perkins was last seen on 02/27/20 by Dr. Percival Spanish with history outlined of permanent atrial fibrillation, hepatocellular carcinoma, HTN, HLD, CKD stage III. He has history of known SOB with prior echo in 11/2018 that showed EF 55-60%, moderately increased LV wall thickness. Prior BNP mildly elevated, given rx for PRN Lasix to take sparingly. No prior ischemic testing noted  I reached out to patient to inquire about symptoms. May need to involve MD given prior DOE if he is still experiencing this. Per last note, patient is not actually on Eliquis at this time so will need to clarify.LMTCB.   [name], PA-C 04/04/2020, 3:44 PM

## 2020-04-04 NOTE — Telephone Encounter (Signed)
   [facility] Pre-operative Risk Assessment    HEARTCARE STAFF: - Please ensure there is not already an duplicate clearance open for this procedure. - Under Visit Info/Reason for Call, type in Other and utilize the format Clearance MM/DD/YY or Clearance TBD. Do not use dashes or single digits. - If request is for dental extraction, please clarify the # of teeth to be extracted.  Request for surgical clearance:  1. What type of surgery is being performed? Cystoscopy, Ureteroscopy, Laser Lithotripsy and stent placement    2. When is this surgery scheduled? TBD based on clearance   3. What type of clearance is required (medical clearance vs. Pharmacy clearance to hold med vs. Both)? Both  4. Are there any medications that need to be held prior to surgery and how long? Eliquis 2-3 days prior   5. Practice name and name of physician performing surgery? Dr. Ellison Hughs, [facility]  6. What is the office phone number? [phone]   7.   What is the office fax number? [phone]   8.   Anesthesia type (None, local, MAC, general) ? general   Dillon Perkins 04/04/2020, 3:23 PM  _________________________________________________________________   (provider comments below)

## 2020-04-05 ENCOUNTER — Telehealth: Payer: Self-pay

## 2020-04-05 MED ORDER — PROPRANOLOL HCL ER 80 MG PO CP24: 80.0000 mg | ORAL_CAPSULE | Freq: Every day | ORAL | 6 refills | Status: DC

## 2020-04-05 NOTE — Telephone Encounter (Signed)
Patient is returning call.  °

## 2020-04-05 NOTE — Telephone Encounter (Signed)
Patient returning call.

## 2020-04-05 NOTE — Telephone Encounter (Signed)
Patient notified of the new recommendations. Rx sent.  He verbalized understanding to call for HR out ot the range.

## 2020-04-05 NOTE — Telephone Encounter (Signed)
   Primary Cardiologist: Minus Breeding, MD  Chart reviewed as part of pre-operative protocol coverage. Given past medical history and time since last visit, based on ACC/AHA guidelines, Dillon Perkins would be at acceptable risk for the planned procedure without further cardiovascular testing.   Patient is not taking any blood thinning medications at this time.  Patient was advised that if he develops new symptoms prior to surgery to contact our office to arrange a follow-up appointment.  He verbalized understanding.  I will route this recommendation to the requesting party via Epic fax function and remove from pre-op pool.  Please call with questions.  Dillon Perkins. Dillon Montesinos NP-C    04/05/2020, 10:57 AM [place] [address] Office [phone] Fax [phone]

## 2020-04-05 NOTE — Telephone Encounter (Signed)
-----   Message from Ladene Artist, MD sent at 04/05/2020  9:57 AM EDT ----- Barbera Setters,   See 9/28 office note. Changing to a beta blocker to more effectively lower portal pressures and reduce the risk of UGI bleeding. Please let the patient know that Dr. Percival Spanish has advised on this medication change. Please ask patient to stop metoprolol/Toprol-XL and start Inderal XL 80 mg qd, provide enough refills for 6 months. Please ask him to monitor his resting HR twice a day for 2 weeks. If below 60 or above 95 please call.   MS ----- Message ----- From: Minus Breeding, MD Sent: 04/04/2020   8:54 PM EDT To: Ladene Artist, MD  We could try Inderal XL 80 mg daily po daily and he needs to follow his heart rate.   Jake ----- Message ----- From: Ladene Artist, MD Sent: 04/02/2020  12:36 PM EDT To: Minus Breeding, MD  Clearnce Sorrel,    From your perspective would it be ok to change to a nonselective beta-blocker in place of metoprolol to reduce the risk of variceal bleeding (propranolol or nadolol)? If ok what dose of propranolol or nadolol would you recommend to adequately replace metoprolol XL 100 mg?   He is not currently taking Eliquis for his afib due to cost. He is currently not on an anticoagulant and you may want to review this with him. I did not recommend that he stop anticoagulants however he has an increased risk of bleeding with esophageal varices and portal gastropathy.   Thanks,   Norberto Sorenson

## 2020-04-08 ENCOUNTER — Other Ambulatory Visit: Payer: Self-pay

## 2020-04-08 ENCOUNTER — Inpatient Hospital Stay: Payer: Medicare Other

## 2020-04-08 ENCOUNTER — Inpatient Hospital Stay: Payer: Medicare Other | Attending: Oncology | Admitting: Oncology

## 2020-04-08 ENCOUNTER — Telehealth: Payer: Self-pay | Admitting: Oncology

## 2020-04-08 VITALS — BP 95/55 | HR 72 | Temp 97.3°F | Resp 18 | Ht 68.0 in | Wt 221.0 lb

## 2020-04-08 DIAGNOSIS — Z5112 Encounter for antineoplastic immunotherapy: Secondary | ICD-10-CM | POA: Diagnosis not present

## 2020-04-08 DIAGNOSIS — C220 Liver cell carcinoma: Secondary | ICD-10-CM

## 2020-04-08 DIAGNOSIS — R5383 Other fatigue: Secondary | ICD-10-CM

## 2020-04-08 DIAGNOSIS — Z23 Encounter for immunization: Secondary | ICD-10-CM

## 2020-04-08 DIAGNOSIS — Z79899 Other long term (current) drug therapy: Secondary | ICD-10-CM | POA: Diagnosis not present

## 2020-04-08 LAB — CBC WITH DIFFERENTIAL (CANCER CENTER ONLY)
Abs Immature Granulocytes: 0.02 10*3/uL (ref 0.00–0.07)
Basophils Absolute: 0 10*3/uL (ref 0.0–0.1)
Basophils Relative: 1 %
Eosinophils Absolute: 0.2 10*3/uL (ref 0.0–0.5)
Eosinophils Relative: 5 %
HCT: 31.1 % — ABNORMAL LOW (ref 39.0–52.0)
Hemoglobin: 10.6 g/dL — ABNORMAL LOW (ref 13.0–17.0)
Immature Granulocytes: 0 %
Lymphocytes Relative: 20 %
Lymphs Abs: 0.9 10*3/uL (ref 0.7–4.0)
MCH: 35.9 pg — ABNORMAL HIGH (ref 26.0–34.0)
MCHC: 34.1 g/dL (ref 30.0–36.0)
MCV: 105.4 fL — ABNORMAL HIGH (ref 80.0–100.0)
Monocytes Absolute: 0.6 10*3/uL (ref 0.1–1.0)
Monocytes Relative: 12 %
Neutro Abs: 2.8 10*3/uL (ref 1.7–7.7)
Neutrophils Relative %: 62 %
Platelet Count: 110 10*3/uL — ABNORMAL LOW (ref 150–400)
RBC: 2.95 MIL/uL — ABNORMAL LOW (ref 4.22–5.81)
RDW: 15 % (ref 11.5–15.5)
WBC Count: 4.5 10*3/uL (ref 4.0–10.5)
nRBC: 0 % (ref 0.0–0.2)

## 2020-04-08 LAB — CMP (CANCER CENTER ONLY)
ALT: 29 U/L (ref 0–44)
AST: 47 U/L — ABNORMAL HIGH (ref 15–41)
Albumin: 2.6 g/dL — ABNORMAL LOW (ref 3.5–5.0)
Alkaline Phosphatase: 143 U/L — ABNORMAL HIGH (ref 38–126)
Anion gap: 7 (ref 5–15)
BUN: 35 mg/dL — ABNORMAL HIGH (ref 8–23)
CO2: 18 mmol/L — ABNORMAL LOW (ref 22–32)
Calcium: 8.7 mg/dL — ABNORMAL LOW (ref 8.9–10.3)
Chloride: 115 mmol/L — ABNORMAL HIGH (ref 98–111)
Creatinine: 2.03 mg/dL — ABNORMAL HIGH (ref 0.61–1.24)
GFR, Est AFR Am: 37 mL/min — ABNORMAL LOW (ref >60)
GFR, Estimated: 32 mL/min — ABNORMAL LOW (ref >60)
Glucose, Bld: 116 mg/dL — ABNORMAL HIGH (ref 70–99)
Potassium: 4 mmol/L (ref 3.5–5.1)
Sodium: 140 mmol/L (ref 135–145)
Total Bilirubin: 1.9 mg/dL — ABNORMAL HIGH (ref 0.3–1.2)
Total Protein: 6.6 g/dL (ref 6.5–8.1)

## 2020-04-08 LAB — TSH: TSH: 2.382 u[IU]/mL (ref 0.320–4.118)

## 2020-04-08 MED ORDER — SODIUM CHLORIDE 0.9 % IV SOLN
Freq: Once | INTRAVENOUS | Status: AC
  Filled 2020-04-08: qty 250

## 2020-04-08 MED ORDER — SODIUM CHLORIDE 0.9 % IV SOLN
240.0000 mg | Freq: Once | INTRAVENOUS | Status: AC
  Administered 2020-04-08: 240 mg via INTRAVENOUS
  Filled 2020-04-08: qty 24

## 2020-04-08 NOTE — Telephone Encounter (Signed)
Scheduled per 10/4 los. Printed appt calendar for pt.

## 2020-04-08 NOTE — Patient Instructions (Signed)
[facility] Discharge Instructions for Patients Receiving Chemotherapy  Today you received the following chemotherapy agents Nivolumab  To help prevent nausea and vomiting after your treatment, we encourage you to take your nausea medication as directed.  If you develop nausea and vomiting that is not controlled by your nausea medication, call the clinic.   BELOW ARE SYMPTOMS THAT SHOULD BE REPORTED IMMEDIATELY:  *FEVER GREATER THAN 100.5 F  *CHILLS WITH OR WITHOUT FEVER  NAUSEA AND VOMITING THAT IS NOT CONTROLLED WITH YOUR NAUSEA MEDICATION  *UNUSUAL SHORTNESS OF BREATH  *UNUSUAL BRUISING OR BLEEDING  TENDERNESS IN MOUTH AND THROAT WITH OR WITHOUT PRESENCE OF ULCERS  *URINARY PROBLEMS  *BOWEL PROBLEMS  UNUSUAL RASH Items with * indicate a potential emergency and should be followed up as soon as possible.  Feel free to call the clinic should you have any questions or concerns. The clinic phone number is [phone].  Please show the CHEMO ALERT CARD at check-in to the Emergency Department and triage nurse.  Nivolumab injection What is this medicine? NIVOLUMAB (nye VOL ue mab) is a monoclonal antibody. It is used to treat colon cancer, esophageal cancer, head and neck cancer, Hodgkin lymphoma, kidney cancer, liver cancer, lung cancer, mesothelioma, melanoma, and urothelial cancer. This medicine may be used for other purposes; ask your health care provider or pharmacist if you have questions. COMMON BRAND NAME(S): Opdivo What should I tell my health care provider before I take this medicine? They need to know if you have any of these conditions:  diabetes  immune system problems  kidney disease  liver disease  lung disease  organ transplant  stomach or intestine problems  thyroid disease  an unusual or allergic reaction to nivolumab, other medicines, foods, dyes, or preservatives  pregnant or trying to get pregnant  breast-feeding How  should I use this medicine? This medicine is for infusion into a vein. It is given by a health care professional in a hospital or clinic setting. A special MedGuide will be given to you before each treatment. Be sure to read this information carefully each time. Talk to your pediatrician regarding the use of this medicine in children. While this drug may be prescribed for children as young as 12 years for selected conditions, precautions do apply. Overdosage: If you think you have taken too much of this medicine contact a poison control center or emergency room at once. NOTE: This medicine is only for you. Do not share this medicine with others. What if I miss a dose? It is important not to miss your dose. Call your doctor or health care professional if you are unable to keep an appointment. What may interact with this medicine? Interactions have not been studied. Give your health care provider a list of all the medicines, herbs, non-prescription drugs, or dietary supplements you use. Also tell them if you smoke, drink alcohol, or use illegal drugs. Some items may interact with your medicine. This list may not describe all possible interactions. Give your health care provider a list of all the medicines, herbs, non-prescription drugs, or dietary supplements you use. Also tell them if you smoke, drink alcohol, or use illegal drugs. Some items may interact with your medicine. What should I watch for while using this medicine? This drug may make you feel generally unwell. Continue your course of treatment even though you feel ill unless your doctor tells you to stop. You may need blood work done while you are taking this medicine. Do   not become pregnant while taking this medicine or for 5 months after stopping it. Women should inform their doctor if they wish to become pregnant or think they might be pregnant. There is a potential for serious side effects to an unborn child. Talk to your health care  professional or pharmacist for more information. Do not breast-feed an infant while taking this medicine or for 5 months after stopping it. What side effects may I notice from receiving this medicine? Side effects that you should report to your doctor or health care professional as soon as possible:  allergic reactions like skin rash, itching or hives, swelling of the face, lips, or tongue  breathing problems  blood in the urine  bloody or watery diarrhea or black, tarry stools  changes in emotions or moods  changes in vision  chest pain  cough  dizziness  feeling faint or lightheaded, falls  fever, chills  headache with fever, neck stiffness, confusion, loss of memory, sensitivity to light, hallucination, loss of contact with reality, or seizures  joint pain  mouth sores  redness, blistering, peeling or loosening of the skin, including inside the mouth  severe muscle pain or weakness  signs and symptoms of high blood sugar such as dizziness; dry mouth; dry skin; fruity breath; nausea; stomach pain; increased hunger or thirst; increased urination  signs and symptoms of kidney injury like trouble passing urine or change in the amount of urine  signs and symptoms of liver injury like dark yellow or brown urine; general ill feeling or flu-like symptoms; light-colored stools; loss of appetite; nausea; right upper belly pain; unusually weak or tired; yellowing of the eyes or skin  swelling of the ankles, feet, hands  trouble passing urine or change in the amount of urine  unusually weak or tired  weight gain or loss Side effects that usually do not require medical attention (report to your doctor or health care professional if they continue or are bothersome):  bone pain  constipation  decreased appetite  diarrhea  muscle pain  nausea, vomiting  tiredness This list may not describe all possible side effects. Call your doctor for medical advice about side  effects. You may report side effects to FDA at 1-800-FDA-1088. Where should I keep my medicine? This drug is given in a hospital or clinic and will not be stored at home. NOTE: This sheet is a summary. It may not cover all possible information. If you have questions about this medicine, talk to your doctor, pharmacist, or health care provider.  2020 Elsevier/Gold Standard (2019-04-11 10:04:50)  

## 2020-04-08 NOTE — Progress Notes (Signed)
Per Dr. Benay Spice: OK to treat w/elevated creatinine and liver functions.

## 2020-04-08 NOTE — Progress Notes (Signed)
[facility] OFFICE PROGRESS NOTE   Diagnosis: Hepatocellular carcinoma  INTERVAL HISTORY:   [name] returns as scheduled.  No new complaint.  He underwent an upper endoscopy on 03/19/2020.  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  He was also noted to have changes of Barrett's esophagus.  There are changes of severe portal hypertensive gastropathy.  He saw Dillon Perkins and was confirmed to have bilateral kidney stones.  He is being scheduled for laser lithotripsy.  Objective:  Vital signs in last 24 hours:  Blood pressure (!) 95/55, pulse 72, temperature (!) 97.3 F (36.3 C), temperature source Tympanic, resp. rate 18, height 5\' 8"  (1.727 m), weight 221 lb (100.2 kg), SpO2 100 %.    Resp: Lungs clear bilaterally Cardio: Regular rate and rhythm GI: No hepatomegaly, nontender, no apparent ascites Vascular: No leg edema    Portacath/PICC-without erythema  Lab Results:  Lab Results  Component Value Date   WBC 4.5 04/08/2020   HGB 10.6 (L) 04/08/2020   HCT 31.1 (L) 04/08/2020   MCV 105.4 (H) 04/08/2020   PLT 110 (L) 04/08/2020   NEUTROABS 2.8 04/08/2020    CMP  Lab Results  Component Value Date   NA 138 03/13/2020   K 4.4 03/13/2020   CL 111 03/13/2020   CO2 20 (L) 03/13/2020   GLUCOSE 102 (H) 03/13/2020   BUN 37 (H) 03/13/2020   CREATININE 1.76 (H) 03/13/2020   CALCIUM 8.5 (L) 03/13/2020   PROT 7.0 03/13/2020   ALBUMIN 2.6 (L) 03/13/2020   AST 47 (H) 03/13/2020   ALT 30 03/13/2020   ALKPHOS 148 (H) 03/13/2020   BILITOT 2.0 (H) 03/13/2020   GFRNONAA 38 (L) 03/13/2020   GFRAA 44 (L) 03/13/2020    Medications: I have reviewed the patient's current medications.   Assessment/Plan:  1. Hepatocellular carcinoma ? Multiple Li-Rad 5 liver lesions noted on a CT abdomen/pelvis 01/02/2020 including a 7.1 x 5.7 cm segment 2 lesion, there are right and left hepatic lesions ? CT abdomen/pelvis 01/02/2020-cirrhosis with varices, multiple LR 5 liver lesions,  1.1 cm fluid density lesion in the pancreas head, splenomegaly, bilateral kidney stones, small amount of right perihepatic ascites ? MRI liver 02/08/2020-cirrhosis, numerous hyperenhancing liver lesions, largest lesion in segment two measuring 7.9 x 6.1 cm, lesions are consistent with multifocal hepatocellular carcinoma, the three largest lesions are categorized as LI-RADS5. 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 Y90 left hepatic lobe 03/01/2020 ? Cycle 1 nivolumab 04/08/2020 2. 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 Hypertension 4. BPH 5. Hyperlipidemia 6. History of heavy alcohol use 7. Remote history of tobacco use 8. Renal insufficiency 9. Microscopic hematuria 10. 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  Esophageal mucosal changes consistent with long segment Barrett's esophagus present in the distal esophagus.  Severe portal hypertensive gastropathy found in the entire examined stomach.  Small hiatal hernia. 11.  Mild right hydronephrosis with a probable calculus in the proximal third of the right ureter on the MRI 03/06/2020, renal ultrasound 04/01/2020-multiple nonobstructing bilateral renal calculi    Disposition: [name] appears stable.  The plan is to begin nivolumab today.  We reviewed potential toxicities associated with nivolumab.  He agrees to proceed.  He will complete 2 treatments with nivolumab prior to a trip to  [place].  He received a COVID-19 booster vaccine today.  [name] will follow up with Dillon Perkins for management of the bilateral renal stones.  He will return for an office visit and nivolumab in 2 weeks.  Betsy Coder, MD  04/08/2020  8:45 AM

## 2020-04-08 NOTE — Progress Notes (Signed)
   Covid-19 Vaccination Clinic  Name:  Dillon Perkins    MRN: [id number] DOB: 1950/09/10  04/08/2020  Mr. Forgette was observed post Covid-19 immunization for 15 minutes without incident. He was provided with Vaccine Information Sheet and instruction to access the V-Safe system.   Mr. Luckett was instructed to call 911 with any severe reactions post vaccine: Marland Kitchen Difficulty breathing  . Swelling of face and throat  . A fast heartbeat  . A bad rash all over body  . Dizziness and weakness

## 2020-04-09 LAB — AFP TUMOR MARKER: AFP, Serum, Tumor Marker: 10.4 ng/mL — ABNORMAL HIGH (ref 0.0–8.3)

## 2020-04-15 ENCOUNTER — Other Ambulatory Visit: Payer: Self-pay | Admitting: Urology

## 2020-04-19 ENCOUNTER — Other Ambulatory Visit: Payer: Self-pay | Admitting: Oncology

## 2020-04-22 ENCOUNTER — Inpatient Hospital Stay (HOSPITAL_BASED_OUTPATIENT_CLINIC_OR_DEPARTMENT_OTHER): Payer: Medicare Other | Admitting: Nurse Practitioner

## 2020-04-22 ENCOUNTER — Encounter: Payer: Self-pay | Admitting: Nurse Practitioner

## 2020-04-22 ENCOUNTER — Other Ambulatory Visit: Payer: Self-pay

## 2020-04-22 ENCOUNTER — Inpatient Hospital Stay: Payer: Medicare Other

## 2020-04-22 VITALS — BP 121/83 | HR 86 | Temp 97.8°F | Resp 18 | Ht 68.0 in | Wt 221.2 lb

## 2020-04-22 DIAGNOSIS — Z79899 Other long term (current) drug therapy: Secondary | ICD-10-CM | POA: Diagnosis not present

## 2020-04-22 DIAGNOSIS — C220 Liver cell carcinoma: Secondary | ICD-10-CM

## 2020-04-22 DIAGNOSIS — Z5112 Encounter for antineoplastic immunotherapy: Secondary | ICD-10-CM | POA: Diagnosis not present

## 2020-04-22 LAB — CBC WITH DIFFERENTIAL (CANCER CENTER ONLY)
Abs Immature Granulocytes: 0.01 10*3/uL (ref 0.00–0.07)
Basophils Absolute: 0.1 10*3/uL (ref 0.0–0.1)
Basophils Relative: 1 %
Eosinophils Absolute: 0.3 10*3/uL (ref 0.0–0.5)
Eosinophils Relative: 6 %
HCT: 35.4 % — ABNORMAL LOW (ref 39.0–52.0)
Hemoglobin: 12 g/dL — ABNORMAL LOW (ref 13.0–17.0)
Immature Granulocytes: 0 %
Lymphocytes Relative: 21 %
Lymphs Abs: 1.2 10*3/uL (ref 0.7–4.0)
MCH: 34.8 pg — ABNORMAL HIGH (ref 26.0–34.0)
MCHC: 33.9 g/dL (ref 30.0–36.0)
MCV: 102.6 fL — ABNORMAL HIGH (ref 80.0–100.0)
Monocytes Absolute: 0.7 10*3/uL (ref 0.1–1.0)
Monocytes Relative: 12 %
Neutro Abs: 3.5 10*3/uL (ref 1.7–7.7)
Neutrophils Relative %: 60 %
Platelet Count: 111 10*3/uL — ABNORMAL LOW (ref 150–400)
RBC: 3.45 MIL/uL — ABNORMAL LOW (ref 4.22–5.81)
RDW: 14.9 % (ref 11.5–15.5)
WBC Count: 5.9 10*3/uL (ref 4.0–10.5)
nRBC: 0 % (ref 0.0–0.2)

## 2020-04-22 LAB — CMP (CANCER CENTER ONLY)
ALT: 30 U/L (ref 0–44)
AST: 46 U/L — ABNORMAL HIGH (ref 15–41)
Albumin: 2.8 g/dL — ABNORMAL LOW (ref 3.5–5.0)
Alkaline Phosphatase: 151 U/L — ABNORMAL HIGH (ref 38–126)
Anion gap: 5 (ref 5–15)
BUN: 30 mg/dL — ABNORMAL HIGH (ref 8–23)
CO2: 25 mmol/L (ref 22–32)
Calcium: 8.9 mg/dL (ref 8.9–10.3)
Chloride: 110 mmol/L (ref 98–111)
Creatinine: 1.87 mg/dL — ABNORMAL HIGH (ref 0.61–1.24)
GFR, Estimated: 35 mL/min — ABNORMAL LOW (ref >60)
Glucose, Bld: 108 mg/dL — ABNORMAL HIGH (ref 70–99)
Potassium: 3.9 mmol/L (ref 3.5–5.1)
Sodium: 140 mmol/L (ref 135–145)
Total Bilirubin: 2.8 mg/dL — ABNORMAL HIGH (ref 0.3–1.2)
Total Protein: 7.4 g/dL (ref 6.5–8.1)

## 2020-04-22 MED ORDER — SODIUM CHLORIDE 0.9 % IV SOLN
240.0000 mg | Freq: Once | INTRAVENOUS | Status: AC
  Administered 2020-04-22: 240 mg via INTRAVENOUS
  Filled 2020-04-22: qty 24

## 2020-04-22 MED ORDER — SODIUM CHLORIDE 0.9 % IV SOLN
Freq: Once | INTRAVENOUS | Status: AC
  Filled 2020-04-22: qty 250

## 2020-04-22 NOTE — Progress Notes (Addendum)
[name]   Diagnosis: Hepatocellular carcinoma  INTERVAL HISTORY:   Dillon Perkins returns as scheduled.  No diarrhea or rash.  No nausea or vomiting.  He is more fatigued.  He and his wife report audible wheezing intermittently over the past 2 weeks.  He has never had wheezing before.  No allergy type symptoms.  He has stable dyspnea on exertion.  No cough or fever.  Objective:  Vital signs in last 24 hours:  Blood pressure 121/83, pulse 86, temperature 97.8 F (36.6 C), temperature source Tympanic, resp. rate 18, height 5\' 8"  (1.727 m), weight 221 lb 3.2 oz (100.3 kg), SpO2 100 %.    HEENT: No thrush or ulcers. Resp: Lungs clear bilaterally.  No wheezes.  No respiratory distress. Cardio: Regular rate and rhythm. GI: Abdomen soft and nontender.  No hepatomegaly. Vascular: No leg edema. Skin: No rash.   Lab Results:  Lab Results  Component Value Date   WBC 5.9 04/22/2020   HGB 12.0 (L) 04/22/2020   HCT 35.4 (L) 04/22/2020   MCV 102.6 (H) 04/22/2020   PLT 111 (L) 04/22/2020   NEUTROABS 3.5 04/22/2020    Imaging:  No results found.  Medications: I have reviewed the patient's current medications.  Assessment/Plan: 1. Hepatocellular carcinoma ? Multiple Li-Rad 5 liver lesions noted on a CT abdomen/pelvis 01/02/2020 including a 7.1 x 5.7 cm segment 2 lesion, there are right and left hepatic lesions ? CT abdomen/pelvis 01/02/2020-cirrhosis with varices, multiple LR 5 liver lesions, 1.1 cm fluid density lesion in the pancreas head, splenomegaly, bilateral kidney stones, small amount of right perihepatic ascites ? MRI liver 02/08/2020-cirrhosis, numerous hyperenhancing liver lesions, largest lesion in segment two measuring 7.9 x 6.1 cm, lesions are consistent with multifocal hepatocellular carcinoma, the three largest lesions are categorized as LI-RADS5. 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 Y90 left hepatic lobe 03/01/2020 ? Cycle 1 nivolumab 04/08/2020 ? Cycle 2 nivolumab 04/22/2020 2. 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 Hypertension 4. BPH 5. Hyperlipidemia 6. History of heavy alcohol use 7. Remote history of tobacco use 8. Renal insufficiency 9. Microscopic hematuria 10. 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  Esophageal mucosal changes consistent with long segment Barrett's esophagus present in the distal esophagus.  Severe portal hypertensive gastropathy found in the entire examined stomach.  Small hiatal hernia. 11.  Mild right hydronephrosis with a probable calculus in the proximal third of the right ureter on the MRI 03/06/2020, renal ultrasound 04/01/2020-multiple nonobstructing bilateral renal calculi  Disposition: Mr. Hoen appears unchanged.  He has completed 1 cycle of nivolumab.  Plan to proceed with cycle 2 today as scheduled.  We reviewed the CBC and chemistry panel from today.  Labs adequate to proceed with treatment.  Creatinine was stable elevation.  Bilirubin slightly more elevated.  We will continue to monitor.  At today's visit he reports recent wheezing.  He has no wheezing on exam today.  He will contact the office if this persists or he develops any other concerning symptoms such as fever, cough, shortness of breath.  He will return for lab, follow-up, nivolumab in 3 weeks rather than 2 due to a vacation.  He will contact the office in the interim as outlined above or with any other problems.  Patient seen with Dr. Benay Spice.    Ned Card ANP/GNP-BC   04/22/2020  10:04 AM  This was a shared visit with Ned Card.  Dillon Perkins was interviewed and examined.  He tolerated the first treatment with nivolumab well.  The etiology of his  wheezing is unclear.  He did not have wheezing on exam today.  I doubt this is related to nivolumab.  He will call for a cough or dyspnea.  He will complete cycle 2 nivolumab today.  Julieanne Manson, MD

## 2020-04-22 NOTE — Patient Instructions (Signed)
[facility] Discharge Instructions for Patients Receiving Chemotherapy  Today you received the following chemotherapy agents: nivolumab.  To help prevent nausea and vomiting after your treatment, we encourage you to take your nausea medication as directed.   If you develop nausea and vomiting that is not controlled by your nausea medication, call the clinic.   BELOW ARE SYMPTOMS THAT SHOULD BE REPORTED IMMEDIATELY:  *FEVER GREATER THAN 100.5 F  *CHILLS WITH OR WITHOUT FEVER  NAUSEA AND VOMITING THAT IS NOT CONTROLLED WITH YOUR NAUSEA MEDICATION  *UNUSUAL SHORTNESS OF BREATH  *UNUSUAL BRUISING OR BLEEDING  TENDERNESS IN MOUTH AND THROAT WITH OR WITHOUT PRESENCE OF ULCERS  *URINARY PROBLEMS  *BOWEL PROBLEMS  UNUSUAL RASH Items with * indicate a potential emergency and should be followed up as soon as possible.  Feel free to call the clinic should you have any questions or concerns. The clinic phone number is [phone].  Please show the CHEMO ALERT CARD at check-in to the Emergency Department and triage nurse.   

## 2020-05-04 ENCOUNTER — Other Ambulatory Visit: Payer: Self-pay | Admitting: Cardiology

## 2020-05-06 ENCOUNTER — Ambulatory Visit: Payer: Medicare Other | Admitting: Oncology

## 2020-05-06 ENCOUNTER — Other Ambulatory Visit: Payer: Medicare Other

## 2020-05-06 ENCOUNTER — Other Ambulatory Visit: Payer: Self-pay | Admitting: *Deleted

## 2020-05-06 ENCOUNTER — Ambulatory Visit: Payer: Medicare Other

## 2020-05-08 NOTE — Progress Notes (Signed)
Called and spoke with Dillon Perkins, Dillon Perkins to call pt and give our number for covid scheduling

## 2020-05-10 ENCOUNTER — Telehealth: Payer: Self-pay | Admitting: Oncology

## 2020-05-10 NOTE — Telephone Encounter (Signed)
Scheduled per los, called patient regarding upcoming appointments. 

## 2020-05-12 ENCOUNTER — Other Ambulatory Visit: Payer: Self-pay | Admitting: Oncology

## 2020-05-13 ENCOUNTER — Inpatient Hospital Stay: Payer: Medicare Other | Attending: Oncology

## 2020-05-13 ENCOUNTER — Other Ambulatory Visit: Payer: Self-pay

## 2020-05-13 ENCOUNTER — Inpatient Hospital Stay: Payer: Medicare Other

## 2020-05-13 ENCOUNTER — Telehealth: Payer: Self-pay

## 2020-05-13 ENCOUNTER — Inpatient Hospital Stay (HOSPITAL_BASED_OUTPATIENT_CLINIC_OR_DEPARTMENT_OTHER): Payer: Medicare Other | Admitting: Oncology

## 2020-05-13 VITALS — BP 95/68 | HR 74 | Temp 97.2°F | Resp 20 | Ht 68.0 in | Wt 230.2 lb

## 2020-05-13 DIAGNOSIS — C220 Liver cell carcinoma: Secondary | ICD-10-CM | POA: Diagnosis not present

## 2020-05-13 DIAGNOSIS — K7030 Alcoholic cirrhosis of liver without ascites: Secondary | ICD-10-CM

## 2020-05-13 DIAGNOSIS — Z5112 Encounter for antineoplastic immunotherapy: Secondary | ICD-10-CM | POA: Insufficient documentation

## 2020-05-13 DIAGNOSIS — Z79899 Other long term (current) drug therapy: Secondary | ICD-10-CM | POA: Insufficient documentation

## 2020-05-13 DIAGNOSIS — R5383 Other fatigue: Secondary | ICD-10-CM

## 2020-05-13 LAB — CMP (CANCER CENTER ONLY)
ALT: 27 U/L (ref 0–44)
AST: 46 U/L — ABNORMAL HIGH (ref 15–41)
Albumin: 2.6 g/dL — ABNORMAL LOW (ref 3.5–5.0)
Alkaline Phosphatase: 119 U/L (ref 38–126)
Anion gap: 7 (ref 5–15)
BUN: 38 mg/dL — ABNORMAL HIGH (ref 8–23)
CO2: 23 mmol/L (ref 22–32)
Calcium: 8.3 mg/dL — ABNORMAL LOW (ref 8.9–10.3)
Chloride: 111 mmol/L (ref 98–111)
Creatinine: 2.15 mg/dL — ABNORMAL HIGH (ref 0.61–1.24)
GFR, Estimated: 32 mL/min — ABNORMAL LOW (ref >60)
Glucose, Bld: 97 mg/dL (ref 70–99)
Potassium: 3.4 mmol/L — ABNORMAL LOW (ref 3.5–5.1)
Sodium: 141 mmol/L (ref 135–145)
Total Bilirubin: 2.6 mg/dL — ABNORMAL HIGH (ref 0.3–1.2)
Total Protein: 6.8 g/dL (ref 6.5–8.1)

## 2020-05-13 LAB — CBC WITH DIFFERENTIAL (CANCER CENTER ONLY)
Abs Immature Granulocytes: 0.01 10*3/uL (ref 0.00–0.07)
Basophils Absolute: 0.1 10*3/uL (ref 0.0–0.1)
Basophils Relative: 1 %
Eosinophils Absolute: 0.3 10*3/uL (ref 0.0–0.5)
Eosinophils Relative: 6 %
HCT: 33.2 % — ABNORMAL LOW (ref 39.0–52.0)
Hemoglobin: 11 g/dL — ABNORMAL LOW (ref 13.0–17.0)
Immature Granulocytes: 0 %
Lymphocytes Relative: 19 %
Lymphs Abs: 0.9 10*3/uL (ref 0.7–4.0)
MCH: 34.5 pg — ABNORMAL HIGH (ref 26.0–34.0)
MCHC: 33.1 g/dL (ref 30.0–36.0)
MCV: 104.1 fL — ABNORMAL HIGH (ref 80.0–100.0)
Monocytes Absolute: 0.5 10*3/uL (ref 0.1–1.0)
Monocytes Relative: 11 %
Neutro Abs: 2.9 10*3/uL (ref 1.7–7.7)
Neutrophils Relative %: 63 %
Platelet Count: 122 10*3/uL — ABNORMAL LOW (ref 150–400)
RBC: 3.19 MIL/uL — ABNORMAL LOW (ref 4.22–5.81)
RDW: 14.6 % (ref 11.5–15.5)
WBC Count: 4.6 10*3/uL (ref 4.0–10.5)
nRBC: 0 % (ref 0.0–0.2)

## 2020-05-13 LAB — TSH: TSH: 2.28 u[IU]/mL (ref 0.320–4.118)

## 2020-05-13 MED ORDER — FUROSEMIDE 20 MG PO TABS
20.0000 mg | ORAL_TABLET | Freq: Every day | ORAL | 3 refills | Status: DC
Start: 2020-05-13 — End: 2020-05-24

## 2020-05-13 MED ORDER — HEPARIN SOD (PORK) LOCK FLUSH 100 UNIT/ML IV SOLN
500.0000 [IU] | Freq: Once | INTRAVENOUS | Status: DC | PRN
  Filled 2020-05-13: qty 5

## 2020-05-13 MED ORDER — SPIRONOLACTONE 50 MG PO TABS: 50.0000 mg | ORAL_TABLET | Freq: Every day | ORAL | 3 refills | Status: DC

## 2020-05-13 MED ORDER — SODIUM CHLORIDE 0.9 % IV SOLN
Freq: Once | INTRAVENOUS | Status: AC
  Filled 2020-05-13: qty 250

## 2020-05-13 MED ORDER — SODIUM CHLORIDE 0.9% FLUSH
10.0000 mL | INTRAVENOUS | Status: DC | PRN
  Filled 2020-05-13: qty 10

## 2020-05-13 MED ORDER — SODIUM CHLORIDE 0.9 % IV SOLN
240.0000 mg | Freq: Once | INTRAVENOUS | Status: AC
  Administered 2020-05-13: 240 mg via INTRAVENOUS
  Filled 2020-05-13: qty 24

## 2020-05-13 NOTE — Progress Notes (Signed)
Ok to treat today despite all abnormal labs per MD Benay Spice.

## 2020-05-13 NOTE — Progress Notes (Signed)
[facility] OFFICE PROGRESS NOTE   Diagnosis: Hepatocellular carcinoma  INTERVAL HISTORY:   [name] returns as scheduled.  He completed another treatment with nivolumab on 04/22/2020.  No rash or diarrhea.  He returned from a trip to [place] last week.  He reports malaise and intermittent "dizziness ".  He has noted increased abdominal distention and leg edema.  Objective:  Vital signs in last 24 hours:  Blood pressure 95/68, pulse 74, temperature (!) 97.2 F (36.2 C), temperature source Tympanic, resp. rate 20, height 5\' 8"  (1.727 m), weight 230 lb 3.2 oz (104.4 kg), SpO2 100 %.    Resp: Lungs clear bilaterally Cardio: Irregular, distant heart sounds GI: Distended with ascites, nontender Vascular: Trace pitting edema at the left greater than right lower leg Neuro: Alert and oriented Skin: No rash  Lab Results:  Lab Results  Component Value Date   WBC 4.6 05/13/2020   HGB 11.0 (L) 05/13/2020   HCT 33.2 (L) 05/13/2020   MCV 104.1 (H) 05/13/2020   PLT 122 (L) 05/13/2020   NEUTROABS 2.9 05/13/2020    CMP  Lab Results  Component Value Date   NA 141 05/13/2020   K 3.4 (L) 05/13/2020   CL 111 05/13/2020   CO2 23 05/13/2020   GLUCOSE 97 05/13/2020   BUN 38 (H) 05/13/2020   CREATININE 2.15 (H) 05/13/2020   CALCIUM 8.3 (L) 05/13/2020   PROT 6.8 05/13/2020   ALBUMIN 2.6 (L) 05/13/2020   AST 46 (H) 05/13/2020   ALT 27 05/13/2020   ALKPHOS 119 05/13/2020   BILITOT 2.6 (H) 05/13/2020   GFRNONAA 32 (L) 05/13/2020   GFRAA 37 (L) 04/08/2020     Medications: I have reviewed the patient's current medications.   Assessment/Plan: 1. Hepatocellular carcinoma ? Multiple Li-Rad 5 liver lesions noted on a CT abdomen/pelvis 01/02/2020 including a 7.1 x 5.7 cm segment 2 lesion, there are right and left hepatic lesions ? CT abdomen/pelvis 01/02/2020-cirrhosis with varices, multiple LR 5 liver lesions, 1.1 cm fluid density lesion in the pancreas head,  splenomegaly, bilateral kidney stones, small amount of right perihepatic ascites ? MRI liver 02/08/2020-cirrhosis, numerous hyperenhancing liver lesions, largest lesion in segment two measuring 7.9 x 6.1 cm, lesions are consistent with multifocal hepatocellular carcinoma, the three largest lesions are categorized as LI-RADS5. 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 Y90 left hepatic lobe 03/01/2020 ? Cycle 1 nivolumab 04/08/2020 ? Cycle 2 nivolumab 04/22/2020 ? Cycle 3 nivolumab 05/13/2020 2. 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 Hypertension 4. BPH 5. Hyperlipidemia 6. History of heavy alcohol use 7. Remote history of tobacco use 8. Renal insufficiency 9. Microscopic hematuria 10. 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  Esophageal mucosal changes consistent with long segment Barrett's esophagus present in the distal esophagus.  Severe portal hypertensive gastropathy found in the entire examined stomach.  Small hiatal hernia. 11.  Mild right hydronephrosis with a probable calculus in the proximal third of the right ureter on the MRI 03/06/2020, renal ultrasound 04/01/2020-multiple nonobstructing bilateral renal calculi 12.  Atrial fibrillation    Disposition: [name] has completed 2 treatments with nivolumab.  He has tolerated treatment well.  He appears to have decompensated cirrhosis with volume overload.  I will contact Dr. Fuller Plan and request an evaluation in the GI  clinic with adjustment of the medical regimen.  I recommended he hold amlodipine for now.  [name] will return for an office visit and nivolumab in 2 weeks.  He has been scheduled with Dr. Lovena Neighbours for management of hydronephrosis and renal stones.  Betsy Coder,  MD  05/13/2020  9:08 AM

## 2020-05-13 NOTE — Telephone Encounter (Addendum)
Patient notified of recommendations and new orders He verbalized understanding to pick up new rx, to bring his daily weights with him to follow up on 11/23.  He is advised to restrict diet to 2000 mg a day.  We discussed reading labels and avoiding pre-packaged food and stick to low sodium fresh food and meats.  He is asked to contact his PCP tomorrow to discuss his BP meds and that he needs to remain on propranolol.  Dr. Fuller Plan, earliest visit is 11/23.  Okay?    CC Dr. Iline Oven

## 2020-05-13 NOTE — Telephone Encounter (Signed)
-----   Message from Ladene Artist, MD sent at 05/13/2020 10:12 AM EST ----- Barbera Setters,   Please schedule appt with me or APP very soon for ascites mgmt.  Recommend he starts furosemide 20 mg qd (not prn) and spironolactone 50 mg qd (not prn) now. 2g Na diet, obtain and record daily weights.   Please ask him to see his PCP for BP mgmt very soon. We would like him to stay on propranolol to reduce his portal pressures, lower the bleeding risk from his esophageal varices, portal gastropathy. So his PCP should discontinue/adjust other BP meds and stay on current dose of propanolol.   MS ----- Message ----- From: Ladell Pier, MD Sent: 05/13/2020   9:12 AM EST To: Ladene Artist, MD  He is being treated with nivolumab for the [facility]  Systolic blood pressure in the 90s, maintained on amlodipine, metoprolol, and Flomax  Has "dizziness ".  I asked him to hold amlodipine.   He also appears volume overloaded with leg edema and increased ascites  Can you guys see him to help with cirrhosis management?  Add Aldactone?  Refer for a paracentesis?  Thanks,  [employer]

## 2020-05-13 NOTE — Patient Instructions (Signed)
[facility] Discharge Instructions for Patients Receiving Chemotherapy  Today you received the following chemotherapy agent: nivolumab.  To help prevent nausea and vomiting after your treatment, we encourage you to take your nausea medication as directed.   If you develop nausea and vomiting that is not controlled by your nausea medication, call the clinic.   BELOW ARE SYMPTOMS THAT SHOULD BE REPORTED IMMEDIATELY:  *FEVER GREATER THAN 100.5 F  *CHILLS WITH OR WITHOUT FEVER  NAUSEA AND VOMITING THAT IS NOT CONTROLLED WITH YOUR NAUSEA MEDICATION  *UNUSUAL SHORTNESS OF BREATH  *UNUSUAL BRUISING OR BLEEDING  TENDERNESS IN MOUTH AND THROAT WITH OR WITHOUT PRESENCE OF ULCERS  *URINARY PROBLEMS  *BOWEL PROBLEMS  UNUSUAL RASH Items with * indicate a potential emergency and should be followed up as soon as possible.  Feel free to call the clinic should you have any questions or concerns. The clinic phone number is [phone].  Please show the CHEMO ALERT CARD at check-in to the Emergency Department and triage nurse.   

## 2020-05-13 NOTE — Addendum Note (Signed)
Addended by: Marlon Pel on: 05/13/2020 04:33 PM   Modules accepted: Orders

## 2020-05-13 NOTE — Telephone Encounter (Signed)
Left message for patient to call back  

## 2020-05-13 NOTE — Telephone Encounter (Signed)
Office appt on 11/23 is OK if that is our soonest. If there is an APP cancellation or I have a cancellation would like to get him in sooner,  BMET next week, at least 5 days after starting furosemide, spironolactone

## 2020-05-14 ENCOUNTER — Encounter (HOSPITAL_BASED_OUTPATIENT_CLINIC_OR_DEPARTMENT_OTHER): Payer: Self-pay | Admitting: Urology

## 2020-05-14 ENCOUNTER — Other Ambulatory Visit: Payer: Self-pay

## 2020-05-14 NOTE — Addendum Note (Signed)
Addended by: Marlon Pel on: 05/14/2020 11:26 AM   Modules accepted: Orders

## 2020-05-14 NOTE — Telephone Encounter (Signed)
Please return phone call

## 2020-05-14 NOTE — Telephone Encounter (Signed)
Left message for patient to call back New lab orders entered 

## 2020-05-14 NOTE — Progress Notes (Signed)
Spoke w/ via phone for pre-op interview---pt Lab needs dos---- cmet             COVID test ------05-18-2020 900 Arrive at -------715 am 05-22-2020 NPO after MN NO Solid Food.  Clear liquids from MN until---615 am then npo Medications to take morning of surgery -----none Diabetic medication -----n/a Patient Special Instructions -----none Pre-Op special Istructions -----none Patient verbalized understanding of instructions that were given at this phone interview. Patient denies shortness of breath, chest pain, fever, cough at this phone interview.  Anesthesia Review: no  PCP: dr Antony Contras Cardiologist : dr hochrein, cardiac clearance jesse cleaver np 04-05-2020 epiclov dr hochrein 02-17-2020 epi Dr stark Cassell Clement 03-12-2020 (cirrhosis) Chest x-ray :none EKG :11-27-2019 epic Echo : 11-10-2018 epic Stress test:none Cardiac Cath : none Activity level: can walk up flight of steps without difficulty Sleep Study/ CPAP :n/a Fasting Blood Sugar :      / Checks Blood Sugar -- times a day:  n/a Blood Thinner/ Instructions /Last Dose:n/a ASA / Instructions/ Last Dose : n/a

## 2020-05-16 ENCOUNTER — Telehealth: Payer: Self-pay

## 2020-05-16 NOTE — Telephone Encounter (Signed)
Left message for patient to please call back. 

## 2020-05-16 NOTE — Telephone Encounter (Signed)
Patient called back, he started his Lasix and Spironolactone on 05/14/20, so he will come into our lab on 05/20/20 for a BMET

## 2020-05-17 DIAGNOSIS — N200 Calculus of kidney: Secondary | ICD-10-CM | POA: Diagnosis not present

## 2020-05-18 ENCOUNTER — Other Ambulatory Visit (HOSPITAL_COMMUNITY)
Admission: RE | Admit: 2020-05-18 | Discharge: 2020-05-18 | Disposition: A | Discharge status: Home / Self Care | Payer: Medicare Other | Source: Ambulatory Visit | Attending: Urology | Admitting: Urology

## 2020-05-18 DIAGNOSIS — Z01812 Encounter for preprocedural laboratory examination: Secondary | ICD-10-CM | POA: Diagnosis not present

## 2020-05-18 DIAGNOSIS — Z20822 Contact with and (suspected) exposure to covid-19: Secondary | ICD-10-CM | POA: Diagnosis not present

## 2020-05-18 LAB — SARS CORONAVIRUS 2 (TAT 6-24 HRS): SARS Coronavirus 2: NEGATIVE

## 2020-05-20 DIAGNOSIS — N183 Chronic kidney disease, stage 3 unspecified: Secondary | ICD-10-CM | POA: Diagnosis not present

## 2020-05-20 DIAGNOSIS — N400 Enlarged prostate without lower urinary tract symptoms: Secondary | ICD-10-CM | POA: Diagnosis not present

## 2020-05-20 DIAGNOSIS — E7800 Pure hypercholesterolemia, unspecified: Secondary | ICD-10-CM | POA: Diagnosis not present

## 2020-05-20 DIAGNOSIS — I4891 Unspecified atrial fibrillation: Secondary | ICD-10-CM | POA: Diagnosis not present

## 2020-05-20 DIAGNOSIS — I10 Essential (primary) hypertension: Secondary | ICD-10-CM | POA: Diagnosis not present

## 2020-05-22 ENCOUNTER — Ambulatory Visit (HOSPITAL_BASED_OUTPATIENT_CLINIC_OR_DEPARTMENT_OTHER): Payer: Medicare Other | Admitting: Anesthesiology

## 2020-05-22 ENCOUNTER — Observation Stay (HOSPITAL_COMMUNITY): Payer: Medicare Other

## 2020-05-22 ENCOUNTER — Other Ambulatory Visit: Payer: Self-pay

## 2020-05-22 ENCOUNTER — Encounter (HOSPITAL_BASED_OUTPATIENT_CLINIC_OR_DEPARTMENT_OTHER): Payer: Self-pay | Admitting: Urology

## 2020-05-22 ENCOUNTER — Inpatient Hospital Stay (HOSPITAL_BASED_OUTPATIENT_CLINIC_OR_DEPARTMENT_OTHER)
Admission: RE | Admit: 2020-05-22 | Discharge: 2020-05-24 | DRG: 660 | Disposition: A | Discharge status: Home / Self Care | Payer: Medicare Other | Attending: Urology | Admitting: Urology

## 2020-05-22 ENCOUNTER — Encounter (HOSPITAL_COMMUNITY)
Admission: RE | Disposition: A | Discharge status: Home / Self Care | Payer: Self-pay | Source: Home / Self Care | Attending: Urology

## 2020-05-22 DIAGNOSIS — I129 Hypertensive chronic kidney disease with stage 1 through stage 4 chronic kidney disease, or unspecified chronic kidney disease: Secondary | ICD-10-CM | POA: Diagnosis not present

## 2020-05-22 DIAGNOSIS — N184 Chronic kidney disease, stage 4 (severe): Secondary | ICD-10-CM | POA: Diagnosis not present

## 2020-05-22 DIAGNOSIS — R079 Chest pain, unspecified: Secondary | ICD-10-CM

## 2020-05-22 DIAGNOSIS — N200 Calculus of kidney: Secondary | ICD-10-CM | POA: Diagnosis not present

## 2020-05-22 DIAGNOSIS — Z87442 Personal history of urinary calculi: Secondary | ICD-10-CM

## 2020-05-22 DIAGNOSIS — I9581 Postprocedural hypotension: Secondary | ICD-10-CM | POA: Diagnosis not present

## 2020-05-22 DIAGNOSIS — N183 Chronic kidney disease, stage 3 unspecified: Secondary | ICD-10-CM | POA: Diagnosis not present

## 2020-05-22 DIAGNOSIS — I8510 Secondary esophageal varices without bleeding: Secondary | ICD-10-CM | POA: Diagnosis not present

## 2020-05-22 DIAGNOSIS — E8770 Fluid overload, unspecified: Secondary | ICD-10-CM | POA: Diagnosis present

## 2020-05-22 DIAGNOSIS — K219 Gastro-esophageal reflux disease without esophagitis: Secondary | ICD-10-CM | POA: Diagnosis present

## 2020-05-22 DIAGNOSIS — C220 Liver cell carcinoma: Secondary | ICD-10-CM | POA: Diagnosis not present

## 2020-05-22 DIAGNOSIS — Z9221 Personal history of antineoplastic chemotherapy: Secondary | ICD-10-CM

## 2020-05-22 DIAGNOSIS — E875 Hyperkalemia: Secondary | ICD-10-CM | POA: Diagnosis not present

## 2020-05-22 DIAGNOSIS — I700 Atherosclerosis of aorta: Secondary | ICD-10-CM | POA: Diagnosis not present

## 2020-05-22 DIAGNOSIS — E669 Obesity, unspecified: Secondary | ICD-10-CM | POA: Diagnosis not present

## 2020-05-22 DIAGNOSIS — Z87891 Personal history of nicotine dependence: Secondary | ICD-10-CM

## 2020-05-22 DIAGNOSIS — N132 Hydronephrosis with renal and ureteral calculous obstruction: Principal | ICD-10-CM | POA: Diagnosis present

## 2020-05-22 DIAGNOSIS — K7031 Alcoholic cirrhosis of liver with ascites: Secondary | ICD-10-CM | POA: Diagnosis present

## 2020-05-22 DIAGNOSIS — E785 Hyperlipidemia, unspecified: Secondary | ICD-10-CM | POA: Diagnosis not present

## 2020-05-22 DIAGNOSIS — N179 Acute kidney failure, unspecified: Secondary | ICD-10-CM | POA: Diagnosis present

## 2020-05-22 DIAGNOSIS — I4819 Other persistent atrial fibrillation: Secondary | ICD-10-CM | POA: Diagnosis present

## 2020-05-22 DIAGNOSIS — R000 Tachycardia, unspecified: Secondary | ICD-10-CM | POA: Diagnosis not present

## 2020-05-22 DIAGNOSIS — Z6832 Body mass index (BMI) 32.0-32.9, adult: Secondary | ICD-10-CM

## 2020-05-22 DIAGNOSIS — Z79899 Other long term (current) drug therapy: Secondary | ICD-10-CM

## 2020-05-22 DIAGNOSIS — Z8249 Family history of ischemic heart disease and other diseases of the circulatory system: Secondary | ICD-10-CM

## 2020-05-22 DIAGNOSIS — I4891 Unspecified atrial fibrillation: Secondary | ICD-10-CM | POA: Diagnosis not present

## 2020-05-22 DIAGNOSIS — R0789 Other chest pain: Secondary | ICD-10-CM | POA: Diagnosis present

## 2020-05-22 DIAGNOSIS — R609 Edema, unspecified: Secondary | ICD-10-CM | POA: Diagnosis not present

## 2020-05-22 HISTORY — DX: Gastro-esophageal reflux disease without esophagitis: K21.9

## 2020-05-22 HISTORY — DX: Personal history of antineoplastic chemotherapy: Z92.21

## 2020-05-22 HISTORY — DX: Personal history of urinary calculi: Z87.442

## 2020-05-22 HISTORY — DX: Alcoholic cirrhosis of liver without ascites: K70.30

## 2020-05-22 HISTORY — PX: CYSTOSCOPY/URETEROSCOPY/HOLMIUM LASER/STENT PLACEMENT: SHX6546

## 2020-05-22 LAB — POCT I-STAT, CHEM 8
BUN: 33 mg/dL — ABNORMAL HIGH (ref 8–23)
Calcium, Ion: 0.9 mmol/L — ABNORMAL LOW (ref 1.15–1.40)
Chloride: 107 mmol/L (ref 98–111)
Creatinine, Ser: 1.9 mg/dL — ABNORMAL HIGH (ref 0.61–1.24)
Glucose, Bld: 82 mg/dL (ref 70–99)
HCT: 41 % (ref 39.0–52.0)
Hemoglobin: 13.9 g/dL (ref 13.0–17.0)
Potassium: 3.9 mmol/L (ref 3.5–5.1)
Sodium: 139 mmol/L (ref 135–145)
TCO2: 22 mmol/L (ref 22–32)

## 2020-05-22 LAB — TROPONIN I (HIGH SENSITIVITY): Troponin I (High Sensitivity): 4 ng/L (ref <18)

## 2020-05-22 LAB — MRSA PCR SCREENING: MRSA by PCR: NEGATIVE

## 2020-05-22 LAB — SAMPLE TO BLOOD BANK

## 2020-05-22 SURGERY — CYSTOSCOPY/URETEROSCOPY/HOLMIUM LASER/STENT PLACEMENT
Anesthesia: General | Laterality: Bilateral

## 2020-05-22 MED ORDER — CEFAZOLIN SODIUM-DEXTROSE 2-4 GM/100ML-% IV SOLN
2.0000 g | Freq: Once | INTRAVENOUS | Status: AC
  Administered 2020-05-22: 2 g via INTRAVENOUS

## 2020-05-22 MED ORDER — CEFAZOLIN SODIUM-DEXTROSE 2-4 GM/100ML-% IV SOLN
INTRAVENOUS | Status: AC
  Filled 2020-05-22: qty 100

## 2020-05-22 MED ORDER — KETOROLAC TROMETHAMINE 15 MG/ML IJ SOLN
INTRAMUSCULAR | Status: DC | PRN
  Administered 2020-05-22: 15 mg via INTRAVENOUS

## 2020-05-22 MED ORDER — PROPOFOL 10 MG/ML IV BOLUS
INTRAVENOUS | Status: DC | PRN
  Administered 2020-05-22: 150 mg via INTRAVENOUS
  Administered 2020-05-22: 50 mg via INTRAVENOUS

## 2020-05-22 MED ORDER — SODIUM CHLORIDE 0.9 % IV SOLN: INTRAVENOUS | Status: DC

## 2020-05-22 MED ORDER — EPHEDRINE SULFATE 50 MG/ML IJ SOLN
INTRAMUSCULAR | Status: DC | PRN
  Administered 2020-05-22 (×2): 10 mg via INTRAVENOUS

## 2020-05-22 MED ORDER — PROPOFOL 10 MG/ML IV BOLUS
INTRAVENOUS | Status: AC
  Filled 2020-05-22: qty 20

## 2020-05-22 MED ORDER — DIGOXIN 0.25 MG/ML IJ SOLN
0.2500 mg | Freq: Once | INTRAMUSCULAR | Status: AC
  Administered 2020-05-22: 0.25 mg via INTRAVENOUS
  Filled 2020-05-22: qty 2

## 2020-05-22 MED ORDER — FENTANYL CITRATE (PF) 100 MCG/2ML IJ SOLN
25.0000 ug | INTRAMUSCULAR | Status: DC | PRN
  Administered 2020-05-22: 25 ug via INTRAVENOUS

## 2020-05-22 MED ORDER — BELLADONNA ALKALOIDS-OPIUM 16.2-60 MG RE SUPP
RECTAL | Status: DC | PRN
  Administered 2020-05-22: 1 via RECTAL

## 2020-05-22 MED ORDER — LACTATED RINGERS IV SOLN: INTRAVENOUS | Status: DC

## 2020-05-22 MED ORDER — ACETAMINOPHEN 325 MG PO TABS: 325.0000 mg | ORAL_TABLET | ORAL | Status: DC | PRN

## 2020-05-22 MED ORDER — OXYCODONE HCL 5 MG/5ML PO SOLN: 5.0000 mg | Freq: Once | ORAL | Status: DC | PRN

## 2020-05-22 MED ORDER — METOPROLOL TARTRATE 5 MG/5ML IV SOLN
5.0000 mg | Freq: Once | INTRAVENOUS | Status: AC
  Administered 2020-05-22: 5 mg via INTRAVENOUS

## 2020-05-22 MED ORDER — PHENYLEPHRINE HCL (PRESSORS) 10 MG/ML IV SOLN
INTRAVENOUS | Status: DC | PRN
  Administered 2020-05-22 (×6): 80 ug via INTRAVENOUS

## 2020-05-22 MED ORDER — LABETALOL HCL 5 MG/ML IV SOLN
INTRAVENOUS | Status: AC
  Filled 2020-05-22: qty 4

## 2020-05-22 MED ORDER — DEXAMETHASONE SODIUM PHOSPHATE 4 MG/ML IJ SOLN
INTRAMUSCULAR | Status: DC | PRN
  Administered 2020-05-22: 5 mg via INTRAVENOUS

## 2020-05-22 MED ORDER — DEXTROSE-NACL 5-0.45 % IV SOLN: INTRAVENOUS | Status: DC

## 2020-05-22 MED ORDER — ONDANSETRON HCL 4 MG PO TABS: 4.0000 mg | ORAL_TABLET | Freq: Every day | ORAL | 1 refills | Status: DC | PRN

## 2020-05-22 MED ORDER — HYDROCODONE-ACETAMINOPHEN 5-325 MG PO TABS: 1.0000 | ORAL_TABLET | ORAL | 0 refills | Status: DC | PRN

## 2020-05-22 MED ORDER — HYDROCODONE-ACETAMINOPHEN 5-325 MG PO TABS: 1.0000 | ORAL_TABLET | ORAL | Status: DC | PRN

## 2020-05-22 MED ORDER — FENTANYL CITRATE (PF) 100 MCG/2ML IJ SOLN
INTRAMUSCULAR | Status: DC | PRN
  Administered 2020-05-22 (×3): 25 ug via INTRAVENOUS

## 2020-05-22 MED ORDER — MORPHINE SULFATE (PF) 4 MG/ML IV SOLN: 2.0000 mg | INTRAVENOUS | Status: DC | PRN

## 2020-05-22 MED ORDER — DIPHENHYDRAMINE HCL 50 MG/ML IJ SOLN: 12.5000 mg | Freq: Four times a day (QID) | INTRAMUSCULAR | Status: DC | PRN

## 2020-05-22 MED ORDER — GLYCOPYRROLATE 0.2 MG/ML IJ SOLN
INTRAMUSCULAR | Status: DC | PRN
  Administered 2020-05-22: .2 mg via INTRAVENOUS

## 2020-05-22 MED ORDER — BELLADONNA ALKALOIDS-OPIUM 16.2-60 MG RE SUPP
RECTAL | Status: AC
  Filled 2020-05-22: qty 1

## 2020-05-22 MED ORDER — ONDANSETRON HCL 4 MG/2ML IJ SOLN
INTRAMUSCULAR | Status: DC | PRN
  Administered 2020-05-22: 4 mg via INTRAVENOUS

## 2020-05-22 MED ORDER — CHLORHEXIDINE GLUCONATE CLOTH 2 % EX PADS: 6.0000 | MEDICATED_PAD | Freq: Every day | CUTANEOUS | Status: DC

## 2020-05-22 MED ORDER — LABETALOL HCL 5 MG/ML IV SOLN
10.0000 mg | Freq: Once | INTRAVENOUS | Status: AC
  Administered 2020-05-22: 10 mg via INTRAVENOUS

## 2020-05-22 MED ORDER — OXYBUTYNIN CHLORIDE 5 MG PO TABS: 5.0000 mg | ORAL_TABLET | Freq: Three times a day (TID) | ORAL | Status: DC | PRN

## 2020-05-22 MED ORDER — IOHEXOL 300 MG/ML  SOLN
INTRAMUSCULAR | Status: DC | PRN
  Administered 2020-05-22: 20 mL via URETHRAL

## 2020-05-22 MED ORDER — BELLADONNA ALKALOIDS-OPIUM 16.2-60 MG RE SUPP: 1.0000 | Freq: Four times a day (QID) | RECTAL | Status: DC | PRN

## 2020-05-22 MED ORDER — CEPHALEXIN 500 MG PO CAPS
500.0000 mg | ORAL_CAPSULE | Freq: Three times a day (TID) | ORAL | Status: DC
  Administered 2020-05-22 – 2020-05-23 (×2): 500 mg via ORAL
  Filled 2020-05-22 (×3): qty 1

## 2020-05-22 MED ORDER — METOPROLOL TARTRATE 5 MG/5ML IV SOLN
INTRAVENOUS | Status: AC
  Filled 2020-05-22: qty 5

## 2020-05-22 MED ORDER — ONDANSETRON HCL 4 MG/2ML IJ SOLN: 4.0000 mg | Freq: Once | INTRAMUSCULAR | Status: DC | PRN

## 2020-05-22 MED ORDER — PHENAZOPYRIDINE HCL 200 MG PO TABS: 200.0000 mg | ORAL_TABLET | Freq: Three times a day (TID) | ORAL | 0 refills | Status: DC | PRN

## 2020-05-22 MED ORDER — SODIUM CHLORIDE 0.9 % IR SOLN
Status: DC | PRN
  Administered 2020-05-22: 6000 mL via INTRAVESICAL

## 2020-05-22 MED ORDER — CEPHALEXIN 500 MG PO CAPS: 500.0000 mg | ORAL_CAPSULE | Freq: Two times a day (BID) | ORAL | 0 refills | Status: AC

## 2020-05-22 MED ORDER — PHENYLEPHRINE HCL-NACL 10-0.9 MG/250ML-% IV SOLN
INTRAVENOUS | Status: DC | PRN
  Administered 2020-05-22: 100 ug/min via INTRAVENOUS

## 2020-05-22 MED ORDER — FENTANYL CITRATE (PF) 100 MCG/2ML IJ SOLN
INTRAMUSCULAR | Status: AC
  Filled 2020-05-22: qty 2

## 2020-05-22 MED ORDER — PHENYLEPHRINE 40 MCG/ML (10ML) SYRINGE FOR IV PUSH (FOR BLOOD PRESSURE SUPPORT)
PREFILLED_SYRINGE | INTRAVENOUS | Status: AC
  Filled 2020-05-22: qty 10

## 2020-05-22 MED ORDER — DIPHENHYDRAMINE HCL 12.5 MG/5ML PO ELIX: 12.5000 mg | ORAL_SOLUTION | Freq: Four times a day (QID) | ORAL | Status: DC | PRN

## 2020-05-22 MED ORDER — OXYCODONE HCL 5 MG PO TABS: 5.0000 mg | ORAL_TABLET | Freq: Once | ORAL | Status: DC | PRN

## 2020-05-22 MED ORDER — GLYCOPYRROLATE PF 0.2 MG/ML IJ SOSY
PREFILLED_SYRINGE | INTRAMUSCULAR | Status: AC
  Filled 2020-05-22: qty 1

## 2020-05-22 MED ORDER — ACETAMINOPHEN 160 MG/5ML PO SOLN: 325.0000 mg | ORAL | Status: DC | PRN

## 2020-05-22 MED ORDER — PHENYLEPHRINE HCL (PRESSORS) 10 MG/ML IV SOLN
INTRAVENOUS | Status: AC
  Filled 2020-05-22: qty 1

## 2020-05-22 MED ORDER — EPHEDRINE 5 MG/ML INJ
INTRAVENOUS | Status: AC
  Filled 2020-05-22: qty 10

## 2020-05-22 MED ORDER — MEPERIDINE HCL 25 MG/ML IJ SOLN: 6.2500 mg | INTRAMUSCULAR | Status: DC | PRN

## 2020-05-22 MED ORDER — ONDANSETRON HCL 4 MG/2ML IJ SOLN: 4.0000 mg | INTRAMUSCULAR | Status: DC | PRN

## 2020-05-22 MED ORDER — LIDOCAINE 2% (20 MG/ML) 5 ML SYRINGE
INTRAMUSCULAR | Status: DC | PRN
  Administered 2020-05-22: 40 mg via INTRAVENOUS
  Administered 2020-05-22: 60 mg via INTRAVENOUS

## 2020-05-22 MED ORDER — ONDANSETRON HCL 4 MG/2ML IJ SOLN
INTRAMUSCULAR | Status: AC
  Filled 2020-05-22: qty 2

## 2020-05-22 MED ORDER — LIDOCAINE 2% (20 MG/ML) 5 ML SYRINGE
INTRAMUSCULAR | Status: AC
  Filled 2020-05-22: qty 5

## 2020-05-22 MED ORDER — ORAL CARE MOUTH RINSE
15.0000 mL | Freq: Two times a day (BID) | OROMUCOSAL | Status: DC
  Administered 2020-05-22 – 2020-05-24 (×4): 15 mL via OROMUCOSAL

## 2020-05-22 MED ORDER — DIGOXIN 0.25 MG/ML IJ SOLN
0.1250 mg | Freq: Three times a day (TID) | INTRAMUSCULAR | Status: AC
  Administered 2020-05-22: 0.125 mg via INTRAVENOUS
  Filled 2020-05-22: qty 2

## 2020-05-22 MED ORDER — OXYBUTYNIN CHLORIDE ER 10 MG PO TB24: 10.0000 mg | ORAL_TABLET | Freq: Every day | ORAL | 11 refills | Status: DC

## 2020-05-22 MED ORDER — DEXAMETHASONE SODIUM PHOSPHATE 10 MG/ML IJ SOLN
INTRAMUSCULAR | Status: AC
  Filled 2020-05-22: qty 1

## 2020-05-22 SURGICAL SUPPLY — 26 items
APL SKNCLS STERI-STRIP NONHPOA (GAUZE/BANDAGES/DRESSINGS)
BAG DRAIN URO-CYSTO SKYTR STRL (DRAIN) ×2 IMPLANT
BAG DRN UROCATH (DRAIN) ×1
BASKET STONE 1.7 NGAGE (UROLOGICAL SUPPLIES) ×1 IMPLANT
BASKET ZERO TIP NITINOL 2.4FR (BASKET) IMPLANT
BENZOIN TINCTURE PRP APPL 2/3 (GAUZE/BANDAGES/DRESSINGS) IMPLANT
BSKT STON RTRVL ZERO TP 2.4FR (BASKET)
CATH URET 5FR 28IN OPEN ENDED (CATHETERS) IMPLANT
CLOTH BEACON ORANGE TIMEOUT ST (SAFETY) ×2 IMPLANT
FIBER LASER FLEXIVA 365 (UROLOGICAL SUPPLIES) IMPLANT
FIBER LASER TRAC TIP (UROLOGICAL SUPPLIES) ×2 IMPLANT
GLOVE BIO SURGEON STRL SZ7.5 (GLOVE) ×2 IMPLANT
GOWN STRL REUS W/TWL XL LVL3 (GOWN DISPOSABLE) ×2 IMPLANT
GUIDEWIRE STR DUAL SENSOR (WIRE) ×1 IMPLANT
GUIDEWIRE ZIPWRE .038 STRAIGHT (WIRE) ×2 IMPLANT
IV NS IRRIG 3000ML ARTHROMATIC (IV SOLUTION) ×4 IMPLANT
KIT TURNOVER CYSTO (KITS) ×2 IMPLANT
MANIFOLD NEPTUNE II (INSTRUMENTS) ×2 IMPLANT
NS IRRIG 500ML POUR BTL (IV SOLUTION) ×2 IMPLANT
PACK CYSTO (CUSTOM PROCEDURE TRAY) ×2 IMPLANT
SHEATH URET ACCESS 12FR/35CM (UROLOGICAL SUPPLIES) ×2 IMPLANT
STENT URET 6FRX24 CONTOUR (STENTS) ×4 IMPLANT
STRIP CLOSURE SKIN 1/2X4 (GAUZE/BANDAGES/DRESSINGS) IMPLANT
SYR 10ML LL (SYRINGE) ×2 IMPLANT
TUBE CONNECTING 12X1/4 (SUCTIONS) IMPLANT
TUBING UROLOGY SET (TUBING) ×2 IMPLANT

## 2020-05-22 NOTE — Anesthesia Preprocedure Evaluation (Addendum)
Anesthesia Evaluation  Patient identified by MRN, date of birth, ID band Patient awake    Reviewed: Allergy & Precautions, H&P , NPO status , Patient's Chart, lab work & pertinent test results, reviewed documented beta blocker date and time   Airway Mallampati: II  TM Distance: >3 FB Neck ROM: full    Dental no notable dental hx.    Pulmonary neg pulmonary ROS, former smoker,    Pulmonary exam normal breath sounds clear to auscultation       Cardiovascular Exercise Tolerance: Good hypertension, Pt. on medications and Pt. on home beta blockers + dysrhythmias Atrial Fibrillation  Rhythm:regular Rate:Normal  ECHO 5/20 The left ventricle has normal systolic function, with an ejection  fraction of 55-60%. The cavity size was normal. There is moderately  increased left ventricular wall thickness. Left ventricular diastolic  Doppler parameters are indeterminate.  2. The right ventricle has normal systolic function. The cavity was  normal. There is no increase in right ventricular wall thickness.  3. Left atrial size was moderately dilated.  4. Right atrial size was mildly dilated.  5. Mild thickening of the mitral valve leaflet. Mild calcification of the  mitral valve leaflet. There is mild mitral annular calcification present.  6. The aortic valve is tricuspid. Moderate thickening of the aortic  valve. Sclerosis without any evidence of stenosis of the aortic valve.       Neuro/Psych negative neurological ROS  negative psych ROS   GI/Hepatic GERD  Medicated,(+) Cirrhosis       ,   Endo/Other  negative endocrine ROS  Renal/GU Renal disease  negative genitourinary   Musculoskeletal   Abdominal   Peds  Hematology  (+) Blood dyscrasia, anemia ,   Anesthesia Other Findings   Reproductive/Obstetrics negative OB ROS                            Anesthesia Physical Anesthesia Plan  ASA:  III  Anesthesia Plan: General   Post-op Pain Management:    Induction: Intravenous  PONV Risk Score and Plan: 2 and Dexamethasone, Ondansetron and Treatment may vary due to age or medical condition  Airway Management Planned: LMA and Oral ETT  Additional Equipment: None  Intra-op Plan:   Post-operative Plan: Extubation in OR  Informed Consent: I have reviewed the patients History and Physical, chart, labs and discussed the procedure including the risks, benefits and alternatives for the proposed anesthesia with the patient or authorized representative who has indicated his/her understanding and acceptance.     Dental Advisory Given  Plan Discussed with: CRNA and Anesthesiologist  Anesthesia Plan Comments: ( )        Anesthesia Quick Evaluation

## 2020-05-22 NOTE — Progress Notes (Signed)
Room is ready report called to the floor RN.

## 2020-05-22 NOTE — H&P (Signed)
Office Visit Report     05/17/2020   --------------------------------------------------------------------------------   Dillon Perkins  MRN: [id number]  DOB: 1950-11-04, 69 year old Male   PRIMARY CARE:  Remo Lipps A. Daub Retired, MD  REFERRING:  Antony Contras, MD  PROVIDER:  Ellison Hughs, M.D.  TREATING:  Jiles Crocker, NP  LOCATION:  [facility] [phone]     --------------------------------------------------------------------------------   CC/HPI: 04/01/2020: Dillon Perkins is a 69 year old male with a past medical history of hepatocellular carcinoma (s/p Y90 radioembolization with plans to start immunotherapy), kidney stones, microscopic hematuria, atrial fibrillation, HTN, CKD stage III, HLD and obesity.   Cardiologist: Minus Breeding, MD   -He is planning to receive [place] starting October 4th.   -Dillon Perkins is here today after he was noted to have a small right UPJ stone with mild hydronephrosis on recent surveillance MRI. Today, he denies right flank pain, dysuria, hematuria, nausea/vomiting or fever/chills. He has not seen a stone in his urine.   05/17/2020: Patient presents today for preoperative appointment prior to undergoing bilateral ureteroscopy with Dr. Lovena Neighbours on 11/17. Dillon Perkins has completed 2 treatments with nivolumab and has tolerated treatment well in regards to his known hepatocellular carcinoma diagnosis. Per note review in Epic, He appears to have decompensated cirrhosis with volume overload. He has recently had some issues in regards to volume overload. His other providers have him now taking diuretics in regards to this, aldactone and lasix. This has worked well for him in regards to decreasing lower extremity edema and pulmonary congestion, increasing exercise intolerance. He denies chest pain or discomfort. No longer having any SOB or cough. With the diuretics which he takes in the evening, he's had an expected increase in urgency and  nocturia. I recommended he take the medication in the morning unless directed otherwise. This will help limit his bothersome evening and night time symptoms.   He denies any interval burning or painful urination, denies visible blood in the urine although there is continued microscopic hematuria on today's urinalysis. He denies interval stone material passage. He has not had any unilateral lower back or flank pain/discomfort suggestive of obstructive uropathy. Furthermore he denies interval fevers or chills, nausea/vomiting, UTI treatment. He has PMH of afib and continues on anticoagulation therapy in regards to that but plans to stop that as directed prior to his upcoming procedure.     ALLERGIES:     MEDICATIONS: Metoprolol Succinate 100 mg tablet, extended release 24 hr  Tamsulosin Hcl 0.4 mg capsule 1 capsule PO Daily  Amlodipine Besylate 5 mg tablet  Amlodipine Besylate 5 mg tablet  Coq-10 100 mg capsule  Eliquis 5 mg tablet  Ibuprofen TABS Oral  Pravastatin Sodium 40 mg tablet     GU PSH: Locm 300-399Mg /Ml Iodine,1Ml - 01/02/2020 Vasectomy - 2011       [place] Notes: Surgery Of Male Genitalia Vasectomy, Appendectomy   NON-GU PSH: Appendectomy - 2011     GU PMH: Microscopic hematuria - 04/01/2020, - 12/18/2019 Renal calculus - 04/01/2020 Ureteral calculus - 04/01/2020 Weak Urinary Stream - 12/18/2019 Nocturia - 08/07/2019 BPH w/LUTS, Benign prostatic hyperplasia with urinary obstruction - 2014 ED due to arterial insufficiency, Erectile dysfunction due to arterial insufficiency - 2014 Primary hypogonadism, Hypogonadism, testicular - 2014 Urinary Calculus, Unspec      PMH Notes:  1898-07-06 00:00:00 - Note: Normal Routine History And Physical Adult   NON-GU PMH: Personal history of other diseases of the circulatory system, History of hypertension - 2014 Personal history of  other endocrine, nutritional and metabolic disease, History of hypercholesterolemia -  2014 Arrhythmia Hypercholesterolemia Hypertension    FAMILY HISTORY: Breast Cancer - Mother Family Health Status Number - Runs In Family Father Deceased At Age54 ___ - Runs In [place] - Sister, Father Mother Deceased At Age 1 from diabetic complicati - Runs In Family   SOCIAL HISTORY: Marital Status: Married Preferred Language: English; Ethnicity: Not Hispanic Or Latino; Race: White Current Smoking Status: Patient does not smoke anymore. Has not smoked since 08/06/1985.   Tobacco Use Assessment Completed: Used Tobacco in last 30 days? Does not use smokeless tobacco. Does drink.  Does not use drugs. Drinks 1 caffeinated drink per day. 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     Notes: Caffeine Use, Marital History - Currently Married, Alcohol Use, Tobacco Use   REVIEW OF SYSTEMS:    GU Review Male:   Patient denies frequent urination, hard to postpone urination, burning/ pain with urination, get up at night to urinate, leakage of urine, stream starts and stops, trouble starting your stream, have to strain to urinate , erection problems, and penile pain.  Gastrointestinal (Upper):   Patient denies nausea, vomiting, and indigestion/ heartburn.  Gastrointestinal (Lower):   Patient denies diarrhea and constipation.  Constitutional:   Patient denies fever, night sweats, weight loss, and fatigue.  Skin:   Patient denies skin rash/ lesion and itching.  Eyes:   Patient denies blurred vision and double vision.  Ears/ Nose/ Throat:   Patient denies sore throat and sinus problems.  Hematologic/Lymphatic:   Patient denies easy bruising and swollen glands.  Cardiovascular:   Patient denies leg swelling and chest pains.  Respiratory:   Patient denies cough and shortness of breath.  Endocrine:   Patient denies excessive thirst.  Musculoskeletal:   Patient denies back pain and joint pain.  Neurological:   Patient denies headaches and dizziness.  Psychologic:    Patient denies depression and anxiety.   VITAL SIGNS:      05/17/2020 08:48 AM  Weight 227 lb / 102.97 kg  Height 68 in / 172.72 cm  BP 102/70 mmHg  Pulse 76 /min  Temperature 67.3 F / 19.6 C  BMI 34.5 kg/m   MULTI-SYSTEM PHYSICAL EXAMINATION:    Constitutional: Well-nourished. No physical deformities. Normally developed. Good grooming.  Neck: Neck symmetrical, not swollen. Normal tracheal position.  Respiratory: No labored breathing, no use of accessory muscles.   Cardiovascular: Abnormal heart rhythm (stable, irregular, consistent with known Afib). Normal temperature, normal extremity pulses, no swelling, no varicosities.   Neurologic / Psychiatric: Oriented to time, oriented to place, oriented to person. No depression, no anxiety, no agitation.  Gastrointestinal: Obese abdomen with mild, stable distension. No mass, no tenderness, no rigidity. No CVA or flank tenderness.   Musculoskeletal: Normal gait and station of head and neck.     Complexity of Data:  Source Of History:  Patient, Medical Record Summary  Records Review:   Previous Doctor Records, Previous Hospital Records, Previous Patient Records  Urine Test Review:   Urinalysis  X-Ray Review: C.T. Abdomen/Pelvis: Reviewed Films. Reviewed Report.    CLINICAL DATA: Microhematuria found in February 2021.   EXAM:  CT ABDOMEN AND PELVIS WITHOUT AND WITH CONTRAST   TECHNIQUE:  Multidetector CT imaging of the abdomen and pelvis was performed  following the standard protocol before and following the bolus  administration of intravenous contrast.   CONTRAST: 125 cc Omnipaque 300   COMPARISON: Overlapping portion of chest CT from  09/08/2012   FINDINGS:  Lower chest: Circumflex and right coronary artery atherosclerotic  calcification along with atherosclerosis of the descending thoracic  aorta.   Uphill varices adjacent to the distal esophagus.   Hepatobiliary: Hepatic cirrhosis noted.   In segment 2 of the liver, a 7.1  by 5.7 cm lesion with enhancement  in the late arterial phase demonstrates washout and capsule  appearance along with mild central necrosis. Washout and capsule  appearance noted although the immediate postcontrast images are late  arterial/early portal venous phase rather than dedicated arterial  phase, the appearance is compatible with LI-RADS category LR 5  (hepatocellular carcinoma).   Just below this, an early enhancing lesion in lateral segment left  hepatic lobe measures 3.3 by 3.3 cm on image 24/5 and demonstrates  washout appearance, compatible with LI-RADS category LR 5  (hepatocellular carcinoma).   Further inferiorly in segment 3, a 1.8 by 1.5 cm enhancing mass with  washout is present compatible with LI-RADS category LR 5  (hepatocellular carcinoma).   In the right hepatic lobe on image 26/5, and early phase enhancing  mass with washout is compatible with LI-RADS category LR 5  (hepatocellular carcinoma).   A 1.1 cm enhancing nodule in segment 4a of the liver on image 16/5  appears to have washout. Appearance compatible with LI-RADS category  LR 5 (hepatocellular carcinoma).   Mild gallbladder wall thickening noted. Several small dependent  gallstones in the gallbladder.   Pancreas: 1.1 by 0.9 cm fluid density lesion in the pancreatic head  could be a postinflammatory lesion or a small intraductal papillary  mucinous neoplasm.   Spleen: The spleen measures 11.8 by 7.7 by 11.1 cm (volume = 530  cm^3), compatible with mild splenomegaly. No focal splenic lesion   Adrenals/Urinary Tract: 14 nonobstructive right renal calculi  measuring up to 0.9 cm in long axis.   Nonobstructive left renal calculi measure up to 1.7 cm in long axis.   No ureteral or bladder calculus or other filling defect along the  urothelium. No significant abnormal renal parenchymal enhancement.   Stomach/Bowel: Sigmoid colon diverticulosis.   Vascular/Lymphatic: Uphill varices in the  paraesophageal region.  Right gastric varices. These are indicative of portal venous  hypertension.   Aortoiliac atherosclerotic vascular disease.   Reproductive: Unremarkable   Other: Small amount of ascites in the right perihepatic region right  lower quadrant.   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  Small umbilical hernia containing adipose tissue.   IMPRESSION:  1. Hepatic cirrhosis with multifocal hepatocellular carcinoma in the  liver. Five LI-RADS category LR-5 lesions are observed.  2. Portal venous hypertension including mild splenomegaly,  esophageal, gastric, and right gastric varices.  3. Numerous bilateral nonobstructive renal calculi.  4. Small fluid density lesion in the pancreatic head could be a  postinflammatory lesion or a small intraductal papillary mucinous  neoplasm. While I suspect that the patient's liver findings will  lead to adequate surveillance imaging, otherwise pancreatic protocol  CT or MRI would be recommended in 2 years time. This recommendation  follows ACR consensus guidelines: Management of Incidental  Pancreatic Cysts: A White Paper of the ACR Incidental Findings  Committee. J Am Coll Radiol 5176;16:073-710.  5. Other imaging findings of potential clinical significance:  Coronary atherosclerosis. 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 Small amount of ascites. Cholelithiasis. Sigmoid colon  diverticulosis.  6. Aortic atherosclerosis.   Aortic Atherosclerosis (ICD10-I70.0).    Electronically Signed  By: Van Clines M.D.  On: 01/03/2020 08:51      08/07/19 07/31/09  PSA  Total PSA 3.18 ng/mL 1.38     08/01/09  Hormones  Testosterone, Total 249.0     05/17/20  Urinalysis  Urine Appearance Cloudy   Urine Color Yellow   Urine Glucose Neg mg/dL   Urine Bilirubin Neg mg/dL  Urine Ketones Neg mg/dL  Urine Specific Gravity 1.020   Urine Blood 3+ ery/uL  Urine pH 6.0   Urine Protein Neg mg/dL  Urine Urobilinogen 0.2 mg/dL  Urine Nitrites Neg   Urine Leukocyte Esterase Neg leu/uL  Urine WBC/hpf NS (Not Seen)   Urine RBC/hpf >60/hpf   Urine Epithelial Cells NS (Not Seen)   Urine Bacteria Rare (0-9/hpf)   Urine Mucous Not Present   Urine Yeast NS (Not Seen)   Urine Trichomonas Not Present   Urine Cystals NS (Not Seen)   Urine Casts NS (Not Seen)   Urine Sperm Not Present    PROCEDURES:          Urinalysis w/Scope Dipstick Dipstick Cont'd Micro  Color: Yellow Bilirubin: Neg mg/dL WBC/hpf: NS (Not Seen)  Appearance: Cloudy Ketones: Neg mg/dL RBC/hpf: >60/hpf  Specific Gravity: 1.020 Blood: 3+ ery/uL Bacteria: Rare (0-9/hpf)  pH: 6.0 Protein: Neg mg/dL Cystals: NS (Not Seen)  Glucose: Neg mg/dL Urobilinogen: 0.2 mg/dL Casts: NS (Not Seen)    Nitrites: Neg Trichomonas: Not Present    Leukocyte Esterase: Neg leu/uL Mucous: Not Present      Epithelial Cells: NS (Not Seen)      Yeast: NS (Not Seen)      Sperm: Not Present    ASSESSMENT:      ICD-10 Details  1 GU:   Renal calculus - N20.0 Bilateral, Chronic, Stable  2   Microscopic hematuria - R31.1 Chronic, Stable   PLAN:           Orders Labs Urine Culture          Schedule Return Visit/Planned Activity: Keep Scheduled Appointment - Follow up MD          Document Letter(s):  Created for Patient: Clinical Summary         Notes: The risks, benefits and alternatives of cystoscopy with bilateral ureteroscopy, laser lithotripsy and ureteral stent placement was discussed the patient. Risks included, but are not limited to: bleeding, urinary tract infection, ureteral injury/avulsion, ureteral stricture formation, retained stone fragments, the possibility that multiple surgeries may be required to treat the stone(s), MI, stroke, PE and the inherent risks of general  anesthesia. The patient voices understanding and wishes to proceed.

## 2020-05-22 NOTE — Progress Notes (Signed)
Dr. Lovena Neighbours in to see earlier after speaking with Dr. Luane School. BP labile. Dr. Luane School aware and he administered Neosynephrine 80 mcg.

## 2020-05-22 NOTE — Anesthesia Procedure Notes (Signed)
Procedure Name: LMA Insertion Date/Time: 05/22/2020 9:16 AM Performed by: Justice Rocher, CRNA Pre-anesthesia Checklist: Patient identified, Emergency Drugs available, Suction available, Patient being monitored and Timeout performed Patient Re-evaluated:Patient Re-evaluated prior to induction Oxygen Delivery Method: Circle system utilized Preoxygenation: Pre-oxygenation with 100% oxygen Induction Type: IV induction Ventilation: Mask ventilation without difficulty LMA: LMA inserted LMA Size: 5.0 Number of attempts: 1 Airway Equipment and Method: Bite block Placement Confirmation: positive ETCO2,  breath sounds checked- equal and bilateral and CO2 detector Tube secured with: Tape Dental Injury: Teeth and Oropharynx as per pre-operative assessment

## 2020-05-22 NOTE — Progress Notes (Addendum)
Attempted to transfer to Phase 2 via stretcher. Became dizzy and nauseous when sitting up. Back to PACU , Dr. Luane School aware. Orders given for Troponin and will call Dr. Lovena Neighbours for admission.

## 2020-05-22 NOTE — Progress Notes (Signed)
Chest pain, now described as pressure, slightly better. Dr. Luane School at bedside and administered Neosynephrine 40 MCG IV push.

## 2020-05-22 NOTE — Progress Notes (Signed)
The patient started experiencing chest pain postoperatively.  Dr. Luane School with anesthesia requests 23-hour observation.  Cardiac enzymes and chest x-ray are pending.  Dr. Luane School is in the process of contacting Dr. Percival Spanish with cardiology for further recs.  Will continue to monitor.

## 2020-05-22 NOTE — Transfer of Care (Signed)
Immediate Anesthesia Transfer of Care Note  Patient: [name]  Procedure(s) Performed: CYSTOSCOPY/RETROGRADE/URETEROSCOPY/HOLMIUM LASER/STENT PLACEMENT STONE BASKET RETRIVAL  (Bilateral )  Patient Location: PACU  Anesthesia Type:General  Level of Consciousness: drowsy and patient cooperative  Airway & Oxygen Therapy: Patient Spontanous Breathing and Patient connected to nasal cannula oxygen  Post-op Assessment: Report given to RN and Post -op Vital signs reviewed and stable  Post vital signs: Reviewed and stable  Last Vitals:  Vitals Value Taken Time  BP 114/89 05/22/20 1123  Temp 36.4 C 05/22/20 1123  Pulse 117 05/22/20 1126  Resp 10 05/22/20 1126  SpO2 96 % 05/22/20 1126  Vitals shown include unvalidated device data.  Last Pain:  Vitals:   05/22/20 0756  TempSrc: Oral  PainSc: 0-No pain      Patients Stated Pain Goal: 5 (73/73/66 [id number])  Complications: No complications documented.

## 2020-05-22 NOTE — Progress Notes (Signed)
C/O chest pain, unable to describe. HR remains 110-120 after labetalol. BP 91/68. Dr. Luane School at bedside. New orders given

## 2020-05-22 NOTE — Op Note (Signed)
Operative Note  Preoperative diagnosis:  1.  Numerous bilateral renal stones 2.  Obstructing 6 mm right mid ureteral stone  Postoperative diagnosis: Same  Procedure(s): 1.  Cystoscopy with bilateral ureteroscopy, bilateral holmium laser lithotripsy and bilateral JJ stent placement 2.  Bilateral retrograde pyelograms with intraoperative interpretation of fluoroscopic imaging  Surgeon: Ellison Hughs, MD  Assistants:  None  Anesthesia:  General  Complications:  None  EBL: 20 mL  Specimens: 1.  Right ureteral and bilateral renal stone fragments  Drains/Catheters: 1.  Bilateral 6 French, 24 cm JJ stents without tethers  Intraoperative findings:   1. Left retrograde pyelogram revealed no filling defects along the length of the left ureter or within the left renal pelvis. 2. Right retrograde pyelogram revealed a filling defect within the midportion of the right ureter, consistent with the obstructing stone seen on recent MRI.  There was a mild degree of proximal dilation of the right ureter and renal pelvis with no other associated filling defects. 3. Obstructing 6 mm right mid ureteral stone 4. Numerous bilateral renal stones  Indication:  Dillon Perkins is a 69 y.o. male with medical history of hepatocellular carcinoma, kidney stones, microscopic hematuria, atrial fibrillation, HTN, CKD stage III, HLD and obesity.  He was found to have numerous bilateral renal stones on recent CT and MRI.  He has been consented for the above procedures, voices understanding wishes to proceed.  Description of procedure:  After informed consent was obtained, the patient was brought to the operating room and general LMA anesthesia was administered. The patient was then placed in the dorsolithotomy position and prepped and draped in the usual sterile fashion. A timeout was performed. A 23 French rigid cystoscope was then inserted into the urethral meatus and advanced into the bladder under  direct vision. A complete bladder survey revealed no intravesical pathology.  A 5 French ureteral catheter was then inserted into the left ureteral orifice and a retrograde pyelogram was obtained, with the findings listed above.  A Glidewire was then used to intubate the lumen of the ureteral catheter and was advanced up to the left renal pelvis, under fluoroscopic guidance.  The catheter was then removed, leaving the wire in place.  A sensor wire was then advanced up the left ureter to the left renal pelvis, under fluoroscopic guidance.  A 38 cm, 12/14 French ureteral access sheath was then advanced over the sensor wire and into position within the proximal aspects of the left ureter.  Flexible ureteroscope was then advanced through the lumen of the ureteral access sheath.  A complete inspection of the left renal pelvis and its associated calyces revealed numerous 5 to 10 mm renal stones.  A 200 m holmium laser was then used to dust the stones.  The ureteral access sheath was then removed under direct vision revealing no evidence of ureteral trauma or ureteral stone burden.  A 6 French, 24 cm JJ stent was then advanced over the wire and into good position within the left collecting system, confirming placement via fluoroscopy.  A 5 French ureteral catheter was then inserted into the right ureteral orifice and a retrograde pyelogram was obtained, with the findings listed above.  A Glidewire was then used to intubate the lumen of the ureteral catheter and was advanced up to the right renal pelvis, under fluoroscopic guidance.  The catheter was then removed, leaving the wire in place.  An additional sensor wire was then advanced up the right ureter to the right renal pelvis,  or fluoroscopic guidance.  A 38 cm, 12/14 French ureteral access sheath was then advanced over the sensor wire and into position within the proximal aspects of the right ureter, just distal to the filling defect noted on RPG.  The  flexible ureteroscope was advanced through the lumen of the access sheath and into the mid ureter where an obstructing stone was identified.  A 200 m holmium laser was then used to fracture the stone into numerous smaller pieces that were then removed with an engage basket.  The flexible ureteroscope was then advanced up to the right renal pelvis revealing numerous 5 mm calculi in multiple calyces.  The 200 m holmium laser was then used to dust the stones.  The flexible ureteroscope was then removed under direct vision, revealing no evidence of ureteral trauma or residual stone burden.  A 6 French, 24 cm JJ stent was then advanced over the Glidewire and into good position within the right collecting system, confirming placement via fluoroscopy.  The patient's bladder was then drained.  He tolerated the procedure well and was transferred to the postanesthesia in stable condition.  Plan: Follow-up in 1 week for office cystoscopy and stent removal

## 2020-05-22 NOTE — Anesthesia Postprocedure Evaluation (Signed)
Anesthesia Post Note  Patient: Dillon Perkins  Procedure(s) Performed: CYSTOSCOPY/RETROGRADE/URETEROSCOPY/HOLMIUM LASER/STENT PLACEMENT STONE BASKET RETRIVAL  (Bilateral )     Patient location during evaluation: PACU Anesthesia Type: General Level of consciousness: awake and alert Pain management: pain level controlled Vital Signs Assessment: post-procedure vital signs reviewed and stable Respiratory status: spontaneous breathing, nonlabored ventilation, respiratory function stable and patient connected to nasal cannula oxygen Cardiovascular status: blood pressure returned to baseline and stable Postop Assessment: no apparent nausea or vomiting Anesthetic complications: no   No complications documented.  Last Vitals:  Vitals:   05/22/20 1839 05/22/20 1925  BP: (!) 84/60   Pulse:    Resp: (!) 22   Temp:  (!) 36.3 C  SpO2:      Last Pain:  Vitals:   05/22/20 1925  TempSrc: Axillary  PainSc:        Pt experienced some chest discomfort in the recover room.  Several doses of neosynephrine  were given to support blood pressure.  A cardiology consult was call to evaluate care.            Denell Cothern

## 2020-05-22 NOTE — Progress Notes (Signed)
Additional Neosynephrine 40 MCG given by Dr. Luane School.

## 2020-05-22 NOTE — Consult Note (Signed)
Cardiology Consultation:   Patient ID: Dillon Perkins MRN: [id number]; DOB: 1950/10/30  Admit date: 05/22/2020 Date of Consult: 05/22/2020  Primary Care Provider: Antony Contras, MD [facility] Cardiologist: Minus Breeding, MD  [facility] Electrophysiologist:  None    Patient Profile:   Dillon Perkins is a 69 y.o. male with a hx of hepatocellular carcinoma, cirrhosis/esophageal varices, prior EtOH/tobacco abuse, persistent atrial fibrillation, CKD IV, hypertension, and hyperlipidemia who is being seen today for the evaluation of atrial fibrillation with rapid ventricular response at the request of Dr. Ambrose Pancoast.  History of Present Illness:   Dillon Perkins is currently undergoing immunotherapy with nivolumab for hepatocellular carcinoma.  He presents today for cystoscopy with bilateral ureteroscopy, laser lithotripsy, and bilateral JJ stent placement. This procedure was complicated by atrial fibrillation with rapid ventricular response and hypotension. He last saw Dr. Percival Spanish on 02/2020. At that time he was in rate controlled atrial fibrillation and blood pressure was stable. Since then he is struggled with low blood pressures. He has been working with his oncology team and amlodipine was discontinued. He also had some lower extremity edema and ascites and was started on furosemide with improvement in his edema. The swelling did start after a long flight to [place]. He had left greater than right lower extremity edema. He denied any orthopnea or PND. He also reports exertional dyspnea and some chest pressure when his blood pressure is low. He notes that when his blood pressure is stable he does not have exertional chest pain. Since stopping amlodipine this had been mostly resolved. However today, after his procedure he developed hypotension and A. fib with RVR. He reported chest tightness upon standing. He received IV labetalol and metoprolol without significant improvement in his heart  rate. He did become hypotensive and required phenylephrine.  Dillon Perkins had an echo in 2020 that revealed LVEF 55 to 60% with indeterminate diastolic function. The echo was otherwise unremarkable. While in the PACU high-sensitivity troponin was checked and was 4. He had an EKG that revealed atrial fibrillation in the 120s and no acute ischemic changes. His hemoglobin has been stable and he has not required any transfusions with his treatments.  TSH was normal 05/13/2020.   Past Medical History:  Diagnosis Date  . Alcoholic cirrhosis ([place])   . Atrial fibrillation ([place])   . GERD (gastroesophageal reflux disease)   . Hepatocellular carcinoma ([place])   . History of chemotherapy taking immunotherapy   last dose 05-13-2020  . History of kidney stones   . Hyperlipidemia   . Hypertension     Past Surgical History:  Procedure Laterality Date  . APPENDECTOMY  yrs ago  . IR ANGIOGRAM SELECTIVE EACH ADDITIONAL VESSEL  02/19/2020  . IR ANGIOGRAM SELECTIVE EACH ADDITIONAL VESSEL  02/19/2020  . IR ANGIOGRAM SELECTIVE EACH ADDITIONAL VESSEL  02/19/2020  . IR ANGIOGRAM SELECTIVE EACH ADDITIONAL VESSEL  02/19/2020  . IR ANGIOGRAM SELECTIVE EACH ADDITIONAL VESSEL  02/19/2020  . IR ANGIOGRAM SELECTIVE EACH ADDITIONAL VESSEL  02/19/2020  . IR ANGIOGRAM SELECTIVE EACH ADDITIONAL VESSEL  03/01/2020  . IR ANGIOGRAM SELECTIVE EACH ADDITIONAL VESSEL  03/01/2020  . IR ANGIOGRAM SELECTIVE EACH ADDITIONAL VESSEL  03/01/2020  . IR ANGIOGRAM VISCERAL SELECTIVE  02/19/2020  . IR ANGIOGRAM VISCERAL SELECTIVE  03/01/2020  . IR EMBO ARTERIAL NOT HEMORR HEMANG INC GUIDE ROADMAPPING  02/19/2020  . IR EMBO TUMOR ORGAN ISCHEMIA INFARCT INC GUIDE ROADMAPPING  02/19/2020  . IR EMBO TUMOR ORGAN ISCHEMIA INFARCT INC GUIDE ROADMAPPING  03/01/2020  .  IR RADIOLOGIST EVAL & MGMT  01/30/2020  . IR US GUIDE VASC ACCESS RIGHT  02/19/2020  . IR US GUIDE VASC ACCESS RIGHT  03/01/2020     Home Medications:  Prior to Admission medications     Medication Sig Start Date End Date Taking? Authorizing Provider  Coenzyme Q10 (COQ10) 100 MG CAPS Take by mouth daily.   Yes [provider]  furosemide (LASIX) 20 MG tablet Take 1 tablet (20 mg total) by mouth daily. Patient taking differently: Take 20 mg by mouth at bedtime.  05/13/20  Yes Ladene Artist, MD  Ibuprofen-diphenhydrAMINE Cit (ADVIL PM PO) Take by mouth at bedtime as needed.   Yes [provider]  pantoprazole (PROTONIX) 40 MG tablet Take 1 tablet (40 mg total) by mouth daily. Patient taking differently: Take 40 mg by mouth at bedtime.  03/19/20  Yes Ladene Artist, MD  pravastatin (PRAVACHOL) 40 MG tablet Take 1 tablet by mouth at bedtime.  09/26/18  Yes [provider]  propranolol ER (INDERAL LA) 80 MG 24 hr capsule Take 1 capsule (80 mg total) by mouth daily. Patient taking differently: Take 80 mg by mouth at bedtime.  04/05/20  Yes Ladene Artist, MD  spironolactone (ALDACTONE) 50 MG tablet Take 1 tablet (50 mg total) by mouth daily. 05/13/20  Yes Ladene Artist, MD  tamsulosin (FLOMAX) 0.4 MG CAPS capsule Take 0.4 mg by mouth at bedtime.  01/14/20  Yes [provider]  amLODipine (NORVASC) 10 MG tablet Take 10 mg by mouth at bedtime.  12/26/19   [provider]  cephALEXin (KEFLEX) 500 MG capsule Take 1 capsule (500 mg total) by mouth 2 (two) times daily for 3 days. Start taking on 05/28/2020 05/22/20 05/25/20  Ceasar Mons, MD  HYDROcodone-acetaminophen (NORCO) 5-325 MG tablet Take 1 tablet by mouth every 4 (four) hours as needed for moderate pain. 05/22/20   Ceasar Mons, MD  ondansetron (ZOFRAN) 4 MG tablet Take 1 tablet (4 mg total) by mouth daily as needed for nausea or vomiting. 05/22/20 05/22/21  Ceasar Mons, MD  oxybutynin (DITROPAN XL) 10 MG 24 hr tablet Take 1 tablet (10 mg total) by mouth daily. 05/22/20 05/22/21  Ceasar Mons, MD  phenazopyridine (PYRIDIUM) 200 MG  tablet Take 1 tablet (200 mg total) by mouth 3 (three) times daily as needed (for pain with urination). 05/22/20 05/22/21  Ceasar Mons, MD    Inpatient Medications: Scheduled Meds: . cephALEXin  500 mg Oral TID  . digoxin  0.125 mg Intravenous Q8H  . digoxin  0.25 mg Intravenous Once   Continuous Infusions: . sodium chloride    . lactated ringers 50 mL/hr at 05/22/20 1427   PRN Meds: acetaminophen **OR** acetaminophen (TYLENOL) oral liquid 160 mg/5 mL, diphenhydrAMINE **OR** diphenhydrAMINE, fentaNYL (SUBLIMAZE) injection, HYDROcodone-acetaminophen, meperidine (DEMEROL) injection, morphine injection, ondansetron (ZOFRAN) IV, ondansetron, opium-belladonna, oxybutynin, oxyCODONE **OR** oxyCODONE  Allergies:   No Known Allergies  Social History:   Social History   Socioeconomic History  . Marital status: Married    Spouse name: Not on file  . Number of children: Not on file  . Years of education: Not on file  . Highest education level: Not on file  Occupational History  . Not on file  Tobacco Use  . Smoking status: Former Smoker    Packs/day: 1.00    Years: 0.50    Pack years: 0.50    Types: Cigarettes    Quit date: 08/21/1972  Years since quitting: 47.7  . Smokeless tobacco: Never Used  Vaping Use  . Vaping Use: Never used  Substance and Sexual Activity  . Alcohol use: Not Currently    Alcohol/week: 2.0 standard drinks    Types: 2 Cans of beer per week  . Drug use: No  . Sexual activity: Not on file  Other Topics Concern  . Not on file  Social History Narrative   Lives with wife.  Retired.  Three sons.    Social Determinants of Health   Financial Resource Strain:   . Difficulty of Paying Living Expenses: Not on file  Food Insecurity:   . Worried About Charity fundraiser in the Last Year: Not on file  . Ran Out of Food in the Last Year: Not on file  Transportation Needs:   . Lack of Transportation (Medical): Not on file  . Lack of  Transportation (Non-Medical): Not on file  Physical Activity:   . Days of Exercise per Week: Not on file  . Minutes of Exercise per Session: Not on file  Stress:   . Feeling of Stress : Not on file  Social Connections:   . Frequency of Communication with Friends and Family: Not on file  . Frequency of Social Gatherings with Friends and Family: Not on file  . Attends Religious Services: Not on file  . Active Member of Clubs or Organizations: Not on file  . Attends Archivist Meetings: Not on file  . Marital Status: Not on file  Intimate Partner Violence:   . Fear of Current or Ex-Partner: Not on file  . Emotionally Abused: Not on file  . Physically Abused: Not on file  . Sexually Abused: Not on file    Family History:    Family History  Problem Relation Age of Onset  . Heart disease Father 36       Heart attack  . Heart disease Sister 18       Heart attack  . Breast cancer Mother   . Colon cancer Neg Hx   . Stomach cancer Neg Hx   . Pancreatic cancer Neg Hx   . Rectal cancer Neg Hx   . Esophageal cancer Neg Hx      ROS:  Please see the history of present illness.   All other ROS reviewed and negative.     Physical Exam/Data:   Vitals:   05/22/20 1445 05/22/20 1500 05/22/20 1515 05/22/20 1530  BP: 92/61 (!) 80/57 102/70 (!) 78/58  Pulse: (!) 132 (!) 30 (!) 129 (!) 121  Resp: 10 10 11 11   Temp:      TempSrc:      SpO2: 96% 94% 96% 96%  Weight:      Height:        Intake/Output Summary (Last 24 hours) at 05/22/2020 1537 Last data filed at 05/22/2020 1339 Gross per 24 hour  Intake 2000 ml  Output 2 ml  Net 1998 ml   Last 3 Weights 05/22/2020 05/14/2020 05/13/2020  Weight (lbs) 214 lb 4.8 oz 230 lb 230 lb 3.2 oz  Weight (kg) 97.206 kg 104.327 kg 104.418 kg     VS:  BP (!) 78/58   Pulse (!) 121   Temp (!) 97.5 F (36.4 C)   Resp 11   Ht 5\' 8"  (1.727 m)   Wt 97.2 kg   SpO2 96%   BMI 32.58 kg/m  , BMI Body mass index is 32.58 kg/m. GENERAL:   Well appearing  HEENT: Pupils equal round and reactive,  Sclera icteric, oral mucosa unremarkable NECK:  No jugular venous distention, waveform within normal limits, carotid upstroke brisk and symmetric, no bruits LUNGS:  Clear to auscultation bilaterally HEART:  RRR.  PMI not displaced or sustained,S1 and S2 within normal limits, no S3, no S4, no clicks, no rubs, II/VI systolic murmur at the LUSB ABD:  Flat, positive bowel sounds normal in frequency in pitch, no bruits, no rebound, no guarding, no midline pulsatile mass, no hepatomegaly, no splenomegaly EXT:  2 plus pulses throughout, trace LE edema, no cyanosis no clubbing SKIN:  No rashes no nodules NEURO:  Cranial nerves II through XII grossly intact, motor grossly intact throughout PSYCH:  Cognitively intact, oriented to person place and time   EKG:  The EKG was personally reviewed and demonstrates: Atrial fibrillation. Rate 118 bpm. Cannot rule out prior inferior infarct. Telemetry:  Telemetry was personally reviewed and demonstrates: N/A  Relevant CV Studies:  Echo 11/10/18: IMPRESSIONS    1. The left ventricle has normal systolic function, with an ejection  fraction of 55-60%. The cavity size was normal. There is moderately  increased left ventricular wall thickness. Left ventricular diastolic  Doppler parameters are indeterminate.  2. The right ventricle has normal systolic function. The cavity was  normal. There is no increase in right ventricular wall thickness.  3. Left atrial size was moderately dilated.  4. Right atrial size was mildly dilated.  5. Mild thickening of the mitral valve leaflet. Mild calcification of the  mitral valve leaflet. There is mild mitral annular calcification present.  6. The aortic valve is tricuspid. Moderate thickening of the aortic  valve. Sclerosis without any evidence of stenosis of the aortic valve.   Laboratory Data:  High Sensitivity Troponin:   Recent Labs  Lab 05/22/20 1408    TROPONINIHS 4     Chemistry Recent Labs  Lab 05/22/20 0813  NA 139  K 3.9  CL 107  GLUCOSE 82  BUN 33*  CREATININE 1.90*    No results for input(s): PROT, ALBUMIN, AST, ALT, ALKPHOS, BILITOT in the last 168 hours. Hematology Recent Labs  Lab 05/22/20 0813  HGB 13.9  HCT 41.0   BNPNo results for input(s): BNP, PROBNP in the last 168 hours.  DDimer No results for input(s): DDIMER in the last 168 hours.   Radiology/Studies:  Portable chest 1 View  Result Date: 05/22/2020 CLINICAL DATA:  Chest pain. EXAM: PORTABLE CHEST 1 VIEW COMPARISON:  07/08/2017 FINDINGS: Low lung volumes are present, causing crowding of the pulmonary vasculature. Bandlike density favoring subsegmental atelectasis or scarring in the left lower lobe, retrocardiac position. The lungs appear otherwise clear. Atherosclerotic calcification of the aortic arch. No blunting of the costophrenic angles. IMPRESSION: 1. Bandlike density in the left lower lobe favoring subsegmental atelectasis or scarring. 2. Low lung volumes. 3. Atherosclerosis. Electronically Signed   By: Van Clines M.D.   On: 05/22/2020 14:43     Assessment and Plan:   # Atrial fibrillation with RVR:  Dillon Perkins remains in atrial fibrillation with rates in the 120s. Thyroid has been checked and has been stable. Potassium is mildly reduced. Recommend keeping it above 4. Blood pressure is low and has not responded to IV metoprolol or labetalol. We will give digoxin IV x3 doses. He is not on anticoagulation given his procedure today. Resume anticoagulation when stable per preprocedural team. Hold his home propranolol for now.  #Hypertension: #Hypotension: # Edema: Patient has a history of hypertension.  However lately it has been low and his home amlodipine has been on hold. He was started on furosemide which has helped his lower extremity edema. Given the also reports exertional dyspnea and has had volume overload, we will get an  echocardiogram to make sure there is no evidence of new onset heart failure. Echo was stable in 2020. His propranolol will be held for now as well. Holding spironolactone, though this is more for his liver disease than his blood pressure. Given his issues with hypotension and renal dysfunction, he may ultimately require a lower dose.  # Chest pressure: He seems to get chest pressure when his blood pressure is low. He also has exertional chest pressure at times. He has not had any kind of an ischemia evaluation. He would not be a great candidate for cath. However 1 to make sure there is no evidence of any high risk findings. We will plan for Lexiscan Myoview this admission once blood pressure and heart rate are better controlled.  # Hyperlipidemia:  Continue rosuvastatin.  # HCC: # Ureteral obstruction: Per Oncology and Urology.     For questions or updates, please contact [place] Please consult [email or web address] for contact info under    Signed, Skeet Latch, MD  05/22/2020 3:37 PM

## 2020-05-23 ENCOUNTER — Encounter (HOSPITAL_BASED_OUTPATIENT_CLINIC_OR_DEPARTMENT_OTHER): Payer: Self-pay | Admitting: Urology

## 2020-05-23 DIAGNOSIS — Z466 Encounter for fitting and adjustment of urinary device: Secondary | ICD-10-CM | POA: Diagnosis not present

## 2020-05-23 DIAGNOSIS — Z8249 Family history of ischemic heart disease and other diseases of the circulatory system: Secondary | ICD-10-CM | POA: Diagnosis not present

## 2020-05-23 DIAGNOSIS — Z9221 Personal history of antineoplastic chemotherapy: Secondary | ICD-10-CM | POA: Diagnosis not present

## 2020-05-23 DIAGNOSIS — E875 Hyperkalemia: Secondary | ICD-10-CM | POA: Diagnosis not present

## 2020-05-23 DIAGNOSIS — E785 Hyperlipidemia, unspecified: Secondary | ICD-10-CM | POA: Diagnosis present

## 2020-05-23 DIAGNOSIS — N2889 Other specified disorders of kidney and ureter: Secondary | ICD-10-CM | POA: Diagnosis not present

## 2020-05-23 DIAGNOSIS — Z87442 Personal history of urinary calculi: Secondary | ICD-10-CM | POA: Diagnosis not present

## 2020-05-23 DIAGNOSIS — I129 Hypertensive chronic kidney disease with stage 1 through stage 4 chronic kidney disease, or unspecified chronic kidney disease: Secondary | ICD-10-CM | POA: Diagnosis present

## 2020-05-23 DIAGNOSIS — N179 Acute kidney failure, unspecified: Secondary | ICD-10-CM | POA: Diagnosis present

## 2020-05-23 DIAGNOSIS — I8510 Secondary esophageal varices without bleeding: Secondary | ICD-10-CM | POA: Diagnosis present

## 2020-05-23 DIAGNOSIS — I482 Chronic atrial fibrillation, unspecified: Secondary | ICD-10-CM

## 2020-05-23 DIAGNOSIS — E8770 Fluid overload, unspecified: Secondary | ICD-10-CM | POA: Diagnosis present

## 2020-05-23 DIAGNOSIS — I959 Hypotension, unspecified: Secondary | ICD-10-CM | POA: Diagnosis not present

## 2020-05-23 DIAGNOSIS — N132 Hydronephrosis with renal and ureteral calculous obstruction: Secondary | ICD-10-CM | POA: Diagnosis present

## 2020-05-23 DIAGNOSIS — I4819 Other persistent atrial fibrillation: Secondary | ICD-10-CM | POA: Diagnosis present

## 2020-05-23 DIAGNOSIS — N200 Calculus of kidney: Secondary | ICD-10-CM | POA: Diagnosis not present

## 2020-05-23 DIAGNOSIS — I480 Paroxysmal atrial fibrillation: Secondary | ICD-10-CM | POA: Diagnosis not present

## 2020-05-23 DIAGNOSIS — R079 Chest pain, unspecified: Secondary | ICD-10-CM | POA: Diagnosis not present

## 2020-05-23 DIAGNOSIS — E669 Obesity, unspecified: Secondary | ICD-10-CM | POA: Diagnosis present

## 2020-05-23 DIAGNOSIS — I9581 Postprocedural hypotension: Secondary | ICD-10-CM | POA: Diagnosis not present

## 2020-05-23 DIAGNOSIS — N184 Chronic kidney disease, stage 4 (severe): Secondary | ICD-10-CM | POA: Diagnosis present

## 2020-05-23 DIAGNOSIS — K219 Gastro-esophageal reflux disease without esophagitis: Secondary | ICD-10-CM | POA: Diagnosis present

## 2020-05-23 DIAGNOSIS — C220 Liver cell carcinoma: Secondary | ICD-10-CM | POA: Diagnosis present

## 2020-05-23 DIAGNOSIS — Z6832 Body mass index (BMI) 32.0-32.9, adult: Secondary | ICD-10-CM | POA: Diagnosis not present

## 2020-05-23 DIAGNOSIS — K315 Obstruction of duodenum: Secondary | ICD-10-CM | POA: Diagnosis not present

## 2020-05-23 DIAGNOSIS — Z87891 Personal history of nicotine dependence: Secondary | ICD-10-CM | POA: Diagnosis not present

## 2020-05-23 DIAGNOSIS — R000 Tachycardia, unspecified: Secondary | ICD-10-CM | POA: Diagnosis not present

## 2020-05-23 DIAGNOSIS — R0789 Other chest pain: Secondary | ICD-10-CM | POA: Diagnosis present

## 2020-05-23 DIAGNOSIS — K7031 Alcoholic cirrhosis of liver with ascites: Secondary | ICD-10-CM | POA: Diagnosis present

## 2020-05-23 DIAGNOSIS — Z79899 Other long term (current) drug therapy: Secondary | ICD-10-CM | POA: Diagnosis not present

## 2020-05-23 LAB — CBC
HCT: 39.7 % (ref 39.0–52.0)
Hemoglobin: 12.6 g/dL — ABNORMAL LOW (ref 13.0–17.0)
MCH: 35.2 pg — ABNORMAL HIGH (ref 26.0–34.0)
MCHC: 31.7 g/dL (ref 30.0–36.0)
MCV: 110.9 fL — ABNORMAL HIGH (ref 80.0–100.0)
Platelets: 125 10*3/uL — ABNORMAL LOW (ref 150–400)
RBC: 3.58 MIL/uL — ABNORMAL LOW (ref 4.22–5.81)
RDW: 14.8 % (ref 11.5–15.5)
WBC: 8.1 10*3/uL (ref 4.0–10.5)
nRBC: 0 % (ref 0.0–0.2)

## 2020-05-23 LAB — BASIC METABOLIC PANEL
Anion gap: 13 (ref 5–15)
Anion gap: 10 (ref 5–15)
BUN: 41 mg/dL — ABNORMAL HIGH (ref 8–23)
BUN: 48 mg/dL — ABNORMAL HIGH (ref 8–23)
CO2: 21 mmol/L — ABNORMAL LOW (ref 22–32)
CO2: 24 mmol/L (ref 22–32)
Calcium: 8.2 mg/dL — ABNORMAL LOW (ref 8.9–10.3)
Calcium: 8.4 mg/dL — ABNORMAL LOW (ref 8.9–10.3)
Chloride: 102 mmol/L (ref 98–111)
Chloride: 100 mmol/L (ref 98–111)
Creatinine, Ser: 3.06 mg/dL — ABNORMAL HIGH (ref 0.61–1.24)
Creatinine, Ser: 3.22 mg/dL — ABNORMAL HIGH (ref 0.61–1.24)
GFR, Estimated: 21 mL/min — ABNORMAL LOW (ref >60)
GFR, Estimated: 20 mL/min — ABNORMAL LOW (ref >60)
Glucose, Bld: 142 mg/dL — ABNORMAL HIGH (ref 70–99)
Glucose, Bld: 139 mg/dL — ABNORMAL HIGH (ref 70–99)
Potassium: 5.5 mmol/L — ABNORMAL HIGH (ref 3.5–5.1)
Potassium: 5.1 mmol/L (ref 3.5–5.1)
Sodium: 136 mmol/L (ref 135–145)
Sodium: 134 mmol/L — ABNORMAL LOW (ref 135–145)

## 2020-05-23 LAB — PROTIME-INR
INR: 1.5 — ABNORMAL HIGH (ref 0.8–1.2)
Prothrombin Time: 17.5 seconds — ABNORMAL HIGH (ref 11.4–15.2)

## 2020-05-23 MED ORDER — PANTOPRAZOLE SODIUM 40 MG PO TBEC
40.0000 mg | DELAYED_RELEASE_TABLET | Freq: Every day | ORAL | Status: DC
  Administered 2020-05-23 – 2020-05-24 (×2): 40 mg via ORAL
  Filled 2020-05-23 (×2): qty 1

## 2020-05-23 MED ORDER — OXYBUTYNIN CHLORIDE 5 MG PO TABS: 5.0000 mg | ORAL_TABLET | Freq: Three times a day (TID) | ORAL | Status: DC | PRN

## 2020-05-23 MED ORDER — ONDANSETRON HCL 4 MG/2ML IJ SOLN: 4.0000 mg | INTRAMUSCULAR | Status: DC | PRN

## 2020-05-23 MED ORDER — MORPHINE SULFATE (PF) 2 MG/ML IV SOLN: 2.0000 mg | INTRAVENOUS | Status: DC | PRN

## 2020-05-23 MED ORDER — COQ10 100 MG PO CAPS: ORAL_CAPSULE | Freq: Every day | ORAL | Status: DC

## 2020-05-23 MED ORDER — SODIUM CHLORIDE 0.9 % IV SOLN: INTRAVENOUS | Status: DC

## 2020-05-23 MED ORDER — TAMSULOSIN HCL 0.4 MG PO CAPS
0.4000 mg | ORAL_CAPSULE | Freq: Every day | ORAL | Status: DC
  Administered 2020-05-23: 0.4 mg via ORAL
  Filled 2020-05-23: qty 1

## 2020-05-23 MED ORDER — DIPHENHYDRAMINE HCL 50 MG/ML IJ SOLN: 12.5000 mg | Freq: Four times a day (QID) | INTRAMUSCULAR | Status: DC | PRN

## 2020-05-23 MED ORDER — BELLADONNA ALKALOIDS-OPIUM 16.2-60 MG RE SUPP: 1.0000 | Freq: Four times a day (QID) | RECTAL | Status: DC | PRN

## 2020-05-23 MED ORDER — HYDROCODONE-ACETAMINOPHEN 5-325 MG PO TABS: 1.0000 | ORAL_TABLET | ORAL | Status: DC | PRN

## 2020-05-23 MED ORDER — DIPHENHYDRAMINE HCL 12.5 MG/5ML PO ELIX: 12.5000 mg | ORAL_SOLUTION | Freq: Four times a day (QID) | ORAL | Status: DC | PRN

## 2020-05-23 NOTE — Progress Notes (Signed)
    Subjective:  Doing much better. BP up  HR down Lots of questions from wife Dillon Perkins regarding his meds Still with hematuria and getting I/O cath  Objective:  Vitals:   05/23/20 1100 05/23/20 1200 05/23/20 1300 05/23/20 1400  BP: 115/65 123/79 (!) 120/93 (!) 146/78  Pulse: 60 (!) 105 (!) 127 73  Resp: 20 12 19 13   Temp:  (!) 97.5 F (36.4 C)    TempSrc:  Oral    SpO2: 97% 96% 92% 94%  Weight:      Height:        Intake/Output from previous day:  Intake/Output Summary (Last 24 hours) at 05/23/2020 1601 Last data filed at 05/23/2020 1200 Gross per 24 hour  Intake 1899.84 ml  Output 410 ml  Net 1489.84 ml    Physical Exam: No distress Sitting in chair Lungs clear Plus one edema With compression devices on  Lab Results: Basic Metabolic Panel: Recent Labs    05/23/20 0226 05/23/20 0853  NA 136 134*  K 5.5* 5.1  CL 102 100  CO2 21* 24  GLUCOSE 142* 139*  BUN 41* 48*  CREATININE 3.06* 3.22*  CALCIUM 8.2* 8.4*   CBC: Recent Labs    05/22/20 0813 05/23/20 0226  WBC  --  8.1  HGB 13.9 12.6*  HCT 41.0 39.7  MCV  --  110.9*  PLT  --  125*   Cardiac Enzymes:  Imaging: Portable chest 1 View  Result Date: 05/22/2020 CLINICAL DATA:  Chest pain. EXAM: PORTABLE CHEST 1 VIEW COMPARISON:  07/08/2017 FINDINGS: Low lung volumes are present, causing crowding of the pulmonary vasculature. Bandlike density favoring subsegmental atelectasis or scarring in the left lower lobe, retrocardiac position. The lungs appear otherwise clear. Atherosclerotic calcification of the aortic arch. No blunting of the costophrenic angles. IMPRESSION: 1. Bandlike density in the left lower lobe favoring subsegmental atelectasis or scarring. 2. Low lung volumes. 3. Atherosclerosis. Electronically Signed   By: Van Clines M.D.   On: 05/22/2020 14:43    Cardiac Studies:  ECG:   Telemetry:  afib rates 60-70's   Echo: 11/10/18 EF normal   Medications:   . Chlorhexidine Gluconate  Cloth  6 each Topical Daily  . mouth rinse  15 mL Mouth Rinse BID  . pantoprazole  40 mg Oral Daily  . tamsulosin  0.4 mg Oral QHS     . sodium chloride 75 mL/hr at 05/23/20 1422  . dextrose 5 % and 0.45% NaCl 75 mL/hr at 05/23/20 1200    Assessment/Plan:   1. Afib:  He has had iv digoxin and beta blocker rate low now In am consider resuming home inderal 80 mg or 1/2 dose if HR low and BP low He is on this for his cirrhosis as well. Dr Cherlyn Cushing note indicated "he wouild discuss anticoagulation"  CHADVASC 2 I would not start given cirrhosis with low PLT count. Has had chemo and now on immunoRx that has lowered counts. Still having hematuria  2. Hypotension:  Improved would stop hydrating soon as he is getting volume overloaded Unfortunately his Cr has gone from baseline 1.8 to over 3 ? From hydronephrosis and obstruction May need renal involvement but would consider lasix for his cirrhosis and volume in am if Cr ok  Should make sure his wife Dillon Perkins is involved with care and spoken to daily as she remembers things   [name] 05/23/2020, 4:01 PM

## 2020-05-23 NOTE — Progress Notes (Addendum)
1 Day Post-Op Subjective: Feeling better this AM.  BP and HR stabilized overnight.  No UOP yet, but he states that he feels the urge to void.  Denies flank pain, nausea/vomiting or chills.  AF overnight.    Objective: Vital signs in last 24 hours: Temp:  [96.9 F (36.1 C)-98 F (36.7 C)] 98 F (36.7 C) (11/18 0617) Pulse Rate:  [30-137] 74 (11/18 0700) Resp:  [8-22] 17 (11/18 0700) BP: (59-125)/(39-102) 118/85 (11/18 0700) SpO2:  [91 %-99 %] 98 % (11/18 0700) Weight:  [97.2 kg] 97.2 kg (11/17 0756)  Intake/Output from previous day: 11/17 0701 - 11/18 0700 In: 3370.1 [I.V.:3370.1] Out: 2 [Blood:2]  Intake/Output this shift: No intake/output data recorded.  Physical Exam:  General: Alert and oriented  Lab Results: Recent Labs    05/22/20 0813 05/23/20 0226  HGB 13.9 12.6*  HCT 41.0 39.7   BMET Recent Labs    05/22/20 0813 05/23/20 0226  NA 139 136  K 3.9 5.5*  CL 107 102  CO2  --  21*  GLUCOSE 82 142*  BUN 33* 41*  CREATININE 1.90* 3.06*  CALCIUM  --  8.2*     Studies/Results: Portable chest 1 View  Result Date: 05/22/2020 CLINICAL DATA:  Chest pain. EXAM: PORTABLE CHEST 1 VIEW COMPARISON:  07/08/2017 FINDINGS: Low lung volumes are present, causing crowding of the pulmonary vasculature. Bandlike density favoring subsegmental atelectasis or scarring in the left lower lobe, retrocardiac position. The lungs appear otherwise clear. Atherosclerotic calcification of the aortic arch. No blunting of the costophrenic angles. IMPRESSION: 1. Bandlike density in the left lower lobe favoring subsegmental atelectasis or scarring. 2. Low lung volumes. 3. Atherosclerosis. Electronically Signed   By: Van Clines M.D.   On: 05/22/2020 14:43    Assessment/Plan: 68 yM with hypotension following bilateral ureteroscopy for bilateral renal/ureteral calculi.  PMHx of hepatocellular carcinoma, afib, HTN, CKD IV.  -continue to monitor BP and HR--cardiology  following--appreciate assistance with this complex patient -voiding trial this AM--if unable to void, will in/out cath--continue tamsulosin -continue to monitor renal function and hyperkalemia--currently on NS at 75 mL/hr -hopefully TTF later today   LOS: 0 days   Ellison Hughs, MD [facility] Pager: [phone]  05/23/2020, 7:56 AM

## 2020-05-23 NOTE — Progress Notes (Signed)
Pt has void x2, bloody, but only 25ml. Bladder scan attempted due to pt large, distended abdomen bladder is detected with obvious fluid in it, but unable to scan volume. Called and spoke with Dr. Lovena Neighbours. He would like In & out cath every 6 hours if pt is unable to void. He would like 15F used to allow sediment and clots to pass through from surgery. See orders.

## 2020-05-23 NOTE — TOC Initial Note (Signed)
Transition of Care [facility]) - Initial/Assessment Note    Patient Details  Name: SHAIDEN ALDOUS MRN: [id number] Date of Birth: 1950-08-28  Transition of Care [facility]) CM/SW Contact:    Leeroy Cha, RN Phone Number: 05/23/2020, 7:40 AM  Clinical Narrative:                 Procedure(s)  [id number] Performed: CYSTOSCOPY/RETROGRADE/URETEROSCOPY/HOLMIUM LASER/STENT PLACEMENT STONE BASKET RETRIVAL  (Bilateral ) 69 year old male with a past medical history of hepatocellular carcinoma (s/p Y90 radioembolization with plans to start immunotherapy), kidney stones, microscopic hematuria, atrial fibrillation, HTN, CKD stage III, HLD and obesity.   Cardiologist: Minus Breeding, MD   -He is planning to receive [place] starting October 4th.   -Mr. Heber Altamont is here today after he was noted to have a small right UPJ stone with mild hydronephrosis on recent surveillance MRI. Today, he denies right flank pain, dysuria, hematuria, nausea/vomiting or fever/chills. He has not seen a stone in his urine.  [place] at 2l/min, iv d51/2 ns at 75cc/hr, Plan is to return to home Following for progression Expected Discharge Plan: Home/Self Care Barriers to Discharge: No Barriers Identified   Patient Goals and CMS Choice Patient states their goals for this hospitalization and ongoing recovery are:: to go home CMS Medicare.gov Compare Post Acute Care list provided to:: Patient    Expected Discharge Plan and Services Expected Discharge Plan: Home/Self Care   Discharge Planning Services: CM Consult   Living arrangements for the past 2 months: Single Family Home Expected Discharge Date: 05/22/20                                    Prior Living Arrangements/Services Living arrangements for the past 2 months: Single Family Home Lives with:: Spouse Patient language and need for interpreter reviewed:: Yes Do you feel safe going back to the place where you live?: Yes      Need for Family Participation  in Patient Care: Yes (Comment) Care giver support system in place?: Yes (comment)   Criminal Activity/Legal Involvement Pertinent to Current Situation/Hospitalization: No - Comment as needed  Activities of Daily Living Home Assistive Devices/Equipment: Eyeglasses, Blood pressure cuff ADL Screening (condition at time of admission) Patient's cognitive ability adequate to safely complete daily activities?: Yes Is the patient deaf or have difficulty hearing?: No Does the patient have difficulty seeing, even when wearing glasses/contacts?: Yes Does the patient have difficulty concentrating, remembering, or making decisions?: No Patient able to express need for assistance with ADLs?: No Does the patient have difficulty dressing or bathing?: No Independently performs ADLs?: Yes (appropriate for developmental age) Does the patient have difficulty walking or climbing stairs?: Yes Weakness of Legs: None Weakness of Arms/Hands: None  Permission Sought/Granted                  Emotional Assessment Appearance:: Appears stated age Attitude/Demeanor/Rapport: Engaged Affect (typically observed): Calm Orientation: : Oriented to Place, Oriented to Self, Oriented to  Time, Oriented to Situation Alcohol / Substance Use: Not Applicable Psych Involvement: No (comment)  Admission diagnosis:  Kidney stones [N20.0] Chest pain [R07.9] Patient Active Problem List   Diagnosis Date Noted  . Kidney stones 05/22/2020  . Hepatocellular carcinoma ([place]) 03/26/2020  . Goals of care, counseling/discussion 03/26/2020  . Educated about COVID-19 virus infection 11/07/2018  . Dilated cardiomyopathy ([place]) 11/07/2018  . SOB (shortness of breath) 11/07/2018  . Chest pain   . Acute  renal insufficiency   . Paroxysmal atrial fibrillation ([facility])   . Bradycardia 07/22/2014  . Exercise intolerance 09/24/2012  . Abnormal CXR 09/06/2012  . Abnormal LFTs (liver function tests) 02/07/2012  . Hyperlipidemia 02/07/2012   . Atrial fibrillation ([place]) 01/31/2012  . Snoring 01/31/2012  . Essential hypertension 08/29/2010   PCP:  Antony Contras, MD Pharmacy:   [facility] [address] [place] [address] Phone: [phone] Fax: [phone]     Social Determinants of Health (SDOH) Interventions    Readmission Risk Interventions No flowsheet data found.

## 2020-05-24 ENCOUNTER — Inpatient Hospital Stay (HOSPITAL_COMMUNITY): Payer: Medicare Other

## 2020-05-24 DIAGNOSIS — I480 Paroxysmal atrial fibrillation: Secondary | ICD-10-CM

## 2020-05-24 DIAGNOSIS — R079 Chest pain, unspecified: Secondary | ICD-10-CM | POA: Diagnosis not present

## 2020-05-24 LAB — CBC
HCT: 35.4 % — ABNORMAL LOW (ref 39.0–52.0)
Hemoglobin: 11.5 g/dL — ABNORMAL LOW (ref 13.0–17.0)
MCH: 35.6 pg — ABNORMAL HIGH (ref 26.0–34.0)
MCHC: 32.5 g/dL (ref 30.0–36.0)
MCV: 109.6 fL — ABNORMAL HIGH (ref 80.0–100.0)
Platelets: 119 10*3/uL — ABNORMAL LOW (ref 150–400)
RBC: 3.23 MIL/uL — ABNORMAL LOW (ref 4.22–5.81)
RDW: 14.6 % (ref 11.5–15.5)
WBC: 9.7 10*3/uL (ref 4.0–10.5)
nRBC: 0 % (ref 0.0–0.2)

## 2020-05-24 LAB — BASIC METABOLIC PANEL WITH GFR
Anion gap: 10 (ref 5–15)
BUN: 51 mg/dL — ABNORMAL HIGH (ref 8–23)
CO2: 22 mmol/L (ref 22–32)
Calcium: 8 mg/dL — ABNORMAL LOW (ref 8.9–10.3)
Chloride: 101 mmol/L (ref 98–111)
Creatinine, Ser: 3.12 mg/dL — ABNORMAL HIGH (ref 0.61–1.24)
GFR, Estimated: 21 mL/min — ABNORMAL LOW
Glucose, Bld: 123 mg/dL — ABNORMAL HIGH (ref 70–99)
Potassium: 4.1 mmol/L (ref 3.5–5.1)
Sodium: 133 mmol/L — ABNORMAL LOW (ref 135–145)

## 2020-05-24 MED ORDER — SPIRONOLACTONE 50 MG PO TABS: 25.0000 mg | ORAL_TABLET | Freq: Every day | ORAL | 3 refills | Status: DC

## 2020-05-24 MED ORDER — FUROSEMIDE 20 MG PO TABS: 20.0000 mg | ORAL_TABLET | Freq: Every day | ORAL | 11 refills | Status: DC | PRN

## 2020-05-24 NOTE — Progress Notes (Signed)
Patient has orders to discharge from Dr. Lovena Neighbours. I have asked Cecilie Kicks, NP from cardiology to help assist and verify which cardiac medications patient would need to be discharged on. Awaiting to hear back from her.

## 2020-05-24 NOTE — Discharge Summary (Signed)
Date of admission: 05/22/2020  Date of discharge: 05/24/2020  Admission diagnosis:  1.  Bilateral renal stones and obstructing right mid ureteral calculus 2.  History of hepatocellular carcinoma 3.  Postoperative hypotension and tachycardia--likely secondary to polypharmacy 4.  History of atrial fibrillation 5.  History of stage III chronic kidney disease  Discharge diagnosis:  1.  Bilateral renal stones and obstructing right mid ureteral calculus 2.  History of hepatocellular carcinoma 3.  Postoperative hypotension and tachycardia--likely secondary to polypharmacy 4.  History of atrial fibrillation 5.  Acute on chronic renal failure  Procedures: Cystoscopy with bilateral ureteroscopy, holmium laser lithotripsy and bilateral JJ stent placement  History and Physical: For full details, please see admission history and physical. Briefly, Dillon Perkins is a 69 y.o. year old patient with multiple medical comorbidities.  He underwent cystoscopy with bilateral ureteroscopy and holmium laser lithotripsy due to numerous renal and ureteral calculi on 05/22/2020.    Hospital Course: Postoperatively, the patient developed significant hypotension and tachycardia.  The patient remained hypotensive despite multiple fluid boluses and vasopressors.  The patient remained afebrile and showed no signs of sepsis during the postoperative course.  The patient was admitted to the intensive care unit and all blood pressure medications were subsequently discontinued.  His blood pressure quickly rebounded upon discontinuation of his blood pressure medications.  A cardiac enzyme panel and EKG showed no evidence of acute myocardial infarction.  The patient's serum creatinine was elevated above his baseline while in the hospital, despite IV fluid resuscitation.  He required in and out catheterization 2 times while in the intensive care unit, but was voiding without difficulty upon discharge.  A KUB was checked on  05/24/2020 showed that his stents were in good position.  His total urine output the day of discharge was approximately 1700 mL and his serum creatinine improved to 3.12.  His acute on chronic renal failure was likely secondary to prolonged hypotension.  He remained afebrile and showed no evidence of a leukocytosis while in the hospital.  The patient was then discharged home with plans for close follow-up in my office to recheck a metabolic panel and for stent removal on 05/29/2020 as well as close follow-up with his cardiologist to reassess his blood pressure regimen.   Physical Exam:  General: Alert and oriented CV: RRR, palpable distal pulses Lungs: CTAB, equal chest rise Abdomen: Soft, NTND, no rebound or guarding Ext: NT, No erythema  Laboratory values: Recent Labs    05/22/20 0813 05/23/20 0226 05/24/20 0243  HGB 13.9 12.6* 11.5*  HCT 41.0 39.7 35.4*   Recent Labs    05/23/20 0853 05/24/20 0947  CREATININE 3.22* 3.12*    Disposition: Home  Discharge instruction: The patient was instructed to be ambulatory but told to refrain from heavy lifting, strenuous activity, or driving.  Discharge medications:  Allergies as of 05/24/2020   No Known Allergies     Medication List    STOP taking these medications   amLODipine 10 MG tablet Commonly known as: NORVASC   propranolol ER 80 MG 24 hr capsule Commonly known as: Inderal LA     TAKE these medications   ADVIL PM PO Take by mouth at bedtime as needed.   cephALEXin 500 MG capsule Commonly known as: KEFLEX Take 1 capsule (500 mg total) by mouth 2 (two) times daily for 3 days. Start taking on 05/28/2020   CoQ10 100 MG Caps Take by mouth daily.   furosemide 20 MG tablet Commonly known as: Lasix  Take 1 tablet (20 mg total) by mouth daily as needed (only take if weight is up 2 pounds in a day or 5 pounds in a week). What changed:   when to take this  reasons to take this   HYDROcodone-acetaminophen 5-325 MG  tablet Commonly known as: Norco Take 1 tablet by mouth every 4 (four) hours as needed for moderate pain.   ondansetron 4 MG tablet Commonly known as: Zofran Take 1 tablet (4 mg total) by mouth daily as needed for nausea or vomiting.   oxybutynin 10 MG 24 hr tablet Commonly known as: Ditropan XL Take 1 tablet (10 mg total) by mouth daily.   pantoprazole 40 MG tablet Commonly known as: PROTONIX Take 1 tablet (40 mg total) by mouth daily. What changed: when to take this   phenazopyridine 200 MG tablet Commonly known as: Pyridium Take 1 tablet (200 mg total) by mouth 3 (three) times daily as needed (for pain with urination).   pravastatin 40 MG tablet Commonly known as: PRAVACHOL Take 1 tablet by mouth at bedtime.   spironolactone 50 MG tablet Commonly known as: Aldactone Take 0.5 tablets (25 mg total) by mouth daily. What changed: how much to take   tamsulosin 0.4 MG Caps capsule Commonly known as: FLOMAX Take 0.4 mg by mouth at bedtime.       Followup:   Follow-up Information    [facility] On 05/29/2020.   Why: Follow-up at 8 AM for stent removal in the office Contact information: [address] [place] On 05/27/2020.   Why: Lab visit only for bloodwork to recheck kidney function.  Contact information: [place]       Minus Breeding, MD. Go on 05/28/2020.   Specialty: Cardiology Why: at 2:20 pm with Dr. Gardiner Rhyme --Dr. Rosezella [place] partner Contact information: [address] [place] [address] [phone]

## 2020-05-24 NOTE — Discharge Instructions (Signed)
Cardiology stopped your amlodipine,  Hold your inderal (propranolol) and hold lasix unless weight is up 2 pounds in a day or 5 pounds in a week.   We decreased your spironolactone for now as well to 25 mg daily.   Please weigh daily and keep a log along with BP and heart rate and bring with you to appointment.      Ureteral Stent Implantation, Care After This sheet gives you information about how to care for yourself after your procedure. Your health care provider may also give you more specific instructions. If you have problems or questions, contact your health care provider. What can I expect after the procedure? After the procedure, it is common to have:  Nausea.  Mild pain when you urinate. You may feel this pain in your lower back or lower abdomen. The pain should stop within a few minutes after you urinate. This may last for up to 1 week.  A small amount of blood in your urine for several days. Follow these instructions at home: Medicines  Take over-the-counter and prescription medicines only as told by your health care provider.  If you were prescribed an antibiotic medicine, take it as told by your health care provider. Do not stop taking the antibiotic even if you start to feel better.  Do not drive for 24 hours if you were given a sedative during your procedure.  Ask your health care provider if the medicine prescribed to you requires you to avoid driving or using heavy machinery. Activity  Rest as told by your health care provider.  Avoid sitting for a long time without moving. Get up to take short walks every 1-2 hours. This is important to improve blood flow and breathing. Ask for help if you feel weak or unsteady.  Return to your normal activities as told by your health care provider. Ask your health care provider what activities are safe for you. General instructions   Watch for any blood in your urine. Call your health care provider if the amount of blood in  your urine increases.  If you have a catheter: ? Follow instructions from your health care provider about taking care of your catheter and collection bag. ? Do not take baths, swim, or use a hot tub until your health care provider approves. Ask your health care provider if you may take showers. You may only be allowed to take sponge baths.  Drink enough fluid to keep your urine pale yellow.  Do not use any products that contain nicotine or tobacco, such as cigarettes, e-cigarettes, and chewing tobacco. These can delay healing after surgery. If you need help quitting, ask your health care provider.  Keep all follow-up visits as told by your health care provider. This is important. Contact a health care provider if:  You have pain that gets worse or does not get better with medicine, especially pain when you urinate.  You have difficulty urinating.  You feel nauseous or you vomit repeatedly during a period of more than 2 days after the procedure. Get help right away if:  Your urine is dark red or has blood clots in it.  You are leaking urine (have incontinence).  The end of the stent comes out of your urethra.  You cannot urinate.  You have sudden, sharp, or severe pain in your abdomen or lower back.  You have a fever.  You have swelling or pain in your legs.  You have difficulty breathing. Summary  After the  procedure, it is common to have mild pain when you urinate that goes away within a few minutes after you urinate. This may last for up to 1 week.  Watch for any blood in your urine. Call your health care provider if the amount of blood in your urine increases.  Take over-the-counter and prescription medicines only as told by your health care provider.  Drink enough fluid to keep your urine pale yellow. This information is not intended to replace advice given to you by your health care provider. Make sure you discuss any questions you have with your health care  provider. Document Revised: 03/29/2018 Document Reviewed: 03/30/2018 Elsevier Patient Education  [place] Instructions  Activity: Get plenty of rest for the remainder of the day. A responsible individual must stay with you for 24 hours following the procedure.  For the next 24 hours, DO NOT: -Drive a car -Paediatric nurse -Drink alcoholic beverages -Take any medication unless instructed by your physician -Make any legal decisions or sign important papers.  Meals: Start with liquid foods such as gelatin or soup. Progress to regular foods as tolerated. Avoid greasy, spicy, heavy foods. If nausea and/or vomiting occur, drink only clear liquids until the nausea and/or vomiting subsides. Call your physician if vomiting continues.  Special Instructions/Symptoms: Your throat may feel dry or sore from the anesthesia or the breathing tube placed in your throat during surgery. If this causes discomfort, gargle with warm salt water. The discomfort should disappear within 24 hours.  No ibuprofen, Advil, Aleve, Motrin, or naproxen until after 5:30 pm today if needed.

## 2020-05-24 NOTE — Progress Notes (Signed)
Progress Note  Patient Name: Dillon Perkins Date of Encounter: 05/24/2020  [facility] Cardiologist: Minus Breeding, MD   Subjective   No chest pain   Inpatient Medications    Scheduled Meds: . Chlorhexidine Gluconate Cloth  6 each Topical Daily  . mouth rinse  15 mL Mouth Rinse BID  . pantoprazole  40 mg Oral Daily  . tamsulosin  0.4 mg Oral QHS   Continuous Infusions:  PRN Meds: diphenhydrAMINE **OR** diphenhydrAMINE, HYDROcodone-acetaminophen, morphine injection, ondansetron, opium-belladonna, oxybutynin   Vital Signs    Vitals:   05/24/20 0500 05/24/20 0600 05/24/20 0700 05/24/20 0800  BP: 116/71 126/82 120/64 (!) 90/55  Pulse: (!) 58 (!) 38 60 (!) 37  Resp: (!) 8 14 10 19   Temp:      TempSrc:      SpO2: 96% 97% 97% 96%  Weight:      Height:        Intake/Output Summary (Last 24 hours) at 05/24/2020 0901 Last data filed at 05/24/2020 0800 Gross per 24 hour  Intake 2524.25 ml  Output 2010 ml  Net 514.25 ml   Last 3 Weights 05/22/2020 05/14/2020 05/13/2020  Weight (lbs) 214 lb 4.8 oz 230 lb 230 lb 3.2 oz  Weight (kg) 97.206 kg 104.327 kg 104.418 kg      Telemetry    Atrial fib rate controlled - Personally Reviewed  ECG    No new - Personally Reviewed  Physical Exam  EXAM per Dr. Rodman Pickle    High Sensitivity Troponin:   Recent Labs  Lab 05/22/20 1408  TROPONINIHS 4      Chemistry Recent Labs  Lab 05/22/20 0813 05/23/20 0226 05/23/20 0853  NA 139 136 134*  K 3.9 5.5* 5.1  CL 107 102 100  CO2  --  21* 24  GLUCOSE 82 142* 139*  BUN 33* 41* 48*  CREATININE 1.90* 3.06* 3.22*  CALCIUM  --  8.2* 8.4*  GFRNONAA  --  21* 20*  ANIONGAP  --  13 10     Hematology Recent Labs  Lab 05/22/20 0813 05/23/20 0226 05/24/20 0243  WBC  --  8.1 9.7  RBC  --  3.58* 3.23*  HGB 13.9 12.6* 11.5*  HCT 41.0 39.7 35.4*  MCV  --  110.9* 109.6*  MCH  --  35.2* 35.6*  MCHC  --  31.7 32.5  RDW  --  14.8 14.6  PLT  --  125* 119*      BNPNo results for input(s): BNP, PROBNP in the last 168 hours.   DDimer No results for input(s): DDIMER in the last 168 hours.   Radiology    Portable chest 1 View  Result Date: 05/22/2020 CLINICAL DATA:  Chest pain. EXAM: PORTABLE CHEST 1 VIEW COMPARISON:  07/08/2017 FINDINGS: Low lung volumes are present, causing crowding of the pulmonary vasculature. Bandlike density favoring subsegmental atelectasis or scarring in the left lower lobe, retrocardiac position. The lungs appear otherwise clear. Atherosclerotic calcification of the aortic arch. No blunting of the costophrenic angles. IMPRESSION: 1. Bandlike density in the left lower lobe favoring subsegmental atelectasis or scarring. 2. Low lung volumes. 3. Atherosclerosis. Electronically Signed   By: Van Clines M.D.   On: 05/22/2020 14:43    Cardiac Studies   Echo 11/10/18: IMPRESSIONS    1. The left ventricle has normal systolic function, with an ejection  fraction of 55-60%. The cavity size was normal. There is moderately  increased left ventricular wall  thickness. Left ventricular diastolic  Doppler parameters are indeterminate.  2. The right ventricle has normal systolic function. The cavity was  normal. There is no increase in right ventricular wall thickness.  3. Left atrial size was moderately dilated.  4. Right atrial size was mildly dilated.  5. Mild thickening of the mitral valve leaflet. Mild calcification of the  mitral valve leaflet. There is mild mitral annular calcification present.  6. The aortic valve is tricuspid. Moderate thickening of the aortic  valve. Sclerosis without any evidence of stenosis of the aortic valve  Patient Profile     69 y.o. male with a hx of hepatocellular carcinoma, cirrhosis/esophageal varices, prior EtOH/tobacco abuse, persistent atrial fibrillation, CKD IV, hypertension, and hyperlipidemia seen at surgical center where he was having cystoscopy with bilateral ureteroscopy,  laser lithotripsy, and bilateral JJ stent placement  and transferred to [facility] ICU for rapid atrial fib.    Assessment & Plan    1. Afib:  He has had iv digoxin and beta blocker rate low now In am consider resuming home inderal 80 mg or 1/2 dose if HR low and BP low He is on this for his cirrhosis as well. Dr Cherlyn Cushing note indicated "he wouild discuss anticoagulation"  CHADVASC 2 per Dr. Johnsie Cancel would not start given cirrhosis with low PLT count. Has had chemo and now on immunoRx that has lowered counts. Still having hematuria  Today plts 119 Hgb 11.5  INR at 1.5  Normal TSH  Normal hs troponin at 4.   I&0 +4112    2. Hypotension:  Improved would stop hydrating soon as he is getting volume overloaded Unfortunately his Cr has gone from baseline 1.8 to over 3 ? From hydronephrosis and obstruction May need renal involvement but would consider lasix for his cirrhosis and volume in am if Cr ok  3.  Borderline hyperkalemia at 5.1 Cr 3.22  Yesterday no BMP today   4.  Chest pressure with neg troponin, possible lexiscan myoview this admit once BP and HR stable.        For questions or updates, please contact [place] Please consult [email or web address] for contact info under        Signed, Cecilie Kicks, NP  05/24/2020, 9:01 AM

## 2020-05-24 NOTE — Progress Notes (Signed)
2 Days Post-Op Subjective: Feeling much better.  BP and HR stable/improving.  He was able to void 3 times last night and report some urgency/frequency, but feels like he is emptying his bladder well.  UOP more than adequate.  Serum creatinine slightly higher this AM. Potassium levels improving.  Denies flank pain or nausea/vomiting.  No floor beds available.    Objective: Vital signs in last 24 hours: Temp:  [97.5 F (36.4 C)-98.1 F (36.7 C)] 98.1 F (36.7 C) (11/19 0100) Pulse Rate:  [37-127] 37 (11/19 0800) Resp:  [8-22] 19 (11/19 0800) BP: (90-146)/(55-93) 90/55 (11/19 0800) SpO2:  [92 %-98 %] 96 % (11/19 0800)  Intake/Output from previous day: 11/18 0701 - 11/19 0700 In: 2614.3 [P.O.:960; I.V.:1654.3] Out: 1770 [Urine:1770]  Intake/Output this shift: Total I/O In: 150 [P.O.:150] Out: 250 [Urine:250]  Physical Exam:  General: Alert and oriented  Lab Results: Recent Labs    05/22/20 0813 05/23/20 0226 05/24/20 0243  HGB 13.9 12.6* 11.5*  HCT 41.0 39.7 35.4*   BMET Recent Labs    05/23/20 0226 05/23/20 0853  NA 136 134*  K 5.5* 5.1  CL 102 100  CO2 21* 24  GLUCOSE 142* 139*  BUN 41* 48*  CREATININE 3.06* 3.22*  CALCIUM 8.2* 8.4*     Studies/Results: Portable chest 1 View  Result Date: 05/22/2020 CLINICAL DATA:  Chest pain. EXAM: PORTABLE CHEST 1 VIEW COMPARISON:  07/08/2017 FINDINGS: Low lung volumes are present, causing crowding of the pulmonary vasculature. Bandlike density favoring subsegmental atelectasis or scarring in the left lower lobe, retrocardiac position. The lungs appear otherwise clear. Atherosclerotic calcification of the aortic arch. No blunting of the costophrenic angles. IMPRESSION: 1. Bandlike density in the left lower lobe favoring subsegmental atelectasis or scarring. 2. Low lung volumes. 3. Atherosclerosis. Electronically Signed   By: Van Clines M.D.   On: 05/22/2020 14:43    Assessment/Plan: 21 yM with hypotension following  bilateral ureteroscopy for bilateral renal/ureteral calculi.  PMHx of hepatocellular carcinoma, afib, HTN, CKD IV.  -Acute on chronic renal failure- likely secondary to prolonged hypotension.  UOP is improving and the patient is voiding spontaneously.  Hematuria resolving.  Will check KUB to assess ureteral stent position.  IVF discontinued.  Encouraged PO intake.  -Continue to monitor BP and HR.  Cardiology following.  Assistance is much appreciated.  -Possible d/c home later today following cardiology recs.    LOS: 1 day   Ellison Hughs, MD [facility] Pager: [phone]  05/24/2020, 8:58 AM

## 2020-05-26 ENCOUNTER — Other Ambulatory Visit: Payer: Self-pay | Admitting: Oncology

## 2020-05-27 ENCOUNTER — Other Ambulatory Visit: Payer: Self-pay

## 2020-05-27 ENCOUNTER — Inpatient Hospital Stay: Payer: Medicare Other

## 2020-05-27 ENCOUNTER — Inpatient Hospital Stay (HOSPITAL_BASED_OUTPATIENT_CLINIC_OR_DEPARTMENT_OTHER): Payer: Medicare Other | Admitting: Oncology

## 2020-05-27 VITALS — BP 140/83 | HR 83 | Temp 97.2°F | Resp 17 | Ht 68.0 in | Wt 214.4 lb

## 2020-05-27 DIAGNOSIS — C220 Liver cell carcinoma: Secondary | ICD-10-CM

## 2020-05-27 DIAGNOSIS — Z5112 Encounter for antineoplastic immunotherapy: Secondary | ICD-10-CM | POA: Diagnosis not present

## 2020-05-27 DIAGNOSIS — Z79899 Other long term (current) drug therapy: Secondary | ICD-10-CM | POA: Diagnosis not present

## 2020-05-27 DIAGNOSIS — R5383 Other fatigue: Secondary | ICD-10-CM

## 2020-05-27 LAB — CBC WITH DIFFERENTIAL (CANCER CENTER ONLY)
Abs Immature Granulocytes: 0.01 10*3/uL (ref 0.00–0.07)
Basophils Absolute: 0.1 10*3/uL (ref 0.0–0.1)
Basophils Relative: 1 %
Eosinophils Absolute: 0.4 10*3/uL (ref 0.0–0.5)
Eosinophils Relative: 8 %
HCT: 37.1 % — ABNORMAL LOW (ref 39.0–52.0)
Hemoglobin: 12.5 g/dL — ABNORMAL LOW (ref 13.0–17.0)
Immature Granulocytes: 0 %
Lymphocytes Relative: 20 %
Lymphs Abs: 1 10*3/uL (ref 0.7–4.0)
MCH: 35 pg — ABNORMAL HIGH (ref 26.0–34.0)
MCHC: 33.7 g/dL (ref 30.0–36.0)
MCV: 103.9 fL — ABNORMAL HIGH (ref 80.0–100.0)
Monocytes Absolute: 0.7 10*3/uL (ref 0.1–1.0)
Monocytes Relative: 14 %
Neutro Abs: 2.8 10*3/uL (ref 1.7–7.7)
Neutrophils Relative %: 57 %
Platelet Count: 121 10*3/uL — ABNORMAL LOW (ref 150–400)
RBC: 3.57 MIL/uL — ABNORMAL LOW (ref 4.22–5.81)
RDW: 14.6 % (ref 11.5–15.5)
WBC Count: 4.8 10*3/uL (ref 4.0–10.5)
nRBC: 0 % (ref 0.0–0.2)

## 2020-05-27 LAB — CMP (CANCER CENTER ONLY)
ALT: 22 U/L (ref 0–44)
AST: 52 U/L — ABNORMAL HIGH (ref 15–41)
Albumin: 2.7 g/dL — ABNORMAL LOW (ref 3.5–5.0)
Alkaline Phosphatase: 135 U/L — ABNORMAL HIGH (ref 38–126)
Anion gap: 6 (ref 5–15)
BUN: 45 mg/dL — ABNORMAL HIGH (ref 8–23)
CO2: 23 mmol/L (ref 22–32)
Calcium: 8.7 mg/dL — ABNORMAL LOW (ref 8.9–10.3)
Chloride: 108 mmol/L (ref 98–111)
Creatinine: 2.64 mg/dL — ABNORMAL HIGH (ref 0.61–1.24)
GFR, Estimated: 25 mL/min — ABNORMAL LOW (ref >60)
Glucose, Bld: 89 mg/dL (ref 70–99)
Potassium: 4.5 mmol/L (ref 3.5–5.1)
Sodium: 137 mmol/L (ref 135–145)
Total Bilirubin: 3.3 mg/dL — ABNORMAL HIGH (ref 0.3–1.2)
Total Protein: 7 g/dL (ref 6.5–8.1)

## 2020-05-27 MED ORDER — SODIUM CHLORIDE 0.9 % IV SOLN
240.0000 mg | Freq: Once | INTRAVENOUS | Status: AC
  Administered 2020-05-27: 240 mg via INTRAVENOUS
  Filled 2020-05-27: qty 24

## 2020-05-27 MED ORDER — SODIUM CHLORIDE 0.9 % IV SOLN
Freq: Once | INTRAVENOUS | Status: AC
  Filled 2020-05-27: qty 250

## 2020-05-27 NOTE — Progress Notes (Signed)
OK to treat today with current labs 05/27/2020 per Dr. Benay Spice

## 2020-05-27 NOTE — Patient Instructions (Signed)
[facility] Discharge Instructions for Patients Receiving Chemotherapy  Today you received the following chemotherapy agents: nivolumab.  To help prevent nausea and vomiting after your treatment, we encourage you to take your nausea medication as directed.   If you develop nausea and vomiting that is not controlled by your nausea medication, call the clinic.   BELOW ARE SYMPTOMS THAT SHOULD BE REPORTED IMMEDIATELY:  *FEVER GREATER THAN 100.5 F  *CHILLS WITH OR WITHOUT FEVER  NAUSEA AND VOMITING THAT IS NOT CONTROLLED WITH YOUR NAUSEA MEDICATION  *UNUSUAL SHORTNESS OF BREATH  *UNUSUAL BRUISING OR BLEEDING  TENDERNESS IN MOUTH AND THROAT WITH OR WITHOUT PRESENCE OF ULCERS  *URINARY PROBLEMS  *BOWEL PROBLEMS  UNUSUAL RASH Items with * indicate a potential emergency and should be followed up as soon as possible.  Feel free to call the clinic should you have any questions or concerns. The clinic phone number is [phone].  Please show the CHEMO ALERT CARD at check-in to the Emergency Department and triage nurse.   

## 2020-05-27 NOTE — Progress Notes (Signed)
[facility] OFFICE PROGRESS NOTE   Diagnosis: Hepatocellular carcinoma  INTERVAL HISTORY:   [name] returns as scheduled.  He has noted decreased swelling since starting Aldactone.  He underwent a cystoscopy with laser lithotripsy and bilateral JJ stent placement on 05/22/2020.  He developed postoperative hypotension and tachycardia requiring hospital admission.  He was discharged in the hospital 05/24/2020. No rash or diarrhea. Objective:  Vital signs in last 24 hours:  Blood pressure 140/83, pulse 83, temperature (!) 97.2 F (36.2 C), temperature source Tympanic, resp. rate 17, height 5\' 8"  (1.727 m), weight 214 lb 6.4 oz (97.3 kg), SpO2 100 %.    Resp: Lungs clear bilaterally Cardio: Irregular GI: Mildly distended, no hepatomegaly Vascular: No leg edema   Portacath/PICC-without erythema  Lab Results:  Lab Results  Component Value Date   WBC 4.8 05/27/2020   HGB 12.5 (L) 05/27/2020   HCT 37.1 (L) 05/27/2020   MCV 103.9 (H) 05/27/2020   PLT 121 (L) 05/27/2020   NEUTROABS 2.8 05/27/2020    CMP  Lab Results  Component Value Date   NA 137 05/27/2020   K 4.5 05/27/2020   CL 108 05/27/2020   CO2 23 05/27/2020   GLUCOSE 89 05/27/2020   BUN 45 (H) 05/27/2020   CREATININE 2.64 (H) 05/27/2020   CALCIUM 8.7 (L) 05/27/2020   PROT 7.0 05/27/2020   ALBUMIN 2.7 (L) 05/27/2020   AST 52 (H) 05/27/2020   ALT 22 05/27/2020   ALKPHOS 135 (H) 05/27/2020   BILITOT 3.3 (H) 05/27/2020   GFRNONAA 25 (L) 05/27/2020   GFRAA 37 (L) 04/08/2020    No results found for: CEA1  Lab Results  Component Value Date   INR 1.5 (H) 05/23/2020    Imaging:  DG Abd 1 View  Result Date: 05/24/2020 CLINICAL DATA:  Evaluate stent position. EXAM: ABDOMEN - 1 VIEW COMPARISON:  CT AP from 01/02/2020 FINDINGS: There is been interval placement of bilateral nephroureteral stents. The proximal pigtails project over the expected locations of both kidneys. The distal pigtails  converge in the central pelvis in the expected location of the urinary bladder. 3 mm calcification is noted projecting over the left kidney. Embolization coils from recent gastro duodenal artery occlusion noted. IMPRESSION: Interval placement of bilateral nephroureteral stents. 3 mm calcification projects over the left kidney. Electronically Signed   By: Kerby Moors M.D.   On: 05/24/2020 12:33    Medications: I have reviewed the patient's current medications.   Assessment/Plan: 1. Hepatocellular carcinoma ? Multiple Li-Rad 5 liver lesions noted on a CT abdomen/pelvis 01/02/2020 including a 7.1 x 5.7 cm segment 2 lesion, there are right and left hepatic lesions ? CT abdomen/pelvis 01/02/2020-cirrhosis with varices, multiple LR 5 liver lesions, 1.1 cm fluid density lesion in the pancreas head, splenomegaly, bilateral kidney stones, small amount of right perihepatic ascites ? MRI liver 02/08/2020-cirrhosis, numerous hyperenhancing liver lesions, largest lesion in segment two measuring 7.9 x 6.1 cm, lesions are consistent with multifocal hepatocellular carcinoma, the three largest lesions are categorized as LI-RADS5. 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 Y90 left hepatic lobe 03/01/2020 ? Cycle 1 nivolumab 04/08/2020 ? Cycle 2 nivolumab 04/22/2020 ? Cycle 3 nivolumab 05/13/2020 ? Cycle 4 nivolumab 05/27/2020 2. 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 Hypertension 4. BPH 5. Hyperlipidemia 6. History of heavy alcohol use 7. Remote history of tobacco  use 8. Renal insufficiency 9. Microscopic hematuria 10. 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  Esophageal mucosal changes consistent with long segment Barrett's esophagus present in the distal  esophagus.  Severe portal hypertensive gastropathy found in the entire examined stomach.  Small hiatal hernia. 11.  Mild right hydronephrosis with a probable calculus in the proximal third of the right ureter on the MRI 03/06/2020, renal ultrasound 04/01/2020-multiple nonobstructing bilateral renal calculi  05/22/2020-laser lithotripsy, bilateral ureter stent placement 12.  Atrial fibrillation      Disposition: [name] has completed 3 treatments with nivolumab.  He has tolerated the nivolumab well.  He will complete cycle 4 today.  The plan is to obtain a restaging liver MRI after 6 cycles of nivolumab.  The volume overload appears improved since the start of spironolactone.  He is scheduled to see Dr. Fuller Plan tomorrow.  [name] will return for an office visit and nivolumab in 2 weeks.  Betsy Coder, MD  05/27/2020  10:11 AM

## 2020-05-28 ENCOUNTER — Ambulatory Visit: Payer: Medicare Other | Admitting: Nurse Practitioner

## 2020-05-28 ENCOUNTER — Telehealth: Payer: Self-pay | Admitting: Oncology

## 2020-05-28 ENCOUNTER — Ambulatory Visit: Payer: Medicare Other | Admitting: Cardiology

## 2020-05-28 NOTE — Telephone Encounter (Signed)
Scheduled appointments per 11/22 los. Called patient, no answer. Left message for patient with appointment dates and times.

## 2020-05-29 ENCOUNTER — Telehealth: Payer: Self-pay | Admitting: Cardiology

## 2020-05-29 ENCOUNTER — Telehealth: Payer: Self-pay | Admitting: *Deleted

## 2020-05-29 ENCOUNTER — Other Ambulatory Visit: Payer: Self-pay

## 2020-05-29 ENCOUNTER — Emergency Department (HOSPITAL_COMMUNITY)
Admission: EM | Admit: 2020-05-29 | Discharge: 2020-05-29 | Disposition: A | Discharge status: Home / Self Care | Payer: Medicare Other | Attending: Emergency Medicine | Admitting: Emergency Medicine

## 2020-05-29 ENCOUNTER — Emergency Department (HOSPITAL_COMMUNITY): Payer: Medicare Other

## 2020-05-29 ENCOUNTER — Encounter (HOSPITAL_COMMUNITY): Payer: Self-pay

## 2020-05-29 DIAGNOSIS — I639 Cerebral infarction, unspecified: Secondary | ICD-10-CM | POA: Diagnosis not present

## 2020-05-29 DIAGNOSIS — I951 Orthostatic hypotension: Secondary | ICD-10-CM | POA: Insufficient documentation

## 2020-05-29 DIAGNOSIS — R9082 White matter disease, unspecified: Secondary | ICD-10-CM | POA: Diagnosis not present

## 2020-05-29 DIAGNOSIS — I10 Essential (primary) hypertension: Secondary | ICD-10-CM | POA: Insufficient documentation

## 2020-05-29 DIAGNOSIS — I739 Peripheral vascular disease, unspecified: Secondary | ICD-10-CM | POA: Diagnosis not present

## 2020-05-29 DIAGNOSIS — I4891 Unspecified atrial fibrillation: Secondary | ICD-10-CM | POA: Diagnosis not present

## 2020-05-29 DIAGNOSIS — Z87891 Personal history of nicotine dependence: Secondary | ICD-10-CM | POA: Diagnosis not present

## 2020-05-29 DIAGNOSIS — I708 Atherosclerosis of other arteries: Secondary | ICD-10-CM | POA: Diagnosis not present

## 2020-05-29 DIAGNOSIS — N200 Calculus of kidney: Secondary | ICD-10-CM | POA: Diagnosis not present

## 2020-05-29 DIAGNOSIS — R42 Dizziness and giddiness: Secondary | ICD-10-CM | POA: Diagnosis present

## 2020-05-29 DIAGNOSIS — I672 Cerebral atherosclerosis: Secondary | ICD-10-CM | POA: Diagnosis not present

## 2020-05-29 DIAGNOSIS — I6389 Other cerebral infarction: Secondary | ICD-10-CM | POA: Diagnosis not present

## 2020-05-29 LAB — CBC WITH DIFFERENTIAL/PLATELET
Abs Immature Granulocytes: 0.02 10*3/uL (ref 0.00–0.07)
Basophils Absolute: 0 10*3/uL (ref 0.0–0.1)
Basophils Relative: 0 %
Eosinophils Absolute: 0.2 10*3/uL (ref 0.0–0.5)
Eosinophils Relative: 4 %
HCT: 37.6 % — ABNORMAL LOW (ref 39.0–52.0)
Hemoglobin: 12.5 g/dL — ABNORMAL LOW (ref 13.0–17.0)
Immature Granulocytes: 0 %
Lymphocytes Relative: 17 %
Lymphs Abs: 0.9 10*3/uL (ref 0.7–4.0)
MCH: 35.5 pg — ABNORMAL HIGH (ref 26.0–34.0)
MCHC: 33.2 g/dL (ref 30.0–36.0)
MCV: 106.8 fL — ABNORMAL HIGH (ref 80.0–100.0)
Monocytes Absolute: 0.6 10*3/uL (ref 0.1–1.0)
Monocytes Relative: 11 %
Neutro Abs: 3.5 10*3/uL (ref 1.7–7.7)
Neutrophils Relative %: 68 %
Platelets: 117 10*3/uL — ABNORMAL LOW (ref 150–400)
RBC: 3.52 MIL/uL — ABNORMAL LOW (ref 4.22–5.81)
RDW: 15 % (ref 11.5–15.5)
WBC: 5.2 10*3/uL (ref 4.0–10.5)
nRBC: 0 % (ref 0.0–0.2)

## 2020-05-29 LAB — COMPREHENSIVE METABOLIC PANEL
ALT: 28 U/L (ref 0–44)
AST: 57 U/L — ABNORMAL HIGH (ref 15–41)
Albumin: 2.9 g/dL — ABNORMAL LOW (ref 3.5–5.0)
Alkaline Phosphatase: 134 U/L — ABNORMAL HIGH (ref 38–126)
Anion gap: 8 (ref 5–15)
BUN: 40 mg/dL — ABNORMAL HIGH (ref 8–23)
CO2: 23 mmol/L (ref 22–32)
Calcium: 8.9 mg/dL (ref 8.9–10.3)
Chloride: 106 mmol/L (ref 98–111)
Creatinine, Ser: 2.31 mg/dL — ABNORMAL HIGH (ref 0.61–1.24)
GFR, Estimated: 29 mL/min — ABNORMAL LOW (ref >60)
Glucose, Bld: 114 mg/dL — ABNORMAL HIGH (ref 70–99)
Potassium: 4.5 mmol/L (ref 3.5–5.1)
Sodium: 137 mmol/L (ref 135–145)
Total Bilirubin: 3.6 mg/dL — ABNORMAL HIGH (ref 0.3–1.2)
Total Protein: 7.2 g/dL (ref 6.5–8.1)

## 2020-05-29 LAB — URINALYSIS, ROUTINE W REFLEX MICROSCOPIC
Bilirubin Urine: NEGATIVE
Glucose, UA: NEGATIVE mg/dL
Ketones, ur: NEGATIVE mg/dL
Nitrite: NEGATIVE
Protein, ur: 100 mg/dL — AB
RBC / HPF: >50 RBC/hpf — ABNORMAL HIGH (ref 0–5)
Specific Gravity, Urine: 1.014 (ref 1.005–1.030)
WBC, UA: >50 WBC/hpf — ABNORMAL HIGH (ref 0–5)
pH: 6 (ref 5.0–8.0)

## 2020-05-29 MED ORDER — SODIUM CHLORIDE 0.9 % IV BOLUS
500.0000 mL | Freq: Once | INTRAVENOUS | Status: AC
  Administered 2020-05-29: 500 mL via INTRAVENOUS

## 2020-05-29 NOTE — Telephone Encounter (Signed)
Patient's wife called to let Dr. Percival Spanish know that her husband is in the hospital.

## 2020-05-29 NOTE — Discharge Instructions (Addendum)
Talk to your doctor about adjustment of your blood pressure medicines.  Try and keep yourself hydrated.  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  Follow-up with your doctor or potentially neurology for further evaluation of this.

## 2020-05-29 NOTE — ED Provider Notes (Signed)
[facility] DEPT Provider Note   CSN: [id number] Arrival date & time: 05/29/20  4259     History Chief Complaint  Patient presents with  . Dizziness    Dillon Perkins is a 69 y.o. male.  HPI   Pt has noticed for the last couple of days he has woken up in the morning and feels very lightheaded.  He feels that he is going to fall and has to hold on to something.  He saw spots when it occurred.   It lasted a couple of hours and then resolved.He feels fine now and  was able to walk in without any trouble.   No vomiting or diarrhea.  No blood in the stool.  He has noticed some blood in his urine but he had recent urological procedure (stents).  No trouble with vision or speech.  He does not feel that his balance is off. Patient does have multiple medical problems.  He does have history of hepatocellular carcinoma.  He last had an infusion of nivolumab on November 22.  Patient also had a cystoscopy, ureteroscopy, stent placement on November 17.  Patient called his oncologist this morning when he had the dizziness.  They suggested he come to the ED.  Past Medical History:  Diagnosis Date  . Alcoholic cirrhosis ([place])   . Atrial fibrillation ([place])   . GERD (gastroesophageal reflux disease)   . Hepatocellular carcinoma ([place])   . History of chemotherapy taking immunotherapy   last dose 05-13-2020  . History of kidney stones   . Hyperlipidemia   . Hypertension     Patient Active Problem List   Diagnosis Date Noted  . Kidney stones 05/22/2020  . Hepatocellular carcinoma ([place]) 03/26/2020  . Goals of care, counseling/discussion 03/26/2020  . Educated about COVID-19 virus infection 11/07/2018  . Dilated cardiomyopathy ([place]) 11/07/2018  . SOB (shortness of breath) 11/07/2018  . Chest pain   . Acute renal insufficiency   . Paroxysmal atrial fibrillation ([facility])   . Bradycardia 07/22/2014  . Exercise intolerance 09/24/2012  . Abnormal CXR 09/06/2012  .  Abnormal LFTs (liver function tests) 02/07/2012  . Hyperlipidemia 02/07/2012  . Atrial fibrillation ([place]) 01/31/2012  . Snoring 01/31/2012  . Essential hypertension 08/29/2010    Past Surgical History:  Procedure Laterality Date  . APPENDECTOMY  yrs ago  . CYSTOSCOPY/URETEROSCOPY/HOLMIUM LASER/STENT PLACEMENT Bilateral 05/22/2020   Procedure: CYSTOSCOPY/RETROGRADE/URETEROSCOPY/HOLMIUM LASER/STENT PLACEMENT STONE BASKET RETRIVAL ;  Surgeon: Ceasar Mons, MD;  Location: [facility];  Service: Urology;  Laterality: Bilateral;  . IR ANGIOGRAM SELECTIVE EACH ADDITIONAL VESSEL  02/19/2020  . IR ANGIOGRAM SELECTIVE EACH ADDITIONAL VESSEL  02/19/2020  . IR ANGIOGRAM SELECTIVE EACH ADDITIONAL VESSEL  02/19/2020  . IR ANGIOGRAM SELECTIVE EACH ADDITIONAL VESSEL  02/19/2020  . IR ANGIOGRAM SELECTIVE EACH ADDITIONAL VESSEL  02/19/2020  . IR ANGIOGRAM SELECTIVE EACH ADDITIONAL VESSEL  02/19/2020  . IR ANGIOGRAM SELECTIVE EACH ADDITIONAL VESSEL  03/01/2020  . IR ANGIOGRAM SELECTIVE EACH ADDITIONAL VESSEL  03/01/2020  . IR ANGIOGRAM SELECTIVE EACH ADDITIONAL VESSEL  03/01/2020  . IR ANGIOGRAM VISCERAL SELECTIVE  02/19/2020  . IR ANGIOGRAM VISCERAL SELECTIVE  03/01/2020  . IR EMBO ARTERIAL NOT HEMORR HEMANG INC GUIDE ROADMAPPING  02/19/2020  . IR EMBO TUMOR ORGAN ISCHEMIA INFARCT INC GUIDE ROADMAPPING  02/19/2020  . IR EMBO TUMOR ORGAN ISCHEMIA INFARCT INC GUIDE ROADMAPPING  03/01/2020  . IR RADIOLOGIST EVAL & MGMT  01/30/2020  . IR US GUIDE VASC ACCESS RIGHT  02/19/2020  . IR US GUIDE VASC ACCESS RIGHT  03/01/2020       Family History  Problem Relation Age of Onset  . Heart disease Father 33       Heart attack  . Heart disease Sister 45       Heart attack  . Breast cancer Mother   . Colon cancer Neg Hx   . Stomach cancer Neg Hx   . Pancreatic cancer Neg Hx   . Rectal cancer Neg Hx   . Esophageal cancer Neg Hx     Social History   Tobacco Use  . Smoking status: Former  Smoker    Packs/day: 1.00    Years: 0.50    Pack years: 0.50    Types: Cigarettes    Quit date: 08/21/1972    Years since quitting: 47.8  . Smokeless tobacco: Never Used  Vaping Use  . Vaping Use: Never used  Substance Use Topics  . Alcohol use: Not Currently    Alcohol/week: 2.0 standard drinks    Types: 2 Cans of beer per week  . Drug use: No    Home Medications Prior to Admission medications   Medication Sig Start Date End Date Taking? Authorizing Provider  cephALEXin (KEFLEX) 500 MG capsule Take 500 mg by mouth 2 (two) times daily. X 3 days  05/28/20  Yes [provider]  Coenzyme Q10 (COQ10) 100 MG CAPS Take 100 mg by mouth daily.    Yes [provider]  Ibuprofen-diphenhydrAMINE Cit (ADVIL PM PO) Take 3 tablets by mouth at bedtime as needed (sleep).    Yes [provider]  pantoprazole (PROTONIX) 40 MG tablet Take 1 tablet (40 mg total) by mouth daily. 03/19/20  Yes Ladene Artist, MD  pravastatin (PRAVACHOL) 40 MG tablet Take 40 mg by mouth at bedtime.  09/26/18  Yes [provider]  spironolactone (ALDACTONE) 50 MG tablet Take 0.5 tablets (25 mg total) by mouth daily. 05/24/20  Yes Isaiah Serge, NP  tamsulosin (FLOMAX) 0.4 MG CAPS capsule Take 0.4 mg by mouth at bedtime.  01/14/20  Yes [provider]  furosemide (LASIX) 20 MG tablet Take 1 tablet (20 mg total) by mouth daily as needed (only take if weight is up 2 pounds in a day or 5 pounds in a week). Patient not taking: Reported on 05/27/2020 05/24/20 05/24/21  Isaiah Serge, NP  HYDROcodone-acetaminophen (NORCO) 5-325 MG tablet Take 1 tablet by mouth every 4 (four) hours as needed for moderate pain. Patient not taking: Reported on 05/27/2020 05/22/20   Ceasar Mons, MD  ondansetron (ZOFRAN) 4 MG tablet Take 1 tablet (4 mg total) by mouth daily as needed for nausea or vomiting. Patient not taking: Reported on 05/27/2020 05/22/20 05/22/21  Ceasar Mons, MD  oxybutynin (DITROPAN XL) 10 MG 24 hr tablet Take 1 tablet (10 mg total) by mouth daily. Patient not taking: Reported on 05/27/2020 05/22/20 05/22/21  Ceasar Mons, MD  phenazopyridine (PYRIDIUM) 200 MG tablet Take 1 tablet (200 mg total) by mouth 3 (three) times daily as needed (for pain with urination). Patient not taking: Reported on 05/27/2020 05/22/20 05/22/21  Ceasar Mons, MD    Allergies    Patient has no known allergies.  Review of Systems   Review of Systems  All other systems reviewed and are negative.   Physical Exam Updated Vital Signs BP (!) 132/91   Pulse 75   Temp 97.6 F (36.4 C) (Oral)  Resp 18   SpO2 98%   Physical Exam Vitals and nursing note reviewed.  Constitutional:      General: He is not in acute distress.    Appearance: He is well-developed.  HENT:     Head: Normocephalic and atraumatic.     Right Ear: External ear normal.     Left Ear: External ear normal.  Eyes:     General: No scleral icterus.       Right eye: No discharge.        Left eye: No discharge.     Conjunctiva/sclera: Conjunctivae normal.  Neck:     Trachea: No tracheal deviation.  Cardiovascular:     Rate and Rhythm: Normal rate and regular rhythm.  Pulmonary:     Effort: Pulmonary effort is normal. No respiratory distress.     Breath sounds: Normal breath sounds. No stridor. No wheezing or rales.  Abdominal:     General: Bowel sounds are normal. There is no distension.     Palpations: Abdomen is soft.     Tenderness: There is no abdominal tenderness. There is no guarding or rebound.  Musculoskeletal:        General: No tenderness.     Cervical back: Neck supple.  Skin:    General: Skin is warm and dry.     Findings: No rash.  Neurological:     Mental Status: He is alert and oriented to person, place, and time.     Cranial Nerves: No cranial nerve deficit (No facial droop, extraocular movements intact, tongue midline ).     Sensory:  No sensory deficit.     Motor: No abnormal muscle tone or seizure activity.     Coordination: Coordination normal.     Comments: No pronator drift bilateral upper extrem, able to hold both legs off bed for 5 seconds, sensation intact in all extremities, no visual field cuts, no left or right sided neglect, normal finger-nose exam bilaterally, no nystagmus noted      ED Results / Procedures / Treatments   Labs (all labs ordered are listed, but only abnormal results are displayed) Labs Reviewed  COMPREHENSIVE METABOLIC PANEL - Abnormal; Notable for the following components:      Result Value   Glucose, Bld 114 (*)    BUN 40 (*)    Creatinine, Ser 2.31 (*)    Albumin 2.9 (*)    AST 57 (*)    Alkaline Phosphatase 134 (*)    Total Bilirubin 3.6 (*)    GFR, Estimated 29 (*)    All other components within normal limits  CBC WITH DIFFERENTIAL/PLATELET - Abnormal; Notable for the following components:   RBC 3.52 (*)    Hemoglobin 12.5 (*)    HCT 37.6 (*)    MCV 106.8 (*)    MCH 35.5 (*)    Platelets 117 (*)    All other components within normal limits  URINALYSIS, ROUTINE W REFLEX MICROSCOPIC - Abnormal; Notable for the following components:   APPearance HAZY (*)    Hgb urine dipstick LARGE (*)    Protein, ur 100 (*)    Leukocytes,Ua LARGE (*)    RBC / HPF >50 (*)    WBC, UA >50 (*)    Bacteria, UA RARE (*)    All other components within normal limits    EKG EKG Interpretation  Date/Time:  Wednesday May 29 2020 09:55:41 EST Ventricular Rate:  99 PR Interval:    QRS Duration: 91 QT Interval:  365 QTC Calculation: 447 R Axis:   0 Text Interpretation: Atrial fibrillation Inferior infarct, old Since last tracing rate slower Confirmed by Dorie Rank [phone]) on 05/29/2020 10:12:07 AM   Radiology CT Head Wo Contrast  Result Date: 05/29/2020 CLINICAL DATA:  Vertigo for 3 days EXAM: CT HEAD WITHOUT CONTRAST TECHNIQUE: Contiguous axial images were obtained from the base of  the skull through the vertex without intravenous contrast. COMPARISON:  None. FINDINGS: Brain: The ventricles and sulci are normal in size and configuration. There is no appreciable mass, hemorrhage, extra-axial fluid collection, or midline shift. There is a focal area of decreased attenuation at the gray-white junction of the right frontal-parietal lobe junctions, felt to represent a recent focal infarct in this area. Elsewhere there is slight small vessel disease in the centra semiovale bilaterally. Vascular: No hyperdense vessel. Foci of calcification in each distal vertebral artery and carotid siphon region noted. Skull: The bony calvarium appears intact. Sinuses/Orbits: Visualized paranasal sinuses are clear. Visualized orbits appear symmetric bilaterally. Other: Mastoid air cells are clear. IMPRESSION: Apparent recent and likely acute infarct at the gray-white junction of the right frontal-parietal lobe junction region. Small vessel disease in the centra semiovale bilaterally, most notably posteriorly and superiorly. A recent white matter infarct in the posterior right centrum semiovale cannot be entirely excluded. No mass or hemorrhage. Foci of arterial vascular calcification noted. Electronically Signed   By: Lowella Grip III M.D.   On: 05/29/2020 11:53    Procedures Procedures (including critical care time)  Medications Ordered in ED Medications  sodium chloride 0.9 % bolus 500 mL (0 mLs Intravenous Stopped 05/29/20 1100)  sodium chloride 0.9 % bolus 500 mL (0 mLs Intravenous Stopped 05/29/20 1658)    ED Course  I have reviewed the triage vital signs and the nursing notes.  Pertinent labs & imaging results that were available during my care of the patient were reviewed by me and considered in my medical decision making (see chart for details).  Clinical Course as of May 30 1703  Wed May 29, 2020  1036 Orthostatic vital signs performed.  Patient blood pressure dropped and his heart  rate increased.  Positive orthostatic vital signs   [JK]  1036 Anemia is stable   [JK]  1116 Urinalysis does show large amount of leukocyte esterase with blood cells and white blood cells.  Doubt infection.  Suspect related to recent urologic procedure   [JK]  1116 Creatinine is increased at 2.31 but this is actually less than baseline when compared to previous lab   [JK]  1117 Bilirubin elevated but not significantly changed compared to previous labs   [JK]  1158 Head CT is suggesting acute infarct   [JK]  1302 Discussed findings with patient.  Will consult with neurohospitalist regarding imaging   [JK]  1604 Patient was able to stand up and walk to the bathroom.  He states his blood pressure did drop but he was not feeling lightheaded   [JK]    Clinical Course User Index [JK] Dorie Rank, MD   MDM Rules/Calculators/A&P                         DDX:  Vertigo, anemia, dehydration, orthostatic hypotension.    Less likelyDoubt CVA TIA Plan on labs, EKG , orthostatic vital signs, head ct  With history of malignancy  Patient presented with complaints of dizziness.  Symptoms were suggestive of possible orthostatic component as it would occur with standing.  No clear vertigo symptoms on my exam.  Patient was noted to be orthostatic.  Patient does have history of malignancy so head CT was performed.  Patient's head CT does show possible acute stroke.  Patient states he does not want to be admitted to the hospital if possible.  If his symptoms are related to the orthostatic hypotension that has improved with IV fluids and I think we could back off on his diuretics.  We will proceed with MRI today in the ED.  If the MRI is negative and the head CT finding turned out to be an artifact plan on discharge.  If positive patient will require admission and he agrees with this.  Will turn over to dr pickering to follow up on mri Final Clinical Impression(s) / ED Diagnoses Final diagnoses:  Orthostatic  hypotension     Dorie Rank, MD 05/29/20 1706

## 2020-05-29 NOTE — Telephone Encounter (Signed)
Spoke with patient's spouse. She is calling to let Dr. Percival Spanish know patient is in the hospital at [facility] and will be undergoing an MRI. Will route to MD so that he is aware.

## 2020-05-29 NOTE — ED Notes (Signed)
Patient said he was too dizzy to continue with orthostatic vital signs being taken after the first standing vital signs were taken, he had been standing for approximately 2 minutes and 40 seconds.

## 2020-05-29 NOTE — ED Triage Notes (Signed)
Pt arrived via walk in, c/o dizziness x3 days. States it is "room spinning when I get up in the morning" that almost causes him to fall over. Progressively better as the day goes on.

## 2020-05-29 NOTE — ED Provider Notes (Signed)
  Physical Exam  BP (!) 149/105 (BP Location: Left Arm)   Pulse 89   Temp 98 F (36.7 C) (Oral)   Resp 20   SpO2 97%   Physical Exam  ED Course/Procedures   Clinical Course as of May 30 1847  Wed May 29, 2020  1036 Orthostatic vital signs performed.  Patient blood pressure dropped and his heart rate increased.  Positive orthostatic vital signs   [JK]  1036 Anemia is stable   [JK]  1116 Urinalysis does show large amount of leukocyte esterase with blood cells and white blood cells.  Doubt infection.  Suspect related to recent urologic procedure   [JK]  1116 Creatinine is increased at 2.31 but this is actually less than baseline when compared to previous lab   [JK]  1117 Bilirubin elevated but not significantly changed compared to previous labs   [JK]  1158 Head CT is suggesting acute infarct   [JK]  1302 Discussed findings with patient.  Will consult with neurohospitalist regarding imaging   [JK]  1604 Patient was able to stand up and walk to the bathroom.  He states his blood pressure did drop but he was not feeling lightheaded   [JK]    Clinical Course User Index [JK] Dorie Rank, MD    Procedures  MDM  Received patient in signout.  Orthostatic hypotension.  Some dizziness.  Feels better after treatment.  Does have history of orthostatic hypotension.  MRI done due to abnormal head CT.   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  Recently dizziness he has at times.  Patient does not want admission in the hospital.  Urine culture pending.  Discharge home with PCP and possibly neurology follow-up.  May need more blood pressure adjustment also.      Davonna Belling, MD 05/29/20 1850

## 2020-05-29 NOTE — ED Notes (Signed)
Pt in bed watching TV, wife at bedside. Respirations even but  labored when moving around in bed. Denies any needs at this time.

## 2020-05-29 NOTE — Telephone Encounter (Signed)
Reports significant dizziness beginning 11/23. Room spins with any position changes and unable to walk without staggering and dizziness. BP is normal, pulse 99 at rest, no fever. Only slight bleeding w/urination from prior stents placed, but is improving. His oral intake is good of fluids. Per Dr. Benay Spice: This is best worked up in emergency room. Wife notified and agreed.

## 2020-05-29 NOTE — ED Notes (Signed)
Pt in bed resting, respirations even and unlabored. Wife at bedside. Denies any needs at this time.

## 2020-05-31 LAB — URINE CULTURE: Culture: <10000 — AB

## 2020-06-03 DIAGNOSIS — N201 Calculus of ureter: Secondary | ICD-10-CM | POA: Diagnosis not present

## 2020-06-03 DIAGNOSIS — N202 Calculus of kidney with calculus of ureter: Secondary | ICD-10-CM | POA: Diagnosis not present

## 2020-06-04 ENCOUNTER — Encounter: Payer: Self-pay | Admitting: Cardiology

## 2020-06-04 ENCOUNTER — Telehealth (INDEPENDENT_AMBULATORY_CARE_PROVIDER_SITE_OTHER): Payer: Medicare Other | Admitting: Cardiology

## 2020-06-04 DIAGNOSIS — E785 Hyperlipidemia, unspecified: Secondary | ICD-10-CM | POA: Diagnosis not present

## 2020-06-04 DIAGNOSIS — I951 Orthostatic hypotension: Secondary | ICD-10-CM

## 2020-06-04 DIAGNOSIS — C220 Liver cell carcinoma: Secondary | ICD-10-CM | POA: Diagnosis not present

## 2020-06-04 DIAGNOSIS — I480 Paroxysmal atrial fibrillation: Secondary | ICD-10-CM

## 2020-06-04 NOTE — Progress Notes (Signed)
Virtual Visit via Video Note   This visit type was conducted due to national recommendations for restrictions regarding the COVID-19 Pandemic (e.g. social distancing) in an effort to limit this patient's exposure and mitigate transmission in our community.  Due to his co-morbid illnesses, this patient is at least at moderate risk for complications without adequate follow up.  This format is felt to be most appropriate for this patient at this time.  All issues noted in this document were discussed and addressed.  A limited physical exam was performed with this format.  Please refer to the patient's chart for his consent to telehealth for [facility].       Date:  06/04/2020   ID:  Dillon Perkins, DOB 01-08-1951, MRN [id number] The patient was identified using 2 identifiers.  Patient Location: Home Provider Location: Home Office  PCP:  Antony Contras, MD  Cardiologist:  Minus Breeding, MD  Electrophysiologist:  None   Evaluation Performed:  Follow-Up Visit  Chief Complaint:  Orthostatic B/P  History of Present Illness:    Dillon Perkins is a 69 y.o. male with history of PAF and hepatocellular carcinoma with cirrhosis and esophageal varices.  In addition he has a history of PAF, chronic renal insufficiency, hypertension, and dyslipidemia.  He had previously been on Eliquis but admitted to me today that he rarely took it because of cost.  He was admitted to the hospital in November 2021 for bilateral JJ stent placement and lithotripsy.  Post procedure he had atrial fibrillation with RVR and hypotension.  He also had chest pain with a troponin of 4.  At discharge 05/24/2020 he was in atrial fibrillation with rate control.  He was taken off all his cardiac medications because of hypotension.  It was felt he would be high risk for anticoagulation due to his recent procedure and thrombocytopenia from his liver cancer.  His platelet count at discharge was 117.  His creatinine improved  from 3.22-2.31 after his stent placement.  Since discharge the patient was seen in the emergency room 05/29/2020 with orthostatic hypotension.  He improved with hydration.  He did have an MRI that showed remote lacunar strokes.  He is contacted today for virtual follow-up.  His wife was present during the video interview.  Since discharge the patient has stopped his Flomax at the suggestion of his urologist.  Since he stopped the Flomax his orthostatic symptoms seem improved.  Currently he is only taking Aldactone 25 mg a day, Protonix, Pravachol, and co-Q10.  His blood pressure at home ranges from 1 00-8 40 systolic, his heart rate is 70-80 with an occasional heart rate of 110.  He has not had chest pain.  The patient does not have symptoms concerning for COVID-19 infection (fever, chills, cough, or new shortness of breath).    Past Medical History:  Diagnosis Date  . Alcoholic cirrhosis ([place])   . Atrial fibrillation ([place])   . GERD (gastroesophageal reflux disease)   . Hepatocellular carcinoma ([place])   . History of chemotherapy taking immunotherapy   last dose 05-13-2020  . History of kidney stones   . Hyperlipidemia   . Hypertension    Past Surgical History:  Procedure Laterality Date  . APPENDECTOMY  yrs ago  . CYSTOSCOPY/URETEROSCOPY/HOLMIUM LASER/STENT PLACEMENT Bilateral 05/22/2020   Procedure: CYSTOSCOPY/RETROGRADE/URETEROSCOPY/HOLMIUM LASER/STENT PLACEMENT STONE BASKET RETRIVAL ;  Surgeon: Ceasar Mons, MD;  Location: [facility];  Service: Urology;  Laterality: Bilateral;  . IR ANGIOGRAM SELECTIVE EACH ADDITIONAL VESSEL  02/19/2020  . IR ANGIOGRAM SELECTIVE EACH ADDITIONAL VESSEL  02/19/2020  . IR ANGIOGRAM SELECTIVE EACH ADDITIONAL VESSEL  02/19/2020  . IR ANGIOGRAM SELECTIVE EACH ADDITIONAL VESSEL  02/19/2020  . IR ANGIOGRAM SELECTIVE EACH ADDITIONAL VESSEL  02/19/2020  . IR ANGIOGRAM SELECTIVE EACH ADDITIONAL VESSEL  02/19/2020  . IR ANGIOGRAM SELECTIVE  EACH ADDITIONAL VESSEL  03/01/2020  . IR ANGIOGRAM SELECTIVE EACH ADDITIONAL VESSEL  03/01/2020  . IR ANGIOGRAM SELECTIVE EACH ADDITIONAL VESSEL  03/01/2020  . IR ANGIOGRAM VISCERAL SELECTIVE  02/19/2020  . IR ANGIOGRAM VISCERAL SELECTIVE  03/01/2020  . IR EMBO ARTERIAL NOT HEMORR HEMANG INC GUIDE ROADMAPPING  02/19/2020  . IR EMBO TUMOR ORGAN ISCHEMIA INFARCT INC GUIDE ROADMAPPING  02/19/2020  . IR EMBO TUMOR ORGAN ISCHEMIA INFARCT INC GUIDE ROADMAPPING  03/01/2020  . IR RADIOLOGIST EVAL & MGMT  01/30/2020  . IR US GUIDE VASC ACCESS RIGHT  02/19/2020  . IR US GUIDE VASC ACCESS RIGHT  03/01/2020     No outpatient medications have been marked as taking for the 06/04/20 encounter (Appointment) with Erlene Quan, PA-C.     Allergies:   Patient has no known allergies.   Social History   Tobacco Use  . Smoking status: Former Smoker    Packs/day: 1.00    Years: 0.50    Pack years: 0.50    Types: Cigarettes    Quit date: 08/21/1972    Years since quitting: 47.8  . Smokeless tobacco: Never Used  Vaping Use  . Vaping Use: Never used  Substance Use Topics  . Alcohol use: Not Currently    Alcohol/week: 2.0 standard drinks    Types: 2 Cans of beer per week  . Drug use: No     Family Hx: The patient's family history includes Breast cancer in his mother; Heart disease (age of onset: 65) in his father; Heart disease (age of onset: 30) in his sister. There is no history of Colon cancer, Stomach cancer, Pancreatic cancer, Rectal cancer, or Esophageal cancer.  ROS:   Please see the history of present illness.    All other systems reviewed and are negative.   Prior CV studies:   The following studies were reviewed today:    Labs/Other Tests and Data Reviewed:    EKG:  An ECG dated 05/29/2020 was personally reviewed today and demonstrated:  AF with VR 98, inferior Qs  Recent Labs: 02/27/2020: BNP 326.6 05/13/2020: TSH 2.280 05/29/2020: ALT 28; BUN 40; Creatinine, Ser 2.31; Hemoglobin 12.5;  Platelets 117; Potassium 4.5; Sodium 137   Recent Lipid Panel Lab Results  Component Value Date/Time   CHOL 133 07/23/2014 05:05 AM   TRIG 122 07/23/2014 05:05 AM   HDL 35 (L) 07/23/2014 05:05 AM   CHOLHDL 3.8 07/23/2014 05:05 AM   LDLCALC 74 07/23/2014 05:05 AM    Wt Readings from Last 3 Encounters:  05/27/20 214 lb 6.4 oz (97.3 kg)  05/22/20 214 lb 4.8 oz (97.2 kg)  05/13/20 230 lb 3.2 oz (104.4 kg)     Risk Assessment/Calculations:     CHA2DS2-VASc Score =   4 This indicates a  % annual risk of stroke. The patient's score is based upon:       Objective:    Vital Signs:  There were no vitals taken for this visit.   VITAL SIGNS:  reviewed  ASSESSMENT & PLAN:    Orthostatic hypotension- Improved off Flomax  Permanent atrial fibrillation- Currently rate controlled on no medications.  He is not currently  anticoagulated.  H/O CVA- Remote lacunar infarcts on MRI 05/29/2020  Hepatocellular cancer- Followed by Dr Benay Spice- he is to have two more treatments next month.  Plan- I will review anticoagulation with Dr Percival Spanish- I'm not sure the patient needs Pravastatin.  This was prescribed in the past by his PCP.  The patient will have lipids drawn next week.  He will remain off Flomax.  They will continue to monitor his blood pressure.  The let us know if his systolic consistently runs greater than 140 or his heart rate runs greater than 1 10-1 20.  Follow-up with Dr. Percival Spanish in 3 months..    Shared Decision Making/Informed Consent        COVID-19 Education: The signs and symptoms of COVID-19 were discussed with the patient and how to seek care for testing (follow up with PCP or arrange E-visit).  The importance of social distancing was discussed today.  Time:   Today, I have spent 25 minutes with the patient with telehealth technology discussing the above problems.     Medication Adjustments/Labs and Tests Ordered: Current medicines are reviewed at length with  the patient today.  Concerns regarding medicines are outlined above.   Tests Ordered: No orders of the defined types were placed in this encounter.   Medication Changes: No orders of the defined types were placed in this encounter.   Follow Up:  In Person Dr Percival Spanish in 3 months  Signed, Kerin Ransom, PA-C  06/04/2020 9:02 AM    [place]

## 2020-06-04 NOTE — Patient Instructions (Addendum)
Medication Instructions:  Continue current medications  *If you need a refill on your cardiac medications before your next appointment, please call your pharmacy   Lab Work: None Ordered   Testing/Procedures: None Ordered   Follow-Up: At [facility], you and your health needs are our priority.  As part of our continuing mission to provide you with exceptional heart care, we have created designated Provider Care Teams.  These Care Teams include your primary Cardiologist (physician) and Advanced Practice Providers (APPs -  Physician Assistants and Nurse Practitioners) who all work together to provide you with the care you need, when you need it.  We recommend signing up for the patient portal called "MyChart".  Sign up information is provided on this After Visit Summary.  MyChart is used to connect with patients for Virtual Visits (Telemedicine).  Patients are able to view lab/test results, encounter notes, upcoming appointments, etc.  Non-urgent messages can be sent to your provider as well.   To learn more about what you can do with MyChart, go to [email or web address].    Your next appointment:   Monday February 28th @ 9:00 am  The format for your next appointment:   In Person  Provider:   You may see Minus Breeding, MD or one of the following Advanced Practice Providers on your designated Care Team:    Rosaria Ferries, PA-C  Jory Sims, DNP, ANP

## 2020-06-09 ENCOUNTER — Other Ambulatory Visit: Payer: Self-pay | Admitting: Oncology

## 2020-06-10 ENCOUNTER — Other Ambulatory Visit: Payer: Self-pay

## 2020-06-10 ENCOUNTER — Other Ambulatory Visit: Payer: Self-pay | Admitting: *Deleted

## 2020-06-10 DIAGNOSIS — C220 Liver cell carcinoma: Secondary | ICD-10-CM

## 2020-06-10 NOTE — Progress Notes (Signed)
Error

## 2020-06-11 ENCOUNTER — Inpatient Hospital Stay (HOSPITAL_BASED_OUTPATIENT_CLINIC_OR_DEPARTMENT_OTHER): Payer: Medicare Other | Admitting: Nurse Practitioner

## 2020-06-11 ENCOUNTER — Other Ambulatory Visit: Payer: Self-pay

## 2020-06-11 ENCOUNTER — Inpatient Hospital Stay: Payer: Medicare Other | Attending: Oncology

## 2020-06-11 ENCOUNTER — Ambulatory Visit: Payer: Medicare Other

## 2020-06-11 ENCOUNTER — Encounter: Payer: Self-pay | Admitting: Nurse Practitioner

## 2020-06-11 ENCOUNTER — Inpatient Hospital Stay: Payer: Medicare Other

## 2020-06-11 VITALS — BP 140/80 | HR 98 | Temp 97.8°F | Resp 20 | Ht 68.0 in | Wt 206.1 lb

## 2020-06-11 DIAGNOSIS — K766 Portal hypertension: Secondary | ICD-10-CM | POA: Diagnosis not present

## 2020-06-11 DIAGNOSIS — I4891 Unspecified atrial fibrillation: Secondary | ICD-10-CM | POA: Insufficient documentation

## 2020-06-11 DIAGNOSIS — K769 Liver disease, unspecified: Secondary | ICD-10-CM

## 2020-06-11 DIAGNOSIS — F1021 Alcohol dependence, in remission: Secondary | ICD-10-CM | POA: Diagnosis not present

## 2020-06-11 DIAGNOSIS — K7460 Unspecified cirrhosis of liver: Secondary | ICD-10-CM | POA: Diagnosis not present

## 2020-06-11 DIAGNOSIS — E785 Hyperlipidemia, unspecified: Secondary | ICD-10-CM | POA: Diagnosis not present

## 2020-06-11 DIAGNOSIS — K3189 Other diseases of stomach and duodenum: Secondary | ICD-10-CM | POA: Diagnosis not present

## 2020-06-11 DIAGNOSIS — Z87442 Personal history of urinary calculi: Secondary | ICD-10-CM | POA: Insufficient documentation

## 2020-06-11 DIAGNOSIS — Z79899 Other long term (current) drug therapy: Secondary | ICD-10-CM | POA: Diagnosis not present

## 2020-06-11 DIAGNOSIS — K449 Diaphragmatic hernia without obstruction or gangrene: Secondary | ICD-10-CM | POA: Diagnosis not present

## 2020-06-11 DIAGNOSIS — I10 Essential (primary) hypertension: Secondary | ICD-10-CM | POA: Diagnosis not present

## 2020-06-11 DIAGNOSIS — R5381 Other malaise: Secondary | ICD-10-CM | POA: Diagnosis not present

## 2020-06-11 DIAGNOSIS — N1330 Unspecified hydronephrosis: Secondary | ICD-10-CM | POA: Diagnosis not present

## 2020-06-11 DIAGNOSIS — R5383 Other fatigue: Secondary | ICD-10-CM

## 2020-06-11 DIAGNOSIS — R627 Adult failure to thrive: Secondary | ICD-10-CM | POA: Diagnosis not present

## 2020-06-11 DIAGNOSIS — R400 Somnolence: Secondary | ICD-10-CM | POA: Insufficient documentation

## 2020-06-11 DIAGNOSIS — R7989 Other specified abnormal findings of blood chemistry: Secondary | ICD-10-CM | POA: Insufficient documentation

## 2020-06-11 DIAGNOSIS — C220 Liver cell carcinoma: Secondary | ICD-10-CM

## 2020-06-11 DIAGNOSIS — N19 Unspecified kidney failure: Secondary | ICD-10-CM | POA: Diagnosis not present

## 2020-06-11 DIAGNOSIS — Z5112 Encounter for antineoplastic immunotherapy: Secondary | ICD-10-CM | POA: Insufficient documentation

## 2020-06-11 DIAGNOSIS — R3129 Other microscopic hematuria: Secondary | ICD-10-CM | POA: Diagnosis not present

## 2020-06-11 DIAGNOSIS — Z87891 Personal history of nicotine dependence: Secondary | ICD-10-CM | POA: Diagnosis not present

## 2020-06-11 LAB — LIPID PANEL
Cholesterol: 152 mg/dL (ref 0–200)
HDL: 28 mg/dL — ABNORMAL LOW
LDL Cholesterol: 112 mg/dL — ABNORMAL HIGH (ref 0–99)
Total CHOL/HDL Ratio: 5.4 ratio
Triglycerides: 62 mg/dL
VLDL: 12 mg/dL (ref 0–40)

## 2020-06-11 LAB — CBC WITH DIFFERENTIAL (CANCER CENTER ONLY)
Abs Immature Granulocytes: 0.01 10*3/uL (ref 0.00–0.07)
Basophils Absolute: 0.1 10*3/uL (ref 0.0–0.1)
Basophils Relative: 1 %
Eosinophils Absolute: 0.4 10*3/uL (ref 0.0–0.5)
Eosinophils Relative: 7 %
HCT: 37.2 % — ABNORMAL LOW (ref 39.0–52.0)
Hemoglobin: 12.6 g/dL — ABNORMAL LOW (ref 13.0–17.0)
Immature Granulocytes: 0 %
Lymphocytes Relative: 18 %
Lymphs Abs: 0.9 10*3/uL (ref 0.7–4.0)
MCH: 34.9 pg — ABNORMAL HIGH (ref 26.0–34.0)
MCHC: 33.9 g/dL (ref 30.0–36.0)
MCV: 103 fL — ABNORMAL HIGH (ref 80.0–100.0)
Monocytes Absolute: 0.5 10*3/uL (ref 0.1–1.0)
Monocytes Relative: 10 %
Neutro Abs: 3.1 10*3/uL (ref 1.7–7.7)
Neutrophils Relative %: 64 %
Platelet Count: 127 10*3/uL — ABNORMAL LOW (ref 150–400)
RBC: 3.61 MIL/uL — ABNORMAL LOW (ref 4.22–5.81)
RDW: 15 % (ref 11.5–15.5)
WBC Count: 4.9 10*3/uL (ref 4.0–10.5)
nRBC: 0 % (ref 0.0–0.2)

## 2020-06-11 LAB — CMP (CANCER CENTER ONLY)
ALT: 35 U/L (ref 0–44)
AST: 53 U/L — ABNORMAL HIGH (ref 15–41)
Albumin: 2.7 g/dL — ABNORMAL LOW (ref 3.5–5.0)
Alkaline Phosphatase: 155 U/L — ABNORMAL HIGH (ref 38–126)
Anion gap: 9 (ref 5–15)
BUN: 35 mg/dL — ABNORMAL HIGH (ref 8–23)
CO2: 19 mmol/L — ABNORMAL LOW (ref 22–32)
Calcium: 8.9 mg/dL (ref 8.9–10.3)
Chloride: 110 mmol/L (ref 98–111)
Creatinine: 2.14 mg/dL — ABNORMAL HIGH (ref 0.61–1.24)
GFR, Estimated: 32 mL/min — ABNORMAL LOW (ref >60)
Glucose, Bld: 88 mg/dL (ref 70–99)
Potassium: 4.1 mmol/L (ref 3.5–5.1)
Sodium: 138 mmol/L (ref 135–145)
Total Bilirubin: 3.6 mg/dL (ref 0.3–1.2)
Total Protein: 7.6 g/dL (ref 6.5–8.1)

## 2020-06-11 LAB — CORTISOL: Cortisol, Plasma: 17.1 ug/dL

## 2020-06-11 LAB — TSH: TSH: 2.488 u[IU]/mL (ref 0.320–4.118)

## 2020-06-11 MED ORDER — SODIUM CHLORIDE 0.9 % IV SOLN
Freq: Once | INTRAVENOUS | Status: AC
  Filled 2020-06-11: qty 250

## 2020-06-11 MED ORDER — SODIUM CHLORIDE 0.9 % IV SOLN
240.0000 mg | Freq: Once | INTRAVENOUS | Status: AC
  Administered 2020-06-11: 240 mg via INTRAVENOUS
  Filled 2020-06-11: qty 24

## 2020-06-11 MED ORDER — LORAZEPAM 0.5 MG PO TABS: 0.2500 mg | ORAL_TABLET | Freq: Every evening | ORAL | 0 refills | Status: DC | PRN

## 2020-06-11 MED ORDER — FUROSEMIDE 20 MG PO TABS
20.0000 mg | ORAL_TABLET | Freq: Every day | ORAL | 11 refills | Status: DC | PRN
Start: 2020-06-11 — End: 2020-06-25

## 2020-06-11 NOTE — Progress Notes (Signed)
CRITICAL VALUE STICKER  CRITICAL VALUE: Bili 3.6  RECEIVER (on-site recipient of call): Michelina Mexicano,RN  DATE & TIME NOTIFIED: 06/11/20 @ 0955  MESSENGER (representative from lab): Tomi Bamberger  MD NOTIFIED: Ned Card, NP  TIME OF NOTIFICATION: [phone] RESPONSE:  Seen and examined. Proceed w/tx as scheduled.

## 2020-06-11 NOTE — Progress Notes (Signed)
Per Ned Card, NP, ok to treat with Scr 2.14 and total bili 3.6.

## 2020-06-11 NOTE — Progress Notes (Addendum)
[facility] OFFICE PROGRESS NOTE   Diagnosis: Hepatocellular carcinoma  INTERVAL HISTORY:   Dillon Perkins returns as scheduled.  He completed cycle 4 nivolumab 05/27/2020.  He denies nausea/vomiting.  No diarrhea.  No rash.  Main complaint is dizziness with standing.  He reports this began when he started taking Flomax.  He discontinued Flomax and notes significant improvement but continues to have dizziness when he stands.  Objective:  Vital signs in last 24 hours:  Blood pressure 140/80, pulse 98, temperature 97.8 F (36.6 C), temperature source Tympanic, resp. rate 20, height 5\' 8"  (1.727 m), weight 206 lb 1.6 oz (93.5 kg), SpO2 99 %.    HEENT: No thrush or ulcers.  Mucous membranes are moist. Resp: Lungs clear bilaterally. Cardio: Regular rate and rhythm. GI: Abdomen is mildly distended.  No hepatomegaly. Vascular: No leg edema.    Lab Results:  Lab Results  Component Value Date   WBC 4.9 06/11/2020   HGB 12.6 (L) 06/11/2020   HCT 37.2 (L) 06/11/2020   MCV 103.0 (H) 06/11/2020   PLT 127 (L) 06/11/2020   NEUTROABS 3.1 06/11/2020    Imaging:  No results found.  Medications: I have reviewed the patient's current medications.  Assessment/Plan: 1. Hepatocellular carcinoma ? Multiple Li-Rad 5 liver lesions noted on a CT abdomen/pelvis 01/02/2020 including a 7.1 x 5.7 cm segment 2 lesion, there are right and left hepatic lesions ? CT abdomen/pelvis 01/02/2020-cirrhosis with varices, multiple LR 5 liver lesions, 1.1 cm fluid density lesion in the pancreas head, splenomegaly, bilateral kidney stones, small amount of right perihepatic ascites ? MRI liver 02/08/2020-cirrhosis, numerous hyperenhancing liver lesions, largest lesion in segment two measuring 7.9 x 6.1 cm, lesions are consistent with multifocal hepatocellular carcinoma, the three largest lesions are categorized as LI-RADS5. Fluid signal cystic lesions in the pancreas consistent with small sidebranch  IPMN's, gastroesophageal varices, trace perihepatic ascites ? Y90 left hepatic lobe 03/01/2020 ? Cycle 1 nivolumab 04/08/2020 ? Cycle 2 nivolumab 04/22/2020 ? Cycle 3 nivolumab 05/13/2020 ? Cycle 4 nivolumab 05/27/2020 ? Cycle 5 nivolumab 06/11/2020 2. 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 Hypertension 4. BPH 5. Hyperlipidemia 6. History of heavy alcohol use 7. Remote history of tobacco use 8. Renal insufficiency 9. Microscopic hematuria 10. 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 Esophageal mucosal changes consistent with long segment Barrett's esophagus present in the distal esophagus. Severe portal hypertensive gastropathy found in the entire examined stomach. Small hiatal hernia. 11. Mild right hydronephrosis with a probable calculus in the proximal third of the right ureter on the MRI 03/06/2020, renal ultrasound 04/01/2020-multiple nonobstructing bilateral renal calculi  05/22/2020-laser lithotripsy, bilateral ureter stent placement 12.  Atrial fibrillation 13.  Dizziness-improved with discontinuation of Flomax.  Not orthostatic 06/11/2020.   Disposition: Dillon Perkins appears stable.  He has completed 4 cycles of nivolumab.  Plan to proceed with cycle 5 today as scheduled.  We will follow-up on the AFP from today.  We reviewed the CBC and chemistry panel from today.  Labs adequate to proceed with treatment.  Stable elevation of bilirubin and creatinine.  He began experiencing dizziness with Flomax.  Since discontinuing Flomax the dizziness has improved considerably though he continues to note some dizziness with standing.  He was not orthostatic in the office today.  We discussed slow position change.  We are adding a cortisol level to  today's labs.  He will return for lab, follow-up, cycle 6 nivolumab in 2  weeks.  Patient seen with Dr. Benay Spice.  Dillon Perkins ANP/GNP-BC   06/11/2020  11:12 AM This was a shared visit with Dillon Perkins.  [name] appears to be tolerating the nivolumab well.  I suspect the "dizziness "is related to Flomax and the diuretic regimen.  The plan is to continue nivolumab.  Julieanne Manson, MD

## 2020-06-11 NOTE — Patient Instructions (Signed)
[facility] Discharge Instructions for Patients Receiving Chemotherapy  Today you received the following chemotherapy agents: nivolumab.  To help prevent nausea and vomiting after your treatment, we encourage you to take your nausea medication as directed.   If you develop nausea and vomiting that is not controlled by your nausea medication, call the clinic.   BELOW ARE SYMPTOMS THAT SHOULD BE REPORTED IMMEDIATELY:  *FEVER GREATER THAN 100.5 F  *CHILLS WITH OR WITHOUT FEVER  NAUSEA AND VOMITING THAT IS NOT CONTROLLED WITH YOUR NAUSEA MEDICATION  *UNUSUAL SHORTNESS OF BREATH  *UNUSUAL BRUISING OR BLEEDING  TENDERNESS IN MOUTH AND THROAT WITH OR WITHOUT PRESENCE OF ULCERS  *URINARY PROBLEMS  *BOWEL PROBLEMS  UNUSUAL RASH Items with * indicate a potential emergency and should be followed up as soon as possible.  Feel free to call the clinic should you have any questions or concerns. The clinic phone number is [phone].  Please show the CHEMO ALERT CARD at check-in to the Emergency Department and triage nurse.   

## 2020-06-12 ENCOUNTER — Telehealth: Payer: Self-pay | Admitting: Oncology

## 2020-06-12 LAB — AFP TUMOR MARKER: AFP, Serum, Tumor Marker: 9.3 ng/mL — ABNORMAL HIGH (ref 0.0–8.3)

## 2020-06-12 NOTE — Telephone Encounter (Signed)
Scheduled appointments per 127 los. Called patient, no answer. Left message for patient with appointment date and time.

## 2020-06-23 ENCOUNTER — Other Ambulatory Visit: Payer: Self-pay | Admitting: Oncology

## 2020-06-25 ENCOUNTER — Inpatient Hospital Stay: Payer: Medicare Other

## 2020-06-25 ENCOUNTER — Other Ambulatory Visit: Payer: Self-pay

## 2020-06-25 ENCOUNTER — Telehealth: Payer: Self-pay | Admitting: *Deleted

## 2020-06-25 ENCOUNTER — Inpatient Hospital Stay (HOSPITAL_BASED_OUTPATIENT_CLINIC_OR_DEPARTMENT_OTHER): Payer: Medicare Other | Admitting: Oncology

## 2020-06-25 ENCOUNTER — Ambulatory Visit: Payer: Medicare Other

## 2020-06-25 VITALS — BP 118/74 | HR 58 | Temp 97.0°F | Resp 17 | Ht 68.0 in | Wt 206.7 lb

## 2020-06-25 DIAGNOSIS — C220 Liver cell carcinoma: Secondary | ICD-10-CM

## 2020-06-25 DIAGNOSIS — K769 Liver disease, unspecified: Secondary | ICD-10-CM

## 2020-06-25 DIAGNOSIS — Z5112 Encounter for antineoplastic immunotherapy: Secondary | ICD-10-CM | POA: Diagnosis not present

## 2020-06-25 LAB — CBC WITH DIFFERENTIAL (CANCER CENTER ONLY)
Abs Immature Granulocytes: 0.1 10*3/uL — ABNORMAL HIGH (ref 0.00–0.07)
Basophils Absolute: 0.1 10*3/uL (ref 0.0–0.1)
Basophils Relative: 0 %
Eosinophils Absolute: 0 10*3/uL (ref 0.0–0.5)
Eosinophils Relative: 0 %
HCT: 33.9 % — ABNORMAL LOW (ref 39.0–52.0)
Hemoglobin: 11.7 g/dL — ABNORMAL LOW (ref 13.0–17.0)
Immature Granulocytes: 1 %
Lymphocytes Relative: 7 %
Lymphs Abs: 0.8 10*3/uL (ref 0.7–4.0)
MCH: 35.2 pg — ABNORMAL HIGH (ref 26.0–34.0)
MCHC: 34.5 g/dL (ref 30.0–36.0)
MCV: 102.1 fL — ABNORMAL HIGH (ref 80.0–100.0)
Monocytes Absolute: 1.1 10*3/uL — ABNORMAL HIGH (ref 0.1–1.0)
Monocytes Relative: 8 %
Neutro Abs: 10.6 10*3/uL — ABNORMAL HIGH (ref 1.7–7.7)
Neutrophils Relative %: 84 %
Platelet Count: 148 10*3/uL — ABNORMAL LOW (ref 150–400)
RBC: 3.32 MIL/uL — ABNORMAL LOW (ref 4.22–5.81)
RDW: 15.9 % — ABNORMAL HIGH (ref 11.5–15.5)
WBC Count: 12.7 10*3/uL — ABNORMAL HIGH (ref 4.0–10.5)
nRBC: 0 % (ref 0.0–0.2)

## 2020-06-25 LAB — CMP (CANCER CENTER ONLY)
ALT: 29 U/L (ref 0–44)
AST: 39 U/L (ref 15–41)
Albumin: 2.2 g/dL — ABNORMAL LOW (ref 3.5–5.0)
Alkaline Phosphatase: 143 U/L — ABNORMAL HIGH (ref 38–126)
Anion gap: 8 (ref 5–15)
BUN: 54 mg/dL — ABNORMAL HIGH (ref 8–23)
CO2: 20 mmol/L — ABNORMAL LOW (ref 22–32)
Calcium: 8.6 mg/dL — ABNORMAL LOW (ref 8.9–10.3)
Chloride: 106 mmol/L (ref 98–111)
Creatinine: 2.67 mg/dL — ABNORMAL HIGH (ref 0.61–1.24)
GFR, Estimated: 25 mL/min — ABNORMAL LOW (ref >60)
Glucose, Bld: 176 mg/dL — ABNORMAL HIGH (ref 70–99)
Potassium: 3.8 mmol/L (ref 3.5–5.1)
Sodium: 134 mmol/L — ABNORMAL LOW (ref 135–145)
Total Bilirubin: 4.4 mg/dL (ref 0.3–1.2)
Total Protein: 7 g/dL (ref 6.5–8.1)

## 2020-06-25 LAB — AMMONIA: Ammonia: 19 umol/L (ref 9–35)

## 2020-06-25 NOTE — Progress Notes (Signed)
[facility] OFFICE PROGRESS NOTE   Diagnosis: Hepatocellular carcinoma  INTERVAL HISTORY:   [name] completed another treatment nivolumab on 06/11/2020.  He reports malaise and somnolence over the past few weeks.  He takes lorazepam at night.  He has been taking a double dose.  He is sleeping during the day.  No dyspnea or pain.  Good appetite.  Objective:  Vital signs in last 24 hours:  Blood pressure 118/74, pulse (!) 58, temperature (!) 97 F (36.1 C), temperature source Tympanic, resp. rate 17, height 5\' 8"  (1.727 m), weight 206 lb 11.2 oz (93.8 kg), SpO2 100 %. HEENT: Scleral icterus Resp: Lungs clear bilaterally Cardio: Regular rate and rhythm GI: Mildly distended with ascites, no hepatosplenomegaly, nontender Vascular: No leg edema Neuro: Alert and oriented   Lab Results:  Lab Results  Component Value Date   WBC 12.7 (H) 06/25/2020   HGB 11.7 (L) 06/25/2020   HCT 33.9 (L) 06/25/2020   MCV 102.1 (H) 06/25/2020   PLT 148 (L) 06/25/2020   NEUTROABS 10.6 (H) 06/25/2020    CMP  Lab Results  Component Value Date   NA 134 (L) 06/25/2020   K 3.8 06/25/2020   CL 106 06/25/2020   CO2 20 (L) 06/25/2020   GLUCOSE 176 (H) 06/25/2020   BUN 54 (H) 06/25/2020   CREATININE 2.67 (H) 06/25/2020   CALCIUM 8.6 (L) 06/25/2020   PROT 7.0 06/25/2020   ALBUMIN 2.2 (L) 06/25/2020   AST 39 06/25/2020   ALT 29 06/25/2020   ALKPHOS 143 (H) 06/25/2020   BILITOT 4.4 (HH) 06/25/2020   GFRNONAA 25 (L) 06/25/2020   GFRAA 37 (L) 04/08/2020   Medications: I have reviewed the patient's current medications.   Assessment/Plan: 1. Hepatocellular carcinoma ? Multiple Li-Rad 5 liver lesions noted on a CT abdomen/pelvis 01/02/2020 including a 7.1 x 5.7 cm segment 2 lesion, there are right and left hepatic lesions ? CT abdomen/pelvis 01/02/2020-cirrhosis with varices, multiple LR 5 liver lesions, 1.1 cm fluid density lesion in the pancreas head, splenomegaly, bilateral kidney  stones, small amount of right perihepatic ascites ? MRI liver 02/08/2020-cirrhosis, numerous hyperenhancing liver lesions, largest lesion in segment two measuring 7.9 x 6.1 cm, lesions are consistent with multifocal hepatocellular carcinoma, the three largest lesions are categorized as LI-RADS5. 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 Y90 left hepatic lobe 03/01/2020 ? Cycle 1 nivolumab 04/08/2020 ? Cycle 2 nivolumab 04/22/2020 ? Cycle 3 nivolumab 05/13/2020 ? Cycle 4 nivolumab 05/27/2020 ? Cycle 5 nivolumab 06/11/2020 2. 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 Hypertension 4. BPH 5. Hyperlipidemia 6. History of heavy alcohol use 7. Remote history of tobacco use 8. Renal insufficiency 9. Microscopic hematuria 10. 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 Esophageal mucosal changes consistent with long segment Barrett's esophagus present in the distal esophagus. Severe portal hypertensive gastropathy found in the entire examined stomach. Small hiatal hernia. 11. Mild right hydronephrosis with a probable calculus in the proximal third of the right ureter on the MRI 03/06/2020, renal ultrasound 04/01/2020-multiple nonobstructing bilateral renal calculi  05/22/2020-laser lithotripsy, bilateral ureter stent placement 12.  Atrial fibrillation 13.  Dizziness-improved with discontinuation of Flomax.  Not orthostatic 06/11/2020.    Disposition: [name] has hepatocellular carcinoma.  He has multiple comorbid conditions including cirrhosis and renal failure.  He has developed failure to thrive over the past few weeks.  The bilirubin is higher.  I am concerned his symptoms are related to progression of the hepatocellular carcinoma.  Edema has  improved with spironolactone.  We will check an ammonia level today.  He will be referred for a restaging MRI and return for an office visit as scheduled in 2 weeks.  He will try Benadryl instead of lorazepam for sleep.  He will contact us in the interim for new symptoms.  Betsy Coder, MD  06/25/2020  12:56 PM

## 2020-06-25 NOTE — Telephone Encounter (Signed)
Called wife with normal ammonia level and w/MRI appointment at [facility] on 07/04/20 at 0630/0700 and NPO 4 hours prior.

## 2020-07-03 ENCOUNTER — Telehealth: Payer: Self-pay

## 2020-07-03 ENCOUNTER — Telehealth: Payer: Self-pay | Admitting: Gastroenterology

## 2020-07-03 NOTE — Telephone Encounter (Signed)
Patient's wife [name] calls very concerned over him at this point.  She states for the past week he has barely eaten.  He is drinking fluids but states when he tries to eat everything tastes like "oil" and he gags and throws it up.  He is dizzy.  Either goes from recliner to bed and back.    She did contact Dr. Lynne Leader office but was told he would not be able to see him until February.  She is asking for a referral to a different GI doctor. He is scheduled for MRI abdomen tomorrow.   She is asking if she needs to take him to the ED.

## 2020-07-03 NOTE — Telephone Encounter (Signed)
Attempted to return call.  Phone picked up but person on the other end could not hear me.  She hung up.  I called right back and had to leave a VM.  See notes from oncology, it is recommended patient go to the ED for eval.

## 2020-07-03 NOTE — Telephone Encounter (Signed)
I called patient's wife back and informed her per Dr. Benay Spice his recommendation is for him to go to [facility] ED to be evaluated.  She states since she has talked to me that he has eaten a piece of ham and drank an Ensure.  She has decided to monitor his input today, have the MRI tomorrow and if no better then go to ED.  I re-emphasized Dr. Gearldine Shown recommendation to go to ED. She verbalized an understanding.

## 2020-07-04 ENCOUNTER — Emergency Department (HOSPITAL_COMMUNITY)
Admission: EM | Admit: 2020-07-04 | Discharge: 2020-07-04 | Disposition: A | Discharge status: Home / Self Care | Payer: Medicare Other | Attending: Emergency Medicine | Admitting: Emergency Medicine

## 2020-07-04 ENCOUNTER — Emergency Department (HOSPITAL_COMMUNITY)
Admission: RE | Admit: 2020-07-04 | Discharge: 2020-07-04 | Disposition: A | Discharge status: Home / Self Care | Payer: Medicare Other | Source: Ambulatory Visit | Attending: Oncology | Admitting: Oncology

## 2020-07-04 ENCOUNTER — Other Ambulatory Visit: Payer: Self-pay

## 2020-07-04 ENCOUNTER — Encounter (HOSPITAL_COMMUNITY): Payer: Self-pay

## 2020-07-04 DIAGNOSIS — R630 Anorexia: Secondary | ICD-10-CM | POA: Insufficient documentation

## 2020-07-04 DIAGNOSIS — I10 Essential (primary) hypertension: Secondary | ICD-10-CM | POA: Insufficient documentation

## 2020-07-04 DIAGNOSIS — I4891 Unspecified atrial fibrillation: Secondary | ICD-10-CM | POA: Diagnosis not present

## 2020-07-04 DIAGNOSIS — R531 Weakness: Secondary | ICD-10-CM

## 2020-07-04 DIAGNOSIS — Z87891 Personal history of nicotine dependence: Secondary | ICD-10-CM | POA: Diagnosis not present

## 2020-07-04 DIAGNOSIS — K862 Cyst of pancreas: Secondary | ICD-10-CM | POA: Diagnosis not present

## 2020-07-04 DIAGNOSIS — Z8505 Personal history of malignant neoplasm of liver: Secondary | ICD-10-CM | POA: Diagnosis not present

## 2020-07-04 DIAGNOSIS — K8020 Calculus of gallbladder without cholecystitis without obstruction: Secondary | ICD-10-CM | POA: Diagnosis not present

## 2020-07-04 DIAGNOSIS — Z20822 Contact with and (suspected) exposure to covid-19: Secondary | ICD-10-CM | POA: Insufficient documentation

## 2020-07-04 DIAGNOSIS — C220 Liver cell carcinoma: Secondary | ICD-10-CM

## 2020-07-04 DIAGNOSIS — R439 Unspecified disturbances of smell and taste: Secondary | ICD-10-CM | POA: Diagnosis not present

## 2020-07-04 DIAGNOSIS — N390 Urinary tract infection, site not specified: Secondary | ICD-10-CM | POA: Insufficient documentation

## 2020-07-04 DIAGNOSIS — M6281 Muscle weakness (generalized): Secondary | ICD-10-CM | POA: Diagnosis not present

## 2020-07-04 DIAGNOSIS — I482 Chronic atrial fibrillation, unspecified: Secondary | ICD-10-CM | POA: Diagnosis not present

## 2020-07-04 DIAGNOSIS — K7689 Other specified diseases of liver: Secondary | ICD-10-CM | POA: Diagnosis not present

## 2020-07-04 DIAGNOSIS — N179 Acute kidney failure, unspecified: Secondary | ICD-10-CM

## 2020-07-04 DIAGNOSIS — Z79899 Other long term (current) drug therapy: Secondary | ICD-10-CM | POA: Diagnosis not present

## 2020-07-04 LAB — URINALYSIS, ROUTINE W REFLEX MICROSCOPIC
Bilirubin Urine: NEGATIVE
Glucose, UA: NEGATIVE mg/dL
Ketones, ur: NEGATIVE mg/dL
Nitrite: NEGATIVE
Protein, ur: 30 mg/dL — AB
Specific Gravity, Urine: 1.016 (ref 1.005–1.030)
WBC, UA: >50 WBC/hpf — ABNORMAL HIGH (ref 0–5)
pH: 5 (ref 5.0–8.0)

## 2020-07-04 LAB — CBC WITH DIFFERENTIAL/PLATELET
Abs Immature Granulocytes: 0.11 10*3/uL — ABNORMAL HIGH (ref 0.00–0.07)
Basophils Absolute: 0.1 10*3/uL (ref 0.0–0.1)
Basophils Relative: 0 %
Eosinophils Absolute: 0.1 10*3/uL (ref 0.0–0.5)
Eosinophils Relative: 1 %
HCT: 36.4 % — ABNORMAL LOW (ref 39.0–52.0)
Hemoglobin: 11.8 g/dL — ABNORMAL LOW (ref 13.0–17.0)
Immature Granulocytes: 1 %
Lymphocytes Relative: 7 %
Lymphs Abs: 0.9 10*3/uL (ref 0.7–4.0)
MCH: 35.3 pg — ABNORMAL HIGH (ref 26.0–34.0)
MCHC: 32.4 g/dL (ref 30.0–36.0)
MCV: 109 fL — ABNORMAL HIGH (ref 80.0–100.0)
Monocytes Absolute: 0.8 10*3/uL (ref 0.1–1.0)
Monocytes Relative: 6 %
Neutro Abs: 11.3 10*3/uL — ABNORMAL HIGH (ref 1.7–7.7)
Neutrophils Relative %: 85 %
Platelets: 192 10*3/uL (ref 150–400)
RBC: 3.34 MIL/uL — ABNORMAL LOW (ref 4.22–5.81)
RDW: 16.3 % — ABNORMAL HIGH (ref 11.5–15.5)
WBC: 13.2 10*3/uL — ABNORMAL HIGH (ref 4.0–10.5)
nRBC: 0 % (ref 0.0–0.2)

## 2020-07-04 LAB — MAGNESIUM: Magnesium: 2 mg/dL (ref 1.7–2.4)

## 2020-07-04 LAB — COMPREHENSIVE METABOLIC PANEL
ALT: 34 U/L (ref 0–44)
AST: 43 U/L — ABNORMAL HIGH (ref 15–41)
Albumin: 2.4 g/dL — ABNORMAL LOW (ref 3.5–5.0)
Alkaline Phosphatase: 134 U/L — ABNORMAL HIGH (ref 38–126)
Anion gap: 11 (ref 5–15)
BUN: 61 mg/dL — ABNORMAL HIGH (ref 8–23)
CO2: 20 mmol/L — ABNORMAL LOW (ref 22–32)
Calcium: 8.6 mg/dL — ABNORMAL LOW (ref 8.9–10.3)
Chloride: 106 mmol/L (ref 98–111)
Creatinine, Ser: 3.17 mg/dL — ABNORMAL HIGH (ref 0.61–1.24)
GFR, Estimated: 20 mL/min — ABNORMAL LOW (ref >60)
Glucose, Bld: 165 mg/dL — ABNORMAL HIGH (ref 70–99)
Potassium: 4.5 mmol/L (ref 3.5–5.1)
Sodium: 137 mmol/L (ref 135–145)
Total Bilirubin: 4.3 mg/dL — ABNORMAL HIGH (ref 0.3–1.2)
Total Protein: 7.6 g/dL (ref 6.5–8.1)

## 2020-07-04 LAB — PROTIME-INR
INR: 1.5 — ABNORMAL HIGH (ref 0.8–1.2)
Prothrombin Time: 17.4 seconds — ABNORMAL HIGH (ref 11.4–15.2)

## 2020-07-04 LAB — LACTIC ACID, PLASMA: Lactic Acid, Venous: 2.5 mmol/L (ref 0.5–1.9)

## 2020-07-04 LAB — BRAIN NATRIURETIC PEPTIDE: B Natriuretic Peptide: 2740.9 pg/mL — ABNORMAL HIGH (ref 0.0–100.0)

## 2020-07-04 LAB — PHOSPHORUS: Phosphorus: 3.9 mg/dL (ref 2.5–4.6)

## 2020-07-04 LAB — LIPASE, BLOOD: Lipase: 64 U/L — ABNORMAL HIGH (ref 11–51)

## 2020-07-04 LAB — TROPONIN I (HIGH SENSITIVITY): Troponin I (High Sensitivity): 14 ng/L (ref <18)

## 2020-07-04 LAB — SARS CORONAVIRUS 2 (TAT 6-24 HRS): SARS Coronavirus 2: NEGATIVE

## 2020-07-04 MED ORDER — CEPHALEXIN 500 MG PO CAPS: 1000.0000 mg | ORAL_CAPSULE | Freq: Two times a day (BID) | ORAL | 0 refills | Status: DC

## 2020-07-04 MED ORDER — GADOBUTROL 1 MMOL/ML IV SOLN
9.0000 mL | Freq: Once | INTRAVENOUS | Status: AC | PRN
  Administered 2020-07-04: 9 mL via INTRAVENOUS

## 2020-07-04 MED ORDER — SODIUM CHLORIDE 0.9 % IV SOLN
1.0000 g | Freq: Once | INTRAVENOUS | Status: AC
  Administered 2020-07-04: 1 g via INTRAVENOUS
  Filled 2020-07-04: qty 10

## 2020-07-04 MED ORDER — LACTATED RINGERS IV SOLN: INTRAVENOUS | Status: DC

## 2020-07-04 MED ORDER — LACTATED RINGERS IV BOLUS
500.0000 mL | Freq: Once | INTRAVENOUS | Status: AC
  Administered 2020-07-04: 500 mL via INTRAVENOUS

## 2020-07-04 NOTE — ED Notes (Signed)
Date and time results received: 07/04/20 11:28 AM   Test: lactic acid Critical Value: 2.5  Name of Provider Notified: Ezzie Dural MD  Orders Received? Or Actions Taken?: informed provider. Received orders for 569mL LR bolus and repeat lactic

## 2020-07-04 NOTE — ED Triage Notes (Signed)
Pt presents with c/o loss of taste for several days. Pt reports he is unable to eat anything because nothing tastes the same. Pt reports he is a current cancer pt, last chemo was on 12/24.

## 2020-07-04 NOTE — ED Provider Notes (Signed)
[facility] DEPT Provider Note   CSN: [id number] Arrival date & time: 07/04/20  0539     History Chief Complaint  Patient presents with  . Loss of taste  . Decreased appetite    Dillon Perkins is a 69 y.o. male.  HPI  For Dr. Benay Spice (oncology) note from 12\12\2021:  Dillon Perkins has hepatocellular carcinoma.  He has multiple comorbid conditions including cirrhosis and renal failure. He has developed failure to thrive over the past few weeks.  The bilirubin is higher.  I am concerned his symptoms are related to progression of the hepatocellular carcinoma.  Edema has improved with spironolactone.   Patient presents emergency department today per recommendation of oncology.  Patient had outpatient, scheduled MRI of the abdomen for surveillance of hepatocellular carcinoma this morning.  He has predominantly come to the emergency department because he has had significant loss of oral intake due to dysgeusia.  Patient reports that all foods now just are tasteless or tastes like oil.  He reports if he puts a piece of English muffin in his mouth, all of the moisture immediately leaves his mouth and its like he has a nonfood item in his mouth that feels foreign.  He reports he tolerates citrus pretty well.  He has also been able to tolerate Glucerna supplements.  Patient reports he has had 40 pound weight loss due to this condition.  He has completed 4 cycles of nivolumab.  He is not having vomiting or copious diarrhea.  No fevers or chills.  He reports he has had some abdominal distention but less than previously.  He reports he was having some problems with peripheral edema but that is resolved since starting spironolactone.  He has a chronic problem with dizziness that he describes as a slow vertiginous quality.  Amount of dizziness and weakness has improved since discontinuing Flomax.  But this remains a problem as well.  No recent syncope.    Past Medical  History:  Diagnosis Date  . Alcoholic cirrhosis ([place])   . Atrial fibrillation ([place])   . GERD (gastroesophageal reflux disease)   . Hepatocellular carcinoma ([place])   . History of chemotherapy taking immunotherapy   last dose 05-13-2020  . History of kidney stones   . Hyperlipidemia   . Hypertension     Patient Active Problem List   Diagnosis Date Noted  . Orthostatic hypotension 06/04/2020  . Kidney stones 05/22/2020  . Hepatocellular carcinoma ([place]) 03/26/2020  . Goals of care, counseling/discussion 03/26/2020  . Educated about COVID-19 virus infection 11/07/2018  . Dilated cardiomyopathy ([place]) 11/07/2018  . SOB (shortness of breath) 11/07/2018  . Chest pain   . Acute renal insufficiency   . Paroxysmal atrial fibrillation ([facility])   . Bradycardia 07/22/2014  . Exercise intolerance 09/24/2012  . Abnormal CXR 09/06/2012  . Abnormal LFTs (liver function tests) 02/07/2012  . Hyperlipidemia 02/07/2012  . Atrial fibrillation ([place]) 01/31/2012  . Snoring 01/31/2012  . Essential hypertension 08/29/2010    Past Surgical History:  Procedure Laterality Date  . APPENDECTOMY  yrs ago  . CYSTOSCOPY/URETEROSCOPY/HOLMIUM LASER/STENT PLACEMENT Bilateral 05/22/2020   Procedure: CYSTOSCOPY/RETROGRADE/URETEROSCOPY/HOLMIUM LASER/STENT PLACEMENT STONE BASKET RETRIVAL ;  Surgeon: Ceasar Mons, MD;  Location: [facility];  Service: Urology;  Laterality: Bilateral;  . IR ANGIOGRAM SELECTIVE EACH ADDITIONAL VESSEL  02/19/2020  . IR ANGIOGRAM SELECTIVE EACH ADDITIONAL VESSEL  02/19/2020  . IR ANGIOGRAM SELECTIVE EACH ADDITIONAL VESSEL  02/19/2020  . IR ANGIOGRAM SELECTIVE EACH ADDITIONAL VESSEL  02/19/2020  . IR ANGIOGRAM SELECTIVE EACH ADDITIONAL VESSEL  02/19/2020  . IR ANGIOGRAM SELECTIVE EACH ADDITIONAL VESSEL  02/19/2020  . IR ANGIOGRAM SELECTIVE EACH ADDITIONAL VESSEL  03/01/2020  . IR ANGIOGRAM SELECTIVE EACH ADDITIONAL VESSEL  03/01/2020  . IR ANGIOGRAM SELECTIVE EACH  ADDITIONAL VESSEL  03/01/2020  . IR ANGIOGRAM VISCERAL SELECTIVE  02/19/2020  . IR ANGIOGRAM VISCERAL SELECTIVE  03/01/2020  . IR EMBO ARTERIAL NOT HEMORR HEMANG INC GUIDE ROADMAPPING  02/19/2020  . IR EMBO TUMOR ORGAN ISCHEMIA INFARCT INC GUIDE ROADMAPPING  02/19/2020  . IR EMBO TUMOR ORGAN ISCHEMIA INFARCT INC GUIDE ROADMAPPING  03/01/2020  . IR RADIOLOGIST EVAL & MGMT  01/30/2020  . IR US GUIDE VASC ACCESS RIGHT  02/19/2020  . IR US GUIDE VASC ACCESS RIGHT  03/01/2020       Family History  Problem Relation Age of Onset  . Heart disease Father 49       Heart attack  . Heart disease Sister 2       Heart attack  . Breast cancer Mother   . Colon cancer Neg Hx   . Stomach cancer Neg Hx   . Pancreatic cancer Neg Hx   . Rectal cancer Neg Hx   . Esophageal cancer Neg Hx     Social History   Tobacco Use  . Smoking status: Former Smoker    Packs/day: 1.00    Years: 0.50    Pack years: 0.50    Types: Cigarettes    Quit date: 08/21/1972    Years since quitting: 47.9  . Smokeless tobacco: Never Used  Vaping Use  . Vaping Use: Never used  Substance Use Topics  . Alcohol use: Not Currently    Alcohol/week: 2.0 standard drinks    Types: 2 Cans of beer per week  . Drug use: No    Home Medications Prior to Admission medications   Medication Sig Start Date End Date Taking? Authorizing Provider  Coenzyme Q10 (COQ10) 100 MG CAPS Take 100 mg by mouth daily.    Yes [provider]  Ibuprofen-diphenhydrAMINE Cit (ADVIL PM PO) Take 2 tablets by mouth at bedtime.   Yes [provider]  pantoprazole (PROTONIX) 40 MG tablet Take 1 tablet (40 mg total) by mouth daily. 03/19/20  Yes Ladene Artist, MD  pravastatin (PRAVACHOL) 40 MG tablet Take 40 mg by mouth at bedtime.  09/26/18  Yes [provider]  spironolactone (ALDACTONE) 50 MG tablet Take 0.5 tablets (25 mg total) by mouth daily. 05/24/20  Yes Isaiah Serge, NP  cephALEXin (KEFLEX) 500 MG capsule Take 2 capsules  (1,000 mg total) by mouth 2 (two) times daily. 07/04/20  Yes Charlesetta Shanks, MD  LORazepam (ATIVAN) 0.5 MG tablet Take 0.5 tablets (0.25 mg total) by mouth at bedtime as needed for anxiety or sleep. Patient not taking: Reported on 07/04/2020 06/11/20   Owens Shark, NP    Allergies    Losartan potassium  Review of Systems   Review of Systems 10 systems reviewed and negative except as per HPI Physical Exam Updated Vital Signs BP (!) 141/94   Pulse (!) 157   Temp (!) 97.5 F (36.4 C) (Oral)   Resp 18   Ht 5\' 8"  (1.727 m)   Wt 91.6 kg   SpO2 94%   BMI 30.71 kg/m   Physical Exam Constitutional:      Comments: Patient is alert and nontoxic.  He is up and ambulatory about the room.  He is  in no acute distress.  No respiratory distress.  Patient is not cachectic in appearance.  Subtle jaundice.  HENT:     Mouth/Throat:     Mouth: Mucous membranes are moist.     Pharynx: Oropharynx is clear.     Comments: His membranes are pink and moist without lesions.  Dentition is in good condition.  Posterior airway is widely patent. Eyes:     Extraocular Movements: Extraocular movements intact.     Comments: Very mild icterus  Cardiovascular:     Comments: Irregularly irregular tachycardia. Pulmonary:     Effort: Pulmonary effort is normal.     Breath sounds: Normal breath sounds.  Abdominal:     Comments: Abdomen is mildly distended and protuberant.  Abdomen is soft.  No appreciable fluid wave.  Nontender.  No striae or varices of abdominal wall  Musculoskeletal:        General: No swelling or tenderness. Normal range of motion.     Cervical back: Neck supple.     Right lower leg: No edema.     Left lower leg: No edema.     Comments: No peripheral edema.  Skin condition of the lower legs is good.  Calves are soft and nontender.  Skin:    General: Skin is warm and dry.  Neurological:     General: No focal deficit present.     Mental Status: He is oriented to person, place, and  time.     Cranial Nerves: No cranial nerve deficit.     Coordination: Coordination normal.  Psychiatric:        Mood and Affect: Mood normal.     ED Results / Procedures / Treatments   Labs (all labs ordered are listed, but only abnormal results are displayed) Labs Reviewed  COMPREHENSIVE METABOLIC PANEL - Abnormal; Notable for the following components:      Result Value   CO2 20 (*)    Glucose, Bld 165 (*)    BUN 61 (*)    Creatinine, Ser 3.17 (*)    Calcium 8.6 (*)    Albumin 2.4 (*)    AST 43 (*)    Alkaline Phosphatase 134 (*)    Total Bilirubin 4.3 (*)    GFR, Estimated 20 (*)    All other components within normal limits  LIPASE, BLOOD - Abnormal; Notable for the following components:   Lipase 64 (*)    All other components within normal limits  BRAIN NATRIURETIC PEPTIDE - Abnormal; Notable for the following components:   B Natriuretic Peptide 2,740.9 (*)    All other components within normal limits  LACTIC ACID, PLASMA - Abnormal; Notable for the following components:   Lactic Acid, Venous 2.5 (*)    All other components within normal limits  CBC WITH DIFFERENTIAL/PLATELET - Abnormal; Notable for the following components:   WBC 13.2 (*)    RBC 3.34 (*)    Hemoglobin 11.8 (*)    HCT 36.4 (*)    MCV 109.0 (*)    MCH 35.3 (*)    RDW 16.3 (*)    Neutro Abs 11.3 (*)    Abs Immature Granulocytes 0.11 (*)    All other components within normal limits  PROTIME-INR - Abnormal; Notable for the following components:   Prothrombin Time 17.4 (*)    INR 1.5 (*)    All other components within normal limits  URINALYSIS, ROUTINE W REFLEX MICROSCOPIC - Abnormal; Notable for the following components:   APPearance CLOUDY (*)  Hgb urine dipstick SMALL (*)    Protein, ur 30 (*)    Leukocytes,Ua LARGE (*)    WBC, UA >50 (*)    Bacteria, UA RARE (*)    All other components within normal limits  SARS CORONAVIRUS 2 (TAT 6-24 HRS)  URINE CULTURE  MAGNESIUM  PHOSPHORUS   AMMONIA  TROPONIN I (HIGH SENSITIVITY)  TROPONIN I (HIGH SENSITIVITY)    EKG EKG Interpretation  Date/Time:  Thursday July 04 2020 10:21:36 EST Ventricular Rate:  158 PR Interval:    QRS Duration: 74 QT Interval:  294 QTC Calculation: 502 R Axis:   47 Text Interpretation: Atrial fibrillation with rapid V-rate Ventricular premature complex Borderline low voltage, extremity leads Repolarization abnormality, prob rate related agree, no sig change from previous Confirmed by Charlesetta Shanks [phone]) on 07/04/2020 10:27:34 AM   Radiology MR LIVER W WO CONTRAST  Result Date: 07/04/2020 CLINICAL DATA:  Cirrhosis with hepatocellular carcinoma. Restaging. Status post Y- 90 microsphere radioablation EXAM: MRI ABDOMEN WITHOUT AND WITH CONTRAST TECHNIQUE: Multiplanar multisequence MR imaging of the abdomen was performed both before and after the administration of intravenous contrast. CONTRAST:  61mL GADAVIST GADOBUTROL 1 MMOL/ML IV SOLN COMPARISON:  02/08/2019 FINDINGS: Lower chest: Small left pleural effusion, new. Hepatobiliary: Morphologic features of liver compatible with cirrhosis. Segment 2 lesion 4.5 by 5.3 cm, image 23/16. There is no signs of enhancement within the treated lesion consistent with nonviable tumor. Previously this measured 5.9 x 8.0 cm. Segment 3 lesion measures 2.6 x 2.5 cm, image 34/16. Persistent but decreased arterial phase enhancement is associated with this lesion. Previously this measured 3.1 x 3.5 cm. Previous lesion within inferior aspect of segment 3 lesion is no longer visualized. Previously this measured 1.8 x 1.4 cm. Index lesion in segment 5 measures 1.1 x 1.1 cm, image 31/16. Previously 1.5 x 1.4 cm. This continues to show arterial phase enhancement without evidence of washout or capsule. Segment 6 lesion adjacent to gallbladder fossa measures 5.3 x 3.2 cm, image 40/16. There is decreased arterial phase enhancement associated with this lesion and contrast to the  prior study which showed avid arterial phase enhancement. On the previous exam this measured 4.7 by 3.0 cm. Several additional subcentimeter hyperenhancing foci are too small to characterize. Small gallstone identified with sludge in the dependent portion of the gallbladder. No gallbladder inflammation or signs of biliary ductal dilatation. Pancreas: Unchanged cystic lesions within the pancreas. The largest is in the head measuring 1.1 cm, image 27/28. No signs of main duct dilatation, inflammation or mass. Spleen:  Within normal limits in size and appearance. Adrenals/Urinary Tract: Normal appearance of the adrenal glands. No hydronephrosis identified bilaterally. Stomach/Bowel: Visualized portions within the abdomen are unremarkable. Vascular/Lymphatic: Normal a scratch set aortic atherosclerosis. No aneurysm. Esophageal and gastric varices are noted. No adenopathy identified within the imaged portions of the upper abdomen. Other:  Interval development of moderate volume of ascites. Musculoskeletal: No suspicious bone lesions identified. IMPRESSION: 1. Morphologic features of liver compatible with cirrhosis. Stigmata of portal venous hypertension including varices and ascites. Ascites is new from previous exam. 2. Interval decrease in size and enhancement associated with treated lesions within left hepatic lobe. 3. Previously characterized LR-5 lesion within segment 6 adjacent to the gallbladder fossa is mildly increased in size when compared with the previous exam. 4. Small left pleural effusion, new. 5. Unchanged cystic lesions within the pancreas. Recommend continued attention on follow-up imaging. 6. Cholelithiasis. Electronically Signed   By: Queen Slough.D.  On: 07/04/2020 09:18    Procedures Procedures (including critical care time)  Medications Ordered in ED Medications  lactated ringers infusion ( Intravenous Stopped 07/04/20 1453)  cefTRIAXone (ROCEPHIN) 1 g in sodium chloride 0.9 % 100 mL  IVPB (1 g Intravenous New Bag/Given 07/04/20 1452)  lactated ringers bolus 500 mL (0 mLs Intravenous Stopped 07/04/20 1230)    ED Course  I have reviewed the triage vital signs and the nursing notes.  Pertinent labs & imaging results that were available during my care of the patient were reviewed by me and considered in my medical decision making (see chart for details).    MDM Rules/Calculators/A&P                         Consult: Reviewed with Dr. Benay Spice.  Patient can trial outpatient management with hydration and trying to increase his use of the diet supplements and Glucerna or Ensure.  Close follow-up will be obtained.  Patient presents as outlined.  He has has generalized weakness and decreased appetite.  He does have a complex medical history of hepatocellular carcinoma.  Patient is alert.  He is not showing signs of distress.  Blood pressures are stable without hypotension.  Patient has chronic atrial fibrillation that is untreated.  He reports his heart rate stayed elevated but he generally does not get symptoms.  He reports at baseline he has very limited energy and can get up to do some laundry in the house and some minor chores but that is about it.  No change in his baseline ability to do activity.  Patient has mild leukocytosis.  Urinalysis does appear positive although review of EMR indicates that prior urinalysis with significant WBCs did not have greater than 10,000 CFU bacteria.  At this time, with patient having some constitutional symptoms and leukocytosis will opt for empiric dose of Rocephin and follow-up with the cultures before empirically starting antibiotics.  Return precautions and follow-up plan reviewed.  Final Clinical Impression(s) / ED Diagnoses Final diagnoses:  AKI (acute kidney injury) ([place])  Chronic atrial fibrillation with RVR ([place])  Hepatocellular carcinoma ([facility])  General weakness  Urinary tract infection without hematuria, site unspecified    Rx / DC  Orders ED Discharge Orders         Ordered    cephALEXin (KEFLEX) 500 MG capsule  2 times daily        07/04/20 1503           Charlesetta Shanks, MD 07/04/20 1510

## 2020-07-04 NOTE — ED Notes (Signed)
Per Pfeiffer MD, discontinue repeat lactic acid

## 2020-07-04 NOTE — Discharge Instructions (Signed)
1.  You were given a dose of Rocephin in the emergency department.  This is for urinalysis that is suspicious for infection.  The IV medication Rocephin, work for 24 hours.  You have been given a prescription for Keflex which is an oral medication.  You can start this medication if your urine culture is positive. 2.  A urine culture has been ordered.  Results should be available at 24 and 48 hours.  If your culture does not grow significant bacteria indicating infection, you do not need to complete the antibiotics. 3.  You have chronic atrial fibrillation and are not taking any medication for it at this time.  If you start to get symptoms of shortness of breath, chest pressure or unusual lightheadedness compared to normal, return to the emergency department. 4.  Follow-up with Dr. Benay Spice and your urologist as planned.  You can call Dr. Laroy Apple office or your urologist office to discuss the results of your urine culture and to guide you further on antibiotic use.

## 2020-07-08 NOTE — Telephone Encounter (Signed)
Patient did end up going to the ED.  Was started on antibiotics.  Patient is not able to eat.  He is jaundiced and wife reports large amount of ascites.  He will come in and see [name] RNP on 07/11/20.  Family requests and appt with Dr. Fuller Plan as well for follow up.  That has been arranged for 08/01/20

## 2020-07-09 ENCOUNTER — Telehealth: Payer: Self-pay

## 2020-07-09 ENCOUNTER — Inpatient Hospital Stay: Payer: Medicare Other

## 2020-07-09 ENCOUNTER — Inpatient Hospital Stay: Payer: Medicare Other | Attending: Oncology

## 2020-07-09 ENCOUNTER — Inpatient Hospital Stay (HOSPITAL_BASED_OUTPATIENT_CLINIC_OR_DEPARTMENT_OTHER): Payer: Medicare Other | Admitting: Oncology

## 2020-07-09 ENCOUNTER — Other Ambulatory Visit: Payer: Self-pay

## 2020-07-09 ENCOUNTER — Other Ambulatory Visit: Payer: Self-pay | Admitting: *Deleted

## 2020-07-09 VITALS — BP 123/77 | HR 70 | Temp 97.0°F | Resp 18 | Ht 68.0 in | Wt 213.1 lb

## 2020-07-09 DIAGNOSIS — R300 Dysuria: Secondary | ICD-10-CM

## 2020-07-09 DIAGNOSIS — Z5112 Encounter for antineoplastic immunotherapy: Secondary | ICD-10-CM | POA: Insufficient documentation

## 2020-07-09 DIAGNOSIS — C220 Liver cell carcinoma: Secondary | ICD-10-CM

## 2020-07-09 DIAGNOSIS — Z5189 Encounter for other specified aftercare: Secondary | ICD-10-CM | POA: Insufficient documentation

## 2020-07-09 DIAGNOSIS — R3911 Hesitancy of micturition: Secondary | ICD-10-CM | POA: Diagnosis not present

## 2020-07-09 DIAGNOSIS — Z79899 Other long term (current) drug therapy: Secondary | ICD-10-CM | POA: Insufficient documentation

## 2020-07-09 LAB — URINALYSIS, COMPLETE (UACMP) WITH MICROSCOPIC
Bacteria, UA: NONE SEEN
Bilirubin Urine: NEGATIVE
Glucose, UA: NEGATIVE mg/dL
Ketones, ur: NEGATIVE mg/dL
Nitrite: NEGATIVE
Protein, ur: 30 mg/dL — AB
Specific Gravity, Urine: 1.025 (ref 1.005–1.030)
pH: 6 (ref 5.0–8.0)

## 2020-07-09 LAB — CMP (CANCER CENTER ONLY)
ALT: 40 U/L (ref 0–44)
AST: 49 U/L — ABNORMAL HIGH (ref 15–41)
Albumin: 2.2 g/dL — ABNORMAL LOW (ref 3.5–5.0)
Alkaline Phosphatase: 161 U/L — ABNORMAL HIGH (ref 38–126)
Anion gap: 9 (ref 5–15)
BUN: 49 mg/dL — ABNORMAL HIGH (ref 8–23)
CO2: 19 mmol/L — ABNORMAL LOW (ref 22–32)
Calcium: 8.4 mg/dL — ABNORMAL LOW (ref 8.9–10.3)
Chloride: 107 mmol/L (ref 98–111)
Creatinine: 3.03 mg/dL (ref 0.61–1.24)
GFR, Estimated: 21 mL/min — ABNORMAL LOW (ref >60)
Glucose, Bld: 137 mg/dL — ABNORMAL HIGH (ref 70–99)
Potassium: 4.4 mmol/L (ref 3.5–5.1)
Sodium: 135 mmol/L (ref 135–145)
Total Bilirubin: 2.8 mg/dL — ABNORMAL HIGH (ref 0.3–1.2)
Total Protein: 7.5 g/dL (ref 6.5–8.1)

## 2020-07-09 MED ORDER — SODIUM CHLORIDE 0.9 % IV SOLN
Freq: Once | INTRAVENOUS | Status: AC
  Filled 2020-07-09: qty 250

## 2020-07-09 MED ORDER — SODIUM CHLORIDE 0.9 % IV SOLN
240.0000 mg | Freq: Once | INTRAVENOUS | Status: AC
  Administered 2020-07-09: 240 mg via INTRAVENOUS
  Filled 2020-07-09: qty 24

## 2020-07-09 NOTE — Patient Instructions (Signed)
[facility] Discharge Instructions for Patients Receiving Chemotherapy  Today you received the following chemotherapy agent: nivolumab.  To help prevent nausea and vomiting after your treatment, we encourage you to take your nausea medication as directed.   If you develop nausea and vomiting that is not controlled by your nausea medication, call the clinic.   BELOW ARE SYMPTOMS THAT SHOULD BE REPORTED IMMEDIATELY:  *FEVER GREATER THAN 100.5 F  *CHILLS WITH OR WITHOUT FEVER  NAUSEA AND VOMITING THAT IS NOT CONTROLLED WITH YOUR NAUSEA MEDICATION  *UNUSUAL SHORTNESS OF BREATH  *UNUSUAL BRUISING OR BLEEDING  TENDERNESS IN MOUTH AND THROAT WITH OR WITHOUT PRESENCE OF ULCERS  *URINARY PROBLEMS  *BOWEL PROBLEMS  UNUSUAL RASH Items with * indicate a potential emergency and should be followed up as soon as possible.  Feel free to call the clinic should you have any questions or concerns. The clinic phone number is [phone].  Please show the CHEMO ALERT CARD at check-in to the Emergency Department and triage nurse.   

## 2020-07-09 NOTE — Progress Notes (Signed)
[facility] OFFICE PROGRESS NOTE   Diagnosis: Hepatocellular carcinoma  INTERVAL HISTORY:   [name] returns as scheduled.  He was last treated with nivolumab on 06/11/2020.  He developed increased malaise, somnolence, and altered taste over the next few weeks.  He was seen in the emergency room on 07/04/2020.  He received intravenous fluids and was treated with a dose of ceftriaxone after he was noted to have pyuria.  His symptoms were much improved after the emergency room visit.  He has noted increased abdominal distention over the past week.  He no longer has pain with urination, but continues to have urinary hesitancy.  Objective:  Vital signs in last 24 hours:  Blood pressure 123/77, pulse 70, temperature (!) 97 F (36.1 C), temperature source Tympanic, resp. rate 18, height 5\' 8"  (1.727 m), weight 213 lb 1.6 oz (96.7 kg), SpO2 100 %.    Resp: Lungs clear bilaterally Cardio: Irregular GI: Distended with ascites, no hepatomegaly Vascular: No leg edema    Portacath/PICC-without erythema  Lab Results:  Lab Results  Component Value Date   WBC 13.2 (H) 07/04/2020   HGB 11.8 (L) 07/04/2020   HCT 36.4 (L) 07/04/2020   MCV 109.0 (H) 07/04/2020   PLT 192 07/04/2020   NEUTROABS 11.3 (H) 07/04/2020    CMP  Lab Results  Component Value Date   NA 137 07/04/2020   K 4.5 07/04/2020   CL 106 07/04/2020   CO2 20 (L) 07/04/2020   GLUCOSE 165 (H) 07/04/2020   BUN 61 (H) 07/04/2020   CREATININE 3.17 (H) 07/04/2020   CALCIUM 8.6 (L) 07/04/2020   PROT 7.6 07/04/2020   ALBUMIN 2.4 (L) 07/04/2020   AST 43 (H) 07/04/2020   ALT 34 07/04/2020   ALKPHOS 134 (H) 07/04/2020   BILITOT 4.3 (H) 07/04/2020   GFRNONAA 20 (L) 07/04/2020   GFRAA 37 (L) 04/08/2020     Medications: I have reviewed the patient's current medications.   Assessment/Plan: 1. Hepatocellular carcinoma ? Multiple Li-Rad 5 liver lesions noted on a CT abdomen/pelvis 01/02/2020 including a 7.1  x 5.7 cm segment 2 lesion, there are right and left hepatic lesions ? CT abdomen/pelvis 01/02/2020-cirrhosis with varices, multiple LR 5 liver lesions, 1.1 cm fluid density lesion in the pancreas head, splenomegaly, bilateral kidney stones, small amount of right perihepatic ascites ? MRI liver 02/08/2020-cirrhosis, numerous hyperenhancing liver lesions, largest lesion in segment two measuring 7.9 x 6.1 cm, lesions are consistent with multifocal hepatocellular carcinoma, the three largest lesions are categorized as LI-RADS5. 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 Y90 left hepatic lobe 03/01/2020 ? Cycle 1 nivolumab 04/08/2020 ? Cycle 2 nivolumab 04/22/2020 ? Cycle 3 nivolumab 05/13/2020 ? Cycle 4 nivolumab 05/27/2020 ? Cycle 5 nivolumab 06/11/2020 ? MRI liver 07/04/2020-cirrhosis with portal venous hypertension and ascites.  Decrease in size and enhancement associated with treated left liver lesions, segment 6 lesion mildly increased in size, decreased size of segment 5 lesion ? Cycle 6 nivolumab 07/09/2020 2. 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 Hypertension 4. BPH 5. Hyperlipidemia 6. History of heavy alcohol use 7. Remote history of tobacco use 8. Renal insufficiency 9. Microscopic hematuria 10. 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 Esophageal mucosal changes consistent with long segment Barrett's esophagus present in the distal esophagus. Severe  portal hypertensive gastropathy found in the entire examined stomach. Small hiatal hernia. 11. Mild right hydronephrosis with a probable calculus in the proximal third of the right ureter on the MRI 03/06/2020, renal ultrasound 04/01/2020-multiple nonobstructing bilateral renal  calculi  05/22/2020-laser lithotripsy, bilateral ureter stent placement 12.  Atrial fibrillation-not on anticoagulation therapy 13.  Dizziness-improved with discontinuation of Flomax.  Not orthostatic 06/11/2020     Disposition: [name] has an improved performance status compared to when he was here several weeks ago.  The restaging MRI reveals significant improvement in the left hepatic tumor burden with a mixed response in the right liver.  I will present his case at the GI tumor conference within the next 2 weeks to decide on continuing nivolumab and Y 90 treatment to the right liver.  His performance status appears improved today.  We will repeat a urinalysis and urine culture.  He is scheduled to see GI later this week to discuss management of the ascites.  The ascites is likely worse after receiving IV fluids in the emergency room last week.  Mr Dillon Perkins has chronic renal insufficiency.  I doubt the elevated creatinine is related to toxicity from nivolumab.  He is scheduled to see gastroenterology later this week.  He will also see urology to discuss management of urinary symptoms.  [name] will return for an office visit in nivolumab in 2 weeks. Betsy Coder, MD  07/09/2020  8:19 AM

## 2020-07-09 NOTE — Telephone Encounter (Signed)
CRITICAL VALUE STICKER  CRITICAL VALUE: Creatinine = 3.03  RECEIVER (on-site recipient of call): Yetta Glassman, CMA  DATE & TIME NOTIFIED: 07/09/2020 at 8:47am  MESSENGER (representative from lab): Rosann Auerbach  MD NOTIFIED: Dr. Benay Spice  TIME OF NOTIFICATION: 07/09/2020 at 8:50am  RESPONSE: Notification given to Manuela Schwartz, RN for follow-up with provider.

## 2020-07-09 NOTE — Progress Notes (Signed)
Per Dr. Benay Spice: OK to treat w/creatinine 3.03 and total bili 2.8

## 2020-07-10 ENCOUNTER — Telehealth: Payer: Self-pay | Admitting: Oncology

## 2020-07-10 ENCOUNTER — Telehealth: Payer: Self-pay | Admitting: Gastroenterology

## 2020-07-10 ENCOUNTER — Telehealth: Payer: Self-pay | Admitting: *Deleted

## 2020-07-10 DIAGNOSIS — C220 Liver cell carcinoma: Secondary | ICD-10-CM

## 2020-07-10 NOTE — Telephone Encounter (Signed)
Patient is requesting a paracentesis during office visit tomorrow.  I explained that he will need to be seen in the office and assessed.  If Dillon Perkins RNP feels is the next step she will arrange, but patient notified can't be done here in the office

## 2020-07-10 NOTE — Telephone Encounter (Signed)
Inbound call from patient requesting a call back please in regards to his appointment tomorrow.  Did not specify further.

## 2020-07-10 NOTE — Telephone Encounter (Signed)
Reports his abdominal fluid is very uncomfortable and wants a paracentesis. OK per Dr. Benay Spice to order with a 3 liter limit. GI can cancel after appointment tomorrow if they do not feel it is necessary. Scheduled for 07/12/20 at 2:15/2:30 and patient notified via VM.

## 2020-07-10 NOTE — Telephone Encounter (Signed)
Scheduled appointments per 1/4 los. Called patient, no answer. Left message with appointments dates and times.

## 2020-07-11 ENCOUNTER — Ambulatory Visit (INDEPENDENT_AMBULATORY_CARE_PROVIDER_SITE_OTHER): Payer: Medicare Other | Admitting: Nurse Practitioner

## 2020-07-11 ENCOUNTER — Telehealth: Payer: Self-pay

## 2020-07-11 ENCOUNTER — Encounter: Payer: Self-pay | Admitting: Nurse Practitioner

## 2020-07-11 ENCOUNTER — Other Ambulatory Visit: Payer: Self-pay | Admitting: Nurse Practitioner

## 2020-07-11 VITALS — BP 122/70 | HR 80 | Ht 68.0 in | Wt 211.0 lb

## 2020-07-11 DIAGNOSIS — N401 Enlarged prostate with lower urinary tract symptoms: Secondary | ICD-10-CM | POA: Diagnosis not present

## 2020-07-11 DIAGNOSIS — R188 Other ascites: Secondary | ICD-10-CM

## 2020-07-11 DIAGNOSIS — R8279 Other abnormal findings on microbiological examination of urine: Secondary | ICD-10-CM | POA: Diagnosis not present

## 2020-07-11 DIAGNOSIS — C220 Liver cell carcinoma: Secondary | ICD-10-CM

## 2020-07-11 DIAGNOSIS — K7460 Unspecified cirrhosis of liver: Secondary | ICD-10-CM | POA: Diagnosis not present

## 2020-07-11 DIAGNOSIS — K7031 Alcoholic cirrhosis of liver with ascites: Secondary | ICD-10-CM

## 2020-07-11 DIAGNOSIS — R3915 Urgency of urination: Secondary | ICD-10-CM | POA: Diagnosis not present

## 2020-07-11 LAB — URINE CULTURE: Culture: 10000 — AB

## 2020-07-11 MED ORDER — AMOXICILLIN 500 MG PO CAPS: 500.0000 mg | ORAL_CAPSULE | Freq: Two times a day (BID) | ORAL | 0 refills | Status: AC

## 2020-07-11 NOTE — Progress Notes (Addendum)
Addendum 08/28/20:  Received follow up note from [facility] dated 08/23/2020.  Patient has CKD stage IV.  Also with recent history of AKI after contrast, NSAIDs and nivolumab.  Goals of care discussed in relationship to starting hemodialysis,  Lasix 40 mg Monday, Wednesday Friday, low-salt diet, Nephrology agrees with IV albumin with LVP but should avoid removal of > 4 liters, follow up two months.     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 He is s/p Y90 to left liver. He has just completed cycle 6 of nivolumab. Restaging MRI reveals significant improvement in left hepatic tumor burden with mixed response in right liver. He has a hx of esophageal varices and now with new development of ascites on imaging.  --Oncology has arranged for a 3 Liter LVP to be done tomorrow.  Given recent AKI on CKD I am adding 50 grams of IV albumin to be given at time of LVP    --Additionally, will order ascitic fluid cell count and culture  --Recent INR 1.5.  --Worsening renal function over the past several days. Seen in ED 12/30 with weakness .Had AKI on CKD.  Creatinine had increased from baseline of around 2.6 to 3.17. Got IVF and a dose of antibiotics for pyuria. .  Follow up creatinine on 1/4 showed minimal improvement . At this point I am hesitant to add or increase diuretics. He can remain on 25 mg of Aldactone daily which has helped his peripheral edema. Sodium restriction will be important for him. We discussed  2 gram sodium diet. At some point we may need to involve Nephrology.  --Repeat BMET in a week.  --He is up to date on varices screening.  EGD September 2021 with findings of Grade II esophageal varices and severe portal gastropathy --He has a follow up scheduled with Dr. Fuller Plan the end of this month. In the interim I will let him know about labs results   # Long segment Barrett's esophagus on EGD September 2021  HISTORY OF PRESENT ILLNESS      Primary Gastroenterologist :  Lucio Edward, MD  Chief Complaint : cirrhosis, new ascites.   Dillon Perkins is a 70 y.o. male with PMH / [place] significant for,  but not necessarily limited to: Cirrhosis with HCC,  CKD, atrial fibrillation on Eliquis, CVA  Patient followed by Dr. Fuller Plan for cirrhosis, last seen 04/02/20.  Cirrhosis felt to be Etoh related. He has a history of multifocal HCC, s/p Y90 to just the left sided liver lesions. He is on cycle 6 of Nivolumab . Recent MRI shows improvement in left liver lesions, mixed results of right liver lesions, unchanged cystic lesions of pancreas, cholelithiasis, varices and new ascites.Over the past week patient has noted increased abdominal distention. He was taking Aldactone 25 mg for peripheral edema but two days ago increased dose to 50 mg daily to see if it would help ascites. He isn't really following a low salt diet.  Oncology has scheduled patient for a 3 liter LVP tomorrow.    Patient seen in ED on 12/30 with weakness. He had AKI on CKD. UA suspicious for UTI. He was given IVF and IV antibiotics.   Previous Endoscopic Evaluations / Pertinent Studies EGD 03/19/2020 - Grade II esophageal varices. - Esophageal mucosal changes consistent with long-segment Barrett's esophagus. - Small hiatal hernia. - Portal hypertensive gastropathy. - Normal duodenal bulb and second portion of the  duodenum. - No specimens collected.   Past Medical History:  Diagnosis Date  . Alcoholic cirrhosis ([place])   . Atrial fibrillation ([place])   . GERD (gastroesophageal reflux disease)   . Hepatocellular carcinoma ([place])   . History of chemotherapy taking immunotherapy   last dose 05-13-2020  . History of kidney stones   . Hyperlipidemia   . Hypertension     Current Medications, Allergies, Past Surgical History, Family History and Social History were reviewed in [employer] record.   Current Outpatient Medications  Medication Sig  Dispense Refill  . Coenzyme Q10 (COQ10) 100 MG CAPS Take 100 mg by mouth daily.     Marland Kitchen LORazepam (ATIVAN) 0.5 MG tablet Take 0.5 tablets (0.25 mg total) by mouth at bedtime as needed for anxiety or sleep. 30 tablet 0  . pantoprazole (PROTONIX) 40 MG tablet Take 1 tablet (40 mg total) by mouth daily. 90 tablet 3  . pravastatin (PRAVACHOL) 40 MG tablet Take 40 mg by mouth at bedtime.     Marland Kitchen spironolactone (ALDACTONE) 50 MG tablet Take 0.5 tablets (25 mg total) by mouth daily. (Patient taking differently: Take 50 mg by mouth daily.) 30 tablet 3  . cephALEXin (KEFLEX) 500 MG capsule Take 2 capsules (1,000 mg total) by mouth 2 (two) times daily. (Patient not taking: No sig reported) 28 capsule 0   No current facility-administered medications for this visit.    Review of Systems: No chest pain. No shortness of breath. He complains of frequent urination.    PHYSICAL EXAM :    Wt Readings from Last 3 Encounters:  07/11/20 211 lb (95.7 kg)  07/09/20 213 lb 1.6 oz (96.7 kg)  07/04/20 202 lb (91.6 kg)    BP 122/70   Pulse 80   Ht 5\' 8"  (1.727 m)   Wt 211 lb (95.7 kg)   BMI 32.08 kg/m  Constitutional:  Pleasant male in no acute distress. Psychiatric: Normal mood and affect. Behavior is normal. Neck supple.  Cardiovascular: Normal rate, regular rhythm. No edema Pulmonary/chest: Effort normal and breath sounds normal. No wheezing, rales or rhonchi. Abdominal: Soft, nondistended, nontender. Bowel sounds active throughout.  Neurological: Alert and oriented to person place and time. Skin: Skin is warm and dry. No rashes noted.  [name], NP  07/11/2020, 11:14 AM

## 2020-07-11 NOTE — Telephone Encounter (Signed)
Notified wife of antibiotic change due to liver and renal function.

## 2020-07-11 NOTE — Patient Instructions (Addendum)
If you are age 72 or older, your body mass index should be between 23-30. Your Body mass index is 32.08 kg/m. If this is out of the aforementioned range listed, please consider follow up with your Primary Care Provider.  If you are age 72 or younger, your body mass index should be between 19-25. Your Body mass index is 32.08 kg/m. If this is out of the aformentioned range listed, please consider follow up with your Primary Care Provider.   Your provider has requested that you go to the basement level for lab work in 1 week. Press "B" on the elevator. The lab is located at the first door on the left as you exit the elevator.  Follow a 2 gram sodium diet.  Keep your follow up with Dr. Fuller Plan on August 01, 2020.  Your Paracentesis has been scheduled for July 15, 2020 at [facility] at 10:00 am  Thank you for entrusting me with your care and choosing [facility].  [name], NP-C

## 2020-07-11 NOTE — Telephone Encounter (Signed)
Called wife with positive urine culture. Per Dr. Benay Spice, Patient is to take Bactrim DS which will be called in to their pharmacy. Instructed to increase water intake.

## 2020-07-12 ENCOUNTER — Encounter (HOSPITAL_COMMUNITY): Payer: Self-pay

## 2020-07-12 ENCOUNTER — Ambulatory Visit (HOSPITAL_COMMUNITY): Payer: Medicare Other

## 2020-07-12 NOTE — Progress Notes (Signed)
Reviewed and agree with management plan. Assessment and management plans discussed with NP Chester Holstein.   Alcoholic cirrhosis with multifocal HCC.  New ascites likely d/t portal hypertension from cirrhosis. 3L paracentesis planned and will provide IV albumin given renal insufficiency.  Check cell counts, culture, ascites albumin and serum albumin. Renal insufficiency will complicate diuretic mgmt. Proceed with renal consult.   Pricilla Riffle. Fuller Plan, MD [facility]

## 2020-07-15 ENCOUNTER — Other Ambulatory Visit: Payer: Self-pay

## 2020-07-15 ENCOUNTER — Ambulatory Visit (HOSPITAL_COMMUNITY)
Admission: RE | Admit: 2020-07-15 | Discharge: 2020-07-15 | Disposition: A | Discharge status: Home / Self Care | Payer: Medicare Other | Source: Ambulatory Visit | Attending: Nurse Practitioner | Admitting: Nurse Practitioner

## 2020-07-15 DIAGNOSIS — K7460 Unspecified cirrhosis of liver: Secondary | ICD-10-CM | POA: Insufficient documentation

## 2020-07-15 DIAGNOSIS — R188 Other ascites: Secondary | ICD-10-CM | POA: Insufficient documentation

## 2020-07-15 HISTORY — PX: IR PARACENTESIS: IMG2679

## 2020-07-15 LAB — BODY FLUID CELL COUNT WITH DIFFERENTIAL
Eos, Fluid: 0 %
Lymphs, Fluid: 22 %
Monocyte-Macrophage-Serous Fluid: 57 % (ref 50–90)
Neutrophil Count, Fluid: 21 % (ref 0–25)
Total Nucleated Cell Count, Fluid: 165 cu mm (ref 0–1000)

## 2020-07-15 LAB — GRAM STAIN

## 2020-07-15 MED ORDER — ALBUMIN HUMAN 25 % IV SOLN
INTRAVENOUS | Status: AC
  Filled 2020-07-15: qty 200

## 2020-07-15 MED ORDER — LIDOCAINE HCL 1 % IJ SOLN
INTRAMUSCULAR | Status: DC | PRN
  Administered 2020-07-15: 10 mL

## 2020-07-15 MED ORDER — LIDOCAINE HCL 1 % IJ SOLN
INTRAMUSCULAR | Status: AC
  Filled 2020-07-15: qty 20

## 2020-07-15 MED ORDER — ALBUMIN HUMAN 25 % IV SOLN
50.0000 g | Freq: Once | INTRAVENOUS | Status: AC
  Administered 2020-07-15: 50 g via INTRAVENOUS
  Filled 2020-07-15: qty 200

## 2020-07-15 NOTE — Procedures (Signed)
PROCEDURE SUMMARY:  Successful image-guided paracentesis from the right lateral abdomen.  Yielded 3.0 liters of hazy gold fluid.  No immediate complications.  EBL = 0 mL. Patient tolerated well.   Specimen was sent for labs.  Please see imaging section of Epic for full dictation.   Claris Pong Clois Treanor PA-C 07/15/2020 10:39 AM

## 2020-07-16 LAB — PATHOLOGIST SMEAR REVIEW

## 2020-07-17 ENCOUNTER — Other Ambulatory Visit: Payer: Self-pay

## 2020-07-17 ENCOUNTER — Telehealth: Payer: Self-pay

## 2020-07-17 NOTE — Telephone Encounter (Signed)
Received call from patient's wife [name] who is requesting something be called in for itching.  He is itching especially in the chest area and it is keeping him up at night.

## 2020-07-17 NOTE — Progress Notes (Signed)
The proposed treatment discussed in conference is for discussion purposes only and is not a binding recommendation.  The patients have not been physically examined, or presented with their treatment options.  Therefore, final treatment plans cannot be decided.   

## 2020-07-17 NOTE — Telephone Encounter (Signed)
Per Dr. Benay Spice advised patient's wife for him to try OTC Benadryl 25 mg tablet at bedtime.  She verbalized an understanding.

## 2020-07-18 ENCOUNTER — Other Ambulatory Visit: Payer: Self-pay

## 2020-07-18 ENCOUNTER — Other Ambulatory Visit (INDEPENDENT_AMBULATORY_CARE_PROVIDER_SITE_OTHER): Payer: Medicare Other

## 2020-07-18 DIAGNOSIS — R188 Other ascites: Secondary | ICD-10-CM

## 2020-07-18 DIAGNOSIS — K7460 Unspecified cirrhosis of liver: Secondary | ICD-10-CM | POA: Diagnosis not present

## 2020-07-18 LAB — PROTIME-INR
INR: 1.4 ratio — ABNORMAL HIGH (ref 0.8–1.0)
Prothrombin Time: 15.6 s — ABNORMAL HIGH (ref 9.6–13.1)

## 2020-07-18 LAB — BASIC METABOLIC PANEL
BUN: 50 mg/dL — ABNORMAL HIGH (ref 6–23)
CO2: 20 mEq/L (ref 19–32)
Calcium: 8.5 mg/dL (ref 8.4–10.5)
Chloride: 108 mEq/L (ref 96–112)
Creatinine, Ser: 3.08 mg/dL — ABNORMAL HIGH (ref 0.40–1.50)
GFR: 19.67 mL/min — ABNORMAL LOW (ref >60.00)
Glucose, Bld: 142 mg/dL — ABNORMAL HIGH (ref 70–99)
Potassium: 4 mEq/L (ref 3.5–5.1)
Sodium: 136 mEq/L (ref 135–145)

## 2020-07-19 ENCOUNTER — Telehealth: Payer: Self-pay | Admitting: Nurse Practitioner

## 2020-07-19 NOTE — Telephone Encounter (Signed)
    Patient calling for lab results 

## 2020-07-20 LAB — CULTURE, BODY FLUID W GRAM STAIN -BOTTLE: Culture: NO GROWTH

## 2020-07-21 ENCOUNTER — Other Ambulatory Visit: Payer: Self-pay | Admitting: Oncology

## 2020-07-23 ENCOUNTER — Telehealth: Payer: Self-pay

## 2020-07-23 ENCOUNTER — Encounter: Payer: Self-pay | Admitting: *Deleted

## 2020-07-23 ENCOUNTER — Encounter: Payer: Self-pay | Admitting: Nurse Practitioner

## 2020-07-23 ENCOUNTER — Inpatient Hospital Stay (HOSPITAL_BASED_OUTPATIENT_CLINIC_OR_DEPARTMENT_OTHER): Payer: Medicare Other | Admitting: Nurse Practitioner

## 2020-07-23 ENCOUNTER — Other Ambulatory Visit: Payer: Self-pay

## 2020-07-23 ENCOUNTER — Inpatient Hospital Stay: Payer: Medicare Other

## 2020-07-23 ENCOUNTER — Ambulatory Visit: Payer: Medicare Other | Admitting: Gastroenterology

## 2020-07-23 VITALS — BP 121/75 | HR 72 | Temp 97.5°F | Resp 16 | Wt 208.0 lb

## 2020-07-23 DIAGNOSIS — Z5189 Encounter for other specified aftercare: Secondary | ICD-10-CM | POA: Diagnosis not present

## 2020-07-23 DIAGNOSIS — R3911 Hesitancy of micturition: Secondary | ICD-10-CM | POA: Diagnosis not present

## 2020-07-23 DIAGNOSIS — Z79899 Other long term (current) drug therapy: Secondary | ICD-10-CM | POA: Diagnosis not present

## 2020-07-23 DIAGNOSIS — Z5112 Encounter for antineoplastic immunotherapy: Secondary | ICD-10-CM | POA: Diagnosis not present

## 2020-07-23 DIAGNOSIS — C220 Liver cell carcinoma: Secondary | ICD-10-CM

## 2020-07-23 DIAGNOSIS — R188 Other ascites: Secondary | ICD-10-CM

## 2020-07-23 DIAGNOSIS — R300 Dysuria: Secondary | ICD-10-CM | POA: Diagnosis not present

## 2020-07-23 LAB — CMP (CANCER CENTER ONLY)
ALT: 29 U/L (ref 0–44)
AST: 41 U/L (ref 15–41)
Albumin: 2.6 g/dL — ABNORMAL LOW (ref 3.5–5.0)
Alkaline Phosphatase: 108 U/L (ref 38–126)
Anion gap: 11 (ref 5–15)
BUN: 58 mg/dL — ABNORMAL HIGH (ref 8–23)
CO2: 16 mmol/L — ABNORMAL LOW (ref 22–32)
Calcium: 8.8 mg/dL — ABNORMAL LOW (ref 8.9–10.3)
Chloride: 109 mmol/L (ref 98–111)
Creatinine: 3.11 mg/dL (ref 0.61–1.24)
GFR, Estimated: 21 mL/min — ABNORMAL LOW (ref >60)
Glucose, Bld: 124 mg/dL — ABNORMAL HIGH (ref 70–99)
Potassium: 4.4 mmol/L (ref 3.5–5.1)
Sodium: 136 mmol/L (ref 135–145)
Total Bilirubin: 2.6 mg/dL — ABNORMAL HIGH (ref 0.3–1.2)
Total Protein: 7.2 g/dL (ref 6.5–8.1)

## 2020-07-23 MED ORDER — SODIUM CHLORIDE 0.9 % IV SOLN
240.0000 mg | Freq: Once | INTRAVENOUS | Status: AC
  Administered 2020-07-23: 240 mg via INTRAVENOUS
  Filled 2020-07-23: qty 24

## 2020-07-23 MED ORDER — SODIUM CHLORIDE 0.9 % IV SOLN
Freq: Once | INTRAVENOUS | Status: AC
  Filled 2020-07-23: qty 250

## 2020-07-23 MED ORDER — LORAZEPAM 0.5 MG PO TABS: 0.2500 mg | ORAL_TABLET | Freq: Every evening | ORAL | 0 refills | Status: DC | PRN

## 2020-07-23 NOTE — Telephone Encounter (Signed)
All office visit notes and labs faxed to [employer]. Left a message on the voicemail of the referral coordinator about the patient.

## 2020-07-23 NOTE — Progress Notes (Signed)
Per Ned Card NP, ok to treat with elevated creatinine 3.11.

## 2020-07-23 NOTE — Addendum Note (Signed)
Addended by: Owens Shark on: 07/23/2020 11:54 AM   Modules accepted: Orders

## 2020-07-23 NOTE — Telephone Encounter (Signed)
Results have now been reviewed by the provider. Message sent through the My Chart portal.  Called the patient. No answer. Left a message that the results have been reviewed and there is a message attached to the results. He will be referred to a kidney specialist. Message left at [facility] referral line.

## 2020-07-23 NOTE — Patient Instructions (Signed)
Cancer Center Discharge Instructions for Patients Receiving Chemotherapy  Today you received the following chemotherapy agents: nivolumab.  To help prevent nausea and vomiting after your treatment, we encourage you to take your nausea medication as directed.   If you develop nausea and vomiting that is not controlled by your nausea medication, call the clinic.   BELOW ARE SYMPTOMS THAT SHOULD BE REPORTED IMMEDIATELY:  *FEVER GREATER THAN 100.5 F  *CHILLS WITH OR WITHOUT FEVER  NAUSEA AND VOMITING THAT IS NOT CONTROLLED WITH YOUR NAUSEA MEDICATION  *UNUSUAL SHORTNESS OF BREATH  *UNUSUAL BRUISING OR BLEEDING  TENDERNESS IN MOUTH AND THROAT WITH OR WITHOUT PRESENCE OF ULCERS  *URINARY PROBLEMS  *BOWEL PROBLEMS  UNUSUAL RASH Items with * indicate a potential emergency and should be followed up as soon as possible.  Feel free to call the clinic should you have any questions or concerns. The clinic phone number is [phone].  Please show the CHEMO ALERT CARD at check-in to the Emergency Department and triage nurse.   

## 2020-07-23 NOTE — Progress Notes (Signed)
[place] OFFICE PROGRESS NOTE   Diagnosis: Hepatocellular carcinoma  INTERVAL HISTORY:   Dillon Perkins returns as scheduled.  He completed a cycle of nivolumab 07/09/2020.  No rash or diarrhea.  He noted significant improvement following the paracentesis 07/13/2020.  He does feel the fluid is now reaccumulating.  Main complaint is alteration in taste.  Objective:  Vital signs in last 24 hours:  Temperature 97.5, heart rate 72, respiration 16, blood pressure 121/75, weight 208 pounds    HEENT: Very minimal white coating over time. Resp: Lungs clear bilaterally. Cardio: Irregular. GI: Abdomen distended consistent with ascites. Vascular: No leg edema.  Skin: No rash.   Lab Results:  Lab Results  Component Value Date   WBC 13.2 (H) 07/04/2020   HGB 11.8 (L) 07/04/2020   HCT 36.4 (L) 07/04/2020   MCV 109.0 (H) 07/04/2020   PLT 192 07/04/2020   NEUTROABS 11.3 (H) 07/04/2020    Imaging:  No results found.  Medications: I have reviewed the patient's current medications.  Assessment/Plan: 1. Hepatocellular carcinoma ? Multiple Li-Rad 5 liver lesions noted on a CT abdomen/pelvis 01/02/2020 including a 7.1 x 5.7 cm segment 2 lesion, there are right and left hepatic lesions ? CT abdomen/pelvis 01/02/2020-cirrhosis with varices, multiple LR 5 liver lesions, 1.1 cm fluid density lesion in the pancreas head, splenomegaly, bilateral kidney stones, small amount of right perihepatic ascites ? MRI liver 02/08/2020-cirrhosis, numerous hyperenhancing liver lesions, largest lesion in segment two measuring 7.9 x 6.1 cm, lesions are consistent with multifocal hepatocellular carcinoma, the three largest lesions are categorized as LI-RADS5. 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 Y90 left hepatic lobe 03/01/2020 ? Cycle 1 nivolumab 04/08/2020 ? Cycle 2 nivolumab 04/22/2020 ? Cycle 3  nivolumab 05/13/2020 ? Cycle 4 nivolumab 05/27/2020 ? Cycle 5 nivolumab 06/11/2020 ? MRI liver 07/04/2020-cirrhosis with portal venous hypertension and ascites.  Decrease in size and enhancement associated with treated left liver lesions, segment 6 lesion mildly increased in size, decreased size of segment 5 lesion ? Cycle 6 nivolumab 07/09/2020 ? Tumor board 07/17/2020- not a candidate for further Y90, continue nivolumab ? Cycle 7 nivolumab 07/23/2020 2. 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 Hypertension 4. BPH 5. Hyperlipidemia 6. History of heavy alcohol use 7. Remote history of tobacco use 8. Renal insufficiency 9. Microscopic hematuria 10. 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 Esophageal mucosal changes consistent with long segment Barrett's esophagus present in the distal esophagus. Severe portal hypertensive gastropathy found in the entire examined stomach. Small hiatal hernia. 11. Mild right hydronephrosis with a probable calculus in the proximal third of the right ureter on the MRI 03/06/2020, renal ultrasound 04/01/2020-multiple nonobstructing bilateral renal calculi  05/22/2020-laser lithotripsy, bilateral ureter stent placement 12. Atrial fibrillation-not on anticoagulation therapy 13.  Dizziness-improved with discontinuation of Flomax.  Not orthostatic 06/11/2020    Disposition: Dillon Perkins appears unchanged.  He has completed 6 cycles of nivolumab.  Plan to proceed with cycle 7 today as scheduled pending chemistry panel results.  He will continue follow-up with GI for refractory ascites.  He reports he has been referred to nephrology.  He will return for lab, follow-up, nivolumab in 2 weeks.  We are available to see him sooner if needed.    Ned Card ANP/GNP-BC   07/23/2020  11:09  AM        

## 2020-07-23 NOTE — Progress Notes (Unsigned)
Critical creatinine of 3.11 given to Dillon Perkins at 1129 am by Dorian Furnace (MT)

## 2020-07-24 ENCOUNTER — Telehealth: Payer: Self-pay | Admitting: Nurse Practitioner

## 2020-07-24 NOTE — Telephone Encounter (Signed)
Pt called requesting to speak with Dillon Perkins. He said hat it was related to an evaluation for his kidneys. Pt did not want to disclose more information.

## 2020-07-24 NOTE — Telephone Encounter (Signed)
Scheduled appointments per 11/8 los. Spoke to patient who is aware of appointments dates and times.  

## 2020-07-24 NOTE — Telephone Encounter (Signed)
Spoke with the patient. He understands the need for the Nephrologist. Reports 5 lbs gain since the weekend. Breathing is more difficult than right after the paracentesis. Mindful of a low sodium diet. States no appetite. He has not heard from [place] yet.

## 2020-07-25 DIAGNOSIS — I10 Essential (primary) hypertension: Secondary | ICD-10-CM | POA: Diagnosis not present

## 2020-07-25 DIAGNOSIS — I4891 Unspecified atrial fibrillation: Secondary | ICD-10-CM | POA: Diagnosis not present

## 2020-07-25 DIAGNOSIS — C220 Liver cell carcinoma: Secondary | ICD-10-CM | POA: Diagnosis not present

## 2020-07-25 DIAGNOSIS — N1832 Chronic kidney disease, stage 3b: Secondary | ICD-10-CM | POA: Diagnosis not present

## 2020-07-25 DIAGNOSIS — E7800 Pure hypercholesterolemia, unspecified: Secondary | ICD-10-CM | POA: Diagnosis not present

## 2020-07-25 DIAGNOSIS — R188 Other ascites: Secondary | ICD-10-CM | POA: Diagnosis not present

## 2020-07-25 NOTE — Telephone Encounter (Signed)
Spoke with Dawn at [employer] [facility]. The provider has the patient's records. Hoping to have him rated soon. She will let the provider now the patient has had weight gain of about 5 pounds this week.

## 2020-07-29 DIAGNOSIS — K7031 Alcoholic cirrhosis of liver with ascites: Secondary | ICD-10-CM | POA: Diagnosis not present

## 2020-07-29 DIAGNOSIS — N184 Chronic kidney disease, stage 4 (severe): Secondary | ICD-10-CM | POA: Diagnosis not present

## 2020-07-29 DIAGNOSIS — N179 Acute kidney failure, unspecified: Secondary | ICD-10-CM | POA: Diagnosis not present

## 2020-07-29 DIAGNOSIS — R82998 Other abnormal findings in urine: Secondary | ICD-10-CM | POA: Diagnosis not present

## 2020-07-29 DIAGNOSIS — I129 Hypertensive chronic kidney disease with stage 1 through stage 4 chronic kidney disease, or unspecified chronic kidney disease: Secondary | ICD-10-CM | POA: Diagnosis not present

## 2020-07-29 DIAGNOSIS — N200 Calculus of kidney: Secondary | ICD-10-CM | POA: Diagnosis not present

## 2020-07-29 DIAGNOSIS — N400 Enlarged prostate without lower urinary tract symptoms: Secondary | ICD-10-CM | POA: Diagnosis not present

## 2020-07-30 NOTE — Telephone Encounter (Signed)
Patient had his first appointment with [facility] 07/29/20. We will be faxed the office note as soon as it is completed. He is scheduled to return on 08/23/20.

## 2020-08-01 ENCOUNTER — Other Ambulatory Visit (HOSPITAL_COMMUNITY): Payer: Self-pay | Admitting: Gastroenterology

## 2020-08-01 ENCOUNTER — Ambulatory Visit (INDEPENDENT_AMBULATORY_CARE_PROVIDER_SITE_OTHER): Payer: Medicare Other | Admitting: Gastroenterology

## 2020-08-01 ENCOUNTER — Encounter: Payer: Self-pay | Admitting: Gastroenterology

## 2020-08-01 VITALS — BP 130/70 | HR 78 | Ht 68.0 in | Wt 208.0 lb

## 2020-08-01 DIAGNOSIS — I8500 Esophageal varices without bleeding: Secondary | ICD-10-CM

## 2020-08-01 DIAGNOSIS — K7031 Alcoholic cirrhosis of liver with ascites: Secondary | ICD-10-CM | POA: Diagnosis not present

## 2020-08-01 DIAGNOSIS — I8510 Secondary esophageal varices without bleeding: Secondary | ICD-10-CM | POA: Diagnosis not present

## 2020-08-01 NOTE — Progress Notes (Signed)
    History of Present Illness: This is a 70 year old male with cirrhosis, worsening ascites and HCC. He is accompanied by his wife. He reports having a gradual weight gain since his last paracentesis. His weight is up by 8 pounds. His abdomen has become more distended and tight with some shortness of breath and edema in both lower extremities. He was evaluated at [facility] by Dr. Harrie Jeans on January 24 and I have the patient summary brought by the patient today but not the office note. She recommended beginning furosemide 40 mg 1 tablet 3 times a week and continuing spironolactone 25 mg daily. His appetite has improved slightly he states following the nivolumab infusions every other week he experiences appetite loss, decreased taste for several days.   Current Medications, Allergies, Past Medical History, Past Surgical History, Family History and Social History were reviewed in [employer] record.   Physical Exam: General: Well developed, well nourished, no acute distress Head: Normocephalic and atraumatic Eyes:  sclerae anicteric, EOMI Ears: Normal auditory acuity Mouth: Not examined, mask on during Covid-19 pandemic Lungs: Clear throughout to auscultation Heart: Regular rate and rhythm; no murmurs, rubs or bruits Abdomen: Distended, tight. Non tender. No masses, hepatosplenomegaly or hernias noted. Normal Bowel sounds Rectal: Not done Musculoskeletal: Symmetrical with no gross deformities  Pulses:  Normal pulses noted Extremities: No clubbing, cyanosis, or deformities noted. 1+ pretibial edema Neurological: Alert oriented x 4, grossly nonfocal Psychological:  Alert and cooperative. Normal mood and affect   Assessment and Recommendations:  1. Decompensated alcoholic cirrhosis with multifocal HCC, ascites, esophageal varices. S/P Y90 to left lobe. Currently receiving nivolumab every other week. Recurrent symptomatic ascites in setting of worsening  renal function.  Cr=3.11 and BUN=58 on Jan 18. His nephrologist recommends Lasix 40 mg po 3 times a week and Aldactone 25 mg qd. Defer diuretic mgmt to nephrology. Schedule US guided paracentesis with max of 3L removed and with IV albumin. Continue 2g Na diet. Continue daily weights. REV in 1 month with me or [name], NP.   2. AKI on CKD. See #1.   3. Grade II esophageal varices and severe portal gastropathy.  4. Long segment Barrett's esophagus found in September 2021.  Biopsies were not obtained as the Barrett's was overlying his esophageal varices. Continue pantoprazole 40 mg po qd.

## 2020-08-01 NOTE — Patient Instructions (Addendum)
Your paracentesis is scheduled at [facility] on 08/02/20 at 11:30am, arriving at 11:15am.   Thank you for choosing me and [facility].  Pricilla Riffle. Dagoberto Ligas., MD., Marval Regal

## 2020-08-02 ENCOUNTER — Ambulatory Visit (HOSPITAL_COMMUNITY)
Admission: RE | Admit: 2020-08-02 | Discharge: 2020-08-02 | Disposition: A | Discharge status: Home / Self Care | Payer: Medicare Other | Source: Ambulatory Visit | Attending: Nurse Practitioner | Admitting: Nurse Practitioner

## 2020-08-02 ENCOUNTER — Other Ambulatory Visit: Payer: Self-pay

## 2020-08-02 DIAGNOSIS — I8500 Esophageal varices without bleeding: Secondary | ICD-10-CM | POA: Insufficient documentation

## 2020-08-02 DIAGNOSIS — K7031 Alcoholic cirrhosis of liver with ascites: Secondary | ICD-10-CM | POA: Diagnosis not present

## 2020-08-02 HISTORY — PX: IR PARACENTESIS: IMG2679

## 2020-08-02 LAB — ALBUMIN, PLEURAL OR PERITONEAL FLUID: Albumin, Fluid: <1 g/dL

## 2020-08-02 LAB — GRAM STAIN

## 2020-08-02 LAB — AMYLASE, PLEURAL OR PERITONEAL FLUID: Amylase, Fluid: 24 U/L

## 2020-08-02 LAB — BODY FLUID CELL COUNT WITH DIFFERENTIAL
Eos, Fluid: 1 %
Lymphs, Fluid: 27 %
Monocyte-Macrophage-Serous Fluid: 55 % (ref 50–90)
Neutrophil Count, Fluid: 17 % (ref 0–25)
Total Nucleated Cell Count, Fluid: 223 cu mm (ref 0–1000)

## 2020-08-02 LAB — PROTEIN, PLEURAL OR PERITONEAL FLUID: Total protein, fluid: <3 g/dL

## 2020-08-02 MED ORDER — ALBUMIN HUMAN 25 % IV SOLN
INTRAVENOUS | Status: AC
  Filled 2020-08-02: qty 100

## 2020-08-02 MED ORDER — LIDOCAINE HCL 1 % IJ SOLN
INTRAMUSCULAR | Status: DC | PRN
  Administered 2020-08-02: 10 mL

## 2020-08-02 MED ORDER — ALBUMIN HUMAN 25 % IV SOLN
25.0000 g | Freq: Once | INTRAVENOUS | Status: AC
  Administered 2020-08-02: 25 g via INTRAVENOUS

## 2020-08-02 MED ORDER — LIDOCAINE HCL 1 % IJ SOLN
INTRAMUSCULAR | Status: AC
  Filled 2020-08-02: qty 20

## 2020-08-02 NOTE — Procedures (Signed)
PROCEDURE SUMMARY:  Successful image-guided paracentesis from the right lateral abdomen.  Yielded 3.0 liters of clear yellow fluid.  No immediate complications.  EBL = 0 mL. Patient tolerated well.   Specimen was sent for labs.  Please see imaging section of Epic for full dictation.   Earley Abide PA-C 08/02/2020 12:57 PM

## 2020-08-02 NOTE — Telephone Encounter (Signed)
Yes, Beth please get office note when available.  Patient needs diuretics but we need Nephrology's guidance because of his renal function. Please make sure patient is following a 2 gram sodium diet. Thanks

## 2020-08-05 LAB — PATHOLOGIST SMEAR REVIEW

## 2020-08-05 LAB — CULTURE, BODY FLUID W GRAM STAIN -BOTTLE

## 2020-08-06 ENCOUNTER — Inpatient Hospital Stay (HOSPITAL_BASED_OUTPATIENT_CLINIC_OR_DEPARTMENT_OTHER): Payer: Medicare Other | Admitting: Oncology

## 2020-08-06 ENCOUNTER — Inpatient Hospital Stay: Payer: Medicare Other

## 2020-08-06 ENCOUNTER — Other Ambulatory Visit: Payer: Self-pay

## 2020-08-06 ENCOUNTER — Other Ambulatory Visit: Payer: PRIVATE HEALTH INSURANCE

## 2020-08-06 ENCOUNTER — Encounter: Payer: Self-pay | Admitting: *Deleted

## 2020-08-06 ENCOUNTER — Inpatient Hospital Stay: Payer: Medicare Other | Attending: Oncology

## 2020-08-06 ENCOUNTER — Ambulatory Visit: Payer: PRIVATE HEALTH INSURANCE | Admitting: Nurse Practitioner

## 2020-08-06 VITALS — BP 111/70 | HR 99 | Temp 97.8°F | Resp 13 | Ht 68.0 in | Wt 200.6 lb

## 2020-08-06 DIAGNOSIS — Z79899 Other long term (current) drug therapy: Secondary | ICD-10-CM | POA: Diagnosis not present

## 2020-08-06 DIAGNOSIS — C220 Liver cell carcinoma: Secondary | ICD-10-CM

## 2020-08-06 DIAGNOSIS — Z5112 Encounter for antineoplastic immunotherapy: Secondary | ICD-10-CM | POA: Insufficient documentation

## 2020-08-06 LAB — CBC WITH DIFFERENTIAL (CANCER CENTER ONLY)
Abs Immature Granulocytes: 0.03 10*3/uL (ref 0.00–0.07)
Basophils Absolute: 0.1 10*3/uL (ref 0.0–0.1)
Basophils Relative: 1 %
Eosinophils Absolute: 0.5 10*3/uL (ref 0.0–0.5)
Eosinophils Relative: 7 %
HCT: 34.8 % — ABNORMAL LOW (ref 39.0–52.0)
Hemoglobin: 11.6 g/dL — ABNORMAL LOW (ref 13.0–17.0)
Immature Granulocytes: 0 %
Lymphocytes Relative: 12 %
Lymphs Abs: 0.9 10*3/uL (ref 0.7–4.0)
MCH: 35.3 pg — ABNORMAL HIGH (ref 26.0–34.0)
MCHC: 33.3 g/dL (ref 30.0–36.0)
MCV: 105.8 fL — ABNORMAL HIGH (ref 80.0–100.0)
Monocytes Absolute: 0.7 10*3/uL (ref 0.1–1.0)
Monocytes Relative: 8 %
Neutro Abs: 5.9 10*3/uL (ref 1.7–7.7)
Neutrophils Relative %: 72 %
Platelet Count: 139 10*3/uL — ABNORMAL LOW (ref 150–400)
RBC: 3.29 MIL/uL — ABNORMAL LOW (ref 4.22–5.81)
RDW: 15.9 % — ABNORMAL HIGH (ref 11.5–15.5)
WBC Count: 8.1 10*3/uL (ref 4.0–10.5)
nRBC: 0 % (ref 0.0–0.2)

## 2020-08-06 LAB — CMP (CANCER CENTER ONLY)
ALT: 33 U/L (ref 0–44)
AST: 44 U/L — ABNORMAL HIGH (ref 15–41)
Albumin: 2.6 g/dL — ABNORMAL LOW (ref 3.5–5.0)
Alkaline Phosphatase: 139 U/L — ABNORMAL HIGH (ref 38–126)
Anion gap: 10 (ref 5–15)
BUN: 43 mg/dL — ABNORMAL HIGH (ref 8–23)
CO2: 21 mmol/L — ABNORMAL LOW (ref 22–32)
Calcium: 8.3 mg/dL — ABNORMAL LOW (ref 8.9–10.3)
Chloride: 106 mmol/L (ref 98–111)
Creatinine: 3.1 mg/dL (ref 0.61–1.24)
GFR, Estimated: 21 mL/min — ABNORMAL LOW (ref >60)
Glucose, Bld: 114 mg/dL — ABNORMAL HIGH (ref 70–99)
Potassium: 3.3 mmol/L — ABNORMAL LOW (ref 3.5–5.1)
Sodium: 137 mmol/L (ref 135–145)
Total Bilirubin: 3.5 mg/dL — ABNORMAL HIGH (ref 0.3–1.2)
Total Protein: 7.7 g/dL (ref 6.5–8.1)

## 2020-08-06 MED ORDER — SODIUM CHLORIDE 0.9 % IV SOLN
Freq: Once | INTRAVENOUS | Status: AC
  Filled 2020-08-06: qty 250

## 2020-08-06 MED ORDER — SODIUM CHLORIDE 0.9 % IV SOLN
240.0000 mg | Freq: Once | INTRAVENOUS | Status: AC
  Administered 2020-08-06: 240 mg via INTRAVENOUS
  Filled 2020-08-06: qty 24

## 2020-08-06 MED ORDER — HYDROXYZINE HCL 10 MG PO TABS: 10.0000 mg | ORAL_TABLET | Freq: Three times a day (TID) | ORAL | 1 refills | Status: DC | PRN

## 2020-08-06 MED ORDER — HYDROCORTISONE 2.5 % EX CREA: TOPICAL_CREAM | Freq: Every day | CUTANEOUS | 1 refills | Status: DC

## 2020-08-06 NOTE — Progress Notes (Signed)
CRITICAL VALUE STICKER  CRITICAL VALUE:Creatinine 3.10 RECEIVER (on-site recipient of call): Manuela Schwartz, [place] NOTIFIED: 08/06/2020 @ 0955  MESSENGER (representative from lab): Cherre Huger, RN  MD NOTIFIED: Dr. Benay Spice  TIME OF NOTIFICATION: 9379  RESPONSE: No new orders. This is his baseline creatinine

## 2020-08-06 NOTE — Progress Notes (Signed)
[place] OFFICE PROGRESS NOTE   Diagnosis: Hepatocellular carcinoma  INTERVAL HISTORY:   [name] was last treated with nivolumab on 07/23/2020.  He reports feeling well at present.  He has pruritus at multiple areas including the back and shoulders.  The pruritus is relieved with hydrocortisone cream.  He is using a tube of hydrocortisone cream daily. Furosemide was prescribed by nephrology.  He reports this has caused increased urine output.  He has dizziness on the days when he takes Lasix.  He last underwent a paracentesis for 3 L on 08/02/2020.  A culture returned positive for staph hominis in an anaerobic bottle only.  He reports altered taste.  He is eating.  Objective:  Vital signs in last 24 hours:  Blood pressure 111/70, pulse 99, temperature 97.8 F (36.6 C), resp. rate 13, height 5\' 8"  (1.727 m), weight 200 lb 9.6 oz (91 kg), SpO2 100 %.    HEENT: No thrush or ulcers Resp: Lungs clear bilaterally Cardio: Irregular GI: Mildly distended with ascites, no hepatosplenomegaly, nontender Vascular: No leg edema  Skin: Mild erythematous maculopapular rash at the lower back, upper arms, anterior chest, and legs    Lab Results:  Lab Results  Component Value Date   WBC 8.1 08/06/2020   HGB 11.6 (L) 08/06/2020   HCT 34.8 (L) 08/06/2020   MCV 105.8 (H) 08/06/2020   PLT 139 (L) 08/06/2020   NEUTROABS 5.9 08/06/2020    CMP  Lab Results  Component Value Date   NA 136 07/23/2020   K 4.4 07/23/2020   CL 109 07/23/2020   CO2 16 (L) 07/23/2020   GLUCOSE 124 (H) 07/23/2020   BUN 58 (H) 07/23/2020   CREATININE 3.11 (HH) 07/23/2020   CALCIUM 8.8 (L) 07/23/2020   PROT 7.2 07/23/2020   ALBUMIN 2.6 (L) 07/23/2020   AST 41 07/23/2020   ALT 29 07/23/2020   ALKPHOS 108 07/23/2020   BILITOT 2.6 (H) 07/23/2020   GFRNONAA 21 (L) 07/23/2020   GFRAA 37 (L) 04/08/2020    Imaging:  IR Paracentesis  Result Date: 08/02/2020 INDICATION: Patient history of  alcoholic cirrhosis, HCC, abdominal distension, and recurrent ascites. Request is made for diagnostic and therapeutic paracentesis up to 3 L. EXAM: ULTRASOUND GUIDED DIAGNOSTIC AND THERAPEUTIC PARACENTESIS MEDICATIONS: 10 mL 1% lidocaine COMPLICATIONS: None immediate. PROCEDURE: Informed written consent was obtained from the patient after a discussion of the risks, benefits and alternatives to treatment. A timeout was performed prior to the initiation of the procedure. Initial ultrasound scanning demonstrates a large amount of ascites within the right lower abdominal quadrant. The right lower abdomen was prepped and draped in the usual sterile fashion. 1% lidocaine was used for local anesthesia. Following this, a 19 gauge, 7-cm, Yueh catheter was introduced. An ultrasound image was saved for documentation purposes. The paracentesis was performed. The catheter was removed and a dressing was applied. The patient tolerated the procedure well without immediate post procedural complication. Patient received post-procedure intravenous albumin; see nursing notes for details. FINDINGS: A total of approximately 3 L of clear yellow fluid was removed. Samples were sent to the laboratory as requested by the clinical team. IMPRESSION: Successful ultrasound-guided paracentesis yielding 3 L of peritoneal fluid. Read by: Earley Abide, PA-C Electronically Signed   By: Corrie Mckusick D.O.   On: 08/02/2020 12:59    Medications: I have reviewed the patient's current medications.   Assessment/Plan: 1. Hepatocellular carcinoma ? Multiple Li-Rad 5 liver lesions noted on a CT abdomen/pelvis 01/02/2020 including  a 7.1 x 5.7 cm segment 2 lesion, there are right and left hepatic lesions ? CT abdomen/pelvis 01/02/2020-cirrhosis with varices, multiple LR 5 liver lesions, 1.1 cm fluid density lesion in the pancreas head, splenomegaly, bilateral kidney stones, small amount of right perihepatic ascites ? MRI liver 02/08/2020-cirrhosis,  numerous hyperenhancing liver lesions, largest lesion in segment two measuring 7.9 x 6.1 cm, lesions are consistent with multifocal hepatocellular carcinoma, the three largest lesions are categorized as LI-RADS5. 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 Y90 left hepatic lobe 03/01/2020 ? Cycle 1 nivolumab 04/08/2020 ? Cycle 2 nivolumab 04/22/2020 ? Cycle 3 nivolumab 05/13/2020 ? Cycle 4 nivolumab 05/27/2020 ? Cycle 5 nivolumab 06/11/2020 ? MRI liver 07/04/2020-cirrhosis with portal venous hypertension and ascites.  Decrease in size and enhancement associated with treated left liver lesions, segment 6 lesion mildly increased in size, decreased size of segment 5 lesion ? Cycle 6 nivolumab 07/09/2020 ? Tumor board 07/17/2020- not a candidate for further Y90, continue nivolumab ? Cycle 7 nivolumab 07/23/2020 ? Cycle 8 nivolumab 08/06/2020 2. 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 Hypertension 4. BPH 5. Hyperlipidemia 6. History of heavy alcohol use 7. Remote history of tobacco use 8. Renal insufficiency 9. Microscopic hematuria 10. 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 Esophageal mucosal changes consistent with long segment Barrett's esophagus present in the distal esophagus. Severe portal hypertensive gastropathy found in the entire examined stomach. Small hiatal hernia. 11. Mild right hydronephrosis with a probable calculus in the proximal third of the right ureter on the MRI 03/06/2020, renal ultrasound 04/01/2020-multiple nonobstructing bilateral renal calculi  05/22/2020-laser lithotripsy, bilateral ureter stent placement 12. Atrial fibrillation-not on anticoagulation therapy 13.  Dizziness-improved with discontinuation of Flomax.  Not  orthostatic 06/11/2020 14.  Skin rash with pruritus 08/06/2020-likely a nivolumab rash, trial of hydrocortisone cream    Disposition: [name] appears unchanged.  He has an improved performance status over the past month.  The plan is to continue nivolumab.  He appears to have a mild nivolumab related skin rash.  We prescribed hydrocortisone cream.  He will use hydroxyzine as needed.  Mr. Heber Paoli will complete another treatment with nivolumab today.  He will return for an office visit in nivolumab in 2 weeks.  He continues follow-up with Dr. Fuller Plan for management of cirrhosis.  Betsy Coder, MD  08/06/2020  9:46 AM

## 2020-08-06 NOTE — Progress Notes (Signed)
Per Dr. Benay Spice, ok to treat with Bili 3.5

## 2020-08-06 NOTE — Progress Notes (Signed)
Per Dr. Benay Spice: OK to treat w/creatinine 3.10

## 2020-08-06 NOTE — Patient Instructions (Signed)
[facility] Discharge Instructions for Patients Receiving Chemotherapy  Today you received the following chemotherapy agents: nivolumab.  To help prevent nausea and vomiting after your treatment, we encourage you to take your nausea medication as directed.   If you develop nausea and vomiting that is not controlled by your nausea medication, call the clinic.   BELOW ARE SYMPTOMS THAT SHOULD BE REPORTED IMMEDIATELY:  *FEVER GREATER THAN 100.5 F  *CHILLS WITH OR WITHOUT FEVER  NAUSEA AND VOMITING THAT IS NOT CONTROLLED WITH YOUR NAUSEA MEDICATION  *UNUSUAL SHORTNESS OF BREATH  *UNUSUAL BRUISING OR BLEEDING  TENDERNESS IN MOUTH AND THROAT WITH OR WITHOUT PRESENCE OF ULCERS  *URINARY PROBLEMS  *BOWEL PROBLEMS  UNUSUAL RASH Items with * indicate a potential emergency and should be followed up as soon as possible.  Feel free to call the clinic should you have any questions or concerns. The clinic phone number is [phone].  Please show the CHEMO ALERT CARD at check-in to the Emergency Department and triage nurse.   

## 2020-08-07 ENCOUNTER — Telehealth: Payer: Self-pay | Admitting: Oncology

## 2020-08-07 NOTE — Telephone Encounter (Signed)
Scheduled appointments per 2/1 los. Spoke to patient who is aware of appointments date and times.

## 2020-08-14 DIAGNOSIS — I129 Hypertensive chronic kidney disease with stage 1 through stage 4 chronic kidney disease, or unspecified chronic kidney disease: Secondary | ICD-10-CM | POA: Diagnosis not present

## 2020-08-15 ENCOUNTER — Telehealth: Payer: Self-pay | Admitting: Gastroenterology

## 2020-08-15 ENCOUNTER — Other Ambulatory Visit: Payer: Self-pay

## 2020-08-15 DIAGNOSIS — K7460 Unspecified cirrhosis of liver: Secondary | ICD-10-CM

## 2020-08-15 DIAGNOSIS — R188 Other ascites: Secondary | ICD-10-CM

## 2020-08-15 NOTE — Telephone Encounter (Signed)
Please have him report his current weight and the weight gain he has noted since last paracentesis. Max of 3L with IV albumin.

## 2020-08-15 NOTE — Telephone Encounter (Signed)
Patient is requesting paracentesis.  Okay to order?  How many liters max? Albumin?

## 2020-08-15 NOTE — Telephone Encounter (Signed)
Patient reports 5 lb weight gain since paracentesis.  He is taking furosemide 40 mg on M, Wed, Fri.  He is encouraged to discuss with his nephrologist if he can increase his diuretics.  He is advised not to adjust his dosage on his own without their orders. Patient does not understand that the paracentesis will be a long term management for his ascites.  He wants to know " when this will end?".  We discussed that he is decompensated liver failure and that the diuretics and the paracentesis will help manage his ascites, but it is very important to discuss his diuretic dosage with nephrology.    Discussed with Dr. Fuller Plan- 46 GM IV albumin with each paracentesis due to his severe renal compromise.  Max fluid removal with paracentesis is 3 Liters.   Patient has been scheduled for 08/19/20 at [facility] at 10:00.  Patient notified of appointment date and time.

## 2020-08-15 NOTE — Telephone Encounter (Signed)
Inbound call from patient stating he needs to schedule for paracentesis as soon as possible please.

## 2020-08-18 ENCOUNTER — Other Ambulatory Visit: Payer: Self-pay | Admitting: Oncology

## 2020-08-19 ENCOUNTER — Ambulatory Visit (HOSPITAL_COMMUNITY)
Admission: RE | Admit: 2020-08-19 | Discharge: 2020-08-19 | Disposition: A | Discharge status: Home / Self Care | Payer: Medicare Other | Source: Ambulatory Visit | Attending: Gastroenterology | Admitting: Gastroenterology

## 2020-08-19 ENCOUNTER — Other Ambulatory Visit: Payer: Self-pay

## 2020-08-19 DIAGNOSIS — K7460 Unspecified cirrhosis of liver: Secondary | ICD-10-CM | POA: Diagnosis not present

## 2020-08-19 DIAGNOSIS — R188 Other ascites: Secondary | ICD-10-CM | POA: Insufficient documentation

## 2020-08-19 HISTORY — PX: IR PARACENTESIS: IMG2679

## 2020-08-19 MED ORDER — LIDOCAINE HCL 1 % IJ SOLN
INTRAMUSCULAR | Status: AC
  Filled 2020-08-19: qty 20

## 2020-08-19 MED ORDER — ALBUMIN HUMAN 25 % IV SOLN
INTRAVENOUS | Status: AC
  Filled 2020-08-19: qty 200

## 2020-08-19 MED ORDER — LIDOCAINE HCL 1 % IJ SOLN
INTRAMUSCULAR | Status: DC | PRN
  Administered 2020-08-19: 10 mL

## 2020-08-19 MED ORDER — ALBUMIN HUMAN 25 % IV SOLN
50.0000 g | Freq: Once | INTRAVENOUS | Status: AC
  Administered 2020-08-19: 50 g via INTRAVENOUS
  Filled 2020-08-19: qty 200

## 2020-08-19 NOTE — Procedures (Signed)
PROCEDURE SUMMARY:  Successful US guided paracentesis from left lateral abdomen.  Yielded 3.4 liters of yellow fluid.  No immediate complications.  Pt tolerated well.   Specimen was not sent for labs. Patient received albumin.  EBL < 77mL  Docia Barrier PA-C 08/19/2020 11:51 AM

## 2020-08-20 ENCOUNTER — Inpatient Hospital Stay: Payer: Medicare Other

## 2020-08-20 ENCOUNTER — Other Ambulatory Visit: Payer: Self-pay

## 2020-08-20 ENCOUNTER — Inpatient Hospital Stay (HOSPITAL_BASED_OUTPATIENT_CLINIC_OR_DEPARTMENT_OTHER): Payer: Medicare Other | Admitting: Oncology

## 2020-08-20 VITALS — HR 84

## 2020-08-20 VITALS — BP 102/76 | HR 109 | Temp 97.0°F | Resp 16 | Ht 68.0 in | Wt 198.0 lb

## 2020-08-20 DIAGNOSIS — C220 Liver cell carcinoma: Secondary | ICD-10-CM

## 2020-08-20 DIAGNOSIS — Z5112 Encounter for antineoplastic immunotherapy: Secondary | ICD-10-CM | POA: Diagnosis not present

## 2020-08-20 DIAGNOSIS — Z79899 Other long term (current) drug therapy: Secondary | ICD-10-CM | POA: Diagnosis not present

## 2020-08-20 LAB — CBC WITH DIFFERENTIAL (CANCER CENTER ONLY)
Abs Immature Granulocytes: 0.02 10*3/uL (ref 0.00–0.07)
Basophils Absolute: 0.1 10*3/uL (ref 0.0–0.1)
Basophils Relative: 1 %
Eosinophils Absolute: 0.4 10*3/uL (ref 0.0–0.5)
Eosinophils Relative: 6 %
HCT: 33.6 % — ABNORMAL LOW (ref 39.0–52.0)
Hemoglobin: 11 g/dL — ABNORMAL LOW (ref 13.0–17.0)
Immature Granulocytes: 0 %
Lymphocytes Relative: 15 %
Lymphs Abs: 0.9 10*3/uL (ref 0.7–4.0)
MCH: 35.4 pg — ABNORMAL HIGH (ref 26.0–34.0)
MCHC: 32.7 g/dL (ref 30.0–36.0)
MCV: 108 fL — ABNORMAL HIGH (ref 80.0–100.0)
Monocytes Absolute: 0.6 10*3/uL (ref 0.1–1.0)
Monocytes Relative: 10 %
Neutro Abs: 4.1 10*3/uL (ref 1.7–7.7)
Neutrophils Relative %: 68 %
Platelet Count: 112 10*3/uL — ABNORMAL LOW (ref 150–400)
RBC: 3.11 MIL/uL — ABNORMAL LOW (ref 4.22–5.81)
RDW: 17.1 % — ABNORMAL HIGH (ref 11.5–15.5)
WBC Count: 6.1 10*3/uL (ref 4.0–10.5)
nRBC: 0 % (ref 0.0–0.2)

## 2020-08-20 LAB — CMP (CANCER CENTER ONLY)
ALT: 29 U/L (ref 0–44)
AST: 47 U/L — ABNORMAL HIGH (ref 15–41)
Albumin: 2.9 g/dL — ABNORMAL LOW (ref 3.5–5.0)
Alkaline Phosphatase: 144 U/L — ABNORMAL HIGH (ref 38–126)
Anion gap: 10 (ref 5–15)
BUN: 46 mg/dL — ABNORMAL HIGH (ref 8–23)
CO2: 18 mmol/L — ABNORMAL LOW (ref 22–32)
Calcium: 8.8 mg/dL — ABNORMAL LOW (ref 8.9–10.3)
Chloride: 110 mmol/L (ref 98–111)
Creatinine: 2.85 mg/dL — ABNORMAL HIGH (ref 0.61–1.24)
GFR, Estimated: 23 mL/min — ABNORMAL LOW (ref >60)
Glucose, Bld: 123 mg/dL — ABNORMAL HIGH (ref 70–99)
Potassium: 4.2 mmol/L (ref 3.5–5.1)
Sodium: 138 mmol/L (ref 135–145)
Total Bilirubin: 3.1 mg/dL — ABNORMAL HIGH (ref 0.3–1.2)
Total Protein: 7.5 g/dL (ref 6.5–8.1)

## 2020-08-20 MED ORDER — SODIUM CHLORIDE 0.9 % IV SOLN
Freq: Once | INTRAVENOUS | Status: AC
  Filled 2020-08-20: qty 250

## 2020-08-20 MED ORDER — NIVOLUMAB CHEMO INJECTION 100 MG/10ML
240.0000 mg | Freq: Once | INTRAVENOUS | Status: AC
  Administered 2020-08-20: 240 mg via INTRAVENOUS
  Filled 2020-08-20: qty 24

## 2020-08-20 NOTE — Patient Instructions (Signed)
[facility] Discharge Instructions for Patients Receiving Chemotherapy  Today you received the following chemotherapy agents: nivolumab.  To help prevent nausea and vomiting after your treatment, we encourage you to take your nausea medication as directed.   If you develop nausea and vomiting that is not controlled by your nausea medication, call the clinic.   BELOW ARE SYMPTOMS THAT SHOULD BE REPORTED IMMEDIATELY:  *FEVER GREATER THAN 100.5 F  *CHILLS WITH OR WITHOUT FEVER  NAUSEA AND VOMITING THAT IS NOT CONTROLLED WITH YOUR NAUSEA MEDICATION  *UNUSUAL SHORTNESS OF BREATH  *UNUSUAL BRUISING OR BLEEDING  TENDERNESS IN MOUTH AND THROAT WITH OR WITHOUT PRESENCE OF ULCERS  *URINARY PROBLEMS  *BOWEL PROBLEMS  UNUSUAL RASH Items with * indicate a potential emergency and should be followed up as soon as possible.  Feel free to call the clinic should you have any questions or concerns. The clinic phone number is [phone].  Please show the CHEMO ALERT CARD at check-in to the Emergency Department and triage nurse.   

## 2020-08-20 NOTE — Progress Notes (Signed)
[facility] OFFICE PROGRESS NOTE   Diagnosis: Hepatocellular carcinoma  INTERVAL HISTORY:   Dillon Perkins completed another treatment nivolumab on 08/06/2020.  No diarrhea.  He continues to have pruritus.  He has generalized weakness and orthostatic symptoms.  He underwent a paracentesis yesterday for 3.4 L of fluid.  He has altered taste and a decreased energy level.  Objective:  Vital signs in last 24 hours:  Blood pressure 102/76, pulse (!) 109, temperature (!) 97 F (36.1 C), temperature source Tympanic, resp. rate 16, height 5\' 8"  (1.727 m), weight 198 lb (89.8 kg), SpO2 100 %.    HEENT: No thrush or ulcers Resp: Lungs clear bilaterally Cardio: Irregular GI: No hepatosplenomegaly, mildly distended Vascular: No leg edema     Lab Results:  Lab Results  Component Value Date   WBC 6.1 08/20/2020   HGB 11.0 (L) 08/20/2020   HCT 33.6 (L) 08/20/2020   MCV 108.0 (H) 08/20/2020   PLT 112 (L) 08/20/2020   NEUTROABS 4.1 08/20/2020    CMP  Lab Results  Component Value Date   NA 138 08/20/2020   K 4.2 08/20/2020   CL 110 08/20/2020   CO2 18 (L) 08/20/2020   GLUCOSE 123 (H) 08/20/2020   BUN 46 (H) 08/20/2020   CREATININE 2.85 (H) 08/20/2020   CALCIUM 8.8 (L) 08/20/2020   PROT 7.5 08/20/2020   ALBUMIN 2.9 (L) 08/20/2020   AST 47 (H) 08/20/2020   ALT 29 08/20/2020   ALKPHOS 144 (H) 08/20/2020   BILITOT 3.1 (H) 08/20/2020   GFRNONAA 23 (L) 08/20/2020   GFRAA 37 (L) 04/08/2020      IR Paracentesis  Result Date: 08/19/2020 INDICATION: Patient with history of cirrhosis, recurrent ascites. Request is made for therapeutic paracentesis of 3 L maximum. Patient to receive albumin with procedure today. EXAM: ULTRASOUND GUIDED THERAPEUTIC PARACENTESIS MEDICATIONS: 10 mL 1% lidocaine COMPLICATIONS: None immediate. PROCEDURE: Informed written consent was obtained from the patient after a discussion of the risks, benefits and alternatives to treatment. A timeout was  performed prior to the initiation of the procedure. Initial ultrasound scanning demonstrates a moderate amount of ascites within the left lateral abdomen. The left lateral abdomen was prepped and draped in the usual sterile fashion. 1% lidocaine was used for local anesthesia. Following this, a 19 gauge, 7-cm, Yueh catheter was introduced. An ultrasound image was saved for documentation purposes. The paracentesis was performed. The catheter was removed and a dressing was applied. The patient tolerated the procedure well without immediate post procedural complication. FINDINGS: A total of approximately 3.4 liters of yellow fluid was removed. IMPRESSION: Successful ultrasound-guided paracentesis yielding 3.4 liters of peritoneal fluid. Read by: Brynda Greathouse PA-C Electronically Signed   By: Corrie Mckusick D.O.   On: 08/19/2020 11:56    Medications: I have reviewed the patient's current medications.   Assessment/Plan: 1. Hepatocellular carcinoma ? Multiple Li-Rad 5 liver lesions noted on a CT abdomen/pelvis 01/02/2020 including a 7.1 x 5.7 cm segment 2 lesion, there are right and left hepatic lesions ? CT abdomen/pelvis 01/02/2020-cirrhosis with varices, multiple LR 5 liver lesions, 1.1 cm fluid density lesion in the pancreas head, splenomegaly, bilateral kidney stones, small amount of right perihepatic ascites ? MRI liver 02/08/2020-cirrhosis, numerous hyperenhancing liver lesions, largest lesion in segment two measuring 7.9 x 6.1 cm, lesions are consistent with multifocal hepatocellular carcinoma, the three largest lesions are categorized as LI-RADS5. 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  Y90 left hepatic lobe 03/01/2020 ? Cycle 1 nivolumab 04/08/2020 ? Cycle 2 nivolumab 04/22/2020 ? Cycle 3 nivolumab 05/13/2020 ? Cycle 4 nivolumab 05/27/2020 ? Cycle 5 nivolumab 06/11/2020 ? MRI liver 07/04/2020-cirrhosis  with portal venous hypertension and ascites.  Decrease in size and enhancement associated with treated left liver lesions, segment 6 lesion mildly increased in size, decreased size of segment 5 lesion ? Cycle 6 nivolumab 07/09/2020 ? Tumor board 07/17/2020- not a candidate for further Y90, continue nivolumab ? Cycle 7 nivolumab 07/23/2020 ? Cycle 8 nivolumab 08/06/2020 ? Cycle 9 nivolumab 08/20/2020 2. 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 Hypertension 4. BPH 5. Hyperlipidemia 6. History of heavy alcohol use 7. Remote history of tobacco use 8. Renal insufficiency 9. Microscopic hematuria 10. 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 Esophageal mucosal changes consistent with long segment Barrett's esophagus present in the distal esophagus. Severe portal hypertensive gastropathy found in the entire examined stomach. Small hiatal hernia. 11. Mild right hydronephrosis with a probable calculus in the proximal third of the right ureter on the MRI 03/06/2020, renal ultrasound 04/01/2020-multiple nonobstructing bilateral renal calculi  05/22/2020-laser lithotripsy, bilateral ureter stent placement 12. Atrial fibrillation-not on anticoagulation therapy 13.  Dizziness-improved with discontinuation of Flomax.  Not orthostatic 06/11/2020 14.  Skin rash with pruritus 08/06/2020-likely a nivolumab rash, trial of hydrocortisone cream      Disposition: Dillon Perkins appears unchanged.  The malaise is likely secondary to hepatocellular carcinoma, chronic disease, and renal failure.  Orthostasis symptoms may be related to frequent paracentesis procedures.  He will complete another treatment with nivolumab today.  Dillon Perkins will continue follow-up with GI and nephrology for management of the ascites in the setting of renal failure.  He will return for an  office visit in the next treatment with nivolumab in 2 weeks.  We will follow up on the AFP from today.  We will plan for a restaging liver MRI at the end of March 2022.  Betsy Coder, MD  08/20/2020  9:32 AM

## 2020-08-20 NOTE — Progress Notes (Signed)
Per Dr. Benay Spice: OK to treat w/creatinine 2.85 (better).

## 2020-08-21 ENCOUNTER — Telehealth: Payer: Self-pay | Admitting: Oncology

## 2020-08-21 LAB — AFP TUMOR MARKER: AFP, Serum, Tumor Marker: 6.2 ng/mL (ref 0.0–8.3)

## 2020-08-21 NOTE — Telephone Encounter (Signed)
Scheduled appointments per 2/15 los. Called patient, no answer. Left message with appointments dates and times.  

## 2020-08-23 DIAGNOSIS — I129 Hypertensive chronic kidney disease with stage 1 through stage 4 chronic kidney disease, or unspecified chronic kidney disease: Secondary | ICD-10-CM | POA: Diagnosis not present

## 2020-08-23 DIAGNOSIS — N200 Calculus of kidney: Secondary | ICD-10-CM | POA: Diagnosis not present

## 2020-08-23 DIAGNOSIS — N400 Enlarged prostate without lower urinary tract symptoms: Secondary | ICD-10-CM | POA: Diagnosis not present

## 2020-08-23 DIAGNOSIS — R809 Proteinuria, unspecified: Secondary | ICD-10-CM | POA: Diagnosis not present

## 2020-08-23 DIAGNOSIS — K7031 Alcoholic cirrhosis of liver with ascites: Secondary | ICD-10-CM | POA: Diagnosis not present

## 2020-08-23 DIAGNOSIS — N184 Chronic kidney disease, stage 4 (severe): Secondary | ICD-10-CM | POA: Diagnosis not present

## 2020-09-01 ENCOUNTER — Other Ambulatory Visit: Payer: Self-pay | Admitting: Oncology

## 2020-09-02 ENCOUNTER — Ambulatory Visit: Payer: Medicare Other | Admitting: Cardiology

## 2020-09-03 ENCOUNTER — Telehealth: Payer: Self-pay | Admitting: Oncology

## 2020-09-03 ENCOUNTER — Inpatient Hospital Stay (HOSPITAL_BASED_OUTPATIENT_CLINIC_OR_DEPARTMENT_OTHER): Payer: Medicare Other | Admitting: Nurse Practitioner

## 2020-09-03 ENCOUNTER — Other Ambulatory Visit: Payer: Self-pay

## 2020-09-03 ENCOUNTER — Encounter: Payer: Self-pay | Admitting: Nurse Practitioner

## 2020-09-03 ENCOUNTER — Inpatient Hospital Stay: Payer: Medicare Other

## 2020-09-03 ENCOUNTER — Inpatient Hospital Stay: Payer: Medicare Other | Attending: Oncology

## 2020-09-03 VITALS — BP 120/83 | HR 79 | Temp 97.8°F | Resp 18 | Ht 68.0 in | Wt 201.3 lb

## 2020-09-03 DIAGNOSIS — Z79899 Other long term (current) drug therapy: Secondary | ICD-10-CM | POA: Insufficient documentation

## 2020-09-03 DIAGNOSIS — Z5112 Encounter for antineoplastic immunotherapy: Secondary | ICD-10-CM | POA: Diagnosis not present

## 2020-09-03 DIAGNOSIS — K7460 Unspecified cirrhosis of liver: Secondary | ICD-10-CM | POA: Insufficient documentation

## 2020-09-03 DIAGNOSIS — I4891 Unspecified atrial fibrillation: Secondary | ICD-10-CM | POA: Insufficient documentation

## 2020-09-03 DIAGNOSIS — Z87442 Personal history of urinary calculi: Secondary | ICD-10-CM | POA: Insufficient documentation

## 2020-09-03 DIAGNOSIS — I10 Essential (primary) hypertension: Secondary | ICD-10-CM | POA: Diagnosis not present

## 2020-09-03 DIAGNOSIS — Z87891 Personal history of nicotine dependence: Secondary | ICD-10-CM | POA: Diagnosis not present

## 2020-09-03 DIAGNOSIS — R188 Other ascites: Secondary | ICD-10-CM | POA: Diagnosis not present

## 2020-09-03 DIAGNOSIS — R5383 Other fatigue: Secondary | ICD-10-CM | POA: Diagnosis not present

## 2020-09-03 DIAGNOSIS — R7989 Other specified abnormal findings of blood chemistry: Secondary | ICD-10-CM | POA: Insufficient documentation

## 2020-09-03 DIAGNOSIS — C220 Liver cell carcinoma: Secondary | ICD-10-CM | POA: Insufficient documentation

## 2020-09-03 DIAGNOSIS — L299 Pruritus, unspecified: Secondary | ICD-10-CM | POA: Diagnosis not present

## 2020-09-03 DIAGNOSIS — N19 Unspecified kidney failure: Secondary | ICD-10-CM | POA: Insufficient documentation

## 2020-09-03 DIAGNOSIS — N400 Enlarged prostate without lower urinary tract symptoms: Secondary | ICD-10-CM | POA: Insufficient documentation

## 2020-09-03 LAB — CBC WITH DIFFERENTIAL (CANCER CENTER ONLY)
Abs Immature Granulocytes: 0.01 10*3/uL (ref 0.00–0.07)
Basophils Absolute: 0.1 10*3/uL (ref 0.0–0.1)
Basophils Relative: 1 %
Eosinophils Absolute: 0.7 10*3/uL — ABNORMAL HIGH (ref 0.0–0.5)
Eosinophils Relative: 12 %
HCT: 33.9 % — ABNORMAL LOW (ref 39.0–52.0)
Hemoglobin: 11.1 g/dL — ABNORMAL LOW (ref 13.0–17.0)
Immature Granulocytes: 0 %
Lymphocytes Relative: 17 %
Lymphs Abs: 0.9 10*3/uL (ref 0.7–4.0)
MCH: 35.5 pg — ABNORMAL HIGH (ref 26.0–34.0)
MCHC: 32.7 g/dL (ref 30.0–36.0)
MCV: 108.3 fL — ABNORMAL HIGH (ref 80.0–100.0)
Monocytes Absolute: 0.6 10*3/uL (ref 0.1–1.0)
Monocytes Relative: 10 %
Neutro Abs: 3.4 10*3/uL (ref 1.7–7.7)
Neutrophils Relative %: 60 %
Platelet Count: 143 10*3/uL — ABNORMAL LOW (ref 150–400)
RBC: 3.13 MIL/uL — ABNORMAL LOW (ref 4.22–5.81)
RDW: 17.2 % — ABNORMAL HIGH (ref 11.5–15.5)
WBC Count: 5.7 10*3/uL (ref 4.0–10.5)
nRBC: 0 % (ref 0.0–0.2)

## 2020-09-03 LAB — CMP (CANCER CENTER ONLY)
ALT: 34 U/L (ref 0–44)
AST: 49 U/L — ABNORMAL HIGH (ref 15–41)
Albumin: 2.7 g/dL — ABNORMAL LOW (ref 3.5–5.0)
Alkaline Phosphatase: 160 U/L — ABNORMAL HIGH (ref 38–126)
Anion gap: 13 (ref 5–15)
BUN: 41 mg/dL — ABNORMAL HIGH (ref 8–23)
CO2: 20 mmol/L — ABNORMAL LOW (ref 22–32)
Calcium: 8.6 mg/dL — ABNORMAL LOW (ref 8.9–10.3)
Chloride: 107 mmol/L (ref 98–111)
Creatinine: 2.75 mg/dL — ABNORMAL HIGH (ref 0.61–1.24)
GFR, Estimated: 24 mL/min — ABNORMAL LOW (ref >60)
Glucose, Bld: 158 mg/dL — ABNORMAL HIGH (ref 70–99)
Potassium: 3.5 mmol/L (ref 3.5–5.1)
Sodium: 140 mmol/L (ref 135–145)
Total Bilirubin: 3.5 mg/dL — ABNORMAL HIGH (ref 0.3–1.2)
Total Protein: 7.6 g/dL (ref 6.5–8.1)

## 2020-09-03 LAB — TSH: TSH: 2.53 u[IU]/mL (ref 0.320–4.118)

## 2020-09-03 MED ORDER — SODIUM CHLORIDE 0.9 % IV SOLN
Freq: Once | INTRAVENOUS | Status: AC
Start: 2020-09-03 — End: 2020-09-03
  Filled 2020-09-03: qty 250

## 2020-09-03 MED ORDER — SODIUM CHLORIDE 0.9 % IV SOLN
240.0000 mg | Freq: Once | INTRAVENOUS | Status: AC
  Administered 2020-09-03: 240 mg via INTRAVENOUS
  Filled 2020-09-03: qty 24

## 2020-09-03 NOTE — Patient Instructions (Signed)
[facility] Discharge Instructions for Patients Receiving Chemotherapy  Today you received the following chemotherapy agents: Nivolumab (Opdivo)  To help prevent nausea and vomiting after your treatment, we encourage you to take your nausea medication as prescribed.    If you develop nausea and vomiting that is not controlled by your nausea medication, call the clinic.   BELOW ARE SYMPTOMS THAT SHOULD BE REPORTED IMMEDIATELY:  *FEVER GREATER THAN 100.5 F  *CHILLS WITH OR WITHOUT FEVER  NAUSEA AND VOMITING THAT IS NOT CONTROLLED WITH YOUR NAUSEA MEDICATION  *UNUSUAL SHORTNESS OF BREATH  *UNUSUAL BRUISING OR BLEEDING  TENDERNESS IN MOUTH AND THROAT WITH OR WITHOUT PRESENCE OF ULCERS  *URINARY PROBLEMS  *BOWEL PROBLEMS  UNUSUAL RASH Items with * indicate a potential emergency and should be followed up as soon as possible.  Feel free to call the clinic should you have any questions or concerns. The clinic phone number is [phone].  Please show the CHEMO ALERT CARD at check-in to the Emergency Department and triage nurse.   

## 2020-09-03 NOTE — Telephone Encounter (Signed)
Scheduled appointment per 03/01 los. Patient is aware.

## 2020-09-03 NOTE — Progress Notes (Addendum)
[place] OFFICE PROGRESS NOTE   Diagnosis: Hepatocellular carcinoma  INTERVAL HISTORY:   Dillon Perkins returns as scheduled.  He completed another cycle of nivolumab 08/20/2020.  No diarrhea.  He continues to have generalized pruritus.  He is applying a topical cortisone cream.  He continues to have intermittent dizziness.  He had a paracentesis 08/19/2020.  He feels that overall his condition is stable.  Objective:  Vital signs in last 24 hours:  Blood pressure 120/83, pulse 79, temperature 97.8 F (36.6 C), temperature source Tympanic, resp. rate 18, height 5\' 8"  (1.727 m), weight 201 lb 4.8 oz (91.3 kg), SpO2 100 %.    HEENT: No thrush or ulcers. Resp: Lungs clear bilaterally. Cardio: Irregular. GI: Abdomen is distended consistent with ascites.  No hepatosplenomegaly. Vascular: No leg edema.  Skin: Skin in general has a dry appearance.  No rash.  Superficial abrasions at the lower legs consistent with scratching.   Lab Results:  Lab Results  Component Value Date   WBC 5.7 09/03/2020   HGB 11.1 (L) 09/03/2020   HCT 33.9 (L) 09/03/2020   MCV 108.3 (H) 09/03/2020   PLT 143 (L) 09/03/2020   NEUTROABS 3.4 09/03/2020    Imaging:  No results found.  Medications: I have reviewed the patient's current medications.  Assessment/Plan: 1. Hepatocellular carcinoma ? Multiple Li-Rad 5 liver lesions noted on a CT abdomen/pelvis 01/02/2020 including a 7.1 x 5.7 cm segment 2 lesion, there are right and left hepatic lesions ? CT abdomen/pelvis 01/02/2020-cirrhosis with varices, multiple LR 5 liver lesions, 1.1 cm fluid density lesion in the pancreas head, splenomegaly, bilateral kidney stones, small amount of right perihepatic ascites ? MRI liver 02/08/2020-cirrhosis, numerous hyperenhancing liver lesions, largest lesion in segment two measuring 7.9 x 6.1 cm, lesions are consistent with multifocal hepatocellular carcinoma, the three largest lesions are categorized as  LI-RADS5. 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 Y90 left hepatic lobe 03/01/2020 ? Cycle 1 nivolumab 04/08/2020 ? Cycle 2 nivolumab 04/22/2020 ? Cycle 3 nivolumab 05/13/2020 ? Cycle 4 nivolumab 05/27/2020 ? Cycle 5 nivolumab 06/11/2020 ? MRI liver 07/04/2020-cirrhosis with portal venous hypertension and ascites. Decrease in size and enhancement associated with treated left liver lesions, segment 6 lesion mildly increased in size, decreased size of segment 5 lesion ? Cycle 6 nivolumab 07/09/2020 ? Tumor board 07/17/2020- not a candidate for further Y90, continue nivolumab ? Cycle 7 nivolumab 07/23/2020 ? Cycle 8 nivolumab 08/06/2020 ? Cycle 9 nivolumab 08/20/2020 ? Cycle 10 nivolumab 09/03/2020 2. 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 Hypertension 4. BPH 5. Hyperlipidemia 6. History of heavy alcohol use 7. Remote history of tobacco use 8. Renal insufficiency 9. Microscopic hematuria 10. 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 Esophageal mucosal changes consistent with long segment Barrett's esophagus present in the distal esophagus. Severe portal hypertensive gastropathy found in the entire examined stomach. Small hiatal hernia. 11. Mild right hydronephrosis with a probable calculus in the proximal third of the right ureter on the MRI 03/06/2020, renal ultrasound 04/01/2020-multiple nonobstructing bilateral renal calculi  05/22/2020-laser lithotripsy, bilateral ureter stent placement 12. Atrial fibrillation-not on anticoagulation therapy 13. Dizziness-improved with discontinuation of Flomax. Not orthostatic 06/11/2020 14.  Skin rash with pruritus 08/06/2020-likely a nivolumab rash, trial of hydrocortisone cream   Disposition: Mr.  Perkins appears unchanged.  He continues nivolumab.  Plan to proceed with treatment today as scheduled.  Restaging MRI of the liver at the end of March 2022.  We reviewed the CBC and chemistry panel from today.  Labs adequate to proceed with treatment.  Creatinine and bilirubin with stable elevation.  The pruritus is likely secondary to cirrhosis/liver dysfunction.  He will continue symptomatic care.  He will return for lab, follow-up, nivolumab on 09/24/2020 (3-week interval at his request due to a vacation).  He will contact the office in the interim with any problems.  Patient seen with Dr. Benay Spice.   Ned Card ANP/GNP-BC   09/03/2020  9:40 AM This was a shared visit with Ned Card.  [name] appears stable.  I reviewed the labs from today.  The plan is to continue nivolumab.  We will plan for a restaging liver MRI within the next 1-2 months.  The pruritus is most likely secondary to cirrhosis and hyperbilirubinemia.  I was present for today's visit.  I performed medical decision making.  Julieanne Manson, MD

## 2020-09-04 ENCOUNTER — Ambulatory Visit: Payer: Medicare Other | Admitting: Gastroenterology

## 2020-09-04 LAB — AFP TUMOR MARKER: AFP, Serum, Tumor Marker: 8.7 ng/mL — ABNORMAL HIGH (ref 0.0–8.3)

## 2020-09-05 ENCOUNTER — Telehealth: Payer: Self-pay | Admitting: Gastroenterology

## 2020-09-05 DIAGNOSIS — K7460 Unspecified cirrhosis of liver: Secondary | ICD-10-CM

## 2020-09-05 DIAGNOSIS — N1831 Chronic kidney disease, stage 3a: Secondary | ICD-10-CM | POA: Insufficient documentation

## 2020-09-05 NOTE — Progress Notes (Signed)
Cardiology Office Note   Date:  09/06/2020   ID:  Dillon Perkins, DOB [birth year], MRN [id number]  PCP:  Dillon Contras, MD  Cardiologist:   Dillon Breeding, MD   Chief Complaint  Patient presents with  . Palpitations      History of Present Illness: Dillon Perkins is a 70 y.o. male who presents for evaluation of atrial fib. He has had a mildly reduced EF.  He is currently being treated for hepatocellular carcinoma with nivolumab.  He has had paracentesis.  I did review these records.  Last time he took 3 L.  They have to give him albumin.  He has some low blood pressures.  He has progressive renal insufficiency.  MRI and December demonstrated that his cancer was stable and he has follow-up scheduled for April.  He presents with increased heart rate.  He does not really feel this.  Only after he has paracentesis and is walking out of radiology does he have some mild chest discomfort.  He is not sure exactly what his heart rate is doing at home but he is not having any presyncope or syncope.  He denies any PND or orthopnea..  He has no lightheadedness particularly he thinks related to his spironolactone.  Of note I had him on Eliquis but he could not afford this.  He did not want to start warfarin.  I did note that his INR was elevated probably with some degree of auto anticoagulation.   Past Medical History:  Diagnosis Date  . Alcoholic cirrhosis ([place])   . Atrial fibrillation ([place])   . GERD (gastroesophageal reflux disease)   . Hepatocellular carcinoma ([place])   . History of chemotherapy taking immunotherapy   last dose 05-13-2020  . History of kidney stones   . Hyperlipidemia   . Hypertension     Past Surgical History:  Procedure Laterality Date  . APPENDECTOMY  yrs ago  . CYSTOSCOPY/URETEROSCOPY/HOLMIUM LASER/STENT PLACEMENT Bilateral 05/22/2020   Procedure: CYSTOSCOPY/RETROGRADE/URETEROSCOPY/HOLMIUM LASER/STENT PLACEMENT STONE BASKET RETRIVAL ;  Surgeon: Ceasar Mons, MD;  Location: [facility];  Service: Urology;  Laterality: Bilateral;  . IR ANGIOGRAM SELECTIVE EACH ADDITIONAL VESSEL  02/19/2020  . IR ANGIOGRAM SELECTIVE EACH ADDITIONAL VESSEL  02/19/2020  . IR ANGIOGRAM SELECTIVE EACH ADDITIONAL VESSEL  02/19/2020  . IR ANGIOGRAM SELECTIVE EACH ADDITIONAL VESSEL  02/19/2020  . IR ANGIOGRAM SELECTIVE EACH ADDITIONAL VESSEL  02/19/2020  . IR ANGIOGRAM SELECTIVE EACH ADDITIONAL VESSEL  02/19/2020  . IR ANGIOGRAM SELECTIVE EACH ADDITIONAL VESSEL  03/01/2020  . IR ANGIOGRAM SELECTIVE EACH ADDITIONAL VESSEL  03/01/2020  . IR ANGIOGRAM SELECTIVE EACH ADDITIONAL VESSEL  03/01/2020  . IR ANGIOGRAM VISCERAL SELECTIVE  02/19/2020  . IR ANGIOGRAM VISCERAL SELECTIVE  03/01/2020  . IR EMBO ARTERIAL NOT HEMORR HEMANG INC GUIDE ROADMAPPING  02/19/2020  . IR EMBO TUMOR ORGAN ISCHEMIA INFARCT INC GUIDE ROADMAPPING  02/19/2020  . IR EMBO TUMOR ORGAN ISCHEMIA INFARCT INC GUIDE ROADMAPPING  03/01/2020  . IR PARACENTESIS  07/15/2020  . IR PARACENTESIS  08/02/2020  . IR PARACENTESIS  08/19/2020  . IR RADIOLOGIST EVAL & MGMT  01/30/2020  . IR US GUIDE VASC ACCESS RIGHT  02/19/2020  . IR US GUIDE VASC ACCESS RIGHT  03/01/2020     Current Outpatient Medications  Medication Sig Dispense Refill  . Coenzyme Q10 (COQ10) 100 MG CAPS Take 100 mg by mouth daily.     Marland Kitchen diltiazem (CARDIZEM) 30 MG tablet Take 1 tablet (30 mg  total) by mouth 3 (three) times daily. 270 tablet 3  . furosemide (LASIX) 40 MG tablet Take 40 mg by mouth. Take one pill on Monday, Wednesday and Friday    . LORazepam (ATIVAN) 0.5 MG tablet Take 0.5 tablets (0.25 mg total) by mouth at bedtime as needed for anxiety or sleep. 30 tablet 0  . pantoprazole (PROTONIX) 40 MG tablet Take 1 tablet (40 mg total) by mouth daily. 90 tablet 3  . pravastatin (PRAVACHOL) 40 MG tablet Take 40 mg by mouth at bedtime.     Marland Kitchen spironolactone (ALDACTONE) 50 MG tablet TAKE ONE TABLET BY MOUTH DAILY 90 tablet 0   No  current facility-administered medications for this visit.    Allergies:   Losartan potassium and Other    ROS:  Please see the history of present illness.   Otherwise, review of systems are positive for none.   All other systems are reviewed and negative.    PHYSICAL EXAM: VS:  BP 130/84   Pulse (!) 127   Ht 5\' 8"  (1.727 m)   Wt 204 lb 3.2 oz (92.6 kg)   SpO2 94%   BMI 31.05 kg/m  , BMI Body mass index is 31.05 kg/m. GENERAL: Chronically ill appearing NECK:  No jugular venous distention, waveform within normal limits, carotid upstroke brisk and symmetric, no bruits, no thyromegaly LUNGS:  Clear to auscultation bilaterally CHEST:  Unremarkable HEART:  PMI not displaced or sustained,S1 and S2 within normal limits, no S3, no clicks, no rubs, no murmurs, irregular. ABD: Distended, positive bowel sounds normal in frequency in pitch, no bruits, no rebound, no guarding, no midline pulsatile mass, no hepatomegaly, no splenomegaly, obvious ascites EXT:  2 plus pulses throughout, no edema, no cyanosis no clubbing   EKG:  EKG is  ordered today. The ekg ordered today demonstrates atrial fibrillation, rate 127, axis within normal limits, intervals within normal limits, no acute ST-T wave changes.   Recent Labs: 07/04/2020: B Natriuretic Peptide 2,740.9; Magnesium 2.0 09/03/2020: ALT 34; BUN 41; Creatinine 2.75; Hemoglobin 11.1; Platelet Count 143; Potassium 3.5; Sodium 140; TSH 2.530    Lipid Panel    Component Value Date/Time   CHOL 152 06/11/2020 0909   TRIG 62 06/11/2020 0909   HDL 28 (L) 06/11/2020 0909   CHOLHDL 5.4 06/11/2020 0909   VLDL 12 06/11/2020 0909   LDLCALC 112 (H) 06/11/2020 0909      Wt Readings from Last 3 Encounters:  09/06/20 204 lb 3.2 oz (92.6 kg)  09/03/20 201 lb 4.8 oz (91.3 kg)  08/20/20 198 lb (89.8 kg)      Other studies Reviewed: Additional studies/ records that were reviewed today include: Oncology and GI records. Review of the above records  demonstrates:  Please see elsewhere in the note.     ASSESSMENT AND PLAN:   Atrial fibrillation  Dillon Perkins has a CHA2DS2 - VASc score of 2.  He has not been able to afford DOAC and does not really want to consider this.  He does not consider warfarin.  There probably is some degree of auto anticoagulation.  His rate is too fast.  He has been sensitive to medications and is previously been taken off of higher dose Cardizem.  I am going to try to use low-dose immediate release Cardizem twice daily and we talked about dosing this based on heart rates and how to keep a check on his heart rate with a pulse oximeter.  I would invite him to send  me these results in my chart.   Essential hypertension The blood pressures is not elevated.  He is keeping an eye on this.  CKD III Creatinine was elevated at 2.75.  This is followed by his primary provider.    Current medicines are reviewed at length with the patient today.  The patient does not have concerns regarding medicines.  The following changes have been made:  As above  Labs/ tests ordered today include:   Orders Placed This Encounter  Procedures  . EKG 12-Lead     Disposition:   FU with me in 2 months.   Signed, Dillon Breeding, MD  09/06/2020 3:59 PM    [facility]

## 2020-09-05 NOTE — Telephone Encounter (Signed)
Patient reports that he is 208 lbs today. Up 7 lbs from his baseline.  He reports "I squeak when I talk, and have a big belly".   His diuretics are unchanged.  His last paracentesis was on 08/19/20.  Okay to scheduled paracentesis with albumin?  Still max of 3 liters?

## 2020-09-05 NOTE — Telephone Encounter (Signed)
Patient calling to schedule paracentisis

## 2020-09-06 ENCOUNTER — Other Ambulatory Visit: Payer: Self-pay | Admitting: Gastroenterology

## 2020-09-06 ENCOUNTER — Other Ambulatory Visit: Payer: Self-pay

## 2020-09-06 ENCOUNTER — Encounter: Payer: Self-pay | Admitting: Cardiology

## 2020-09-06 ENCOUNTER — Ambulatory Visit (INDEPENDENT_AMBULATORY_CARE_PROVIDER_SITE_OTHER): Payer: Medicare Other | Admitting: Cardiology

## 2020-09-06 VITALS — BP 130/84 | HR 127 | Ht 68.0 in | Wt 204.2 lb

## 2020-09-06 DIAGNOSIS — N1831 Chronic kidney disease, stage 3a: Secondary | ICD-10-CM | POA: Diagnosis not present

## 2020-09-06 DIAGNOSIS — K7460 Unspecified cirrhosis of liver: Secondary | ICD-10-CM

## 2020-09-06 DIAGNOSIS — I480 Paroxysmal atrial fibrillation: Secondary | ICD-10-CM | POA: Diagnosis not present

## 2020-09-06 DIAGNOSIS — R0602 Shortness of breath: Secondary | ICD-10-CM | POA: Diagnosis not present

## 2020-09-06 DIAGNOSIS — I10 Essential (primary) hypertension: Secondary | ICD-10-CM

## 2020-09-06 MED ORDER — DILTIAZEM HCL 30 MG PO TABS: 30.0000 mg | ORAL_TABLET | Freq: Three times a day (TID) | ORAL | 3 refills | Status: DC

## 2020-09-06 NOTE — Telephone Encounter (Signed)
Yes. Paracentesis - 3L max with albumin. Check with him on his next appt with nephrology as they are managing his diuretics.

## 2020-09-06 NOTE — Patient Instructions (Signed)
Medication Instructions:  START CARDIZEM 30MG  BY MOUTH THREE TIMES A DAY *If you need a refill on your cardiac medications before your next appointment, please call your pharmacy*  Follow-Up: At [facility], you and your health needs are our priority.  As part of our continuing mission to provide you with exceptional heart care, we have created designated Provider Care Teams.  These Care Teams include your primary Cardiologist (physician) and Advanced Practice Providers (APPs -  Physician Assistants and Nurse Practitioners) who all work together to provide you with the care you need, when you need it.   Your next appointment:   2 month(s)  The format for your next appointment:   In Person  Provider:   Minus Breeding, MD

## 2020-09-06 NOTE — Telephone Encounter (Signed)
Patient notified of the paracentesis at [facility] radiology on 09/09/20 12:45 arrival for 1:00 appointment . He has follow up with nephrology in the next few weeks.

## 2020-09-09 ENCOUNTER — Ambulatory Visit (HOSPITAL_COMMUNITY)
Admission: RE | Admit: 2020-09-09 | Discharge: 2020-09-09 | Disposition: A | Discharge status: Home / Self Care | Payer: Medicare Other | Source: Ambulatory Visit | Attending: Gastroenterology | Admitting: Gastroenterology

## 2020-09-09 ENCOUNTER — Other Ambulatory Visit: Payer: Self-pay

## 2020-09-09 DIAGNOSIS — R188 Other ascites: Secondary | ICD-10-CM | POA: Insufficient documentation

## 2020-09-09 DIAGNOSIS — K7460 Unspecified cirrhosis of liver: Secondary | ICD-10-CM | POA: Diagnosis not present

## 2020-09-09 HISTORY — PX: IR PARACENTESIS: IMG2679

## 2020-09-09 MED ORDER — LIDOCAINE HCL 1 % IJ SOLN
INTRAMUSCULAR | Status: DC | PRN
  Administered 2020-09-09: 10 mL

## 2020-09-09 MED ORDER — LIDOCAINE HCL 1 % IJ SOLN
INTRAMUSCULAR | Status: AC
  Filled 2020-09-09: qty 20

## 2020-09-09 NOTE — Procedures (Signed)
PROCEDURE SUMMARY:  Successful US guided paracentesis from left.  Yielded 3 L of clear yellow fluid.  No immediate complications.  Pt tolerated well.   EBL < 1 mL  Theresa Duty, NP 09/09/2020 2:04 PM

## 2020-09-16 DIAGNOSIS — C220 Liver cell carcinoma: Secondary | ICD-10-CM | POA: Diagnosis not present

## 2020-09-16 DIAGNOSIS — M25521 Pain in right elbow: Secondary | ICD-10-CM | POA: Diagnosis not present

## 2020-09-17 DIAGNOSIS — M25521 Pain in right elbow: Secondary | ICD-10-CM | POA: Diagnosis not present

## 2020-09-17 DIAGNOSIS — M7021 Olecranon bursitis, right elbow: Secondary | ICD-10-CM | POA: Diagnosis not present

## 2020-09-19 ENCOUNTER — Telehealth: Payer: Self-pay | Admitting: Oncology

## 2020-09-19 NOTE — Telephone Encounter (Signed)
I called and left a detailed message regarding his appointment changes that were made.  I advised if there was an issue with either date or time he would need to call the office.

## 2020-09-20 DIAGNOSIS — R140 Abdominal distension (gaseous): Secondary | ICD-10-CM | POA: Diagnosis not present

## 2020-09-20 DIAGNOSIS — I4891 Unspecified atrial fibrillation: Secondary | ICD-10-CM | POA: Diagnosis not present

## 2020-09-20 DIAGNOSIS — Z79899 Other long term (current) drug therapy: Secondary | ICD-10-CM | POA: Diagnosis not present

## 2020-09-20 DIAGNOSIS — R188 Other ascites: Secondary | ICD-10-CM | POA: Diagnosis not present

## 2020-09-21 DIAGNOSIS — I4891 Unspecified atrial fibrillation: Secondary | ICD-10-CM | POA: Diagnosis not present

## 2020-09-21 DIAGNOSIS — R9431 Abnormal electrocardiogram [ECG] [EKG]: Secondary | ICD-10-CM | POA: Diagnosis not present

## 2020-09-22 ENCOUNTER — Other Ambulatory Visit: Payer: Self-pay | Admitting: Oncology

## 2020-09-24 ENCOUNTER — Telehealth: Payer: Self-pay

## 2020-09-24 ENCOUNTER — Inpatient Hospital Stay: Payer: Medicare Other

## 2020-09-24 ENCOUNTER — Telehealth: Payer: Self-pay | Admitting: *Deleted

## 2020-09-24 ENCOUNTER — Inpatient Hospital Stay (HOSPITAL_BASED_OUTPATIENT_CLINIC_OR_DEPARTMENT_OTHER): Payer: Medicare Other | Admitting: Oncology

## 2020-09-24 ENCOUNTER — Other Ambulatory Visit: Payer: Self-pay

## 2020-09-24 VITALS — BP 103/75 | HR 67 | Temp 97.9°F | Resp 18 | Ht 68.0 in | Wt 186.7 lb

## 2020-09-24 DIAGNOSIS — C220 Liver cell carcinoma: Secondary | ICD-10-CM

## 2020-09-24 DIAGNOSIS — K7460 Unspecified cirrhosis of liver: Secondary | ICD-10-CM | POA: Diagnosis not present

## 2020-09-24 DIAGNOSIS — N19 Unspecified kidney failure: Secondary | ICD-10-CM | POA: Diagnosis not present

## 2020-09-24 DIAGNOSIS — I4891 Unspecified atrial fibrillation: Secondary | ICD-10-CM | POA: Diagnosis not present

## 2020-09-24 DIAGNOSIS — R188 Other ascites: Secondary | ICD-10-CM | POA: Diagnosis not present

## 2020-09-24 DIAGNOSIS — Z5112 Encounter for antineoplastic immunotherapy: Secondary | ICD-10-CM | POA: Diagnosis not present

## 2020-09-24 LAB — CBC WITH DIFFERENTIAL (CANCER CENTER ONLY)
Abs Immature Granulocytes: 0.05 10*3/uL (ref 0.00–0.07)
Basophils Absolute: 0 10*3/uL (ref 0.0–0.1)
Basophils Relative: 0 %
Eosinophils Absolute: 0.1 10*3/uL (ref 0.0–0.5)
Eosinophils Relative: 1 %
HCT: 35.2 % — ABNORMAL LOW (ref 39.0–52.0)
Hemoglobin: 11.7 g/dL — ABNORMAL LOW (ref 13.0–17.0)
Immature Granulocytes: 1 %
Lymphocytes Relative: 9 %
Lymphs Abs: 0.9 10*3/uL (ref 0.7–4.0)
MCH: 34.2 pg — ABNORMAL HIGH (ref 26.0–34.0)
MCHC: 33.2 g/dL (ref 30.0–36.0)
MCV: 102.9 fL — ABNORMAL HIGH (ref 80.0–100.0)
Monocytes Absolute: 0.7 10*3/uL (ref 0.1–1.0)
Monocytes Relative: 7 %
Neutro Abs: 8.4 10*3/uL — ABNORMAL HIGH (ref 1.7–7.7)
Neutrophils Relative %: 82 %
Platelet Count: 150 10*3/uL (ref 150–400)
RBC: 3.42 MIL/uL — ABNORMAL LOW (ref 4.22–5.81)
RDW: 17.2 % — ABNORMAL HIGH (ref 11.5–15.5)
WBC Count: 10.2 10*3/uL (ref 4.0–10.5)
nRBC: 0.2 % (ref 0.0–0.2)

## 2020-09-24 LAB — CMP (CANCER CENTER ONLY)
ALT: 49 U/L — ABNORMAL HIGH (ref 0–44)
AST: 74 U/L — ABNORMAL HIGH (ref 15–41)
Albumin: 2.5 g/dL — ABNORMAL LOW (ref 3.5–5.0)
Alkaline Phosphatase: 137 U/L — ABNORMAL HIGH (ref 38–126)
Anion gap: 17 — ABNORMAL HIGH (ref 5–15)
BUN: 83 mg/dL — ABNORMAL HIGH (ref 8–23)
CO2: 14 mmol/L — ABNORMAL LOW (ref 22–32)
Calcium: 8.9 mg/dL (ref 8.9–10.3)
Chloride: 104 mmol/L (ref 98–111)
Creatinine: 3.48 mg/dL (ref 0.61–1.24)
GFR, Estimated: 18 mL/min — ABNORMAL LOW (ref >60)
Glucose, Bld: 162 mg/dL — ABNORMAL HIGH (ref 70–99)
Potassium: 4 mmol/L (ref 3.5–5.1)
Sodium: 135 mmol/L (ref 135–145)
Total Bilirubin: 4 mg/dL (ref 0.3–1.2)
Total Protein: 7.3 g/dL (ref 6.5–8.1)

## 2020-09-24 MED ORDER — SODIUM CHLORIDE 0.9 % IV SOLN
Freq: Once | INTRAVENOUS | Status: AC
  Filled 2020-09-24: qty 250

## 2020-09-24 NOTE — Telephone Encounter (Signed)
Patient left VM that he has not been able to eat for 4 days and what small amounts he eats will come back up. Reports he is basically bedridden and very weak. Wants to be seen and is willing to go to hospital if necessary. Called back and spoke to wife at [facility] and informed her he has an appointment today at 0930--please come in so we can evaluate him. She said they can get here in time for the appointment.

## 2020-09-24 NOTE — Patient Instructions (Signed)
Rehydration, Adult Rehydration is the replacement of body fluids, salts, and minerals (electrolytes) that are lost during dehydration. Dehydration is when there is not enough water or other fluids in the body. This happens when you lose more fluids than you take in. Common causes of dehydration include:  Not drinking enough fluids. This can occur when you are ill or doing activities that require a lot of energy, especially in hot weather.  Conditions that cause loss of water or other fluids, such as diarrhea, vomiting, sweating, or urinating a lot.  Other illnesses, such as fever or infection.  Certain medicines, such as those that remove excess fluid from the body (diuretics). Symptoms of mild or moderate dehydration may include thirst, dry lips and mouth, and dizziness. Symptoms of severe dehydration may include increased heart rate, confusion, fainting, and not urinating. For severe dehydration, you may need to get fluids through an IV at the hospital. For mild or moderate dehydration, you can usually rehydrate at home by drinking certain fluids as told by your health care provider. What are the risks? Generally, rehydration is safe. However, taking in too much fluid (overhydration) can be a problem. This is rare. Overhydration can cause an electrolyte imbalance, kidney failure, or a decrease in salt (sodium) levels in the body. Supplies needed You will need an oral rehydration solution (ORS) if your health care provider tells you to use one. This is a drink to treat dehydration. It can be found in pharmacies and retail stores. How to rehydrate Fluids Follow instructions from your health care provider for rehydration. The kind of fluid and the amount you should drink depend on your condition. In general, you should choose drinks that you prefer.  If told by your health care provider, drink an ORS. ? Make an ORS by following instructions on the package. ? Start by drinking small amounts,  about  cup (120 mL) every 5-10 minutes. ? Slowly increase how much you drink until you have taken the amount recommended by your health care provider.  Drink enough clear fluids to keep your urine pale yellow. If you were told to drink an ORS, finish it first, then start slowly drinking other clear fluids. Drink fluids such as: ? Water. This includes sparkling water and flavored water. Drinking only water can lead to having too little sodium in your body (hyponatremia). Follow the advice of your health care provider. ? Water from ice chips you suck on. ? Fruit juice with water you add to it (diluted). ? Sports drinks. ? Hot or cold herbal teas. ? Broth-based soups. ? Milk or milk products. Food Follow instructions from your health care provider about what to eat while you rehydrate. Your health care provider may recommend that you slowly begin eating regular foods in small amounts.  Eat foods that contain a healthy balance of electrolytes, such as bananas, oranges, potatoes, tomatoes, and spinach.  Avoid foods that are greasy or contain a lot of sugar. In some cases, you may get nutrition through a feeding tube that is passed through your nose and into your stomach (nasogastric tube, or NG tube). This may be done if you have uncontrolled vomiting or diarrhea.   Beverages to avoid Certain beverages may make dehydration worse. While you rehydrate, avoid drinking alcohol.   How to tell if you are recovering from dehydration You may be recovering from dehydration if:  You are urinating more often than before you started rehydrating.  Your urine is pale yellow.  Your energy level   improves.  You vomit less frequently.  You have diarrhea less frequently.  Your appetite improves or returns to normal.  You feel less dizzy or less light-headed.  Your skin tone and color start to look more normal. Follow these instructions at home:  Take over-the-counter and prescription medicines only  as told by your health care provider.  Do not take sodium tablets. Doing this can lead to having too much sodium in your body (hypernatremia). Contact a health care provider if:  You continue to have symptoms of mild or moderate dehydration, such as: ? Thirst. ? Dry lips. ? Slightly dry mouth. ? Dizziness. ? Dark urine or less urine than normal. ? Muscle cramps.  You continue to vomit or have diarrhea. Get help right away if you:  Have symptoms of dehydration that get worse.  Have a fever.  Have a severe headache.  Have been vomiting and the following happens: ? Your vomiting gets worse or does not go away. ? Your vomit includes blood or green matter (bile). ? You cannot eat or drink without vomiting.  Have problems with urination or bowel movements, such as: ? Diarrhea that gets worse or does not go away. ? Blood in your stool (feces). This may cause stool to look black and tarry. ? Not urinating, or urinating only a small amount of very dark urine, within 6-8 hours.  Have trouble breathing.  Have symptoms that get worse with treatment. These symptoms may represent a serious problem that is an emergency. Do not wait to see if the symptoms will go away. Get medical help right away. Call your local emergency services (911 in the [place] Do not drive yourself to the hospital. Summary  Rehydration is the replacement of body fluids and minerals (electrolytes) that are lost during dehydration.  Follow instructions from your health care provider for rehydration. The kind of fluid and amount you should drink depend on your condition.  Slowly increase how much you drink until you have taken the amount recommended by your health care provider.  Contact your health care provider if you continue to show signs of mild or moderate dehydration. This information is not intended to replace advice given to you by your health care provider. Make sure you discuss any questions you have with  your health care provider. Document Revised: 08/23/2019 Document Reviewed: 07/03/2019 Elsevier Patient Education  2021 Elsevier Inc.  

## 2020-09-24 NOTE — Progress Notes (Signed)
[facility] OFFICE PROGRESS NOTE   Diagnosis: Hepatocellular carcinoma  INTERVAL HISTORY:   Mr. Gearhart completed another treatment with nivolumab on 09/03/2020.  He took a trip to [facility] with his wife last week.  He felt very weak while on vacation.  He discontinued Lasix, Aldactone, and diltiazem approximately 1 week ago.  He reports undergoing a paracentesis for 6.5 L of fluid on 09/20/2020 while in [place].  He returned on 09/22/2020.  He continues to feel very weak.  He has a good appetite, but his taste is altered.  He is eating and drinking very little. The abdominal fluid is reaccumulating.  Pruritus has improved.  Objective:  Vital signs in last 24 hours:  Blood pressure 103/75, pulse 67, temperature 97.9 F (36.6 C), resp. rate 18, height 5\' 8"  (1.727 m), weight 186 lb 11.2 oz (84.7 kg), SpO2 90 %.    HEENT: No thrush or ulcers Resp: Lungs clear bilaterally Cardio: Irregular, tachycardia GI: Mildly distended, no hepatosplenomegaly Vascular: No leg edema  Skin: Dry hyperpigmented rash over the trunk and extremities  Portacath/PICC-without erythema  Lab Results:  Lab Results  Component Value Date   WBC 10.2 09/24/2020   HGB 11.7 (L) 09/24/2020   HCT 35.2 (L) 09/24/2020   MCV 102.9 (H) 09/24/2020   PLT 150 09/24/2020   NEUTROABS 8.4 (H) 09/24/2020    CMP  Lab Results  Component Value Date   NA 140 09/03/2020   K 3.5 09/03/2020   CL 107 09/03/2020   CO2 20 (L) 09/03/2020   GLUCOSE 158 (H) 09/03/2020   BUN 41 (H) 09/03/2020   CREATININE 2.75 (H) 09/03/2020   CALCIUM 8.6 (L) 09/03/2020   PROT 7.6 09/03/2020   ALBUMIN 2.7 (L) 09/03/2020   AST 49 (H) 09/03/2020   ALT 34 09/03/2020   ALKPHOS 160 (H) 09/03/2020   BILITOT 3.5 (H) 09/03/2020   GFRNONAA 24 (L) 09/03/2020   GFRAA 37 (L) 04/08/2020     Medications: I have reviewed the patient's current medications.   Assessment/Plan: 1. Hepatocellular carcinoma ? Multiple Li-Rad 5  liver lesions noted on a CT abdomen/pelvis 01/02/2020 including a 7.1 x 5.7 cm segment 2 lesion, there are right and left hepatic lesions ? CT abdomen/pelvis 01/02/2020-cirrhosis with varices, multiple LR 5 liver lesions, 1.1 cm fluid density lesion in the pancreas head, splenomegaly, bilateral kidney stones, small amount of right perihepatic ascites ? MRI liver 02/08/2020-cirrhosis, numerous hyperenhancing liver lesions, largest lesion in segment two measuring 7.9 x 6.1 cm, lesions are consistent with multifocal hepatocellular carcinoma, the three largest lesions are categorized as LI-RADS5. 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 Y90 left hepatic lobe 03/01/2020 ? Cycle 1 nivolumab 04/08/2020 ? Cycle 2 nivolumab 04/22/2020 ? Cycle 3 nivolumab 05/13/2020 ? Cycle 4 nivolumab 05/27/2020 ? Cycle 5 nivolumab 06/11/2020 ? MRI liver 07/04/2020-cirrhosis with portal venous hypertension and ascites. Decrease in size and enhancement associated with treated left liver lesions, segment 6 lesion mildly increased in size, decreased size of segment 5 lesion ? Cycle 6 nivolumab 07/09/2020 ? Tumor board 07/17/2020- not a candidate for further Y90, continue nivolumab ? Cycle 7 nivolumab 07/23/2020 ? Cycle 8 nivolumab 08/06/2020 ? Cycle 9 nivolumab 08/20/2020 ? Cycle 10 nivolumab 09/03/2020 2. 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 Hypertension 4. BPH 5. Hyperlipidemia 6. History of heavy alcohol use 7. Remote history of  tobacco use 8. Renal insufficiency 9. Microscopic hematuria 10. 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 Esophageal mucosal changes consistent with long segment Barrett's esophagus present in the distal esophagus. Severe portal hypertensive  gastropathy found in the entire examined stomach. Small hiatal hernia. 11. Mild right hydronephrosis with a probable calculus in the proximal third of the right ureter on the MRI 03/06/2020, renal ultrasound 04/01/2020-multiple nonobstructing bilateral renal calculi  05/22/2020-laser lithotripsy, bilateral ureter stent placement 12. Atrial fibrillation-not on anticoagulation therapy 13. Dizziness-improved with discontinuation of Flomax. Not orthostatic 06/11/2020 14.  Skin rash with pruritus 08/06/2020-likely a nivolumab rash, trial of hydrocortisone cream    Disposition: [name] has completed 10 treatments with nivolumab for multifocal hepatocellular carcinoma.  He has multiple comorbid conditions including atrial fibrillation, cirrhosis with refractory ascites, and renal failure.  He has a poor performance status at present.  The bilirubin and creatinine are more elevated today.  I suspect he is dehydrated.  He will receive 500 cc of saline today.  Nivolumab will be placed on hold.  I recommended he resume diltiazem.  He will continue to hold furosemide and spironolactone.  [name] will return for a lab visit later this week.  He will be scheduled for a restaging MRI of the liver prior to an office visit next week.  He will call in the interim as needed.  I will recommend hospice care if the MRI reveals evidence of progressive hepatocellular carcinoma.  Betsy Coder, MD  09/24/2020  10:32 AM

## 2020-09-24 NOTE — Telephone Encounter (Signed)
Patient's wife [name] calls stating that she is going to take him to [facility] ED because he has not really eaten in 5 days, weak, dizzy, can't really walk.  She is hoping they will admit him.  Was seen at ED in [facility] [place] last week because they were out there for her job and had 6.5 liters of fluid drained.  I had made Dr. Benay Spice aware.

## 2020-09-26 ENCOUNTER — Telehealth: Payer: Self-pay | Admitting: Oncology

## 2020-09-26 NOTE — Telephone Encounter (Signed)
Called pt per 3/22 los - left message for patient with apt date and time

## 2020-09-27 ENCOUNTER — Inpatient Hospital Stay: Payer: Medicare Other

## 2020-09-27 ENCOUNTER — Other Ambulatory Visit: Payer: Self-pay | Admitting: *Deleted

## 2020-09-27 ENCOUNTER — Other Ambulatory Visit: Payer: Self-pay

## 2020-09-27 DIAGNOSIS — K7460 Unspecified cirrhosis of liver: Secondary | ICD-10-CM | POA: Diagnosis not present

## 2020-09-27 DIAGNOSIS — N19 Unspecified kidney failure: Secondary | ICD-10-CM | POA: Diagnosis not present

## 2020-09-27 DIAGNOSIS — C220 Liver cell carcinoma: Secondary | ICD-10-CM | POA: Diagnosis not present

## 2020-09-27 DIAGNOSIS — Z5112 Encounter for antineoplastic immunotherapy: Secondary | ICD-10-CM | POA: Diagnosis not present

## 2020-09-27 DIAGNOSIS — I4891 Unspecified atrial fibrillation: Secondary | ICD-10-CM | POA: Diagnosis not present

## 2020-09-27 DIAGNOSIS — R188 Other ascites: Secondary | ICD-10-CM | POA: Diagnosis not present

## 2020-09-27 LAB — CMP (CANCER CENTER ONLY)
ALT: 57 U/L — ABNORMAL HIGH (ref 0–44)
AST: 74 U/L — ABNORMAL HIGH (ref 15–41)
Albumin: 2.5 g/dL — ABNORMAL LOW (ref 3.5–5.0)
Alkaline Phosphatase: 150 U/L — ABNORMAL HIGH (ref 38–126)
Anion gap: 14 (ref 5–15)
BUN: 88 mg/dL — ABNORMAL HIGH (ref 8–23)
CO2: 19 mmol/L — ABNORMAL LOW (ref 22–32)
Calcium: 9.1 mg/dL (ref 8.9–10.3)
Chloride: 103 mmol/L (ref 98–111)
Creatinine: 3.34 mg/dL (ref 0.61–1.24)
GFR, Estimated: 19 mL/min — ABNORMAL LOW (ref >60)
Glucose, Bld: 147 mg/dL — ABNORMAL HIGH (ref 70–99)
Potassium: 4 mmol/L (ref 3.5–5.1)
Sodium: 136 mmol/L (ref 135–145)
Total Bilirubin: 3.8 mg/dL (ref 0.3–1.2)
Total Protein: 7.4 g/dL (ref 6.5–8.1)

## 2020-09-27 LAB — CBC WITH DIFFERENTIAL (CANCER CENTER ONLY)
Abs Immature Granulocytes: 0.04 10*3/uL (ref 0.00–0.07)
Basophils Absolute: 0.1 10*3/uL (ref 0.0–0.1)
Basophils Relative: 1 %
Eosinophils Absolute: 0.2 10*3/uL (ref 0.0–0.5)
Eosinophils Relative: 2 %
HCT: 33 % — ABNORMAL LOW (ref 39.0–52.0)
Hemoglobin: 11.2 g/dL — ABNORMAL LOW (ref 13.0–17.0)
Immature Granulocytes: 1 %
Lymphocytes Relative: 11 %
Lymphs Abs: 0.9 10*3/uL (ref 0.7–4.0)
MCH: 34.6 pg — ABNORMAL HIGH (ref 26.0–34.0)
MCHC: 33.9 g/dL (ref 30.0–36.0)
MCV: 101.9 fL — ABNORMAL HIGH (ref 80.0–100.0)
Monocytes Absolute: 0.8 10*3/uL (ref 0.1–1.0)
Monocytes Relative: 9 %
Neutro Abs: 6.3 10*3/uL (ref 1.7–7.7)
Neutrophils Relative %: 76 %
Platelet Count: 147 10*3/uL — ABNORMAL LOW (ref 150–400)
RBC: 3.24 MIL/uL — ABNORMAL LOW (ref 4.22–5.81)
RDW: 17.1 % — ABNORMAL HIGH (ref 11.5–15.5)
WBC Count: 8.2 10*3/uL (ref 4.0–10.5)
nRBC: 0 % (ref 0.0–0.2)

## 2020-10-01 ENCOUNTER — Telehealth: Payer: Self-pay | Admitting: Gastroenterology

## 2020-10-01 ENCOUNTER — Encounter: Payer: Self-pay | Admitting: *Deleted

## 2020-10-01 DIAGNOSIS — N184 Chronic kidney disease, stage 4 (severe): Secondary | ICD-10-CM | POA: Diagnosis not present

## 2020-10-01 DIAGNOSIS — K7460 Unspecified cirrhosis of liver: Secondary | ICD-10-CM

## 2020-10-01 NOTE — Telephone Encounter (Signed)
Left message for patient to call back Orders entered for radiology. Patient will be contacted directly to schedule.

## 2020-10-01 NOTE — Telephone Encounter (Signed)
Paracentesis with 3L max with albumin. Confirm with him his next appt with nephrology (hopefully soon) as they are managing his renal insufficiency and diuretics.

## 2020-10-01 NOTE — Progress Notes (Signed)
[facility]  Clinical Social Work received a referral from [facility], site for resources and additional help in the home, per request from patients wife.  CSW contacted patients wife to offer support and assess for needs.  CSW left a voicemail offering support and encouraging a return call.  Johnnye Lana, MSW, LCSW, OSW-C Clinical Social Worker [facility] [phone]

## 2020-10-01 NOTE — Telephone Encounter (Signed)
Patient notified he will be contacted directly by radiology to scheduled IR para He saw nephrology today.  They will be initiating dialysis soon.

## 2020-10-01 NOTE — Telephone Encounter (Signed)
Pt needs to schedule Paracentesis for Friday in the afternoon preferably. Pls call him.

## 2020-10-01 NOTE — Telephone Encounter (Signed)
Patient had 6.5 liters removed in [place] on 09/20/20.  Okay to schedule paracentesis for this or next week?  See recent oncology note from 3/22.

## 2020-10-02 ENCOUNTER — Encounter: Payer: Self-pay | Admitting: General Practice

## 2020-10-02 ENCOUNTER — Inpatient Hospital Stay (HOSPITAL_BASED_OUTPATIENT_CLINIC_OR_DEPARTMENT_OTHER): Payer: Medicare Other | Admitting: Oncology

## 2020-10-02 ENCOUNTER — Other Ambulatory Visit: Payer: Self-pay

## 2020-10-02 ENCOUNTER — Inpatient Hospital Stay: Payer: Medicare Other | Admitting: Nutrition

## 2020-10-02 ENCOUNTER — Inpatient Hospital Stay: Payer: Medicare Other

## 2020-10-02 ENCOUNTER — Other Ambulatory Visit: Payer: Self-pay | Admitting: Nurse Practitioner

## 2020-10-02 ENCOUNTER — Telehealth: Payer: Self-pay | Admitting: Oncology

## 2020-10-02 VITALS — BP 125/70 | HR 117 | Temp 97.8°F | Resp 20 | Ht 68.0 in | Wt 194.3 lb

## 2020-10-02 DIAGNOSIS — Z5112 Encounter for antineoplastic immunotherapy: Secondary | ICD-10-CM | POA: Diagnosis not present

## 2020-10-02 DIAGNOSIS — N19 Unspecified kidney failure: Secondary | ICD-10-CM | POA: Diagnosis not present

## 2020-10-02 DIAGNOSIS — C220 Liver cell carcinoma: Secondary | ICD-10-CM

## 2020-10-02 DIAGNOSIS — R188 Other ascites: Secondary | ICD-10-CM | POA: Diagnosis not present

## 2020-10-02 DIAGNOSIS — K7460 Unspecified cirrhosis of liver: Secondary | ICD-10-CM | POA: Diagnosis not present

## 2020-10-02 DIAGNOSIS — I4891 Unspecified atrial fibrillation: Secondary | ICD-10-CM | POA: Diagnosis not present

## 2020-10-02 MED ORDER — LORAZEPAM 0.5 MG PO TABS: 0.2500 mg | ORAL_TABLET | Freq: Every evening | ORAL | 0 refills | Status: DC | PRN

## 2020-10-02 NOTE — Progress Notes (Signed)
[place] OFFICE PROGRESS NOTE   Diagnosis: Hepatocellular carcinoma  INTERVAL HISTORY:   [name] returns as scheduled.  He is here today with his wife.  His son was present by telephone.  He continues to have altered taste.  He is taking several protein shakes daily but otherwise is not eating.  He stays in the bed or on the couch for the majority of the day. He saw Dr. Royce Macadamia and reports he is scheduled for placement of a dialysis catheter later this week.  Dr. Royce Macadamia recommends beginning hemodialysis.  The rash is partially improved..  He has developed diarrhea.  He reports having 5-6 small-volume diarrhea stools daily.  He has not tried Imodium.  Objective:  Vital signs in last 24 hours:  Blood pressure 125/70, pulse (!) 117, temperature 97.8 F (36.6 C), resp. rate 20, height 5\' 8"  (1.727 m), weight 194 lb 4.8 oz (88.1 kg), SpO2 100 %.    HEENT: The mucous membranes are moist.  No thrush or ulcers Resp: Lungs clear bilaterally Cardio: Irregular GI: Distended with ascites, no hepatosplenomegaly Vascular: No leg edema  Skin: Dry hyperpigmented rash over the trunk that appears to be fading   Lab Results:  Lab Results  Component Value Date   WBC 8.2 09/27/2020   HGB 11.2 (L) 09/27/2020   HCT 33.0 (L) 09/27/2020   MCV 101.9 (H) 09/27/2020   PLT 147 (L) 09/27/2020   NEUTROABS 6.3 09/27/2020    CMP  Lab Results  Component Value Date   NA 136 09/27/2020   K 4.0 09/27/2020   CL 103 09/27/2020   CO2 19 (L) 09/27/2020   GLUCOSE 147 (H) 09/27/2020   BUN 88 (H) 09/27/2020   CREATININE 3.34 (HH) 09/27/2020   CALCIUM 9.1 09/27/2020   PROT 7.4 09/27/2020   ALBUMIN 2.5 (L) 09/27/2020   AST 74 (H) 09/27/2020   ALT 57 (H) 09/27/2020   ALKPHOS 150 (H) 09/27/2020   BILITOT 3.8 (HH) 09/27/2020   GFRNONAA 19 (L) 09/27/2020   GFRAA 37 (L) 04/08/2020     Medications: I have reviewed the patient's current  medications.   Assessment/Plan: 1. Hepatocellular carcinoma ? Multiple Li-Rad 5 liver lesions noted on a CT abdomen/pelvis 01/02/2020 including a 7.1 x 5.7 cm segment 2 lesion, there are right and left hepatic lesions ? CT abdomen/pelvis 01/02/2020-cirrhosis with varices, multiple LR 5 liver lesions, 1.1 cm fluid density lesion in the pancreas head, splenomegaly, bilateral kidney stones, small amount of right perihepatic ascites ? MRI liver 02/08/2020-cirrhosis, numerous hyperenhancing liver lesions, largest lesion in segment two measuring 7.9 x 6.1 cm, lesions are consistent with multifocal hepatocellular carcinoma, the three largest lesions are categorized as LI-RADS5. 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 Y90 left hepatic lobe 03/01/2020 ? Cycle 1 nivolumab 04/08/2020 ? Cycle 2 nivolumab 04/22/2020 ? Cycle 3 nivolumab 05/13/2020 ? Cycle 4 nivolumab 05/27/2020 ? Cycle 5 nivolumab 06/11/2020 ? MRI liver 07/04/2020-cirrhosis with portal venous hypertension and ascites. Decrease in size and enhancement associated with treated left liver lesions, segment 6 lesion mildly increased in size, decreased size of segment 5 lesion ? Cycle 6 nivolumab 07/09/2020 ? Tumor board 07/17/2020- not a candidate for further Y90, continue nivolumab ? Cycle 7 nivolumab 07/23/2020 ? Cycle 8 nivolumab 08/06/2020 ? Cycle 9 nivolumab 08/20/2020 ? Cycle 10 nivolumab 09/03/2020 2. 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 Hypertension 4. BPH 5. Hyperlipidemia 6. History of heavy alcohol use 7. Remote history of tobacco use 8. Renal insufficiency 9. Microscopic hematuria 10. 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 Esophageal mucosal changes consistent with long segment  Barrett's esophagus present in the distal esophagus. Severe portal hypertensive gastropathy found in the entire examined stomach. Small hiatal hernia. 11. Mild right hydronephrosis with a probable calculus in the proximal third of the right ureter on the MRI 03/06/2020, renal ultrasound 04/01/2020-multiple nonobstructing bilateral renal calculi  05/22/2020-laser lithotripsy, bilateral ureter stent placement 12. Atrial fibrillation-not on anticoagulation therapy 13. Dizziness-improved with discontinuation of Flomax. Not orthostatic 06/11/2020 14.  Skin rash with pruritus 08/06/2020-likely a nivolumab rash, trial of hydrocortisone cream     Disposition: [name] has multifocal hepatocellular carcinoma.  Treatment was placed on hold when I saw him last week due to his symptoms, progressive renal failure, and elevation of the bilirubin.  He was referred for a restaging MRI of the liver prior to today's visit, but this has not been done.  The MRI has not been completed and is scheduled for 822.  [name] has a rash, diarrhea, and progressive renal failure.  It is possible some of his symptoms and lab abnormalities are related to toxicity from nivolumab.  I think it is more likely his symptoms are related to hepatorenal syndrome and progression of the hepatocellular carcinoma.  He has multiple comorbid conditions including renal failure, cirrhosis, and heart disease.  I will communicate with Dr. Royce Macadamia regarding the indication for dialysis.  It is possible the altered taste and malaise are related to uremia.  We discussed his poor overall prognosis.  He will return for an office visit and further discussion after the restaging MRI.    Betsy Coder, MD  10/02/2020  12:49 PM

## 2020-10-02 NOTE — Telephone Encounter (Signed)
Called and left detailed message for patient regarding appointments scheduled per 3/30 los

## 2020-10-02 NOTE — Progress Notes (Signed)
Infusion was canceled so patient requested phone call to speak with wife Dillon Perkins.  Nutrition follow-up completed over telephone for patient diagnosed with hepatocellular cancer.  Patient has been struggling with taste alterations and refusing to eat much.  Current weight documented as 194.3 pounds March 30 decreased from 201.3 pounds March 1.  Will drink 2-3 cartons of Ensure Plus or equivalent but is refusing to eat much else.  Wife requesting information on strategies for increasing overall calories and protein.  Educated on strategies for improving taste alterations.  Encouraged full liquids and soft foods as tolerated. Patient would require a minimum of 6 cartons of Ensure Plus or equivalent to meet calorie needs for weight maintenance.  Emailed multiple fact sheets with specific strategies for increasing calories and protein and dealing with taste alterations.  Wife was appreciative.  I have encouraged her to contact me by phone as needed for any questions.

## 2020-10-02 NOTE — Progress Notes (Signed)
[place] CSW Progress Notes  Called patient at request of Prince Rome, RN, nurse navigator.  States wife asks if there are any resources to get additional help at home.  Spoke w patient - he reports that he is fully ambulatory, able to help w inside house chores (laundry and similar) but cannot help as he used to w outside or heavy chores.  They have no outside support from friends or family.  He retired in July 2021 - was almost immediately ill and subsequently diagnosed with hepatocellular cancer.  He also has kidney disease which is complicating his nutritional status.  Provided supportive listening as patient is processing the difficulty of being ill and unable to do activities which used to bring him pleasure.  His main complaint at this point is being unable to eat enough to have adequate energy to do things he enjoys (going for a walk, being outside and similar).  He is now able to watch TV and do similar sedentary activities.  Encouraged patient to continue to discuss his nutritional issues w dietitian.  He states he can only drink 3 shakes/day - cannot eat any solid food at all.  He feels he cannot increase his intake of liquids as this impacts his kidneys and he thinks he will need a paracentesis.  He has an appointment w his nephrologist next week and hopes this will provide additional strategies for both supporting his nutrition as well as managing his kidney disease issues.    Encouraged patient and wife to reach out as needed for support/resources.   Edwyna Shell, LCSW Clinical Social Worker Phone:  [phone]

## 2020-10-03 ENCOUNTER — Telehealth: Payer: Self-pay

## 2020-10-03 NOTE — Telephone Encounter (Signed)
Patient's wife [name] left voice message requesting a new script for Lorazepam 0.5 mg tablets one q hs be sent into [facility].  The patient is taking a whole tablet not a half tablet.

## 2020-10-03 NOTE — Telephone Encounter (Signed)
Spoke with Dillon Perkins regarding script for Lorazepam, they state are getting it ready for pick up today.  I called his wife [name] to let her know.

## 2020-10-04 ENCOUNTER — Ambulatory Visit (HOSPITAL_COMMUNITY): Payer: Medicare Other

## 2020-10-04 DIAGNOSIS — Z992 Dependence on renal dialysis: Secondary | ICD-10-CM | POA: Diagnosis not present

## 2020-10-04 DIAGNOSIS — T82898A Other specified complication of vascular prosthetic devices, implants and grafts, initial encounter: Secondary | ICD-10-CM | POA: Diagnosis not present

## 2020-10-04 DIAGNOSIS — N186 End stage renal disease: Secondary | ICD-10-CM | POA: Diagnosis not present

## 2020-10-07 ENCOUNTER — Other Ambulatory Visit: Payer: Self-pay

## 2020-10-07 ENCOUNTER — Ambulatory Visit (HOSPITAL_COMMUNITY)
Admission: RE | Admit: 2020-10-07 | Discharge: 2020-10-07 | Disposition: A | Discharge status: Home / Self Care | Payer: Medicare Other | Source: Ambulatory Visit | Attending: Gastroenterology | Admitting: Gastroenterology

## 2020-10-07 DIAGNOSIS — Z66 Do not resuscitate: Secondary | ICD-10-CM | POA: Diagnosis not present

## 2020-10-07 DIAGNOSIS — K7689 Other specified diseases of liver: Secondary | ICD-10-CM | POA: Diagnosis not present

## 2020-10-07 DIAGNOSIS — R4182 Altered mental status, unspecified: Secondary | ICD-10-CM | POA: Diagnosis not present

## 2020-10-07 DIAGNOSIS — G319 Degenerative disease of nervous system, unspecified: Secondary | ICD-10-CM | POA: Diagnosis not present

## 2020-10-07 DIAGNOSIS — I6389 Other cerebral infarction: Secondary | ICD-10-CM | POA: Diagnosis not present

## 2020-10-07 DIAGNOSIS — K8020 Calculus of gallbladder without cholecystitis without obstruction: Secondary | ICD-10-CM | POA: Diagnosis not present

## 2020-10-07 DIAGNOSIS — I6782 Cerebral ischemia: Secondary | ICD-10-CM | POA: Diagnosis not present

## 2020-10-07 DIAGNOSIS — R29818 Other symptoms and signs involving the nervous system: Secondary | ICD-10-CM | POA: Diagnosis not present

## 2020-10-07 DIAGNOSIS — I120 Hypertensive chronic kidney disease with stage 5 chronic kidney disease or end stage renal disease: Secondary | ICD-10-CM | POA: Diagnosis not present

## 2020-10-07 DIAGNOSIS — A411 Sepsis due to other specified staphylococcus: Secondary | ICD-10-CM | POA: Diagnosis not present

## 2020-10-07 DIAGNOSIS — K7460 Unspecified cirrhosis of liver: Secondary | ICD-10-CM | POA: Insufficient documentation

## 2020-10-07 DIAGNOSIS — F05 Delirium due to known physiological condition: Secondary | ICD-10-CM | POA: Diagnosis not present

## 2020-10-07 DIAGNOSIS — I4891 Unspecified atrial fibrillation: Secondary | ICD-10-CM | POA: Diagnosis not present

## 2020-10-07 DIAGNOSIS — C220 Liver cell carcinoma: Secondary | ICD-10-CM | POA: Diagnosis not present

## 2020-10-07 DIAGNOSIS — N186 End stage renal disease: Secondary | ICD-10-CM | POA: Diagnosis not present

## 2020-10-07 DIAGNOSIS — R188 Other ascites: Secondary | ICD-10-CM

## 2020-10-07 DIAGNOSIS — K659 Peritonitis, unspecified: Secondary | ICD-10-CM | POA: Diagnosis not present

## 2020-10-07 DIAGNOSIS — G9341 Metabolic encephalopathy: Secondary | ICD-10-CM | POA: Diagnosis not present

## 2020-10-07 DIAGNOSIS — Z20822 Contact with and (suspected) exposure to covid-19: Secondary | ICD-10-CM | POA: Diagnosis not present

## 2020-10-07 HISTORY — PX: IR PARACENTESIS: IMG2679

## 2020-10-07 MED ORDER — LIDOCAINE HCL 1 % IJ SOLN
INTRAMUSCULAR | Status: AC
  Filled 2020-10-07: qty 20

## 2020-10-07 MED ORDER — LIDOCAINE HCL (PF) 1 % IJ SOLN
INTRAMUSCULAR | Status: DC | PRN
  Administered 2020-10-07: 10 mL

## 2020-10-07 NOTE — Procedures (Signed)
PROCEDURE SUMMARY:  Successful US guided paracentesis from right lateral abdomen.  Yielded 3.0 liters of yellow fluid.  No immediate complications.  Pt tolerated well.   Specimen was not sent for labs.  EBL < 5mL  Docia Barrier PA-C 10/07/2020 1:15 PM

## 2020-10-08 ENCOUNTER — Ambulatory Visit: Payer: Medicare Other | Admitting: Oncology

## 2020-10-08 ENCOUNTER — Other Ambulatory Visit: Payer: Medicare Other

## 2020-10-08 ENCOUNTER — Ambulatory Visit: Payer: Medicare Other

## 2020-10-08 ENCOUNTER — Encounter: Payer: Self-pay | Admitting: *Deleted

## 2020-10-08 DIAGNOSIS — I129 Hypertensive chronic kidney disease with stage 1 through stage 4 chronic kidney disease, or unspecified chronic kidney disease: Secondary | ICD-10-CM | POA: Diagnosis not present

## 2020-10-08 DIAGNOSIS — D631 Anemia in chronic kidney disease: Secondary | ICD-10-CM | POA: Diagnosis not present

## 2020-10-08 DIAGNOSIS — Z992 Dependence on renal dialysis: Secondary | ICD-10-CM | POA: Diagnosis not present

## 2020-10-08 DIAGNOSIS — N186 End stage renal disease: Secondary | ICD-10-CM | POA: Diagnosis not present

## 2020-10-08 HISTORY — DX: End stage renal disease: N18.6

## 2020-10-08 HISTORY — DX: End stage renal disease: Z99.2

## 2020-10-08 NOTE — Progress Notes (Signed)
[place] Work  Clinical Social Work received phone call from patients wife requesting information on additional help in the home.  Patients wife stated patient is beginning to struggle with some ADLS, and she is planning to get a walker and wheelchair today to use in the home.  CSW and patients wife discussed home health vs home care.  CSW encouraged patients wife to contact patients supplemental insurance policy to determine if home care benefits are available.  Patinets wife stated she plans to call patients [employer] policy today.  CSW also encouraged patients wife to explore physical therapy through home health with treatment team.  Patients wife was appreciative of CSW contact.  CSW encouraged patients wife to call with additional questions or concerns.         Johnnye Lana, MSW, LCSW, OSW-C Clinical Social Worker [facility] [phone]

## 2020-10-09 ENCOUNTER — Emergency Department (HOSPITAL_COMMUNITY): Payer: Medicare Other

## 2020-10-09 ENCOUNTER — Inpatient Hospital Stay (HOSPITAL_COMMUNITY)
Admission: EM | Admit: 2020-10-09 | Discharge: 2020-10-14 | DRG: 871 | Disposition: A | Discharge status: Home Health | Payer: Medicare Other | Attending: Internal Medicine | Admitting: Internal Medicine

## 2020-10-09 ENCOUNTER — Other Ambulatory Visit: Payer: Self-pay

## 2020-10-09 ENCOUNTER — Telehealth: Payer: Self-pay

## 2020-10-09 DIAGNOSIS — N400 Enlarged prostate without lower urinary tract symptoms: Secondary | ICD-10-CM | POA: Diagnosis present

## 2020-10-09 DIAGNOSIS — K7682 Hepatic encephalopathy: Secondary | ICD-10-CM

## 2020-10-09 DIAGNOSIS — N250 Renal osteodystrophy: Secondary | ICD-10-CM | POA: Diagnosis not present

## 2020-10-09 DIAGNOSIS — K8020 Calculus of gallbladder without cholecystitis without obstruction: Secondary | ICD-10-CM | POA: Diagnosis not present

## 2020-10-09 DIAGNOSIS — I4891 Unspecified atrial fibrillation: Secondary | ICD-10-CM

## 2020-10-09 DIAGNOSIS — I8390 Asymptomatic varicose veins of unspecified lower extremity: Secondary | ICD-10-CM | POA: Diagnosis not present

## 2020-10-09 DIAGNOSIS — E785 Hyperlipidemia, unspecified: Secondary | ICD-10-CM | POA: Diagnosis not present

## 2020-10-09 DIAGNOSIS — I499 Cardiac arrhythmia, unspecified: Secondary | ICD-10-CM | POA: Diagnosis not present

## 2020-10-09 DIAGNOSIS — E871 Hypo-osmolality and hyponatremia: Secondary | ICD-10-CM | POA: Diagnosis present

## 2020-10-09 DIAGNOSIS — R21 Rash and other nonspecific skin eruption: Secondary | ICD-10-CM | POA: Diagnosis present

## 2020-10-09 DIAGNOSIS — G9341 Metabolic encephalopathy: Secondary | ICD-10-CM | POA: Diagnosis not present

## 2020-10-09 DIAGNOSIS — K922 Gastrointestinal hemorrhage, unspecified: Secondary | ICD-10-CM | POA: Diagnosis not present

## 2020-10-09 DIAGNOSIS — Z87891 Personal history of nicotine dependence: Secondary | ICD-10-CM

## 2020-10-09 DIAGNOSIS — E440 Moderate protein-calorie malnutrition: Secondary | ICD-10-CM | POA: Diagnosis present

## 2020-10-09 DIAGNOSIS — I517 Cardiomegaly: Secondary | ICD-10-CM | POA: Diagnosis not present

## 2020-10-09 DIAGNOSIS — D631 Anemia in chronic kidney disease: Secondary | ICD-10-CM | POA: Diagnosis present

## 2020-10-09 DIAGNOSIS — K7290 Hepatic failure, unspecified without coma: Secondary | ICD-10-CM

## 2020-10-09 DIAGNOSIS — N185 Chronic kidney disease, stage 5: Secondary | ICD-10-CM | POA: Diagnosis not present

## 2020-10-09 DIAGNOSIS — K449 Diaphragmatic hernia without obstruction or gangrene: Secondary | ICD-10-CM | POA: Diagnosis present

## 2020-10-09 DIAGNOSIS — G4751 Confusional arousals: Secondary | ICD-10-CM | POA: Diagnosis not present

## 2020-10-09 DIAGNOSIS — I4819 Other persistent atrial fibrillation: Secondary | ICD-10-CM | POA: Diagnosis present

## 2020-10-09 DIAGNOSIS — R404 Transient alteration of awareness: Secondary | ICD-10-CM | POA: Diagnosis not present

## 2020-10-09 DIAGNOSIS — Z992 Dependence on renal dialysis: Secondary | ICD-10-CM | POA: Diagnosis not present

## 2020-10-09 DIAGNOSIS — Z6827 Body mass index (BMI) 27.0-27.9, adult: Secondary | ICD-10-CM

## 2020-10-09 DIAGNOSIS — N186 End stage renal disease: Secondary | ICD-10-CM | POA: Diagnosis not present

## 2020-10-09 DIAGNOSIS — K7031 Alcoholic cirrhosis of liver with ascites: Secondary | ICD-10-CM

## 2020-10-09 DIAGNOSIS — I8510 Secondary esophageal varices without bleeding: Secondary | ICD-10-CM | POA: Diagnosis present

## 2020-10-09 DIAGNOSIS — A419 Sepsis, unspecified organism: Secondary | ICD-10-CM | POA: Diagnosis not present

## 2020-10-09 DIAGNOSIS — R410 Disorientation, unspecified: Secondary | ICD-10-CM | POA: Diagnosis present

## 2020-10-09 DIAGNOSIS — K7689 Other specified diseases of liver: Secondary | ICD-10-CM | POA: Diagnosis not present

## 2020-10-09 DIAGNOSIS — Z9114 Patient's other noncompliance with medication regimen: Secondary | ICD-10-CM

## 2020-10-09 DIAGNOSIS — A411 Sepsis due to other specified staphylococcus: Principal | ICD-10-CM | POA: Diagnosis present

## 2020-10-09 DIAGNOSIS — R8569 Abnormal cytological findings in specimens from other digestive organs and abdominal cavity: Secondary | ICD-10-CM | POA: Diagnosis not present

## 2020-10-09 DIAGNOSIS — C220 Liver cell carcinoma: Secondary | ICD-10-CM | POA: Diagnosis not present

## 2020-10-09 DIAGNOSIS — E874 Mixed disorder of acid-base balance: Secondary | ICD-10-CM | POA: Diagnosis present

## 2020-10-09 DIAGNOSIS — K767 Hepatorenal syndrome: Secondary | ICD-10-CM | POA: Diagnosis not present

## 2020-10-09 DIAGNOSIS — I120 Hypertensive chronic kidney disease with stage 5 chronic kidney disease or end stage renal disease: Secondary | ICD-10-CM | POA: Diagnosis present

## 2020-10-09 DIAGNOSIS — D696 Thrombocytopenia, unspecified: Secondary | ICD-10-CM | POA: Diagnosis present

## 2020-10-09 DIAGNOSIS — K659 Peritonitis, unspecified: Secondary | ICD-10-CM | POA: Diagnosis not present

## 2020-10-09 DIAGNOSIS — R188 Other ascites: Secondary | ICD-10-CM | POA: Diagnosis not present

## 2020-10-09 DIAGNOSIS — R3129 Other microscopic hematuria: Secondary | ICD-10-CM | POA: Diagnosis not present

## 2020-10-09 DIAGNOSIS — K7040 Alcoholic hepatic failure without coma: Secondary | ICD-10-CM | POA: Diagnosis present

## 2020-10-09 DIAGNOSIS — Z781 Physical restraint status: Secondary | ICD-10-CM

## 2020-10-09 DIAGNOSIS — K766 Portal hypertension: Secondary | ICD-10-CM | POA: Diagnosis present

## 2020-10-09 DIAGNOSIS — G319 Degenerative disease of nervous system, unspecified: Secondary | ICD-10-CM | POA: Diagnosis not present

## 2020-10-09 DIAGNOSIS — R4182 Altered mental status, unspecified: Secondary | ICD-10-CM

## 2020-10-09 DIAGNOSIS — D689 Coagulation defect, unspecified: Secondary | ICD-10-CM | POA: Diagnosis present

## 2020-10-09 DIAGNOSIS — Z66 Do not resuscitate: Secondary | ICD-10-CM | POA: Diagnosis not present

## 2020-10-09 DIAGNOSIS — R161 Splenomegaly, not elsewhere classified: Secondary | ICD-10-CM | POA: Diagnosis present

## 2020-10-09 DIAGNOSIS — K7460 Unspecified cirrhosis of liver: Secondary | ICD-10-CM | POA: Diagnosis not present

## 2020-10-09 DIAGNOSIS — I959 Hypotension, unspecified: Secondary | ICD-10-CM | POA: Diagnosis not present

## 2020-10-09 DIAGNOSIS — I8500 Esophageal varices without bleeding: Secondary | ICD-10-CM

## 2020-10-09 DIAGNOSIS — K219 Gastro-esophageal reflux disease without esophagitis: Secondary | ICD-10-CM | POA: Diagnosis present

## 2020-10-09 DIAGNOSIS — I9589 Other hypotension: Secondary | ICD-10-CM | POA: Diagnosis present

## 2020-10-09 DIAGNOSIS — L299 Pruritus, unspecified: Secondary | ICD-10-CM | POA: Diagnosis present

## 2020-10-09 DIAGNOSIS — R29818 Other symptoms and signs involving the nervous system: Secondary | ICD-10-CM | POA: Diagnosis not present

## 2020-10-09 DIAGNOSIS — F05 Delirium due to known physiological condition: Secondary | ICD-10-CM | POA: Diagnosis present

## 2020-10-09 DIAGNOSIS — K7469 Other cirrhosis of liver: Secondary | ICD-10-CM | POA: Diagnosis not present

## 2020-10-09 DIAGNOSIS — K3189 Other diseases of stomach and duodenum: Secondary | ICD-10-CM | POA: Diagnosis present

## 2020-10-09 DIAGNOSIS — Z515 Encounter for palliative care: Secondary | ICD-10-CM | POA: Diagnosis not present

## 2020-10-09 DIAGNOSIS — N289 Disorder of kidney and ureter, unspecified: Secondary | ICD-10-CM | POA: Diagnosis not present

## 2020-10-09 DIAGNOSIS — Z79899 Other long term (current) drug therapy: Secondary | ICD-10-CM

## 2020-10-09 DIAGNOSIS — I6389 Other cerebral infarction: Secondary | ICD-10-CM | POA: Diagnosis not present

## 2020-10-09 DIAGNOSIS — I10 Essential (primary) hypertension: Secondary | ICD-10-CM | POA: Diagnosis not present

## 2020-10-09 DIAGNOSIS — N2581 Secondary hyperparathyroidism of renal origin: Secondary | ICD-10-CM | POA: Diagnosis present

## 2020-10-09 DIAGNOSIS — Z7189 Other specified counseling: Secondary | ICD-10-CM | POA: Diagnosis not present

## 2020-10-09 DIAGNOSIS — Z20822 Contact with and (suspected) exposure to covid-19: Secondary | ICD-10-CM | POA: Diagnosis present

## 2020-10-09 DIAGNOSIS — Z803 Family history of malignant neoplasm of breast: Secondary | ICD-10-CM

## 2020-10-09 DIAGNOSIS — R54 Age-related physical debility: Secondary | ICD-10-CM | POA: Diagnosis present

## 2020-10-09 DIAGNOSIS — I6782 Cerebral ischemia: Secondary | ICD-10-CM | POA: Diagnosis not present

## 2020-10-09 DIAGNOSIS — R7989 Other specified abnormal findings of blood chemistry: Secondary | ICD-10-CM | POA: Diagnosis not present

## 2020-10-09 DIAGNOSIS — N1330 Unspecified hydronephrosis: Secondary | ICD-10-CM | POA: Diagnosis not present

## 2020-10-09 DIAGNOSIS — G9340 Encephalopathy, unspecified: Secondary | ICD-10-CM

## 2020-10-09 DIAGNOSIS — I953 Hypotension of hemodialysis: Secondary | ICD-10-CM | POA: Diagnosis not present

## 2020-10-09 DIAGNOSIS — Z9221 Personal history of antineoplastic chemotherapy: Secondary | ICD-10-CM

## 2020-10-09 DIAGNOSIS — Z8249 Family history of ischemic heart disease and other diseases of the circulatory system: Secondary | ICD-10-CM

## 2020-10-09 DIAGNOSIS — R600 Localized edema: Secondary | ICD-10-CM | POA: Diagnosis present

## 2020-10-09 DIAGNOSIS — R197 Diarrhea, unspecified: Secondary | ICD-10-CM | POA: Diagnosis not present

## 2020-10-09 DIAGNOSIS — R319 Hematuria, unspecified: Secondary | ICD-10-CM | POA: Diagnosis not present

## 2020-10-09 DIAGNOSIS — Z888 Allergy status to other drugs, medicaments and biological substances status: Secondary | ICD-10-CM

## 2020-10-09 DIAGNOSIS — R000 Tachycardia, unspecified: Secondary | ICD-10-CM | POA: Diagnosis not present

## 2020-10-09 DIAGNOSIS — K2270 Barrett's esophagus without dysplasia: Secondary | ICD-10-CM | POA: Diagnosis present

## 2020-10-09 LAB — CBC WITH DIFFERENTIAL/PLATELET
Abs Immature Granulocytes: 0.03 10*3/uL (ref 0.00–0.07)
Basophils Absolute: 0 10*3/uL (ref 0.0–0.1)
Basophils Relative: 0 %
Eosinophils Absolute: 0 10*3/uL (ref 0.0–0.5)
Eosinophils Relative: 0 %
HCT: 30.1 % — ABNORMAL LOW (ref 39.0–52.0)
Hemoglobin: 10.1 g/dL — ABNORMAL LOW (ref 13.0–17.0)
Immature Granulocytes: 0 %
Lymphocytes Relative: 7 %
Lymphs Abs: 0.5 10*3/uL — ABNORMAL LOW (ref 0.7–4.0)
MCH: 35.7 pg — ABNORMAL HIGH (ref 26.0–34.0)
MCHC: 33.6 g/dL (ref 30.0–36.0)
MCV: 106.4 fL — ABNORMAL HIGH (ref 80.0–100.0)
Monocytes Absolute: 0.4 10*3/uL (ref 0.1–1.0)
Monocytes Relative: 6 %
Neutro Abs: 6.2 10*3/uL (ref 1.7–7.7)
Neutrophils Relative %: 87 %
Platelets: 106 10*3/uL — ABNORMAL LOW (ref 150–400)
RBC: 2.83 MIL/uL — ABNORMAL LOW (ref 4.22–5.81)
RDW: 17.5 % — ABNORMAL HIGH (ref 11.5–15.5)
WBC: 7.2 10*3/uL (ref 4.0–10.5)
nRBC: 0 % (ref 0.0–0.2)

## 2020-10-09 LAB — COMPREHENSIVE METABOLIC PANEL
ALT: 46 U/L — ABNORMAL HIGH (ref 0–44)
AST: 61 U/L — ABNORMAL HIGH (ref 15–41)
Albumin: 2.2 g/dL — ABNORMAL LOW (ref 3.5–5.0)
Alkaline Phosphatase: 106 U/L (ref 38–126)
Anion gap: 10 (ref 5–15)
BUN: 76 mg/dL — ABNORMAL HIGH (ref 8–23)
CO2: 21 mmol/L — ABNORMAL LOW (ref 22–32)
Calcium: 8.7 mg/dL — ABNORMAL LOW (ref 8.9–10.3)
Chloride: 102 mmol/L (ref 98–111)
Creatinine, Ser: 3.35 mg/dL — ABNORMAL HIGH (ref 0.61–1.24)
GFR, Estimated: 19 mL/min — ABNORMAL LOW (ref >60)
Glucose, Bld: 120 mg/dL — ABNORMAL HIGH (ref 70–99)
Potassium: 4.9 mmol/L (ref 3.5–5.1)
Sodium: 133 mmol/L — ABNORMAL LOW (ref 135–145)
Total Bilirubin: 4.9 mg/dL — ABNORMAL HIGH (ref 0.3–1.2)
Total Protein: 7.2 g/dL (ref 6.5–8.1)

## 2020-10-09 LAB — RENAL FUNCTION PANEL
Albumin: 2.1 g/dL — ABNORMAL LOW (ref 3.5–5.0)
Anion gap: 12 (ref 5–15)
BUN: 74 mg/dL — ABNORMAL HIGH (ref 8–23)
CO2: 21 mmol/L — ABNORMAL LOW (ref 22–32)
Calcium: 8.7 mg/dL — ABNORMAL LOW (ref 8.9–10.3)
Chloride: 100 mmol/L (ref 98–111)
Creatinine, Ser: 3.21 mg/dL — ABNORMAL HIGH (ref 0.61–1.24)
GFR, Estimated: 20 mL/min — ABNORMAL LOW (ref >60)
Glucose, Bld: 113 mg/dL — ABNORMAL HIGH (ref 70–99)
Phosphorus: 4.9 mg/dL — ABNORMAL HIGH (ref 2.5–4.6)
Potassium: 4.8 mmol/L (ref 3.5–5.1)
Sodium: 133 mmol/L — ABNORMAL LOW (ref 135–145)

## 2020-10-09 LAB — URINALYSIS, ROUTINE W REFLEX MICROSCOPIC
Bilirubin Urine: NEGATIVE
Glucose, UA: NEGATIVE mg/dL
Hgb urine dipstick: NEGATIVE
Ketones, ur: NEGATIVE mg/dL
Nitrite: NEGATIVE
Protein, ur: NEGATIVE mg/dL
Specific Gravity, Urine: 1.016 (ref 1.005–1.030)
pH: 5 (ref 5.0–8.0)

## 2020-10-09 LAB — I-STAT VENOUS BLOOD GAS, ED
Acid-base deficit: 2 mmol/L (ref 0.0–2.0)
Bicarbonate: 21.1 mmol/L (ref 20.0–28.0)
Calcium, Ion: 1.11 mmol/L — ABNORMAL LOW (ref 1.15–1.40)
HCT: 27 % — ABNORMAL LOW (ref 39.0–52.0)
Hemoglobin: 9.2 g/dL — ABNORMAL LOW (ref 13.0–17.0)
O2 Saturation: 97 %
Potassium: 5 mmol/L (ref 3.5–5.1)
Sodium: 136 mmol/L (ref 135–145)
TCO2: 22 mmol/L (ref 22–32)
pCO2, Ven: 28.7 mmHg — ABNORMAL LOW (ref 44.0–60.0)
pH, Ven: 7.474 — ABNORMAL HIGH (ref 7.250–7.430)
pO2, Ven: 83 mmHg — ABNORMAL HIGH (ref 32.0–45.0)

## 2020-10-09 LAB — LACTIC ACID, PLASMA
Lactic Acid, Venous: 4 mmol/L (ref 0.5–1.9)
Lactic Acid, Venous: 7.9 mmol/L (ref 0.5–1.9)
Lactic Acid, Venous: 3 mmol/L (ref 0.5–1.9)

## 2020-10-09 LAB — RAPID URINE DRUG SCREEN, HOSP PERFORMED
Amphetamines: NOT DETECTED
Barbiturates: NOT DETECTED
Benzodiazepines: POSITIVE — AB
Cocaine: NOT DETECTED
Opiates: NOT DETECTED
Tetrahydrocannabinol: NOT DETECTED

## 2020-10-09 LAB — AMMONIA
Ammonia: 190 umol/L — ABNORMAL HIGH (ref 9–35)
Ammonia: 86 umol/L — ABNORMAL HIGH (ref 9–35)

## 2020-10-09 LAB — CBG MONITORING, ED: Glucose-Capillary: 110 mg/dL — ABNORMAL HIGH (ref 70–99)

## 2020-10-09 LAB — CBC
HCT: 28.7 % — ABNORMAL LOW (ref 39.0–52.0)
Hemoglobin: 9.9 g/dL — ABNORMAL LOW (ref 13.0–17.0)
MCH: 36.1 pg — ABNORMAL HIGH (ref 26.0–34.0)
MCHC: 34.5 g/dL (ref 30.0–36.0)
MCV: 104.7 fL — ABNORMAL HIGH (ref 80.0–100.0)
Platelets: 99 10*3/uL — ABNORMAL LOW (ref 150–400)
RBC: 2.74 MIL/uL — ABNORMAL LOW (ref 4.22–5.81)
RDW: 17.5 % — ABNORMAL HIGH (ref 11.5–15.5)
WBC: 7.1 10*3/uL (ref 4.0–10.5)
nRBC: 0 % (ref 0.0–0.2)

## 2020-10-09 LAB — RESP PANEL BY RT-PCR (FLU A&B, COVID) ARPGX2
Influenza A by PCR: NEGATIVE
Influenza B by PCR: NEGATIVE
SARS Coronavirus 2 by RT PCR: NEGATIVE

## 2020-10-09 LAB — ETHANOL: Alcohol, Ethyl (B): <10 mg/dL (ref <10)

## 2020-10-09 LAB — PROTIME-INR
INR: 1.7 — ABNORMAL HIGH (ref 0.8–1.2)
Prothrombin Time: 19.4 seconds — ABNORMAL HIGH (ref 11.4–15.2)

## 2020-10-09 LAB — LIPASE, BLOOD: Lipase: 68 U/L — ABNORMAL HIGH (ref 11–51)

## 2020-10-09 LAB — APTT: aPTT: 41 seconds — ABNORMAL HIGH (ref 24–36)

## 2020-10-09 MED ORDER — LACTATED RINGERS IV SOLN: INTRAVENOUS | Status: DC

## 2020-10-09 MED ORDER — LORAZEPAM 2 MG/ML IJ SOLN
1.0000 mg | Freq: Once | INTRAMUSCULAR | Status: AC
  Administered 2020-10-09: 1 mg via INTRAVENOUS
  Filled 2020-10-09: qty 1

## 2020-10-09 MED ORDER — LACTATED RINGERS IV BOLUS (SEPSIS)
1000.0000 mL | Freq: Once | INTRAVENOUS | Status: AC
  Administered 2020-10-09: 1000 mL via INTRAVENOUS

## 2020-10-09 MED ORDER — ONDANSETRON HCL 4 MG/2ML IJ SOLN: 4.0000 mg | Freq: Four times a day (QID) | INTRAMUSCULAR | Status: DC | PRN

## 2020-10-09 MED ORDER — PHYTONADIONE 5 MG PO TABS
5.0000 mg | ORAL_TABLET | Freq: Once | ORAL | Status: DC
  Filled 2020-10-09: qty 1

## 2020-10-09 MED ORDER — LACTATED RINGERS IV BOLUS (SEPSIS)
500.0000 mL | Freq: Once | INTRAVENOUS | Status: AC
  Administered 2020-10-09: 500 mL via INTRAVENOUS

## 2020-10-09 MED ORDER — VANCOMYCIN HCL 1000 MG/200ML IV SOLN: 1000.0000 mg | Freq: Once | INTRAVENOUS | Status: DC

## 2020-10-09 MED ORDER — LACTULOSE ENEMA
300.0000 mL | Freq: Two times a day (BID) | ORAL | Status: AC
  Administered 2020-10-09 – 2020-10-11 (×4): 300 mL via RECTAL
  Filled 2020-10-09 (×4): qty 300

## 2020-10-09 MED ORDER — PENTAFLUOROPROP-TETRAFLUOROETH EX AERO
1.0000 "application " | INHALATION_SPRAY | CUTANEOUS | Status: DC | PRN
  Filled 2020-10-09: qty 116

## 2020-10-09 MED ORDER — LACTULOSE 10 GM/15ML PO SOLN
30.0000 g | Freq: Once | ORAL | Status: DC
  Filled 2020-10-09: qty 45

## 2020-10-09 MED ORDER — LIDOCAINE HCL (PF) 1 % IJ SOLN: 5.0000 mL | INTRAMUSCULAR | Status: DC | PRN

## 2020-10-09 MED ORDER — LORAZEPAM 0.5 MG PO TABS: 0.2500 mg | ORAL_TABLET | Freq: Every evening | ORAL | Status: DC | PRN

## 2020-10-09 MED ORDER — CHLORHEXIDINE GLUCONATE CLOTH 2 % EX PADS
6.0000 | MEDICATED_PAD | Freq: Every day | CUTANEOUS | Status: DC
  Administered 2020-10-10: 6 via TOPICAL

## 2020-10-09 MED ORDER — LACTATED RINGERS IV BOLUS (SEPSIS)
1000.0000 mL | Freq: Once | INTRAVENOUS | Status: AC
Start: 2020-10-09 — End: 2020-10-09
  Administered 2020-10-09: 1000 mL via INTRAVENOUS

## 2020-10-09 MED ORDER — MIDODRINE HCL 5 MG PO TABS
10.0000 mg | ORAL_TABLET | Freq: Three times a day (TID) | ORAL | Status: DC
  Administered 2020-10-10 – 2020-10-14 (×12): 10 mg via ORAL
  Filled 2020-10-09 (×12): qty 2

## 2020-10-09 MED ORDER — METRONIDAZOLE IN NACL 5-0.79 MG/ML-% IV SOLN
500.0000 mg | Freq: Once | INTRAVENOUS | Status: AC
  Administered 2020-10-09: 500 mg via INTRAVENOUS
  Filled 2020-10-09: qty 100

## 2020-10-09 MED ORDER — SODIUM CHLORIDE 0.9 % IV SOLN: 100.0000 mL | INTRAVENOUS | Status: DC | PRN

## 2020-10-09 MED ORDER — LIDOCAINE-PRILOCAINE 2.5-2.5 % EX CREA
1.0000 "application " | TOPICAL_CREAM | CUTANEOUS | Status: DC | PRN
  Filled 2020-10-09: qty 5

## 2020-10-09 MED ORDER — ONDANSETRON HCL 4 MG PO TABS: 4.0000 mg | ORAL_TABLET | Freq: Four times a day (QID) | ORAL | Status: DC | PRN

## 2020-10-09 MED ORDER — ONDANSETRON HCL 4 MG/2ML IJ SOLN
4.0000 mg | Freq: Once | INTRAMUSCULAR | Status: AC
  Administered 2020-10-09: 4 mg via INTRAVENOUS
  Filled 2020-10-09: qty 2

## 2020-10-09 MED ORDER — HYDROXYZINE HCL 10 MG PO TABS
10.0000 mg | ORAL_TABLET | Freq: Three times a day (TID) | ORAL | Status: DC | PRN
  Filled 2020-10-09: qty 1

## 2020-10-09 MED ORDER — VANCOMYCIN HCL 2000 MG/400ML IV SOLN
2000.0000 mg | Freq: Once | INTRAVENOUS | Status: AC
  Administered 2020-10-09: 2000 mg via INTRAVENOUS
  Filled 2020-10-09: qty 400

## 2020-10-09 MED ORDER — SODIUM CHLORIDE 0.9 % IV SOLN
2.0000 g | Freq: Once | INTRAVENOUS | Status: AC
  Administered 2020-10-09: 2 g via INTRAVENOUS
  Filled 2020-10-09: qty 2

## 2020-10-09 MED ORDER — SODIUM CHLORIDE 0.9 % IV SOLN
2.0000 g | INTRAVENOUS | Status: DC
  Administered 2020-10-10: 2 g via INTRAVENOUS
  Filled 2020-10-09: qty 2

## 2020-10-09 MED ORDER — DILTIAZEM HCL-DEXTROSE 125-5 MG/125ML-% IV SOLN (PREMIX)
5.0000 mg/h | INTRAVENOUS | Status: DC
  Administered 2020-10-09: 5 mg/h via INTRAVENOUS
  Administered 2020-10-09: 10 mg/h via INTRAVENOUS
  Filled 2020-10-09 (×2): qty 125

## 2020-10-09 MED ORDER — HEPARIN SODIUM (PORCINE) 1000 UNIT/ML DIALYSIS
1000.0000 [IU] | INTRAMUSCULAR | Status: DC | PRN
  Administered 2020-10-12: 3200 [IU] via INTRAVENOUS_CENTRAL
  Filled 2020-10-09 (×2): qty 1

## 2020-10-09 MED ORDER — HALOPERIDOL LACTATE 5 MG/ML IJ SOLN
5.0000 mg | Freq: Four times a day (QID) | INTRAMUSCULAR | Status: DC | PRN
  Filled 2020-10-09: qty 1

## 2020-10-09 MED ORDER — LACTULOSE 10 GM/15ML PO SOLN
30.0000 g | Freq: Three times a day (TID) | ORAL | Status: DC
  Filled 2020-10-09 (×2): qty 45

## 2020-10-09 MED ORDER — OXAZEPAM 15 MG PO CAPS: 15.0000 mg | ORAL_CAPSULE | Freq: Three times a day (TID) | ORAL | Status: DC | PRN

## 2020-10-09 MED ORDER — LACTULOSE ENEMA: 300.0000 mL | Freq: Two times a day (BID) | ORAL | Status: DC

## 2020-10-09 MED ORDER — RIFAXIMIN 550 MG PO TABS
550.0000 mg | ORAL_TABLET | Freq: Two times a day (BID) | ORAL | Status: DC
  Administered 2020-10-10 – 2020-10-14 (×9): 550 mg via ORAL
  Filled 2020-10-09 (×11): qty 1

## 2020-10-09 MED ORDER — ALBUMIN HUMAN 25 % IV SOLN
25.0000 g | Freq: Four times a day (QID) | INTRAVENOUS | Status: DC
  Administered 2020-10-09 – 2020-10-10 (×3): 25 g via INTRAVENOUS
  Filled 2020-10-09 (×3): qty 100

## 2020-10-09 MED ORDER — ALTEPLASE 2 MG IJ SOLR
2.0000 mg | Freq: Once | INTRAMUSCULAR | Status: DC | PRN
  Filled 2020-10-09: qty 2

## 2020-10-09 MED ORDER — VANCOMYCIN HCL 1250 MG/250ML IV SOLN: 1250.0000 mg | INTRAVENOUS | Status: DC

## 2020-10-09 NOTE — Progress Notes (Signed)
Pharmacy Antibiotic Note  Dillon Perkins is a 70 y.o. male admitted on 10/09/2020 with sepsis.  Pharmacy has been consulted for vancomycin/cefepime dosing. SCr 3.34 on admit (recent baseline ~2.7-3.2).  Plan: Cefepime 2g IV q24h Vancomycin 2g IV x 1; then 1250mg  IV q48h. Goal AUC 400-550. Expected AUC: 475 SCr used: 3.34 Flagyl 500mg  IV x 1 per EDP - f/u if to continue Monitor clinical progress, c/s, renal function F/u de-escalation plan/LOT, vancomycin levels as indicated   Height: 5\' 8"  (172.7 cm) Weight: 88.3 kg (194 lb 11.2 oz) IBW/kg (Calculated) : 68.4  Temp (24hrs), Avg:98 F (36.7 C), Min:98 F (36.7 C), Max:98 F (36.7 C)  Recent Labs  Lab 10/09/20 0924  LATICACIDVEN 4.0*    Estimated Creatinine Clearance: 21.9 mL/min (A) (by C-G formula based on SCr of 3.34 mg/dL [facility])).    Allergies  Allergen Reactions  . Losartan Potassium Other (See Comments)  . Other Other (See Comments)    Antimicrobials this admission: 4/6 vancomycin >>  4/6 cefepime >>  4/6 flagyl x 1  Dose adjustments this admission:   Microbiology results:   Arturo Morton, PharmD, BCPS Please check AMION for all [place] contact numbers Clinical Pharmacist 10/09/2020 10:44 AM

## 2020-10-09 NOTE — ED Notes (Signed)
Per Dr. Rogene [place] titrate cardizem drip up 2.5

## 2020-10-09 NOTE — H&P (Addendum)
History and Physical    Dillon Perkins ZDG:[id number] DOB: 06-May-1951 DOA: 10/09/2020  PCP: Antony Contras, MD (Confirm with patient/family/NH records and if not entered, this has to be entered at [facility] point of entry) Patient coming from: [facility]  I have personally briefly reviewed patient's old medical records in [place]  Chief Complaint: AMS  HPI: Dillon Perkins is a 70 y.o. male with medical history significant of hepatocellular carcinoma status post radio embolization, immunotherapy with Nivolumab x10 cycles last cycle was September 03, 9516, alcoholic cirrhosis with portal hypertension, PAF noncompliant with Cardizem, CKD stage V starting dialysis, presented with altered mentations.  Wife reported that patient has become lethargic, poor oral appetite for 1+ month.  Since last week, patient " has been sleepy all day", no fever, no cough no diarrhea.  This morning, patient woke up confused, moaning and disoriented.  Patient also received first hemo-dialysis yesterday.  Patient was diagnosed with alcoholic cirrhosis and has been following with gastroenterologist and undergoing periodic paracentesis and the last paracentesis was 2 days ago of 3 L removed.  It does not appear the patient been taking any cirrhosis medications.  ED Course: And was found to be in rapid A. fib, blood pressure on the lower side responded to LR bolus 500.  Ammonia 160.  Lactic acid 4.0, bilirubin 4.9, ALT AST elevated.  WBC 7.2.  CT head negative for acute findings.  Chest x-ray negative for acute infiltration.  VBG showed respiratory alkalosis.  Review of Systems: Unable to perform, patient confused and agitated.  Past Medical History:  Diagnosis Date  . Alcoholic cirrhosis ([place])   . Atrial fibrillation ([place])   . GERD (gastroesophageal reflux disease)   . Hepatocellular carcinoma ([place])   . History of chemotherapy taking immunotherapy   last dose 05-13-2020  . History of kidney stones   . Hyperlipidemia    . Hypertension     Past Surgical History:  Procedure Laterality Date  . APPENDECTOMY  yrs ago  . CYSTOSCOPY/URETEROSCOPY/HOLMIUM LASER/STENT PLACEMENT Bilateral 05/22/2020   Procedure: CYSTOSCOPY/RETROGRADE/URETEROSCOPY/HOLMIUM LASER/STENT PLACEMENT STONE BASKET RETRIVAL ;  Surgeon: Ceasar Mons, MD;  Location: [facility];  Service: Urology;  Laterality: Bilateral;  . IR ANGIOGRAM SELECTIVE EACH ADDITIONAL VESSEL  02/19/2020  . IR ANGIOGRAM SELECTIVE EACH ADDITIONAL VESSEL  02/19/2020  . IR ANGIOGRAM SELECTIVE EACH ADDITIONAL VESSEL  02/19/2020  . IR ANGIOGRAM SELECTIVE EACH ADDITIONAL VESSEL  02/19/2020  . IR ANGIOGRAM SELECTIVE EACH ADDITIONAL VESSEL  02/19/2020  . IR ANGIOGRAM SELECTIVE EACH ADDITIONAL VESSEL  02/19/2020  . IR ANGIOGRAM SELECTIVE EACH ADDITIONAL VESSEL  03/01/2020  . IR ANGIOGRAM SELECTIVE EACH ADDITIONAL VESSEL  03/01/2020  . IR ANGIOGRAM SELECTIVE EACH ADDITIONAL VESSEL  03/01/2020  . IR ANGIOGRAM VISCERAL SELECTIVE  02/19/2020  . IR ANGIOGRAM VISCERAL SELECTIVE  03/01/2020  . IR EMBO ARTERIAL NOT HEMORR HEMANG INC GUIDE ROADMAPPING  02/19/2020  . IR EMBO TUMOR ORGAN ISCHEMIA INFARCT INC GUIDE ROADMAPPING  02/19/2020  . IR EMBO TUMOR ORGAN ISCHEMIA INFARCT INC GUIDE ROADMAPPING  03/01/2020  . IR PARACENTESIS  07/15/2020  . IR PARACENTESIS  08/02/2020  . IR PARACENTESIS  08/19/2020  . IR PARACENTESIS  09/09/2020  . IR PARACENTESIS  10/07/2020  . IR RADIOLOGIST EVAL & MGMT  01/30/2020  . IR US GUIDE VASC ACCESS RIGHT  02/19/2020  . IR US GUIDE VASC ACCESS RIGHT  03/01/2020     reports that he quit smoking about 48 years ago. His smoking use included cigarettes. He  has a 0.50 pack-year smoking history. He has never used smokeless tobacco. He reports previous alcohol use of about 2.0 standard drinks of alcohol per week. He reports that he does not use drugs.  Allergies  Allergen Reactions  . Losartan Potassium Other (See Comments)  . Other Other (See  Comments)  . Xarelto [Rivaroxaban] Other (See Comments)    gross hematuria    Family History  Problem Relation Age of Onset  . Heart disease Father 70       Heart attack  . Heart disease Sister 17       Heart attack  . Breast cancer Mother   . Colon cancer Neg Hx   . Stomach cancer Neg Hx   . Pancreatic cancer Neg Hx   . Rectal cancer Neg Hx   . Esophageal cancer Neg Hx      Prior to Admission medications   Medication Sig Start Date End Date Taking? Authorizing Provider  acetaminophen (TYLENOL) 500 MG tablet Take 1,000 mg by mouth every 6 (six) hours as needed for mild pain.   Yes [provider]  Coenzyme Q10 (COQ10) 100 MG CAPS Take 100 mg by mouth daily.    Yes [provider]  diltiazem (CARDIZEM) 30 MG tablet Take 1 tablet (30 mg total) by mouth 3 (three) times daily. 09/06/20  Yes Minus Breeding, MD  furosemide (LASIX) 40 MG tablet Take 40 mg by mouth 3 (three) times a week. As needed for fluid   Yes [provider]  hydrOXYzine (ATARAX/VISTARIL) 10 MG tablet Take 10 mg by mouth every 8 (eight) hours as needed for itching or anxiety.   Yes [provider]  LORazepam (ATIVAN) 0.5 MG tablet Take 0.5-1 tablets (0.25-0.5 mg total) by mouth at bedtime as needed for anxiety or sleep. 10/02/20  Yes Owens Shark, NP  midodrine (PROAMATINE) 10 MG tablet Take 10 mg by mouth 3 (three) times daily.   Yes [provider]  spironolactone (ALDACTONE) 50 MG tablet TAKE ONE TABLET BY MOUTH DAILY Patient taking differently: Take 50 mg by mouth daily. 09/06/20  Yes Ladene Artist, MD    Physical Exam: Vitals:   10/09/20 1130 10/09/20 1250 10/09/20 1308 10/09/20 1338  BP: 100/73 112/78 109/71 104/63  Pulse: (!) 131 (!) 141 (!) 131 (!) 130  Resp: (!) 22 16 18 20   Temp:      TempSrc:      SpO2: 100% 96% 99% 99%  Weight:      Height:        Constitutional: NAD, calm, comfortable Vitals:   10/09/20 1130 10/09/20 1250 10/09/20 1308 10/09/20  1338  BP: 100/73 112/78 109/71 104/63  Pulse: (!) 131 (!) 141 (!) 131 (!) 130  Resp: (!) 22 16 18 20   Temp:      TempSrc:      SpO2: 100% 96% 99% 99%  Weight:      Height:       Eyes: PERRL, lids and conjunctivae jaundice ENMT: Mucous membranes are moist. Posterior pharynx clear of any exudate or lesions.Normal dentition.  Neck: normal, supple, no masses, no thyromegaly Respiratory: clear to auscultation bilaterally, no wheezing, no crackles. Normal respiratory effort. No accessory muscle use.  Cardiovascular: Regular rate and rhythm, no murmurs / rubs / gallops. No extremity edema. 2+ pedal pulses. No carotid bruits.  Abdomen: Distended, positive ascites sign no tenderness, no masses palpated. No hepatosplenomegaly. Bowel sounds positive.  Musculoskeletal: no clubbing / cyanosis. No joint deformity upper and  lower extremities. Good ROM, no contractures. Normal muscle tone.  Skin: no rashes, lesions, ulcers. No induration Neurologic: No facial droop, moving all limbs, not following command psychiatric: Confused and agitated    Labs on Admission: I have personally reviewed following labs and imaging studies  CBC: Recent Labs  Lab 10/09/20 1055 10/09/20 1412  WBC 7.2  --   NEUTROABS 6.2  --   HGB 10.1* 9.2*  HCT 30.1* 27.0*  MCV 106.4*  --   PLT 106*  --    Basic Metabolic Panel: Recent Labs  Lab 10/09/20 1055 10/09/20 1412  NA 133* 136  K 4.9 5.0  CL 102  --   CO2 21*  --   GLUCOSE 120*  --   BUN 76*  --   CREATININE 3.35*  --   CALCIUM 8.7*  --    GFR: Estimated Creatinine Clearance: 21.9 mL/min (A) (by C-G formula based on SCr of 3.35 mg/dL (H)). Liver Function Tests: Recent Labs  Lab 10/09/20 1055  AST 61*  ALT 46*  ALKPHOS 106  BILITOT 4.9*  PROT 7.2  ALBUMIN 2.2*   Recent Labs  Lab 10/09/20 1055  LIPASE 68*   Recent Labs  Lab 10/09/20 1055  AMMONIA 190*   Coagulation Profile: Recent Labs  Lab 10/09/20 1042  INR 1.7*   Cardiac  Enzymes: No results for input(s): CKTOTAL, CKMB, CKMBINDEX, TROPONINI in the last 168 hours. BNP (last 3 results) No results for input(s): PROBNP in the last 8760 hours. HbA1C: No results for input(s): HGBA1C in the last 72 hours. CBG: Recent Labs  Lab 10/09/20 0851  GLUCAP 110*   Lipid Profile: No results for input(s): CHOL, HDL, LDLCALC, TRIG, CHOLHDL, LDLDIRECT in the last 72 hours. Thyroid Function Tests: No results for input(s): TSH, T4TOTAL, FREET4, T3FREE, THYROIDAB in the last 72 hours. Anemia Panel: No results for input(s): VITAMINB12, FOLATE, FERRITIN, TIBC, IRON, RETICCTPCT in the last 72 hours. Urine analysis:    Component Value Date/Time   COLORURINE AMBER (A) 10/09/2020 1153   APPEARANCEUR CLEAR 10/09/2020 1153   LABSPEC 1.016 10/09/2020 1153   PHURINE 5.0 10/09/2020 1153   GLUCOSEU NEGATIVE 10/09/2020 1153   HGBUR NEGATIVE 10/09/2020 1153   BILIRUBINUR NEGATIVE 10/09/2020 1153   KETONESUR NEGATIVE 10/09/2020 1153   PROTEINUR NEGATIVE 10/09/2020 1153   UROBILINOGEN 1.0 07/23/2014 0402   NITRITE NEGATIVE 10/09/2020 1153   LEUKOCYTESUR SMALL (A) 10/09/2020 1153    Radiological Exams on Admission: CT Abdomen Pelvis Wo Contrast  Result Date: 10/09/2020 CLINICAL DATA:  Abdominal distension. 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 COMPARISON:  MRI from 07/04/2020 FINDINGS: Lower chest: Trace bilateral pleural effusions identified. Hepatobiliary: The liver appears cirrhotic. Treated tumor within segment 2 is measures 5.3 x 4.3 cm on today's exam, image 17/3. This is compared with 5.0 x 4.6 cm previously. The other known liver lesions are difficult to assess reflecting lack of IV contrast material. Sludge and tiny stones identified within the dependent portion of the  gallbladder. Pancreas: Unremarkable. No pancreatic ductal dilatation or surrounding inflammatory changes. Spleen: Normal in size without focal abnormality. Adrenals/Urinary Tract: Normal appearance of the adrenal glands. Small bilateral renal calculi identified. No hydronephrosis. Urinary bladder is unremarkable. Stomach/Bowel: The stomach appears nondistended. Small bowel loops are unremarkable. Mild diffuse colonic wall thickening is nonspecific in the setting of ascites and portal  venous hypertension. Vascular/Lymphatic: Aortic atherosclerosis. No aneurysm. Previous GDA embolization. Esophageal and upper abdominal varices. No abdominopelvic adenopathy. Reproductive: Prostate is unremarkable. Other: Large volume of ascites within the abdomen and pelvis. A left inguinal hernia is identified which is filled with ascites as well as a nonobstructed loop of sigmoid colon. No discrete fluid collections identified. Musculoskeletal: Bilateral L5 pars defects identified. Mild anterolisthesis of L5 on S1 noted. IMPRESSION: 1. Large volume of ascites identified within the abdomen and pelvis. 2. Cirrhosis with known multifocal hepatocellular carcinoma. Status post Y 90 microsphere ablation. Dominant lesion in segment 2 appears similar to 07/04/2020 3.  Aortic Atherosclerosis (ICD10-I70.0). 4. Gallstones. 5. Trace pleural effusions. Electronically Signed   By: Kerby Moors M.D.   On: 10/09/2020 13:49   CT Head Wo Contrast  Result Date: 10/09/2020 CLINICAL DATA:  Neurological deficit EXAM: CT HEAD WITHOUT CONTRAST TECHNIQUE: Contiguous axial images were obtained from the base of the skull through the vertex without intravenous contrast. COMPARISON:  Prior head CT 05/29/2020 FINDINGS: Brain: No evidence of acute infarction, hemorrhage, hydrocephalus, extra-axial collection or mass lesion/mass effect. Stable scattered foci of white matter hypoattenuation consistent with chronic microvascular ischemic white matter disease.  Tiny bilateral cerebellar hemisphere lacunar infarcts are also unchanged. Mild cortical atrophy without interval progression. Vascular: No hyperdense vessel or unexpected calcification. Atherosclerotic vascular calcifications in the bilateral cavernous and supraclinoid carotid arteries. Skull: Normal. Negative for fracture or focal lesion. Sinuses/Orbits: No acute finding. Other: None. IMPRESSION: 1. No acute intracranial abnormality. 2. Stable cortical atrophy, chronic microvascular ischemic white matter disease and bilateral cerebellar hemisphere lacunar infarcts. Electronically Signed   By: Jacqulynn Cadet M.D.   On: 10/09/2020 11:00   DG Chest Port 1 View  Result Date: 10/09/2020 CLINICAL DATA:  Altered mental status. History of hepatocellular carcinoma EXAM: PORTABLE CHEST 1 VIEW COMPARISON:  May 22, 2020. FINDINGS: Note that a small amount of the lateral left base not included. Central catheter tip is in the right atrium. No pneumothorax. There is no edema or airspace opacity. Heart is mildly enlarged with pulmonary vascularity normal. No adenopathy. There is aortic atherosclerosis. No bone lesions. A focus of calcification is noted in the left carotid artery. IMPRESSION: Note that a small portion of the lateral left base not included. Visualized lungs clear. Stable cardiac prominence. Central catheter tip in right atrium without pneumothorax. Aortic Atherosclerosis (ICD10-I70.0). Focus of calcification noted in left carotid artery. Electronically Signed   By: Lowella Grip III M.D.   On: 10/09/2020 09:36    EKG: Independently reviewed.  Rapid A. fib  Assessment/Plan Active Problems:   Sepsis ([place])   Delirium  (please populate well all problems here in Problem List. (For example, if patient is on BP meds at home and you resume or decide to hold them, it is a problem that needs to be her. Same for CAD, COPD, HLD and so on)  Delirium/acute hepatic encephalopathy -Likely from acute on  chronic hyper ammonemia. -Unlikely will tolerate p.o. lactulose right now, discussed with nurse, will start lactulose enema -Ammonia level BID -Change home Ativan to Serax -Halodol PRN  Sepsis -Wife reported patient still makes some urine, UA not significant for UTI.  Abdomen examination benign, will D/W GI to see whether another paracentesis needed at this time, as patient just had most recent paracentesis 2 days ago. -Discontinue vancomycin, continue cefepime for now. -Slow down the LR rate and use Albumin to hydrate. -Continue home midodrine  Decompensated liver failure, discriminant factor=24 -With coagulopathy, will treat  with Vit K -Hepatic encephalopathy treatment as above -Ascites, will consult GI -Portal hypertension, will hold off beta-blocker given he is no Cardizem drip now. -Not a candidate for liver transplant as per patient's family.  A. fib with RVR -On Cardizem drip, consider switch to beta-blocker to treat both A. fib and portal hypertension once tolerated PO. -Admit to progressive care for close monitoring and treatment.  Lactic acidosis -Suspect this is also related to liver failure. Given his CKD and cirrhosis condition, will not re-bolusing.  CKD stage V -Starting hemodialysis yesterday.  Nephrology consulted. -Decreased crystalloid infusion rate  Liver CA -Follow-up with oncology  Deconditioning -Overall prognosis grave, estimated life expectancy less than 6 months.  Strongly encourage family talk to oncology regarding prognosis given the complicated multiorgan failure condition including liver and kidney.  Wife at bedside agreed.  DVT prophylaxis: SCD Code Status: Full Code Family Communication: Wife at bedside Disposition Plan: Patient sick, expect more than 5 days hospital stay to treat multiorgan failure. Consults called: [facility] Admission status: PCU   Lequita Halt MD [facility] Pager [phone]  10/09/2020, 3:08 PM

## 2020-10-09 NOTE — Consult Note (Signed)
Reason for Consult: To manage dialysis and dialysis related needs Referring Physician: Dr Hadassah Pais is an 70 y.o. male.  HPI: Pt is a 63 M with cirrhosis and HCC s/p immunotherapy treatment, Afib, HLD, HTN, and ESRD on HD who started yesterday at [facility] presenting to [facility] for acute encephalopathy.    Pt went to dialysis yesterday for the first time and did well.  Came home and was mentating appropriately.  This AM, he was very hard to arouse and pt's wife brought him to ED.  There, CT head negative, CT abd/ pelvis with large ascites, ammonia 190, and lactate 7.9.  Was in Afib with RVR requiring dilt gtt.  Pt is obtunded and can't provide any history.  In this setting we are asked to see.  Dialyzes at [facility]) first rx was 10/08/20  Past Medical History:  Diagnosis Date  . Alcoholic cirrhosis ([place])   . Atrial fibrillation ([place])   . GERD (gastroesophageal reflux disease)   . Hepatocellular carcinoma ([place])   . History of chemotherapy taking immunotherapy   last dose 05-13-2020  . History of kidney stones   . Hyperlipidemia   . Hypertension     Past Surgical History:  Procedure Laterality Date  . APPENDECTOMY  yrs ago  . CYSTOSCOPY/URETEROSCOPY/HOLMIUM LASER/STENT PLACEMENT Bilateral 05/22/2020   Procedure: CYSTOSCOPY/RETROGRADE/URETEROSCOPY/HOLMIUM LASER/STENT PLACEMENT STONE BASKET RETRIVAL ;  Surgeon: Ceasar Mons, MD;  Location: [facility];  Service: Urology;  Laterality: Bilateral;  . IR ANGIOGRAM SELECTIVE EACH ADDITIONAL VESSEL  02/19/2020  . IR ANGIOGRAM SELECTIVE EACH ADDITIONAL VESSEL  02/19/2020  . IR ANGIOGRAM SELECTIVE EACH ADDITIONAL VESSEL  02/19/2020  . IR ANGIOGRAM SELECTIVE EACH ADDITIONAL VESSEL  02/19/2020  . IR ANGIOGRAM SELECTIVE EACH ADDITIONAL VESSEL  02/19/2020  . IR ANGIOGRAM SELECTIVE EACH ADDITIONAL VESSEL  02/19/2020  . IR ANGIOGRAM SELECTIVE EACH ADDITIONAL VESSEL  03/01/2020  . IR ANGIOGRAM SELECTIVE EACH  ADDITIONAL VESSEL  03/01/2020  . IR ANGIOGRAM SELECTIVE EACH ADDITIONAL VESSEL  03/01/2020  . IR ANGIOGRAM VISCERAL SELECTIVE  02/19/2020  . IR ANGIOGRAM VISCERAL SELECTIVE  03/01/2020  . IR EMBO ARTERIAL NOT HEMORR HEMANG INC GUIDE ROADMAPPING  02/19/2020  . IR EMBO TUMOR ORGAN ISCHEMIA INFARCT INC GUIDE ROADMAPPING  02/19/2020  . IR EMBO TUMOR ORGAN ISCHEMIA INFARCT INC GUIDE ROADMAPPING  03/01/2020  . IR PARACENTESIS  07/15/2020  . IR PARACENTESIS  08/02/2020  . IR PARACENTESIS  08/19/2020  . IR PARACENTESIS  09/09/2020  . IR PARACENTESIS  10/07/2020  . IR RADIOLOGIST EVAL & MGMT  01/30/2020  . IR US GUIDE VASC ACCESS RIGHT  02/19/2020  . IR US GUIDE VASC ACCESS RIGHT  03/01/2020    Family History  Problem Relation Age of Onset  . Heart disease Father 71       Heart attack  . Heart disease Sister 26       Heart attack  . Breast cancer Mother   . Colon cancer Neg Hx   . Stomach cancer Neg Hx   . Pancreatic cancer Neg Hx   . Rectal cancer Neg Hx   . Esophageal cancer Neg Hx     Social History:  reports that he quit smoking about 48 years ago. His smoking use included cigarettes. He has a 0.50 pack-year smoking history. He has never used smokeless tobacco. He reports previous alcohol use of about 2.0 standard drinks of alcohol per week. He reports that he does not use drugs.  Allergies:  Allergies  Allergen Reactions  . Losartan Potassium Other (See Comments)  . Other Other (See Comments)  . Xarelto [Rivaroxaban] Other (See Comments)    gross hematuria    Medications: Prior to Admission: (Not in a hospital admission)    Results for orders placed or performed during the hospital encounter of 10/09/20 (from the past 48 hour(s))  CBG monitoring, ED     Status: Abnormal   Collection Time: 10/09/20  8:51 AM  Result Value Ref Range   Glucose-Capillary 110 (H) 70 - 99 mg/dL    Comment: Glucose reference range applies only to samples taken after fasting for at least 8 hours.   Comment 1  Notify RN    Comment 2 Document in Chart   Lactic acid, plasma     Status: Abnormal   Collection Time: 10/09/20  9:24 AM  Result Value Ref Range   Lactic Acid, Venous 4.0 (HH) 0.5 - 1.9 mmol/L    Comment: CRITICAL RESULT CALLED TO, READ BACK BY AND VERIFIED WITH: MEEKS,L RN @1026  ON [id number] BY FLEMINGS Performed at [facility], [place] [address], [place], [place] [address]   Ethanol     Status: None   Collection Time: 10/09/20  9:44 AM  Result Value Ref Range   Alcohol, Ethyl (B) <10 <10 mg/dL    Comment: (NOTE) Lowest detectable limit for serum alcohol is 10 mg/dL.  For medical purposes only. Performed at [facility], [place] [address], [place], [place] [address]   Resp Panel by RT-PCR (Flu A&B, Covid) Nasopharyngeal Swab     Status: None   Collection Time: 10/09/20  9:51 AM   Specimen: Nasopharyngeal Swab; Nasopharyngeal(NP) swabs in vial transport medium  Result Value Ref Range   SARS Coronavirus 2 by RT PCR NEGATIVE NEGATIVE    Comment: (NOTE) SARS-CoV-2 target nucleic acids are NOT DETECTED.  The SARS-CoV-2 RNA is generally detectable in upper respiratory specimens during the acute phase of infection. The lowest concentration of SARS-CoV-2 viral copies this assay can detect is 138 copies/mL. A negative result does not preclude SARS-Cov-2 infection and should not be used as the sole basis for treatment or other patient management decisions. A negative result may occur with  improper specimen collection/handling, submission of specimen other than nasopharyngeal swab, presence of viral mutation(s) within the areas targeted by this assay, and inadequate number of viral copies(<138 copies/mL). A negative result must be combined with clinical observations, patient history, and epidemiological information. The expected result is Negative.  Fact Sheet for Patients:  EntrepreneurPulse.com.au  Fact Sheet for Healthcare Providers:   IncredibleEmployment.be  This test is no t yet approved or cleared by the [place] FDA and  has been authorized for detection and/or diagnosis of SARS-CoV-2 by FDA under an Emergency Use Authorization (EUA). This EUA will remain  in effect (meaning this test can be used) for the duration of the COVID-19 declaration under Section 564(b)(1) of the Act, 21 U.S.C.section 360bbb-3(b)(1), unless the authorization is terminated  or revoked sooner.       Influenza A by PCR NEGATIVE NEGATIVE   Influenza B by PCR NEGATIVE NEGATIVE    Comment: (NOTE) The Xpert Xpress SARS-CoV-2/FLU/RSV plus assay is intended as an aid in the diagnosis of influenza from Nasopharyngeal swab specimens and should not be used as a sole basis for treatment. Nasal washings and aspirates are unacceptable for Xpert Xpress SARS-CoV-2/FLU/RSV testing.  Fact Sheet for Patients: EntrepreneurPulse.com.au  Fact Sheet for Healthcare Providers: IncredibleEmployment.be  This  test is not yet approved or cleared by the [place] and has been authorized for detection and/or diagnosis of SARS-CoV-2 by FDA under an Emergency Use Authorization (EUA). This EUA will remain in effect (meaning this test can be used) for the duration of the COVID-19 declaration under Section 564(b)(1) of the Act, 21 U.S.C. section 360bbb-3(b)(1), unless the authorization is terminated or revoked.  Performed at [facility], [place] [address], [place], [place] [address]   APTT     Status: Abnormal   Collection Time: 10/09/20 10:42 AM  Result Value Ref Range   aPTT 41 (H) 24 - 36 seconds    Comment:        IF BASELINE aPTT IS ELEVATED, SUGGEST PATIENT RISK ASSESSMENT BE USED TO DETERMINE APPROPRIATE ANTICOAGULANT THERAPY. Performed at [facility], [place] [address], [place], [place] [address]   Protime-INR     Status: Abnormal   Collection Time: 10/09/20 10:42 AM   Result Value Ref Range   Prothrombin Time 19.4 (H) 11.4 - 15.2 seconds   INR 1.7 (H) 0.8 - 1.2    Comment: (NOTE) INR goal varies based on device and disease states. Performed at [facility], [place] [address], [place], [place] [address]   Comprehensive metabolic panel     Status: Abnormal   Collection Time: 10/09/20 10:55 AM  Result Value Ref Range   Sodium 133 (L) 135 - 145 mmol/L   Potassium 4.9 3.5 - 5.1 mmol/L   Chloride 102 98 - 111 mmol/L   CO2 21 (L) 22 - 32 mmol/L   Glucose, Bld 120 (H) 70 - 99 mg/dL    Comment: Glucose reference range applies only to samples taken after fasting for at least 8 hours.   BUN 76 (H) 8 - 23 mg/dL   Creatinine, Ser 3.35 (H) 0.61 - 1.24 mg/dL   Calcium 8.7 (L) 8.9 - 10.3 mg/dL   Total Protein 7.2 6.5 - 8.1 g/dL   Albumin 2.2 (L) 3.5 - 5.0 g/dL   AST 61 (H) 15 - 41 U/L   ALT 46 (H) 0 - 44 U/L   Alkaline Phosphatase 106 38 - 126 U/L   Total Bilirubin 4.9 (H) 0.3 - 1.2 mg/dL   GFR, Estimated 19 (L) >60 mL/min    Comment: (NOTE) Calculated using the CKD-EPI Creatinine Equation (2021)    Anion gap 10 5 - 15    Comment: Performed at [facility], [place] [address] [place], [place] [address]  Lipase, blood     Status: Abnormal   Collection Time: 10/09/20 10:55 AM  Result Value Ref Range   Lipase 68 (H) 11 - 51 U/L    Comment: Performed at [place] [address], [place], [place] [address]  Ammonia     Status: Abnormal   Collection Time: 10/09/20 10:55 AM  Result Value Ref Range   Ammonia 190 (H) 9 - 35 umol/L    Comment: Performed at [facility], [place] [address], [place],  [address]  CBC with Differential     Status: Abnormal   Collection Time: 10/09/20 10:55 AM  Result Value Ref Range   WBC 7.2 4.0 - 10.5 K/uL   RBC 2.83 (L) 4.22 - 5.81 MIL/uL   Hemoglobin 10.1 (L) 13.0 - 17.0 g/dL   HCT 30.1 (L) 39.0 - 52.0 %   MCV 106.4 (H) 80.0 - 100.0 fL   MCH 35.7 (H) 26.0 - 34.0 pg  MCHC 33.6 30.0 - 36.0 g/dL    RDW 17.5 (H) 11.5 - 15.5 %   Platelets 106 (L) 150 - 400 K/uL    Comment: Immature Platelet Fraction may be clinically indicated, consider ordering this additional test [id number] CONSISTENT WITH PREVIOUS RESULT REPEATED TO VERIFY    nRBC 0.0 0.0 - 0.2 %   Neutrophils Relative % 87 %   Neutro Abs 6.2 1.7 - 7.7 K/uL   Lymphocytes Relative 7 %   Lymphs Abs 0.5 (L) 0.7 - 4.0 K/uL   Monocytes Relative 6 %   Monocytes Absolute 0.4 0.1 - 1.0 K/uL   Eosinophils Relative 0 %   Eosinophils Absolute 0.0 0.0 - 0.5 K/uL   Basophils Relative 0 %   Basophils Absolute 0.0 0.0 - 0.1 K/uL   Immature Granulocytes 0 %   Abs Immature Granulocytes 0.03 0.00 - 0.07 K/uL    Comment: Performed at [facility], [address], [place], [place] [address]  Urinalysis, Routine w reflex microscopic Urine, Catheterized     Status: Abnormal   Collection Time: 10/09/20 11:53 AM  Result Value Ref Range   Color, Urine AMBER (A) YELLOW    Comment: BIOCHEMICALS MAY BE AFFECTED BY COLOR   APPearance CLEAR CLEAR   Specific Gravity, Urine 1.016 1.005 - 1.030   pH 5.0 5.0 - 8.0   Glucose, UA NEGATIVE NEGATIVE mg/dL   Hgb urine dipstick NEGATIVE NEGATIVE   Bilirubin Urine NEGATIVE NEGATIVE   Ketones, ur NEGATIVE NEGATIVE mg/dL   Protein, ur NEGATIVE NEGATIVE mg/dL   Nitrite NEGATIVE NEGATIVE   Leukocytes,Ua SMALL (A) NEGATIVE   RBC / HPF 0-5 0 - 5 RBC/hpf   WBC, UA 11-20 0 - 5 WBC/hpf   Bacteria, UA RARE (A) NONE SEEN   Hyaline Casts, UA PRESENT     Comment: Performed at [facility], [place] [address] [place], [place] [address]  Rapid urine drug screen (hospital performed)     Status: Abnormal   Collection Time: 10/09/20 11:53 AM  Result Value Ref Range   Opiates NONE DETECTED NONE DETECTED   Cocaine NONE DETECTED NONE DETECTED   Benzodiazepines POSITIVE (A) NONE DETECTED   Amphetamines NONE DETECTED NONE DETECTED   Tetrahydrocannabinol NONE DETECTED NONE DETECTED   Barbiturates NONE DETECTED  NONE DETECTED    Comment: (NOTE) DRUG SCREEN FOR MEDICAL PURPOSES ONLY.  IF CONFIRMATION IS NEEDED FOR ANY PURPOSE, NOTIFY LAB WITHIN 5 DAYS.  LOWEST DETECTABLE LIMITS FOR URINE DRUG SCREEN Drug Class                     Cutoff (ng/mL) Amphetamine and metabolites    1000 Barbiturate and metabolites    200 Benzodiazepine                 638 Tricyclics and metabolites     300 Opiates and metabolites        300 Cocaine and metabolites        300 THC                            50 Performed at [facility], [place] [address] [place], [place] [address]   Lactic acid, plasma     Status: Abnormal   Collection Time: 10/09/20  1:35 PM  Result Value Ref Range   Lactic Acid, Venous 7.9 (HH) 0.5 - 1.9 mmol/L    Comment: CRITICAL VALUE NOTED.  VALUE  IS CONSISTENT WITH PREVIOUSLY REPORTED AND CALLED VALUE. Performed at [facility], [place] [address], [place], [place] [address]   I-Stat venous blood gas, [facility] ED)     Status: Abnormal   Collection Time: 10/09/20  2:12 PM  Result Value Ref Range   pH, Ven 7.474 (H) 7.250 - 7.430   pCO2, Ven 28.7 (L) 44.0 - 60.0 mmHg   pO2, Ven 83.0 (H) 32.0 - 45.0 mmHg   Bicarbonate 21.1 20.0 - 28.0 mmol/L   TCO2 22 22 - 32 mmol/L   O2 Saturation 97.0 %   Acid-base deficit 2.0 0.0 - 2.0 mmol/L   Sodium 136 135 - 145 mmol/L   Potassium 5.0 3.5 - 5.1 mmol/L   Calcium, Ion 1.11 (L) 1.15 - 1.40 mmol/L   HCT 27.0 (L) 39.0 - 52.0 %   Hemoglobin 9.2 (L) 13.0 - 17.0 g/dL   Sample type VENOUS     CT Abdomen Pelvis Wo Contrast  Result Date: 10/09/2020 CLINICAL DATA:  Abdominal distension. 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 COMPARISON:  MRI from 07/04/2020 FINDINGS: Lower chest: Trace bilateral pleural effusions identified. Hepatobiliary: The liver  appears cirrhotic. Treated tumor within segment 2 is measures 5.3 x 4.3 cm on today's exam, image 17/3. This is compared with 5.0 x 4.6 cm previously. The other known liver lesions are difficult to assess reflecting lack of IV contrast material. Sludge and tiny stones identified within the dependent portion of the gallbladder. Pancreas: Unremarkable. No pancreatic ductal dilatation or surrounding inflammatory changes. Spleen: Normal in size without focal abnormality. Adrenals/Urinary Tract: Normal appearance of the adrenal glands. Small bilateral renal calculi identified. No hydronephrosis. Urinary bladder is unremarkable. Stomach/Bowel: The stomach appears nondistended. Small bowel loops are unremarkable. Mild diffuse colonic wall thickening is nonspecific in the setting of ascites and portal venous hypertension. Vascular/Lymphatic: Aortic atherosclerosis. No aneurysm. Previous GDA embolization. Esophageal and upper abdominal varices. No abdominopelvic adenopathy. Reproductive: Prostate is unremarkable. Other: Large volume of ascites within the abdomen and pelvis. A left inguinal hernia is identified which is filled with ascites as well as a nonobstructed loop of sigmoid colon. No discrete fluid collections identified. Musculoskeletal: Bilateral L5 pars defects identified. Mild anterolisthesis of L5 on S1 noted. IMPRESSION: 1. Large volume of ascites identified within the abdomen and pelvis. 2. Cirrhosis with known multifocal hepatocellular carcinoma. Status post Y 90 microsphere ablation. Dominant lesion in segment 2 appears similar to 07/04/2020 3.  Aortic Atherosclerosis (ICD10-I70.0). 4. Gallstones. 5. Trace pleural effusions. Electronically Signed   By: Kerby Moors M.D.   On: 10/09/2020 13:49   CT Head Wo Contrast  Result Date: 10/09/2020 CLINICAL DATA:  Neurological deficit EXAM: CT HEAD WITHOUT CONTRAST TECHNIQUE: Contiguous axial images were obtained from the base of the skull through the vertex  without intravenous contrast. COMPARISON:  Prior head CT 05/29/2020 FINDINGS: Brain: No evidence of acute infarction, hemorrhage, hydrocephalus, extra-axial collection or mass lesion/mass effect. Stable scattered foci of white matter hypoattenuation consistent with chronic microvascular ischemic white matter disease. Tiny bilateral cerebellar hemisphere lacunar infarcts are also unchanged. Mild cortical atrophy without interval progression. Vascular: No hyperdense vessel or unexpected calcification. Atherosclerotic vascular calcifications in the bilateral cavernous and supraclinoid carotid arteries. Skull: Normal. Negative for fracture or focal lesion. Sinuses/Orbits: No acute finding. Other: None. IMPRESSION: 1. No acute intracranial abnormality. 2. Stable cortical  atrophy, chronic microvascular ischemic white matter disease and bilateral cerebellar hemisphere lacunar infarcts. Electronically Signed   By: Jacqulynn Cadet M.D.   On: 10/09/2020 11:00   DG Chest Port 1 View  Result Date: 10/09/2020 CLINICAL DATA:  Altered mental status. History of hepatocellular carcinoma EXAM: PORTABLE CHEST 1 VIEW COMPARISON:  May 22, 2020. FINDINGS: Note that a small amount of the lateral left base not included. Central catheter tip is in the right atrium. No pneumothorax. There is no edema or airspace opacity. Heart is mildly enlarged with pulmonary vascularity normal. No adenopathy. There is aortic atherosclerosis. No bone lesions. A focus of calcification is noted in the left carotid artery. IMPRESSION: Note that a small portion of the lateral left base not included. Visualized lungs clear. Stable cardiac prominence. Central catheter tip in right atrium without pneumothorax. Aortic Atherosclerosis (ICD10-I70.0). Focus of calcification noted in left carotid artery. Electronically Signed   By: Lowella Grip III M.D.   On: 10/09/2020 09:36    ROS: unable obtain d/t encephalopathy Blood pressure 110/79, pulse (!)  113, temperature (!) 96.7 F (35.9 C), temperature source Rectal, resp. rate (!) 21, height 5\' 8"  (1.727 m), weight 88.3 kg, SpO2 98 %. .  GEN obtunded lying in gurney, snoring HEENT eyes closed PULM clear CV irregular ABD distended with + fluid wave EXT 1+ LE edema NEURO + asterixis and obtunded SKIN jaundiced ACCESS: R IJ TDC  Assessment/Plan: 1  Hepatic encephalopathy: ammonia 190.  GI involved.  Anticipate lactulose, paracentesis, FOBT.  Cultures already drawn and vanc/ cefepime given.  On midodrine as well, getting albumin. 2 ESRD:  TTS at [facility].  Will write 2nd treatment orders for tomorrow 3 Hypotension: on midodrine 4.  Afib with RVR: on dilt gtt 5.  Sepsis: cultures, vanc/ cefepime, anticipate he'll need a tap 6. Anemia of ESRD: follow for ESA needs 7.  Dispo: being admitted  Madelon Lips 10/09/2020, 4:07 PM

## 2020-10-09 NOTE — ED Triage Notes (Signed)
Pt arrvies via EMS from home with complaints of AMS. Woke up this am moaning and not able to answer orientation questions. Unable to follow commands. LSN 2100 10/08/20 First HD 10/08/20, completed TX. Was alert and oriented X4 at that point and ambulatory Liver failure and kidney failure history  HR 130-150 106/56 100% Ra CBG 117 RR 18  20gLA

## 2020-10-09 NOTE — ED Notes (Signed)
Flexi seal placed  

## 2020-10-09 NOTE — Consult Note (Addendum)
[facility] Consult: 3:31 PM 10/09/2020  LOS: 0 days    Referring Provider: Dr Roosevelt Locks  Primary Care Physician:  Antony Contras, MD . Primary Gastroenterologist:  Dr. Fuller Plan Oncologist: Dr. Benay Spice   Reason for Consultation: Decompensated cirrhosis.   HPI: Dillon Perkins is a 70 y.o. male.  Hx CKD 5, now ESRD..  A. Fib not on AC (recommended but patient refused).  Long segment Barrett's at EGD 03/2020, no biopsies obtained due to presence of esophageal varices.  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  Esophageal varices.  Abdominal ascites.  Starting on 1/10 through 10/07/2020 has undergone 5 paracenteses, 3 L removed each time, no SBP on fluid studies.  Current diuretics consist of spironolactone 50 mg daily, furosemide 40 mg 3 times weekly. Multifocal [place] treated with Y 92 02/2020, nivolumab. Restaging MRI 07/04/2020 showed significantly improved tumor burden in the left liver and mixed response in the right liver.  Underwent his first hemodialysis treatment on 10/08/2020 and had no problems during or after the session.  His schedule will be TTS HD. Developed altered mental status this morning.  Found moaning and disoriented this morning. Not previously taking any meds for HE as he never reported symptoms to his physicians which were suggestive of HE In the ED he was in rapid A. fib, Cardizem initiated.  Troponins not elevated.  BNP 2740 Ammonia level 190. Sodium 133.  BUN/creatinine 76/3.3.  GFR 19.  T bili 4.9.  Alk phos 106.  AST/ALT 61/46.  Lipase 68. Venous lactic acid 7.9. Hb 9.2.  MCV 106.  Platelets 106.  INR 1.7 MELD-Na 31, MELD 30 Discriminant Fx score 34.    Patient has been abstinent from alcohol for over a year.  He quit drinking when he received diagnosis of cirrhosis. Wife has noticed several weeks of  psychomotor slowing, delay.  No overt confusion until a few days ago.  Has had intermittent nausea but mostly dry heaves at most a couple of times a day for a couple of weeks.  Anorexia, basically subsisting on 2 Ensure a day.  Abdominal swelling but no pain.  No excessive or unusual bleeding or bruising.  No tremors.  Complained to Dr. Benay Spice of diarrhea at office visit last week.  He recommended Imodium which he took.  Had not had a bowel movement for several days but last night had a loose stool and took another Imodium.  Progressive abdominal distention/increased abdominal girth over about a month.  No lower extremity edema.  No scrotal edema.    Past Medical History:  Diagnosis Date  . Alcoholic cirrhosis ([place])   . Atrial fibrillation ([place])   . GERD (gastroesophageal reflux disease)   . Hepatocellular carcinoma ([place])   . History of chemotherapy taking immunotherapy   last dose 05-13-2020  . History of kidney stones   . Hyperlipidemia   . Hypertension     Past Surgical History:  Procedure Laterality Date  . APPENDECTOMY  yrs ago  . CYSTOSCOPY/URETEROSCOPY/HOLMIUM LASER/STENT PLACEMENT Bilateral 05/22/2020   Procedure: CYSTOSCOPY/RETROGRADE/URETEROSCOPY/HOLMIUM LASER/STENT PLACEMENT STONE BASKET RETRIVAL ;  Surgeon: Ceasar Mons, MD;  Location: [facility];  Service: Urology;  Laterality: Bilateral;  . IR ANGIOGRAM SELECTIVE EACH ADDITIONAL VESSEL  02/19/2020  . IR ANGIOGRAM SELECTIVE EACH ADDITIONAL VESSEL  02/19/2020  . IR ANGIOGRAM SELECTIVE EACH ADDITIONAL VESSEL  02/19/2020  . IR ANGIOGRAM SELECTIVE EACH ADDITIONAL VESSEL  02/19/2020  . IR ANGIOGRAM SELECTIVE EACH ADDITIONAL VESSEL  02/19/2020  . IR ANGIOGRAM SELECTIVE EACH ADDITIONAL VESSEL  02/19/2020  . IR ANGIOGRAM SELECTIVE EACH ADDITIONAL VESSEL  03/01/2020  . IR ANGIOGRAM SELECTIVE EACH ADDITIONAL VESSEL  03/01/2020  . IR ANGIOGRAM SELECTIVE EACH ADDITIONAL VESSEL  03/01/2020  . IR ANGIOGRAM VISCERAL  SELECTIVE  02/19/2020  . IR ANGIOGRAM VISCERAL SELECTIVE  03/01/2020  . IR EMBO ARTERIAL NOT HEMORR HEMANG INC GUIDE ROADMAPPING  02/19/2020  . IR EMBO TUMOR ORGAN ISCHEMIA INFARCT INC GUIDE ROADMAPPING  02/19/2020  . IR EMBO TUMOR ORGAN ISCHEMIA INFARCT INC GUIDE ROADMAPPING  03/01/2020  . IR PARACENTESIS  07/15/2020  . IR PARACENTESIS  08/02/2020  . IR PARACENTESIS  08/19/2020  . IR PARACENTESIS  09/09/2020  . IR PARACENTESIS  10/07/2020  . IR RADIOLOGIST EVAL & MGMT  01/30/2020  . IR US GUIDE VASC ACCESS RIGHT  02/19/2020  . IR US GUIDE VASC ACCESS RIGHT  03/01/2020    Prior to Admission medications   Medication Sig Start Date End Date Taking? Authorizing Provider  acetaminophen (TYLENOL) 500 MG tablet Take 1,000 mg by mouth every 6 (six) hours as needed for mild pain.   Yes [provider]  Coenzyme Q10 (COQ10) 100 MG CAPS Take 100 mg by mouth daily.    Yes [provider]  diltiazem (CARDIZEM) 30 MG tablet Take 1 tablet (30 mg total) by mouth 3 (three) times daily. 09/06/20  Yes Minus Breeding, MD  furosemide (LASIX) 40 MG tablet Take 40 mg by mouth 3 (three) times a week. As needed for fluid   Yes [provider]  hydrOXYzine (ATARAX/VISTARIL) 10 MG tablet Take 10 mg by mouth every 8 (eight) hours as needed for itching or anxiety.   Yes [provider]  LORazepam (ATIVAN) 0.5 MG tablet Take 0.5-1 tablets (0.25-0.5 mg total) by mouth at bedtime as needed for anxiety or sleep. 10/02/20  Yes Owens Shark, NP  midodrine (PROAMATINE) 10 MG tablet Take 10 mg by mouth 3 (three) times daily.   Yes [provider]  spironolactone (ALDACTONE) 50 MG tablet TAKE ONE TABLET BY MOUTH DAILY Patient taking differently: Take 50 mg by mouth daily. 09/06/20  Yes Ladene Artist, MD    Scheduled Meds: . lactulose  300 mL Rectal BID  . midodrine  10 mg Oral TID  . phytonadione  5 mg Oral Once   Infusions: . albumin human    . [START ON 10/10/2020] ceFEPime (MAXIPIME) IV     . diltiazem (CARDIZEM) infusion 10 mg/hr (10/09/20 1315)  . lactated ringers    . lactated ringers     PRN Meds: haloperidol lactate, hydrOXYzine, ondansetron **OR** ondansetron (ZOFRAN) IV, oxazepam   Allergies as of 10/09/2020 - Review Complete 10/09/2020  Allergen Reaction Noted  . Losartan potassium Other (See Comments) 05/31/2020  . Other Other (See Comments) 05/31/2020  . Xarelto [rivaroxaban] Other (See Comments) 09/25/2020    Family History  Problem Relation Age of Onset  . Heart disease Father 53       Heart attack  . Heart disease Sister 78       Heart attack  .  Breast cancer Mother   . Colon cancer Neg Hx   . Stomach cancer Neg Hx   . Pancreatic cancer Neg Hx   . Rectal cancer Neg Hx   . Esophageal cancer Neg Hx     Social History   Socioeconomic History  . Marital status: Married    Spouse name: [name]  . Number of children: 3  . Years of education: Not on file  . Highest education level: Not on file  Occupational History  . Occupation: retired  Tobacco Use  . Smoking status: Former Smoker    Packs/day: 1.00    Years: 0.50    Pack years: 0.50    Types: Cigarettes    Quit date: 08/21/1972    Years since quitting: 48.1  . Smokeless tobacco: Never Used  Vaping Use  . Vaping Use: Never used  Substance and Sexual Activity  . Alcohol use: Not Currently    Alcohol/week: 2.0 standard drinks    Types: 2 Cans of beer per week  . Drug use: No  . Sexual activity: Not on file  Other Topics Concern  . Not on file  Social History Narrative   Lives with wife.  Retired.  Three sons.    Social Determinants of Health   Financial Resource Strain: Not on file  Food Insecurity: Not on file  Transportation Needs: Not on file  Physical Activity: Not on file  Stress: Not on file  Social Connections: Not on file  Intimate Partner Violence: Not on file    REVIEW OF SYSTEMS: Constitutional: Weakness. ENT:  No nose bleeds Pulm: No shortness of breath or  cough CV:  No palpitations, no LE edema.  No angina GU:  No hematuria, no frequency GI: See HPI Heme: No unusual bleeding.  Does bruise easily but no large hematomas. Transfusions: None. Neuro:  No headaches, no peripheral tingling or numbness.  No seizures, no syncope. Derm:  No itching, no rash or sores.  Endocrine:  No sweats or chills.  No polyuria or dysuria Immunization: Reviewed.  He has been vaccinated x3 with Pfizer COVID-19 vaccine. Travel:  None beyond local counties in last few months.    PHYSICAL EXAM: Vital signs in last 24 hours: Vitals:   10/09/20 1308 10/09/20 1338  BP: 109/71 104/63  Pulse: (!) 131 (!) 130  Resp: 18 20  Temp:    SpO2: 99% 99%   Wt Readings from Last 3 Encounters:  10/09/20 88.3 kg  10/02/20 88.1 kg  09/24/20 84.7 kg    General: Patient is ill-appearing, bloated, not responding to me.  Laying on stretcher and moving in nonpurposeful way. Head: No signs of head trauma. Eyes: Scleral icterus Ears: Not able to assess hearing as he is obtunded. Nose: No discharge or congestion. Mouth: No blood at the oral opening but patient did not follow command to open his mouth and I did not force the issue. Neck: No JVD, no masses, no thyromegaly. Lungs: Snoring at times.  Irregular breathing pattern at times but no resting dyspnea. Heart: RRR.  No MRG.  S1, S2 present Abdomen: Distended/protuberant.  Not tender.  Active bowel sounds.  Soft.  Ascites present.   Rectal: Deferred Musc/Skeltl: No joint redness, swelling or gross deformity. Extremities: Dusky appearance to the lower legs but no swelling and feet are warm with brisk reperfusion. Neurologic: Obtunded, not following commands or speaking.  Grunts/snores at times. Skin: Telangiectasia across the torso.  No significant hematomas Nodes: No cervical adenopathy Psych: Unable to assess.  Intake/Output from previous day: No intake/output data recorded. Intake/Output this shift: Total I/O In: 3350  [I.V.:250; IV Piggyback:3100] Out: -   LAB RESULTS: Recent Labs    10/09/20 1055 10/09/20 1412  WBC 7.2  --   HGB 10.1* 9.2*  HCT 30.1* 27.0*  PLT 106*  --    BMET Lab Results  Component Value Date   NA 136 10/09/2020   NA 133 (L) 10/09/2020   NA 136 09/27/2020   K 5.0 10/09/2020   K 4.9 10/09/2020   K 4.0 09/27/2020   CL 102 10/09/2020   CL 103 09/27/2020   CL 104 09/24/2020   CO2 21 (L) 10/09/2020   CO2 19 (L) 09/27/2020   CO2 14 (L) 09/24/2020   GLUCOSE 120 (H) 10/09/2020   GLUCOSE 147 (H) 09/27/2020   GLUCOSE 162 (H) 09/24/2020   BUN 76 (H) 10/09/2020   BUN 88 (H) 09/27/2020   BUN 83 (H) 09/24/2020   CREATININE 3.35 (H) 10/09/2020   CREATININE 3.34 (HH) 09/27/2020   CREATININE 3.48 (HH) 09/24/2020   CALCIUM 8.7 (L) 10/09/2020   CALCIUM 9.1 09/27/2020   CALCIUM 8.9 09/24/2020   LFT Recent Labs    10/09/20 1055  PROT 7.2  ALBUMIN 2.2*  AST 61*  ALT 46*  ALKPHOS 106  BILITOT 4.9*   PT/INR Lab Results  Component Value Date   INR 1.7 (H) 10/09/2020   INR 1.4 (H) 07/18/2020   INR 1.5 (H) 07/04/2020   Hepatitis Panel No results for input(s): HEPBSAG, HCVAB, HEPAIGM, HEPBIGM in the last 72 hours. C-Diff No components found for: CDIFF Lipase     Component Value Date/Time   LIPASE 68 (H) 10/09/2020 1055    Drugs of Abuse     Component Value Date/Time   LABOPIA NONE DETECTED 10/09/2020 1153   COCAINSCRNUR NONE DETECTED 10/09/2020 1153   LABBENZ POSITIVE (A) 10/09/2020 1153   AMPHETMU NONE DETECTED 10/09/2020 1153   THCU NONE DETECTED 10/09/2020 1153   LABBARB NONE DETECTED 10/09/2020 1153     RADIOLOGY STUDIES: CT Abdomen Pelvis Wo Contrast  Result Date: 10/09/2020 CLINICAL DATA:  Abdominal distension. Cirrhosis hepatocellular carcinoma status post Y- 90 microsphere ablation EXAM: CT ABDOMEN AND PELVIS WITHOUT CONTRAST TECHNIQUE: Multidetector CT imaging of the abdomen and pelvis was performed following the standard protocol without IV  contrast. COMPARISON:  MRI from 07/04/2020 FINDINGS: Lower chest: Trace bilateral pleural effusions identified. Hepatobiliary: The liver appears cirrhotic. Treated tumor within segment 2 is measures 5.3 x 4.3 cm on today's exam, image 17/3. This is compared with 5.0 x 4.6 cm previously. The other known liver lesions are difficult to assess reflecting lack of IV contrast material. Sludge and tiny stones identified within the dependent portion of the gallbladder. Pancreas: Unremarkable. No pancreatic ductal dilatation or surrounding inflammatory changes. Spleen: Normal in size without focal abnormality. Adrenals/Urinary Tract: Normal appearance of the adrenal glands. Small bilateral renal calculi identified. No hydronephrosis. Urinary bladder is unremarkable. Stomach/Bowel: The stomach appears nondistended. Small bowel loops are unremarkable. Mild diffuse colonic wall thickening is nonspecific in the setting of ascites and portal venous hypertension. Vascular/Lymphatic: Aortic atherosclerosis. No aneurysm. Previous GDA embolization. Esophageal and upper abdominal varices. No abdominopelvic adenopathy. Reproductive: Prostate is unremarkable. Other: Large volume of ascites within the abdomen and pelvis. A left inguinal hernia is identified which is filled with ascites as well as a nonobstructed loop of sigmoid colon. No discrete fluid collections identified. Musculoskeletal: Bilateral L5 pars defects identified. Mild anterolisthesis of L5 on S1 noted.  IMPRESSION: 1. Large volume of ascites identified within the abdomen and pelvis. 2. Cirrhosis with known multifocal hepatocellular carcinoma. Status post Y 90 microsphere ablation. Dominant lesion in segment 2 appears similar to 07/04/2020 3.  Aortic Atherosclerosis (ICD10-I70.0). 4. Gallstones. 5. Trace pleural effusions. Electronically Signed   By: Kerby Moors M.D.   On: 10/09/2020 13:49   CT Head Wo Contrast  Result Date: 10/09/2020 CLINICAL DATA:  Neurological  deficit EXAM: CT HEAD WITHOUT CONTRAST TECHNIQUE: Contiguous axial images were obtained from the base of the skull through the vertex without intravenous contrast. COMPARISON:  Prior head CT 05/29/2020 FINDINGS: Brain: No evidence of acute infarction, hemorrhage, hydrocephalus, extra-axial collection or mass lesion/mass effect. Stable scattered foci of white matter hypoattenuation consistent with chronic microvascular ischemic white matter disease. Tiny bilateral cerebellar hemisphere lacunar infarcts are also unchanged. Mild cortical atrophy without interval progression. Vascular: No hyperdense vessel or unexpected calcification. Atherosclerotic vascular calcifications in the bilateral cavernous and supraclinoid carotid arteries. Skull: Normal. Negative for fracture or focal lesion. Sinuses/Orbits: No acute finding. Other: None. IMPRESSION: 1. No acute intracranial abnormality. 2. Stable cortical atrophy, chronic microvascular ischemic white matter disease and bilateral cerebellar hemisphere lacunar infarcts. Electronically Signed   By: Jacqulynn Cadet M.D.   On: 10/09/2020 11:00   DG Chest Port 1 View  Result Date: 10/09/2020 CLINICAL DATA:  Altered mental status. History of hepatocellular carcinoma EXAM: PORTABLE CHEST 1 VIEW COMPARISON:  May 22, 2020. FINDINGS: Note that a small amount of the lateral left base not included. Central catheter tip is in the right atrium. No pneumothorax. There is no edema or airspace opacity. Heart is mildly enlarged with pulmonary vascularity normal. No adenopathy. There is aortic atherosclerosis. No bone lesions. A focus of calcification is noted in the left carotid artery. IMPRESSION: Note that a small portion of the lateral left base not included. Visualized lungs clear. Stable cardiac prominence. Central catheter tip in right atrium without pneumothorax. Aortic Atherosclerosis (ICD10-I70.0). Focus of calcification noted in left carotid artery. Electronically Signed    By: Lowella Grip III M.D.   On: 10/09/2020 09:36      IMPRESSION:   *   Hepatic encephalopathy.  Sounds like the symptoms have been building for some weeks but now acutely encephalopathic. Lactulose enemas ordered, has yet to receive. Unfortunately he also got a milligram of Ativan at 1340 today. Head CT negative.  *    Sepsis criteria positive.  Cefepime, vancomycin initiated.  *     rapid atrial fibrillation.  Cardizem drip initiated.  Anticoagulation not initiated Elevated BNP 2740, no recent echocardiogram  *     ascites.  Last paracentesis was 10/07/2020.  *    anorexia.  Intermittent nausea with limited vomiting, more often dry heaves.  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 Meld-Na 31.  Discriminant function score 34. Patient has been abstinent of alcohol since diagnosis with cirrhosis, [place] in July 2021  *     hepatocellular carcinoma treated 02/2020 with Y 92 and nivolumab. Followed by Dr. Benay Spice  *   ESRD.  Just had his first dialysis session yesterday.  *    Hyponatremia.  *    macrocytic anemia.  Hb 9.2  *     Thrombocytopenia.  Platelets 106.  *     coagulopathy.  INR 1.7.  Received 5 mg vitamin K p.o. once.    PLAN:     *   Per Dr Tarri Glenn.    *  Continue care with lactulose enemas.  Added rifaximin.  *  Pursue MRI when patient able to tolerate to restage HCC.   Azucena Freed  10/09/2020, 3:31 PM Phone [phone]

## 2020-10-09 NOTE — Telephone Encounter (Signed)
Received call from patient's wife that she has called EMS because this morning very lethargic and disoriented.  They are taking him to [facility].  I have made Dr. Benay Spice aware.

## 2020-10-09 NOTE — ED Provider Notes (Signed)
[place] EMERGENCY DEPARTMENT Provider Note   CSN: [id number] Arrival date & time: 10/09/20  0846     History No chief complaint on file.   Dillon Perkins is a 70 y.o. male.  Patient brought in by EMS.  Patient's had some mental confusion.  But this morning it was significantly worse.  Not able to answer orientation questions.  Also does not seem to be of follow commands.  To me patient will verbalize will give his name.  Will shake his head no in response to questions like abdominal pain.  Patient had his first episode hemodialysis yesterday.  April 5.  Patient has a history of liver failure and kidney failure.  Patient has a history of atrial fibrillation.  But his wife states that he does not really take his medicines.  Supposed to be on Cardizem 30 mg 3 times a day.  Patient is on Lasix and Aldactone.  Well as ProAmatine.  Patient also on Atarax.  Past medical history significant for atrial fibrillation hyperlipidemia hypertension history of kidney stones.  Gastroesophageal reflux disease hepatocellular carcinoma history of chemotherapy.  Last dose November 2021.  And history of alcohol cirrhosis.  And new onset renal failure requiring dialysis.  In addition patient not on any blood thinners.  Wife states that he was supposed to be but he refuses.  Patient is normally scheduled dialysis days will be Tuesday Thursday and Saturday.        Past Medical History:  Diagnosis Date  . Alcoholic cirrhosis ([place])   . Atrial fibrillation ([place])   . GERD (gastroesophageal reflux disease)   . Hepatocellular carcinoma ([place])   . History of chemotherapy taking immunotherapy   last dose 05-13-2020  . History of kidney stones   . Hyperlipidemia   . Hypertension     Patient Active Problem List   Diagnosis Date Noted  . Stage 3a chronic kidney disease ([place]) 09/05/2020  . Orthostatic hypotension 06/04/2020  . Kidney stones 05/22/2020  . Hepatocellular carcinoma ([place])  03/26/2020  . Goals of care, counseling/discussion 03/26/2020  . Educated about COVID-19 virus infection 11/07/2018  . Dilated cardiomyopathy ([place]) 11/07/2018  . SOB (shortness of breath) 11/07/2018  . Chest pain   . Acute renal insufficiency   . Paroxysmal atrial fibrillation ([facility])   . Bradycardia 07/22/2014  . Exercise intolerance 09/24/2012  . Abnormal CXR 09/06/2012  . Abnormal LFTs (liver function tests) 02/07/2012  . Hyperlipidemia 02/07/2012  . Atrial fibrillation ([place]) 01/31/2012  . Snoring 01/31/2012  . Essential hypertension 08/29/2010    Past Surgical History:  Procedure Laterality Date  . APPENDECTOMY  yrs ago  . CYSTOSCOPY/URETEROSCOPY/HOLMIUM LASER/STENT PLACEMENT Bilateral 05/22/2020   Procedure: CYSTOSCOPY/RETROGRADE/URETEROSCOPY/HOLMIUM LASER/STENT PLACEMENT STONE BASKET RETRIVAL ;  Surgeon: Ceasar Mons, MD;  Location: [facility];  Service: Urology;  Laterality: Bilateral;  . IR ANGIOGRAM SELECTIVE EACH ADDITIONAL VESSEL  02/19/2020  . IR ANGIOGRAM SELECTIVE EACH ADDITIONAL VESSEL  02/19/2020  . IR ANGIOGRAM SELECTIVE EACH ADDITIONAL VESSEL  02/19/2020  . IR ANGIOGRAM SELECTIVE EACH ADDITIONAL VESSEL  02/19/2020  . IR ANGIOGRAM SELECTIVE EACH ADDITIONAL VESSEL  02/19/2020  . IR ANGIOGRAM SELECTIVE EACH ADDITIONAL VESSEL  02/19/2020  . IR ANGIOGRAM SELECTIVE EACH ADDITIONAL VESSEL  03/01/2020  . IR ANGIOGRAM SELECTIVE EACH ADDITIONAL VESSEL  03/01/2020  . IR ANGIOGRAM SELECTIVE EACH ADDITIONAL VESSEL  03/01/2020  . IR ANGIOGRAM VISCERAL SELECTIVE  02/19/2020  . IR ANGIOGRAM VISCERAL SELECTIVE  03/01/2020  . IR EMBO ARTERIAL NOT HEMORR  HEMANG INC GUIDE ROADMAPPING  02/19/2020  . IR EMBO TUMOR ORGAN ISCHEMIA INFARCT INC GUIDE ROADMAPPING  02/19/2020  . IR EMBO TUMOR ORGAN ISCHEMIA INFARCT INC GUIDE ROADMAPPING  03/01/2020  . IR PARACENTESIS  07/15/2020  . IR PARACENTESIS  08/02/2020  . IR PARACENTESIS  08/19/2020  . IR PARACENTESIS  09/09/2020  . IR  PARACENTESIS  10/07/2020  . IR RADIOLOGIST EVAL & MGMT  01/30/2020  . IR US GUIDE VASC ACCESS RIGHT  02/19/2020  . IR US GUIDE VASC ACCESS RIGHT  03/01/2020       Family History  Problem Relation Age of Onset  . Heart disease Father 76       Heart attack  . Heart disease Sister 78       Heart attack  . Breast cancer Mother   . Colon cancer Neg Hx   . Stomach cancer Neg Hx   . Pancreatic cancer Neg Hx   . Rectal cancer Neg Hx   . Esophageal cancer Neg Hx     Social History   Tobacco Use  . Smoking status: Former Smoker    Packs/day: 1.00    Years: 0.50    Pack years: 0.50    Types: Cigarettes    Quit date: 08/21/1972    Years since quitting: 48.1  . Smokeless tobacco: Never Used  Vaping Use  . Vaping Use: Never used  Substance Use Topics  . Alcohol use: Not Currently    Alcohol/week: 2.0 standard drinks    Types: 2 Cans of beer per week  . Drug use: No    Home Medications Prior to Admission medications   Medication Sig Start Date End Date Taking? Authorizing Provider  Coenzyme Q10 (COQ10) 100 MG CAPS Take 100 mg by mouth daily.     [provider]  diltiazem (CARDIZEM) 30 MG tablet Take 1 tablet (30 mg total) by mouth 3 (three) times daily. Patient not taking: Reported on 09/24/2020 09/06/20   Minus Breeding, MD  furosemide (LASIX) 40 MG tablet Take 40 mg by mouth daily.    [provider]  hydrOXYzine (ATARAX/VISTARIL) 10 MG tablet Take 10 mg by mouth every 8 (eight) hours as needed.    [provider]  LORazepam (ATIVAN) 0.5 MG tablet Take 0.5-1 tablets (0.25-0.5 mg total) by mouth at bedtime as needed for anxiety or sleep. 10/02/20   Owens Shark, NP  midodrine (PROAMATINE) 10 MG tablet Take 10 mg by mouth 3 (three) times daily.    [provider]  spironolactone (ALDACTONE) 50 MG tablet TAKE ONE TABLET BY MOUTH DAILY Patient not taking: Reported on 09/24/2020 09/06/20   Ladene Artist, MD    Allergies    Losartan potassium and  Other  Review of Systems   Review of Systems  Unable to perform ROS: Mental status change    Physical Exam Updated Vital Signs BP 100/73   Pulse (!) 131   Temp (!) 96.7 F (35.9 C) (Rectal)   Resp (!) 22   Ht 1.727 m (5\' 8" )   Wt 88.3 kg   SpO2 100%   BMI 29.60 kg/m   Physical Exam Vitals and nursing note reviewed.  Constitutional:      Appearance: He is well-developed. He is ill-appearing.  HENT:     Head: Normocephalic and atraumatic.  Eyes:     General: Scleral icterus present.     Extraocular Movements: Extraocular movements intact.     Conjunctiva/sclera: Conjunctivae normal.     Pupils:  Pupils are equal, round, and reactive to light.  Cardiovascular:     Rate and Rhythm: Normal rate and regular rhythm.     Heart sounds: No murmur heard.   Pulmonary:     Effort: Pulmonary effort is normal. No respiratory distress.     Breath sounds: Normal breath sounds.     Comments: Dialysis temporary catheter right anterior chest Abdominal:     General: There is distension.     Palpations: Abdomen is soft.     Tenderness: There is no abdominal tenderness.  Musculoskeletal:        General: Normal range of motion.     Cervical back: Neck supple. No rigidity.  Skin:    General: Skin is warm and dry.     Capillary Refill: Capillary refill takes less than 2 seconds.     Comments: Icteric  Neurological:     Mental Status: He is alert.     Comments: Patient awake will shake his head no.  Will verbalize some.  Moving all 4 extremities.  Somewhat nonstop.     ED Results / Procedures / Treatments   Labs (all labs ordered are listed, but only abnormal results are displayed) Labs Reviewed  LACTIC ACID, PLASMA - Abnormal; Notable for the following components:      Result Value   Lactic Acid, Venous 4.0 (*)    All other components within normal limits  APTT - Abnormal; Notable for the following components:   aPTT 41 (*)    All other components within normal limits   PROTIME-INR - Abnormal; Notable for the following components:   Prothrombin Time 19.4 (*)    INR 1.7 (*)    All other components within normal limits  COMPREHENSIVE METABOLIC PANEL - Abnormal; Notable for the following components:   Sodium 133 (*)    CO2 21 (*)    Glucose, Bld 120 (*)    BUN 76 (*)    Creatinine, Ser 3.35 (*)    Calcium 8.7 (*)    Albumin 2.2 (*)    AST 61 (*)    ALT 46 (*)    Total Bilirubin 4.9 (*)    GFR, Estimated 19 (*)    All other components within normal limits  LIPASE, BLOOD - Abnormal; Notable for the following components:   Lipase 68 (*)    All other components within normal limits  AMMONIA - Abnormal; Notable for the following components:   Ammonia 190 (*)    All other components within normal limits  CBC WITH DIFFERENTIAL/PLATELET - Abnormal; Notable for the following components:   RBC 2.83 (*)    Hemoglobin 10.1 (*)    HCT 30.1 (*)    MCV 106.4 (*)    MCH 35.7 (*)    RDW 17.5 (*)    Platelets 106 (*)    Lymphs Abs 0.5 (*)    All other components within normal limits  CBG MONITORING, ED - Abnormal; Notable for the following components:   Glucose-Capillary 110 (*)    All other components within normal limits  RESP PANEL BY RT-PCR (FLU A&B, COVID) [id number]  URINE CULTURE  CULTURE, BLOOD (ROUTINE X 2)  CULTURE, BLOOD (ROUTINE X 2)  ETHANOL  LACTIC ACID, PLASMA  CBC WITH DIFFERENTIAL/PLATELET  URINALYSIS, ROUTINE W REFLEX MICROSCOPIC  RAPID URINE DRUG SCREEN, HOSP PERFORMED  I-STAT VENOUS BLOOD GAS, ED    EKG None   ED ECG REPORT   Date: 10/09/2020  Rate: 133  Rhythm: atrial fibrillation  QRS Axis: normal  Intervals: normal  ST/T Wave abnormalities: nonspecific T wave changes  Conduction Disutrbances:none  Narrative Interpretation:   Old EKG Reviewed: none available  I have personally reviewed the EKG tracing and agree with the computerized printout as noted.   Radiology CT Head Wo Contrast  Result Date: 10/09/2020 CLINICAL  DATA:  Neurological deficit EXAM: CT HEAD WITHOUT CONTRAST TECHNIQUE: Contiguous axial images were obtained from the base of the skull through the vertex without intravenous contrast. COMPARISON:  Prior head CT 05/29/2020 FINDINGS: Brain: No evidence of acute infarction, hemorrhage, hydrocephalus, extra-axial collection or mass lesion/mass effect. Stable scattered foci of white matter hypoattenuation consistent with chronic microvascular ischemic white matter disease. Tiny bilateral cerebellar hemisphere lacunar infarcts are also unchanged. Mild cortical atrophy without interval progression. Vascular: No hyperdense vessel or unexpected calcification. Atherosclerotic vascular calcifications in the bilateral cavernous and supraclinoid carotid arteries. Skull: Normal. Negative for fracture or focal lesion. Sinuses/Orbits: No acute finding. Other: None. IMPRESSION: 1. No acute intracranial abnormality. 2. Stable cortical atrophy, chronic microvascular ischemic white matter disease and bilateral cerebellar hemisphere lacunar infarcts. Electronically Signed   By: Jacqulynn Cadet M.D.   On: 10/09/2020 11:00   DG Chest Port 1 View  Result Date: 10/09/2020 CLINICAL DATA:  Altered mental status. History of hepatocellular carcinoma EXAM: PORTABLE CHEST 1 VIEW COMPARISON:  May 22, 2020. FINDINGS: Note that a small amount of the lateral left base not included. Central catheter tip is in the right atrium. No pneumothorax. There is no edema or airspace opacity. Heart is mildly enlarged with pulmonary vascularity normal. No adenopathy. There is aortic atherosclerosis. No bone lesions. A focus of calcification is noted in the left carotid artery. IMPRESSION: Note that a small portion of the lateral left base not included. Visualized lungs clear. Stable cardiac prominence. Central catheter tip in right atrium without pneumothorax. Aortic Atherosclerosis (ICD10-I70.0). Focus of calcification noted in left carotid artery.  Electronically Signed   By: Lowella Grip III M.D.   On: 10/09/2020 09:36    Procedures Procedures   CRITICAL CARE Performed by: Fredia Sorrow Total critical care time: 60 minutes Critical care time was exclusive of separately billable procedures and treating other patients. Critical care was necessary to treat or prevent imminent or life-threatening deterioration. Critical care was time spent personally by me on the following activities: development of treatment plan with patient and/or surrogate as well as nursing, discussions with consultants, evaluation of patient's response to treatment, examination of patient, obtaining history from patient or surrogate, ordering and performing treatments and interventions, ordering and review of laboratory studies, ordering and review of radiographic studies, pulse oximetry and re-evaluation of patient's condition.   Medications Ordered in ED Medications  diltiazem (CARDIZEM) 125 mg in dextrose 5% 125 mL (1 mg/mL) infusion (7.5 mg/hr Intravenous Rate/Dose Verify 10/09/20 1045)  lactated ringers bolus 500 mL (has no administration in time range)  lactated ringers infusion (has no administration in time range)  lactated ringers infusion (has no administration in time range)  lactated ringers bolus 1,000 mL (0 mLs Intravenous Stopped 10/09/20 1140)    And  lactated ringers bolus 1,000 mL (1,000 mLs Intravenous New Bag/Given 10/09/20 1146)    And  lactated ringers bolus 1,000 mL (has no administration in time range)  ceFEPIme (MAXIPIME) 2 g in sodium chloride 0.9 % 100 mL IVPB (has no administration in time range)  vancomycin (VANCOREADY) IVPB 2000 mg/400 mL (2,000 mg Intravenous New Bag/Given 10/09/20 1156)  vancomycin (VANCOREADY) IVPB 1250 mg/250 mL (  has no administration in time range)  ceFEPIme (MAXIPIME) 2 g in sodium chloride 0.9 % 100 mL IVPB (0 g Intravenous Stopped 10/09/20 1140)  metroNIDAZOLE (FLAGYL) IVPB 500 mg (0 mg Intravenous Stopped  10/09/20 1154)  ondansetron (ZOFRAN) injection 4 mg (4 mg Intravenous Given 10/09/20 1042)    ED Course  I have reviewed the triage vital signs and the nursing notes.  Pertinent labs & imaging results that were available during my care of the patient were reviewed by me and considered in my medical decision making (see chart for details).    MDM Rules/Calculators/A&P                          Patient upon presentation atrial fibrillation has a history of atrial fibrillation.  Noncompliant with his meds not on blood thinners although he supposed to be.  Patient's heart rate was in the 130s 140s.  Was not clear due to the altered mental status whether this may have been sepsis.  His temperature was down some and his lactic acid came back at 4 so sepsis protocol was initiated.  Patient is a dialysis patient did have his first dialysis yesterday.  Patient however did receive a 30 cc/kg fluid.  Does not seem to make much change in his heart rate.  Patient was started on diltiazem drip which was titrated slowly.  Eventually were able to get his heart rate down to around 108.  His ammonia level came back elevated.  Venous blood gas had no significant acidosis.  Feel that patient's altered mental status predominantly probably related to the elevated ammonia level.  However patient was treated with broad-spectrum antibiotics.  Urinalysis was negative for infection chest x-ray negative for infection abdomen without any acute changes.  Although he did have ascites but abdomen appeared to be nontender.  Patient without any significant leukocytosis.  Discussed with the hospitalist admitting team.  And also discussed with nephrology.  Thinking that patient would normally be dialyzed tomorrow his days are normally Tuesday Thursdays and Saturday.  Patient's labs were delayed which resulted in some delay in admission.  But clinically I suspect his altered mental status is mostly related to the elevated ammonia level.   And lactulose was ordered.  Final Clinical Impression(s) / ED Diagnoses Final diagnoses:  Altered mental status, unspecified altered mental status type  ESRD on dialysis [facility])  Acute metabolic encephalopathy  Atrial fibrillation with [facility])    Rx / DC Orders ED Discharge Orders    None       Fredia Sorrow, MD 10/09/20 1655

## 2020-10-09 NOTE — Sepsis Progress Note (Addendum)
Notified provider of need to order repeat lactic acid. ° °

## 2020-10-09 NOTE — ED Notes (Signed)
Pt returned from CT °

## 2020-10-09 NOTE — Sepsis Progress Note (Signed)
Code sepsis prototocl being monitored by [employer]

## 2020-10-09 NOTE — ED Notes (Signed)
Critical lactic reported to Dr. Rogene Houston

## 2020-10-10 ENCOUNTER — Other Ambulatory Visit: Payer: Medicare Other

## 2020-10-10 ENCOUNTER — Inpatient Hospital Stay: Payer: Medicare Other | Attending: Oncology

## 2020-10-10 ENCOUNTER — Inpatient Hospital Stay (HOSPITAL_COMMUNITY): Payer: Medicare Other

## 2020-10-10 ENCOUNTER — Ambulatory Visit: Payer: Medicare Other | Admitting: Oncology

## 2020-10-10 ENCOUNTER — Other Ambulatory Visit: Payer: Self-pay

## 2020-10-10 ENCOUNTER — Encounter (HOSPITAL_COMMUNITY): Payer: Self-pay | Admitting: Internal Medicine

## 2020-10-10 ENCOUNTER — Ambulatory Visit: Payer: Medicare Other

## 2020-10-10 DIAGNOSIS — F1021 Alcohol dependence, in remission: Secondary | ICD-10-CM

## 2020-10-10 DIAGNOSIS — I4891 Unspecified atrial fibrillation: Secondary | ICD-10-CM

## 2020-10-10 DIAGNOSIS — K7031 Alcoholic cirrhosis of liver with ascites: Secondary | ICD-10-CM | POA: Diagnosis not present

## 2020-10-10 DIAGNOSIS — K767 Hepatorenal syndrome: Secondary | ICD-10-CM | POA: Diagnosis not present

## 2020-10-10 DIAGNOSIS — R5381 Other malaise: Secondary | ICD-10-CM

## 2020-10-10 DIAGNOSIS — G4751 Confusional arousals: Secondary | ICD-10-CM

## 2020-10-10 DIAGNOSIS — K7290 Hepatic failure, unspecified without coma: Secondary | ICD-10-CM | POA: Diagnosis not present

## 2020-10-10 DIAGNOSIS — C220 Liver cell carcinoma: Secondary | ICD-10-CM | POA: Diagnosis not present

## 2020-10-10 DIAGNOSIS — R161 Splenomegaly, not elsewhere classified: Secondary | ICD-10-CM

## 2020-10-10 DIAGNOSIS — R4182 Altered mental status, unspecified: Secondary | ICD-10-CM | POA: Diagnosis not present

## 2020-10-10 DIAGNOSIS — E440 Moderate protein-calorie malnutrition: Secondary | ICD-10-CM | POA: Insufficient documentation

## 2020-10-10 DIAGNOSIS — Z87891 Personal history of nicotine dependence: Secondary | ICD-10-CM

## 2020-10-10 DIAGNOSIS — R630 Anorexia: Secondary | ICD-10-CM

## 2020-10-10 DIAGNOSIS — Z992 Dependence on renal dialysis: Secondary | ICD-10-CM

## 2020-10-10 DIAGNOSIS — N289 Disorder of kidney and ureter, unspecified: Secondary | ICD-10-CM

## 2020-10-10 DIAGNOSIS — R7989 Other specified abnormal findings of blood chemistry: Secondary | ICD-10-CM

## 2020-10-10 DIAGNOSIS — K7460 Unspecified cirrhosis of liver: Secondary | ICD-10-CM

## 2020-10-10 DIAGNOSIS — I8390 Asymptomatic varicose veins of unspecified lower extremity: Secondary | ICD-10-CM

## 2020-10-10 DIAGNOSIS — R319 Hematuria, unspecified: Secondary | ICD-10-CM

## 2020-10-10 HISTORY — PX: IR PARACENTESIS: IMG2679

## 2020-10-10 LAB — CBC WITH DIFFERENTIAL/PLATELET
Abs Immature Granulocytes: 0.01 10*3/uL (ref 0.00–0.07)
Basophils Absolute: 0 10*3/uL (ref 0.0–0.1)
Basophils Relative: 0 %
Eosinophils Absolute: 0.1 10*3/uL (ref 0.0–0.5)
Eosinophils Relative: 2 %
HCT: 26.6 % — ABNORMAL LOW (ref 39.0–52.0)
Hemoglobin: 9.1 g/dL — ABNORMAL LOW (ref 13.0–17.0)
Immature Granulocytes: 0 %
Lymphocytes Relative: 9 %
Lymphs Abs: 0.6 10*3/uL — ABNORMAL LOW (ref 0.7–4.0)
MCH: 36.3 pg — ABNORMAL HIGH (ref 26.0–34.0)
MCHC: 34.2 g/dL (ref 30.0–36.0)
MCV: 106 fL — ABNORMAL HIGH (ref 80.0–100.0)
Monocytes Absolute: 0.6 10*3/uL (ref 0.1–1.0)
Monocytes Relative: 8 %
Neutro Abs: 5.5 10*3/uL (ref 1.7–7.7)
Neutrophils Relative %: 81 %
Platelets: 91 10*3/uL — ABNORMAL LOW (ref 150–400)
RBC: 2.51 MIL/uL — ABNORMAL LOW (ref 4.22–5.81)
RDW: 17.7 % — ABNORMAL HIGH (ref 11.5–15.5)
WBC: 6.9 10*3/uL (ref 4.0–10.5)
nRBC: 0 % (ref 0.0–0.2)

## 2020-10-10 LAB — FERRITIN: Ferritin: 943 ng/mL — ABNORMAL HIGH (ref 24–336)

## 2020-10-10 LAB — COMPREHENSIVE METABOLIC PANEL
ALT: 44 U/L (ref 0–44)
AST: 85 U/L — ABNORMAL HIGH (ref 15–41)
Albumin: 2.8 g/dL — ABNORMAL LOW (ref 3.5–5.0)
Alkaline Phosphatase: 85 U/L (ref 38–126)
Anion gap: 11 (ref 5–15)
BUN: 78 mg/dL — ABNORMAL HIGH (ref 8–23)
CO2: 21 mmol/L — ABNORMAL LOW (ref 22–32)
Calcium: 9.2 mg/dL (ref 8.9–10.3)
Chloride: 104 mmol/L (ref 98–111)
Creatinine, Ser: 3.41 mg/dL — ABNORMAL HIGH (ref 0.61–1.24)
GFR, Estimated: 18 mL/min — ABNORMAL LOW (ref >60)
Glucose, Bld: 107 mg/dL — ABNORMAL HIGH (ref 70–99)
Potassium: 4.6 mmol/L (ref 3.5–5.1)
Sodium: 136 mmol/L (ref 135–145)
Total Bilirubin: 6.4 mg/dL — ABNORMAL HIGH (ref 0.3–1.2)
Total Protein: 6.7 g/dL (ref 6.5–8.1)

## 2020-10-10 LAB — BLOOD CULTURE ID PANEL (REFLEXED) - BCID2

## 2020-10-10 LAB — ALBUMIN, PLEURAL OR PERITONEAL FLUID: Albumin, Fluid: <1 g/dL

## 2020-10-10 LAB — IRON AND TIBC
Iron: 123 ug/dL (ref 45–182)
Saturation Ratios: 99 % — ABNORMAL HIGH (ref 17.9–39.5)
TIBC: 125 ug/dL — ABNORMAL LOW (ref 250–450)
UIBC: 2 ug/dL

## 2020-10-10 LAB — BODY FLUID CELL COUNT WITH DIFFERENTIAL
Eos, Fluid: 0 %
Lymphs, Fluid: 51 %
Monocyte-Macrophage-Serous Fluid: 29 % — ABNORMAL LOW (ref 50–90)
Neutrophil Count, Fluid: 20 % (ref 0–25)
Total Nucleated Cell Count, Fluid: 109 cu mm (ref 0–1000)

## 2020-10-10 LAB — LACTATE DEHYDROGENASE, PLEURAL OR PERITONEAL FLUID: LD, Fluid: 40 U/L — ABNORMAL HIGH (ref 3–23)

## 2020-10-10 LAB — AMMONIA: Ammonia: 79 umol/L — ABNORMAL HIGH (ref 9–35)

## 2020-10-10 LAB — PROTIME-INR
INR: 1.9 — ABNORMAL HIGH (ref 0.8–1.2)
Prothrombin Time: 21 seconds — ABNORMAL HIGH (ref 11.4–15.2)

## 2020-10-10 LAB — URINE CULTURE: Culture: NO GROWTH

## 2020-10-10 LAB — GLUCOSE, PLEURAL OR PERITONEAL FLUID: Glucose, Fluid: 106 mg/dL

## 2020-10-10 LAB — PHOSPHORUS: Phosphorus: 3.3 mg/dL (ref 2.5–4.6)

## 2020-10-10 LAB — MRSA PCR SCREENING: MRSA by PCR: NEGATIVE

## 2020-10-10 MED ORDER — ALBUMIN HUMAN 25 % IV SOLN
25.0000 g | Freq: Four times a day (QID) | INTRAVENOUS | Status: DC
Start: 2020-10-10 — End: 2020-10-11
  Administered 2020-10-10 – 2020-10-11 (×4): 25 g via INTRAVENOUS
  Filled 2020-10-10 (×4): qty 100

## 2020-10-10 MED ORDER — BOOST / RESOURCE BREEZE PO LIQD CUSTOM
1.0000 | Freq: Three times a day (TID) | ORAL | Status: DC
  Administered 2020-10-11 – 2020-10-13 (×3): 1 via ORAL

## 2020-10-10 MED ORDER — PROSOURCE PLUS PO LIQD
30.0000 mL | Freq: Two times a day (BID) | ORAL | Status: DC
  Administered 2020-10-11 – 2020-10-14 (×6): 30 mL via ORAL
  Filled 2020-10-10 (×8): qty 30

## 2020-10-10 MED ORDER — DARBEPOETIN ALFA 60 MCG/0.3ML IJ SOSY
60.0000 ug | PREFILLED_SYRINGE | INTRAMUSCULAR | Status: DC
  Filled 2020-10-10: qty 0.3

## 2020-10-10 MED ORDER — RENA-VITE PO TABS
1.0000 | ORAL_TABLET | Freq: Every day | ORAL | Status: DC
  Administered 2020-10-10 – 2020-10-13 (×4): 1 via ORAL
  Filled 2020-10-10 (×4): qty 1

## 2020-10-10 MED ORDER — LIDOCAINE HCL 1 % IJ SOLN
INTRAMUSCULAR | Status: AC
  Filled 2020-10-10: qty 20

## 2020-10-10 MED ORDER — LACTULOSE 10 GM/15ML PO SOLN
20.0000 g | Freq: Three times a day (TID) | ORAL | Status: DC
  Administered 2020-10-11 – 2020-10-14 (×8): 20 g via ORAL
  Filled 2020-10-10 (×8): qty 30

## 2020-10-10 MED ORDER — ALBUMIN HUMAN 25 % IV SOLN
0.0000 g | Freq: Once | INTRAVENOUS | Status: AC
  Administered 2020-10-10: 50 g via INTRAVENOUS
  Filled 2020-10-10 (×2): qty 200

## 2020-10-10 MED ORDER — LIDOCAINE HCL (PF) 1 % IJ SOLN
INTRAMUSCULAR | Status: AC | PRN
  Administered 2020-10-10: 10 mL

## 2020-10-10 MED ORDER — VITAMIN K1 10 MG/ML IJ SOLN
5.0000 mg | Freq: Every day | INTRAVENOUS | Status: AC
  Administered 2020-10-10 – 2020-10-12 (×3): 5 mg via INTRAVENOUS
  Filled 2020-10-10 (×3): qty 0.5

## 2020-10-10 MED ORDER — SODIUM CHLORIDE 0.9 % IV SOLN
1.0000 g | INTRAVENOUS | Status: DC
  Administered 2020-10-11 – 2020-10-13 (×3): 1 g via INTRAVENOUS
  Filled 2020-10-10 (×4): qty 1

## 2020-10-10 MED ORDER — CHLORHEXIDINE GLUCONATE CLOTH 2 % EX PADS
6.0000 | MEDICATED_PAD | Freq: Every day | CUTANEOUS | Status: DC
  Administered 2020-10-10 – 2020-10-13 (×4): 6 via TOPICAL

## 2020-10-10 MED ORDER — HEPARIN SODIUM (PORCINE) 1000 UNIT/ML IJ SOLN
INTRAMUSCULAR | Status: AC
  Filled 2020-10-10: qty 1

## 2020-10-10 MED ORDER — ENSURE ENLIVE PO LIQD
237.0000 mL | Freq: Three times a day (TID) | ORAL | Status: DC
  Administered 2020-10-11 – 2020-10-14 (×7): 237 mL via ORAL

## 2020-10-10 MED ORDER — ALBUMIN HUMAN 5 % IV SOLN
50.0000 g | Freq: Once | INTRAVENOUS | Status: DC
  Filled 2020-10-10: qty 1000

## 2020-10-10 NOTE — Progress Notes (Addendum)
Daily Rounding Note  10/10/2020, 11:45 AM  LOS: 1 day   SUBJECTIVE:   Chief complaint: Hepatic encephalopathy  Remains encephalopathic but less agitated than yesterday afternoon.  Wearing a mitten restraints for line protection.  Flexi-Seal in place.  Currently receiving hemodialysis.  Blood pressure soft, 90s over 60s.  Heart rate high 80s to mid 90s. IV albumin infusions scheduled through tomorrow. Cefepime in place. Cardizem drip continues.  OBJECTIVE:         Vital signs in last 24 hours:    Temp:  [97 F (36.1 C)-97.8 F (36.6 C)] 97.3 F (36.3 C) (04/07 0910) Pulse Rate:  [70-141] 92 (04/07 1130) Resp:  [13-26] 18 (04/07 0910) BP: (90-115)/(53-98) 97/66 (04/07 1130) SpO2:  [96 %-100 %] 97 % (04/07 0910) Weight:  [86.8 kg] 86.8 kg (04/07 0910) Last BM Date: 10/10/20 Filed Weights   10/09/20 0857 10/10/20 0910  Weight: 88.3 kg 86.8 kg   General: Somewhat obtunded but more alert today.  Looks chronically and acutely ill. Heart: Slightly tacky, regular. Chest: Clear.  Regular breathing rhythm. Abdomen: 80 to 90 cc of dark brown, nonbloody watery stool in Flexi-Seal.  Abdomen protuberant but soft and nontender.  Active bowel sounds. Extremities: 1+ lower extremity edema. Neuro/Psych: Follows some very simple commands erratically.  For the most part not following commands.  Moves all 4 limbs.  Intake/Output from previous day: 04/06 0701 - 04/07 0700 In: 4187.5 [I.V.:587.5; IV Piggyback:3600] Out: 680 [Urine:200; Stool:480]  Intake/Output this shift: No intake/output data recorded.  Lab Results: Recent Labs    10/09/20 1055 10/09/20 1412 10/09/20 2023 10/10/20 0547  WBC 7.2  --  7.1 6.9  HGB 10.1* 9.2* 9.9* 9.1*  HCT 30.1* 27.0* 28.7* 26.6*  PLT 106*  --  99* 91*   BMET Recent Labs    10/09/20 1055 10/09/20 1412 10/09/20 2023 10/10/20 0547  NA 133* 136 133* 136  K 4.9 5.0 4.8 4.6  CL 102  --   100 104  CO2 21*  --  21* 21*  GLUCOSE 120*  --  113* 107*  BUN 76*  --  74* 78*  CREATININE 3.35*  --  3.21* 3.41*  CALCIUM 8.7*  --  8.7* 9.2   LFT Recent Labs    10/09/20 1055 10/09/20 2023 10/10/20 0547  PROT 7.2  --  6.7  ALBUMIN 2.2* 2.1* 2.8*  AST 61*  --  85*  ALT 46*  --  44  ALKPHOS 106  --  85  BILITOT 4.9*  --  6.4*   PT/INR Recent Labs    10/09/20 1042 10/10/20 0547  LABPROT 19.4* 21.0*  INR 1.7* 1.9*   Hepatitis Panel No results for input(s): HEPBSAG, HCVAB, HEPAIGM, HEPBIGM in the last 72 hours.  Studies/Results: CT Abdomen Pelvis Wo Contrast  Result Date: 10/09/2020 CLINICAL DATA:  Abdominal distension. 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 COMPARISON:  MRI from 07/04/2020 FINDINGS: Lower chest: Trace bilateral pleural effusions identified. Hepatobiliary: The liver appears cirrhotic. Treated tumor within segment 2 is measures 5.3 x 4.3 cm on today's exam, image 17/3. This is compared with 5.0 x 4.6 cm previously. The other known liver lesions are difficult to assess reflecting lack of IV contrast material. Sludge and tiny stones identified within the dependent portion of the gallbladder.  Pancreas: Unremarkable. No pancreatic ductal dilatation or surrounding inflammatory changes. Spleen: Normal in size without focal abnormality. Adrenals/Urinary Tract: Normal appearance of the adrenal glands. Small bilateral renal calculi identified. No hydronephrosis. Urinary bladder is unremarkable. Stomach/Bowel: The stomach appears nondistended. Small bowel loops are unremarkable. Mild diffuse colonic wall thickening is nonspecific in the setting of ascites and portal venous hypertension. Vascular/Lymphatic: Aortic atherosclerosis. No aneurysm. Previous GDA embolization.  Esophageal and upper abdominal varices. No abdominopelvic adenopathy. Reproductive: Prostate is unremarkable. Other: Large volume of ascites within the abdomen and pelvis. A left inguinal hernia is identified which is filled with ascites as well as a nonobstructed loop of sigmoid colon. No discrete fluid collections identified. Musculoskeletal: Bilateral L5 pars defects identified. Mild anterolisthesis of L5 on S1 noted. IMPRESSION: 1. Large volume of ascites identified within the abdomen and pelvis. 2. Cirrhosis with known multifocal hepatocellular carcinoma. Status post Y 90 microsphere ablation. Dominant lesion in segment 2 appears similar to 07/04/2020 3.  Aortic Atherosclerosis (ICD10-I70.0). 4. Gallstones. 5. Trace pleural effusions. Electronically Signed   By: Kerby Moors M.D.   On: 10/09/2020 13:49   CT Head Wo Contrast  Result Date: 10/09/2020 CLINICAL DATA:  Neurological deficit EXAM: CT HEAD WITHOUT CONTRAST TECHNIQUE: Contiguous axial images were obtained from the base of the skull through the vertex without intravenous contrast. COMPARISON:  Prior head CT 05/29/2020 FINDINGS: Brain: No evidence of acute infarction, hemorrhage, hydrocephalus, extra-axial collection or mass lesion/mass effect. Stable scattered foci of white matter hypoattenuation consistent with chronic microvascular ischemic white matter disease. Tiny bilateral cerebellar hemisphere lacunar infarcts are also unchanged. Mild cortical atrophy without interval progression. Vascular: No hyperdense vessel or unexpected calcification. Atherosclerotic vascular calcifications in the bilateral cavernous and supraclinoid carotid arteries. Skull: Normal. Negative for fracture or focal lesion. Sinuses/Orbits: No acute finding. Other: None. IMPRESSION: 1. No acute intracranial abnormality. 2. Stable cortical atrophy, chronic microvascular ischemic white matter disease and bilateral cerebellar hemisphere lacunar infarcts. Electronically Signed    By: Jacqulynn Cadet M.D.   On: 10/09/2020 11:00   DG Chest Port 1 View  Result Date: 10/09/2020 CLINICAL DATA:  Altered mental status. History of hepatocellular carcinoma EXAM: PORTABLE CHEST 1 VIEW COMPARISON:  May 22, 2020. FINDINGS: Note that a small amount of the lateral left base not included. Central catheter tip is in the right atrium. No pneumothorax. There is no edema or airspace opacity. Heart is mildly enlarged with pulmonary vascularity normal. No adenopathy. There is aortic atherosclerosis. No bone lesions. A focus of calcification is noted in the left carotid artery. IMPRESSION: Note that a small portion of the lateral left base not included. Visualized lungs clear. Stable cardiac prominence. Central catheter tip in right atrium without pneumothorax. Aortic Atherosclerosis (ICD10-I70.0). Focus of calcification noted in left carotid artery. Electronically Signed   By: Lowella Grip III M.D.   On: 10/09/2020 09:36   US LIVER DOPPLER  Result Date: 10/10/2020 CLINICAL DATA:  Cirrhosis of liver with ascites EXAM: DUPLEX ULTRASOUND OF LIVER TECHNIQUE: Color and duplex Doppler ultrasound was performed to evaluate the hepatic in-flow and out-flow vessels. COMPARISON:  CT abdomen/pelvis yesterday FINDINGS: Liver: Diffusely nodular liver contour. The liver appears small in the parenchyma is diffusely heterogeneous and echogenic. Hypoechoic soft tissue mass in the left upper lobe measures 4.8 x 4.4 x 5.0 cm. Hypoechoic solid mass in the right hepatic lobe measures approximately 4.9 x 5.7 x 8.3 cm. Diffusely thickened gallbladder wall. Sludge is present within the gallbladder lumen. The common bile duct is normal  at 3 mm. Main Portal Vein size: 1.2 cm Portal Vein Velocities Main Prox:  41 cm/sec with hepatopetal directional flow. Main Mid: 55 cm/sec with hepatopetal directional flow. Main Dist:  71 cm/sec with hepatopetal directional flow. Right: Unable to visualize Left: 28 cm/sec with  hepatopetal directional flow. Hepatic Vein Velocities Right:  27 cm/sec Middle:  32 cm/sec Left:  68 cm/sec IVC: Present and patent with normal respiratory phasicity. Hepatic Artery Velocity:  109 cm/sec Splenic Vein Velocity:  34 cm/sec Spleen: 8.4 cm x 12.0 cm x 8.2 cm with a total volume of 436 cm^3 (411 cm^3 is upper limit normal) Portal Vein Occlusion/Thrombus: No Splenic Vein Occlusion/Thrombus: No Ascites: None Varices: Moderate ascites. IMPRESSION: 1. 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 2. Portal veins remain patent with hepatopetal directional flow. 3. Previously treated left hepatic mass measures approximately 4.8 x 4.4 x 5.0 cm. Viability of the lesion cannot be assessed with ultrasound. 4. Right hepatic mass measuring approximately 4.9 x 5.7 x 8.3 cm not identified on prior imaging. Recommend further evaluation with gadolinium-enhanced MRI of the abdomen when the patient's clinical status is improved, preferably as an outpatient to facilitate optimal imaging quality and diagnostic sensitivity. Electronically Signed   By: Jacqulynn Cadet M.D.   On: 10/10/2020 07:45   Scheduled Meds: . Chlorhexidine Gluconate Cloth  6 each Topical Q0600  . lactulose  300 mL Rectal BID  . midodrine  10 mg Oral TID  . phytonadione  5 mg Oral Once  . rifaximin  550 mg Oral BID   Continuous Infusions: . sodium chloride    . sodium chloride    . albumin human    . albumin human 25 g (10/10/20 0828)  . albumin human    . ceFEPime (MAXIPIME) IV    . diltiazem (CARDIZEM) infusion 10 mg/hr (10/09/20 2320)   PRN Meds:.sodium chloride, sodium chloride, alteplase, haloperidol lactate, heparin, hydrOXYzine, lidocaine (PF), lidocaine-prilocaine, ondansetron **OR** ondansetron (ZOFRAN) IV, oxazepam, pentafluoroprop-tetrafluoroeth  ASSESMENT:    *   Acute hepatic encephalopathy.  Sounds like the symptoms have been building for some weeks but now acutely  encephalopathic. Lactulose enemas bid, rifaximin Head CT negative.  *    Sepsis criteria positive.  Cefepime, vancomycin initiated.  *     rapid atrial fibrillation.  Cardizem drip initiated.  Anticoagulation not initiated Elevated BNP 2740, no recent echocardiogram  *     ascites.  Last paracentesis was 10/07/2020.  *    anorexia.  Intermittent nausea with limited vomiting, more often dry heaves.  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 Meld-Na 31.  Discriminant function score 34. Patient has been abstinent of alcohol since diagnosis with cirrhosis, [place] in July 2021  *     hepatocellular carcinoma treated 02/2020 with Y 92 and nivolumab. Noncontrast CTAP 10/09/2020: Large amount of ascites in the abdomen, pelvis.  Cirrhosis.  Multifocal hepatocellular carcinoma.  Dominant lesion in segment 2 similar to imaging in 06/2020.  Unable to assess other lesions due to lack of IV contrast.  Esophageal and upper abdominal varices.  Nonspecific mild, diffuse colon wall thickening.  GB sludge, tiny stones.  Trace bilateral pleural effusions. Followed by Dr. Benay Spice  *    Esophageal, upper abdominal varices. 03/2020 EGD: Grade 2 esophageal varices in mid to distal esophagus.  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  Severe portal hypertensive gastropathy without gastric varices.  Small HH.   *  ESRD.  Just had his first dialysis session yesterday.  *    Hyponatremia.  *    macrocytic anemia.  Hb 9.1  *     Thrombocytopenia.  Platelets 91 K.  *     coagulopathy.  INR 1.7 >> 5 mg po Vit K >> 1.9.    PLAN   *   3 d of IV vit K  *  Added po lactulose.  20 g p.o. tid.   If mental status allows would also allow clear trays.  *  at some point in the near future warrants repeat EGD.    Azucena Freed  10/10/2020, 11:45 AM Phone [phone]    Attending Physician Note   I have taken an interval history, reviewed the chart  and examined the patient. I agree with the Advanced Practitioner's note, impression and recommendations. Discussed his condition and care with this wife at beside.    Decompensated alcoholic cirrhosis with HCC, ascites and new HE which has improved: Patient is less agitated, more alert today and ammonia has decrease to 79. Continue lactulose enemas and xifaxan. Add PO lactulose when mental status improves. MELD-Na=31.  Worsening coagulopathy: Vit K for 3 days   ESRD, HD today  Lucio Edward, MD [facility] [phone]

## 2020-10-10 NOTE — Progress Notes (Signed)
Pharmacy Antibiotic Note  Dillon Perkins is a 70 y.o. male admitted on 10/09/2020 with intra-abdominal infection.  Pharmacy has been consulted for cefepime dosing. Vancomycin being added with history of staph hominis bacteremia.  He is noted with ESRD with first HD on 4/5 and last HD on 4/7 (3 hours for BFR 250).  -blood cultures show GPC/clusters in 14 bottles (possible contaminant) -vancomycin 2000mg  IV given 4/6   Plan: Continue Cefepime 2g IV q24h No vancomycin needed today with shorted HD 4/7 and low flow rate Will plan to check a random vancomycin level post HD on 4/8    Height: 5\' 8"  (172.7 cm) Weight: 86.8 kg (191 lb 5.8 oz) IBW/kg (Calculated) : 68.4  Temp (24hrs), Avg:97.5 F (36.4 C), Min:97 F (36.1 C), Max:97.8 F (36.6 C)  Recent Labs  Lab 10/09/20 0924 10/09/20 1055 10/09/20 1335 10/09/20 2023 10/10/20 0547  WBC  --  7.2  --  7.1 6.9  CREATININE  --  3.35*  --  3.21* 3.41*  LATICACIDVEN 4.0*  --  7.9* 3.0*  --     Estimated Creatinine Clearance: 21.3 mL/min (A) (by C-G formula based on SCr of 3.41 mg/dL (H)).    Allergies  Allergen Reactions  . Losartan Potassium Other (See Comments)  . Other Other (See Comments)  . Xarelto [Rivaroxaban] Other (See Comments)    gross hematuria    Antimicrobials this admission: 4/6 vancomycin >>  4/6 cefepime >>    Dose adjustments this admission:   Microbiology results: 4/6 urine- neg 4/6 blood x2- GPC/clusters 1/4   Hildred Laser, PharmD Clinical Pharmacist **Pharmacist phone directory can now be found on [email or web address] ([email or web address]).  Listed under [place].

## 2020-10-10 NOTE — Progress Notes (Addendum)
[facility] Progress Note   Subjective:  Seen on HD - remains confused and in mittens. 0.5L UFG and tolerating.  Objective Vitals:   10/10/20 0930 10/10/20 1000 10/10/20 1030 10/10/20 1100  BP: 107/73 (!) 90/53 96/63 101/62  Pulse: 96 88 97   Resp:      Temp:      TempSrc:      SpO2:      Weight:      Height:       Physical Exam General: Ill appearing man, remains confused/obtunded. Hands in soft restraints/mittens. Heart: RRR; no murmur Lungs: CTA anteriorly Abdomen: soft, distended, non-tender Extremities: 1+ LE edema Dialysis Access: R [facility]  Additional Objective Labs: Basic Metabolic Panel: Recent Labs  Lab 10/09/20 1055 10/09/20 1412 10/09/20 2023 10/10/20 0547  NA 133* 136 133* 136  K 4.9 5.0 4.8 4.6  CL 102  --  100 104  CO2 21*  --  21* 21*  GLUCOSE 120*  --  113* 107*  BUN 76*  --  74* 78*  CREATININE 3.35*  --  3.21* 3.41*  CALCIUM 8.7*  --  8.7* 9.2  PHOS  --   --  4.9*  --    Liver Function Tests: Recent Labs  Lab 10/09/20 1055 10/09/20 2023 10/10/20 0547  AST 61*  --  85*  ALT 46*  --  44  ALKPHOS 106  --  85  BILITOT 4.9*  --  6.4*  PROT 7.2  --  6.7  ALBUMIN 2.2* 2.1* 2.8*   Recent Labs  Lab 10/09/20 1055  LIPASE 68*   CBC: Recent Labs  Lab 10/09/20 1055 10/09/20 1412 10/09/20 2023 10/10/20 0547  WBC 7.2  --  7.1 6.9  NEUTROABS 6.2  --   --  5.5  HGB 10.1* 9.2* 9.9* 9.1*  HCT 30.1* 27.0* 28.7* 26.6*  MCV 106.4*  --  104.7* 106.0*  PLT 106*  --  99* 91*   Studies/Results: CT Abdomen Pelvis Wo Contrast  Result Date: 10/09/2020 CLINICAL DATA:  Abdominal distension. 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 COMPARISON:  MRI from 07/04/2020 FINDINGS: Lower chest: Trace bilateral pleural effusions identified.  Hepatobiliary: The liver appears cirrhotic. Treated tumor within segment 2 is measures 5.3 x 4.3 cm on today's exam, image 17/3. This is compared with 5.0 x 4.6 cm previously. The other known liver lesions are difficult to assess reflecting lack of IV contrast material. Sludge and tiny stones identified within the dependent portion of the gallbladder. Pancreas: Unremarkable. No pancreatic ductal dilatation or surrounding inflammatory changes. Spleen: Normal in size without focal abnormality. Adrenals/Urinary Tract: Normal appearance of the adrenal glands. Small bilateral renal calculi identified. No hydronephrosis. Urinary bladder is unremarkable. Stomach/Bowel: The stomach appears nondistended. Small bowel loops are unremarkable. Mild diffuse colonic wall thickening is nonspecific in the setting of ascites and portal venous hypertension. Vascular/Lymphatic: Aortic atherosclerosis. No aneurysm. Previous GDA embolization. Esophageal and upper abdominal varices. No abdominopelvic adenopathy. Reproductive: Prostate is unremarkable. Other: Large volume of ascites within the abdomen and pelvis. A left inguinal hernia is identified which is filled with ascites as well as a nonobstructed loop of sigmoid colon. No discrete fluid collections identified. Musculoskeletal: Bilateral L5 pars defects identified. Mild anterolisthesis of L5 on S1 noted. IMPRESSION: 1. Large volume of ascites identified within the abdomen and pelvis.  2. Cirrhosis with known multifocal hepatocellular carcinoma. Status post Y 90 microsphere ablation. Dominant lesion in segment 2 appears similar to 07/04/2020 3.  Aortic Atherosclerosis (ICD10-I70.0). 4. Gallstones. 5. Trace pleural effusions. Electronically Signed   By: Kerby Moors M.D.   On: 10/09/2020 13:49   CT Head Wo Contrast  Result Date: 10/09/2020 CLINICAL DATA:  Neurological deficit EXAM: CT HEAD WITHOUT CONTRAST TECHNIQUE: Contiguous axial images were obtained from the base of the skull  through the vertex without intravenous contrast. COMPARISON:  Prior head CT 05/29/2020 FINDINGS: Brain: No evidence of acute infarction, hemorrhage, hydrocephalus, extra-axial collection or mass lesion/mass effect. Stable scattered foci of white matter hypoattenuation consistent with chronic microvascular ischemic white matter disease. Tiny bilateral cerebellar hemisphere lacunar infarcts are also unchanged. Mild cortical atrophy without interval progression. Vascular: No hyperdense vessel or unexpected calcification. Atherosclerotic vascular calcifications in the bilateral cavernous and supraclinoid carotid arteries. Skull: Normal. Negative for fracture or focal lesion. Sinuses/Orbits: No acute finding. Other: None. IMPRESSION: 1. No acute intracranial abnormality. 2. Stable cortical atrophy, chronic microvascular ischemic white matter disease and bilateral cerebellar hemisphere lacunar infarcts. Electronically Signed   By: Jacqulynn Cadet M.D.   On: 10/09/2020 11:00   DG Chest Port 1 View  Result Date: 10/09/2020 CLINICAL DATA:  Altered mental status. History of hepatocellular carcinoma EXAM: PORTABLE CHEST 1 VIEW COMPARISON:  May 22, 2020. FINDINGS: Note that a small amount of the lateral left base not included. Central catheter tip is in the right atrium. No pneumothorax. There is no edema or airspace opacity. Heart is mildly enlarged with pulmonary vascularity normal. No adenopathy. There is aortic atherosclerosis. No bone lesions. A focus of calcification is noted in the left carotid artery. IMPRESSION: Note that a small portion of the lateral left base not included. Visualized lungs clear. Stable cardiac prominence. Central catheter tip in right atrium without pneumothorax. Aortic Atherosclerosis (ICD10-I70.0). Focus of calcification noted in left carotid artery. Electronically Signed   By: Lowella Grip III M.D.   On: 10/09/2020 09:36   US LIVER DOPPLER  Result Date: 10/10/2020 CLINICAL  DATA:  Cirrhosis of liver with ascites EXAM: DUPLEX ULTRASOUND OF LIVER TECHNIQUE: Color and duplex Doppler ultrasound was performed to evaluate the hepatic in-flow and out-flow vessels. COMPARISON:  CT abdomen/pelvis yesterday FINDINGS: Liver: Diffusely nodular liver contour. The liver appears small in the parenchyma is diffusely heterogeneous and echogenic. Hypoechoic soft tissue mass in the left upper lobe measures 4.8 x 4.4 x 5.0 cm. Hypoechoic solid mass in the right hepatic lobe measures approximately 4.9 x 5.7 x 8.3 cm. Diffusely thickened gallbladder wall. Sludge is present within the gallbladder lumen. The common bile duct is normal at 3 mm. Main Portal Vein size: 1.2 cm Portal Vein Velocities Main Prox:  41 cm/sec with hepatopetal directional flow. Main Mid: 55 cm/sec with hepatopetal directional flow. Main Dist:  71 cm/sec with hepatopetal directional flow. Right: Unable to visualize Left: 28 cm/sec with hepatopetal directional flow. Hepatic Vein Velocities Right:  27 cm/sec Middle:  32 cm/sec Left:  68 cm/sec IVC: Present and patent with normal respiratory phasicity. Hepatic Artery Velocity:  109 cm/sec Splenic Vein Velocity:  34 cm/sec Spleen: 8.4 cm x 12.0 cm x 8.2 cm with a total volume of 436 cm^3 (411 cm^3 is upper limit normal) Portal Vein Occlusion/Thrombus: No Splenic Vein Occlusion/Thrombus: No Ascites: None Varices: Moderate ascites. IMPRESSION: 1. 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 2. Portal veins remain patent with hepatopetal directional flow. 3. Previously  treated left hepatic mass measures approximately 4.8 x 4.4 x 5.0 cm. Viability of the lesion cannot be assessed with ultrasound. 4. Right hepatic mass measuring approximately 4.9 x 5.7 x 8.3 cm not identified on prior imaging. Recommend further evaluation with gadolinium-enhanced MRI of the abdomen when the patient's clinical status is improved, preferably as an outpatient to facilitate  optimal imaging quality and diagnostic sensitivity. Electronically Signed   By: Jacqulynn Cadet M.D.   On: 10/10/2020 07:45   Medications: . sodium chloride    . sodium chloride    . albumin human    . albumin human 25 g (10/10/20 0828)  . albumin human    . ceFEPime (MAXIPIME) IV    . diltiazem (CARDIZEM) infusion 10 mg/hr (10/09/20 2320)   . Chlorhexidine Gluconate Cloth  6 each Topical Q0600  . lactulose  300 mL Rectal BID  . midodrine  10 mg Oral TID  . phytonadione  5 mg Oral Once  . rifaximin  550 mg Oral BID    Dialysis Orders: TTS at [facility] -- brand new, only had 1 treatment there on 4/5, establishing orders 2.5hr today -> 3hr -> 3.5hr. EDW ~87kg, 2K/2.5Ca, TDC, no heparin  Assessment/Plan: 1. Sepsis/Bacteremia: BCx 4/6 growing GPC in 1 of 2 bottles. On Vanc/Cefepime. ?Consider repeat paracentesis with Cx/gram stain. 2. Hepatic encephalopathy/decompensated liver failure: Hx cirrhosis/HCC s/p immunotherapy: Ammonia 190 on admit -> now 86 -> 79, follow. S/p Vit K. On lactulose and rifaximin, per primary. 3. A-Fib RVR: S/p diltiazem drip, improving. 4. ESRD: New start -> titrating up dialysis Rx -> next HD tomorrow for 3hr. 5. HTN/volume: BP low/stable, on midodrine 10mg  TID. UF with HD as tolerated. 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 7. Secondary hyperparathyroidism: CorrCa borderline high, Phos ok without binders. 8. Nutrition: Alb low, will add protein supplements once eating.  Veneta Penton, PA-C 10/10/2020, 11:26 AM  [employer]

## 2020-10-10 NOTE — Progress Notes (Signed)
Initial Nutrition Assessment  DOCUMENTATION CODES:  Non-severe (moderate) malnutrition in context of chronic illness  INTERVENTION:   Advance diet as medically able.  Once diet is advanced, add:  Boost Breeze po TID, each supplement provides 250 kcal and 9 grams of protein.  Ensure Enlive po TID, each supplement provides 350 kcal and 20 grams of protein.  30 ml ProSource po BID, each supplement provides 100 kcal and 15 grams of protein.  Magic cup TID with meals, each supplement provides 290 kcal and 9 grams of protein.  Add Rena-Vite daily.  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  GOAL:  Patient will meet greater than or equal to 90% of their needs  MONITOR:  Diet advancement,Labs,I & O's,Weight trends  REASON FOR ASSESSMENT:  Malnutrition Screening Tool    ASSESSMENT:  70 yo male with a PMH of hepatocellular carcinoma s/p radio embolization, immunotherapy with Nivolumab x10 cycles last cycle was September 03, 173, alcoholic cirrhosis with portal hypertension, PAF, GERD, HLD, HTN, and CKD stage 5 starting dialysis who presents with hepatic encephalopathy and sepsis. 4/4 - PTA paracentesis with outpt GI - removed 3 L of yellow fluid 4/5 - PTA first dialysis, tolerated well  Per MD note, pt's wife reports poor PO intake for a month now. He has ocasional nausea and dry heaves a few times a day the last couple of weeks, but no episodes of emesis.   Spoke with wife at bedside. Pt a little confused and agitated at time of visit. Wife reports pt has not eaten anything in 1.5 months. He will drink water and 2-3 Ensures per day, but will not eat anything.  She endorses a 45 lb weight loss since his cancer  diagnosis last June.Per Epic, pt has lost approximately 20 lbs (9.3% BW) since 07/11/20, which is significant and severe. On exam, pt has some moderate depletions and more severe in legs, as pt does not walk around very much at home. Pt at risk for severe malnutrition.  Wife asked for any nutritional help we can provide. Once pt's diet is advanced, recommend adding Boost Breeze TID (pt likes juices), Ensure TID, Magic Cup TID, and Prostat BID as well as Rena-Vite. If pt still does not continue to eat, attempt Hormel shakes TID.   Pt may need nutrition support if this continues. If Cortrak and nutrition support initiated, pt is at refeeding risk. Monitor K, Mag, and Phos for at least 3 days and replete as necessary.  Relevant Medications: lactulose enemas BID, midodrine TID, Vitamin K 5 mg, cefepime Labs: reviewed; CBG 103-120  NUTRITION - FOCUSED PHYSICAL EXAM: Flowsheet Row Most Recent Value  Orbital Region Moderate depletion  Upper Arm Region Moderate depletion  Thoracic and Lumbar Region No depletion  Buccal Region Moderate depletion  Temple Region Moderate depletion  Clavicle Bone Region Mild depletion  Clavicle and Acromion Bone Region Moderate depletion  Scapular Bone Region Unable to assess  [pt agitated and would not roll to side or sit up]  Dorsal Hand Mild depletion  Patellar Region Severe depletion  Anterior Thigh Region Severe depletion  Posterior Calf Region Severe depletion  Edema (RD Assessment) None  Hair Reviewed  Eyes Reviewed  Mouth Reviewed  Skin Reviewed  Nails Reviewed     Diet Order:   Diet  Order            Diet NPO time specified Except for: Sips with Meds  Diet effective now                EDUCATION NEEDS:  Education needs have been addressed  Skin:  Skin Assessment: Reviewed RN Assessment  Last BM:  10/10/20, Type 7, rectal pouch  Height:  Ht Readings from Last 1 Encounters:  10/09/20 5\' 8"  (1.727 m)   Weight:  Wt Readings from Last 1  Encounters:  10/10/20 86.8 kg   Ideal Body Weight:  70 kg  BMI:  Body mass index is 29.1 kg/m.  Estimated Nutritional Needs:  Kcal:  1950-2150 Protein:  95-110 grams Fluid:  >1.95 L  Derrel Nip, RD, LDN Registered Dietitian After Hours/Weekend Pager # in [phone]

## 2020-10-10 NOTE — Progress Notes (Addendum)
PROGRESS NOTE  Dillon Perkins UGQ:[id number] DOB: 03-12-1951 DOA: 10/09/2020 PCP: Antony Contras, MD  HPI/Recap of past 24 hours: Dillon Perkins is a 70 y.o. male with medical history significant of hepatocellular carcinoma status post radio embolization, immunotherapy with Nivolumab x10 cycles last cycle was September 04, 8826, alcoholic cirrhosis with portal hypertension, PAF noncompliant with Cardizem, CKD stage V starting dialysis, presented with altered mentations.  Wife reported that patient has become lethargic, poor oral appetite for 1+ month.  Since last week, patient " has been sleepy all day", no fever, no cough no diarrhea.  This morning, patient woke up confused, moaning and disoriented.  Patient also received first hemo-dialysis yesterday.  Patient was diagnosed with alcoholic cirrhosis and has been following with gastroenterologist and undergoing periodic paracentesis and the last paracentesis was 2 days ago of 3 L removed.  It does not appear the patient been taking any cirrhosis medications.  ED Course: And was found to be in rapid A. fib, blood pressure on the lower side responded to LR bolus 500.  Ammonia 160.  Lactic acid 4.0, bilirubin 4.9, ALT AST elevated.  WBC 7.2.  CT head negative for acute findings.  Chest x-ray negative for acute infiltration.  VBG showed respiratory alkalosis.  10/10/20: Patient was seen and examined at his bedside.  He is drowsy however he is arousable to voices.  His abdomen is mildly distended, his abdomen does not appear tender on palpation, bowel sounds present.  Per bedside RN patient became hypotensive during hemodialysis today.  It was stopped due to BP of 77/63.  Patient has not taken his midodrine 10 mg 3 times daily.  He has received IV Albumin.   Assessment/Plan: Active Problems:   Atrial fibrillation with RVR (HCC)   Sepsis (HCC)   Delirium  Acute hepatic encephalopathy Presented with elevated ammonia level, 190 He has received  lactulose rectally with improvement of ammonia level Still Drowsy and confused Continue to treat underlying condition Added IV vancomycin on 10/10/2020 Continue cefepime started on admission. Rule out SBP We will need diagnostic paracentesis Continue IV Albumin Continue to reorient as needed Delirium precautions.  Decompensated alcoholic cirrhosis with HCC Meld score of 31 Continue lactulose rectally and rifaximin Rule out SBP with diagnostic paracentesis along with IV albumin Start sodium restricted diet. Goal 2-3 soft stools per day while on lactulose, titrate to achieve that goal. GI following, appreciate assistance  Worsening coagulopathy INR 1.9 IV vitamin K 5 mg daily x3 doses. Repeat INR in the morning.  Positive blood culture 1 out of 2 Blood culture drawn on 10/09/2020 + for gram positive cocci 1 out of 2 bottle, aerobic bottle only.  Possibly contaminant. Continue to closely monitor IV vancomycin was restarted on 10/10/2020.  History of staph hominis bacterial peritonitis on 08/02/20 With resistance to oxacillin IV vancomycin restarted on 10/10/2020. Appreciate pharmacy's assistance with dosing  Resolved paroxysmal A. fib with RVR Initially on Cardizem drip, stopped on 10/10/2020 We will start p.o. Lopressor 12.5 mg twice daily once his BP has improved.  Hyperbilirubinemia with jaundice T bili down trending 6.7 from 7.2. AST to ALT 2:1 ratio  Hyperammonemia in the setting of decompensated alcoholic cirrhosis Presented with ammonia level 190 Goal 2-3 soft stools per day while on lactulose, titrate to achieve that goal.  New ESRD on hemodialysis Tuesday Thursday Saturday Unable to complete hemodialysis on 10/10/2020 due to hypotension.  Mild non-anion gap metabolic acidosis Serum bicarb 21 Electrolytes addressed with hemodialysis Appreciate nephrology's assistance.  Chronic hypotension on midodrine Continue midodrine Maintain MAP greater than 65.  Physical  debility PT OT to assess once the patient is hemodynamically stable Fall precautions  Goals of care Palliative care team consult to assist with establishing goals of care. Currently the patient is full code.  Code Status: Full code.  Family Communication: Updated his wife via phone on 10/10/2020.  Disposition Plan: Likely will discharge to home with home health services once hemodynamically stable and GI has signed off.   Consultants:  GI.  Nephrology.  IR for diagnostic paracentesis  Procedures:  HD  Antimicrobials:  IV vancomycin  IV cefepime.  DVT prophylaxis: SCDs.  Status is: Inpatient   Dispo:  Patient From: Home  Planned Disposition: Home with Health Care [facility] when hemodynamically stable and GI has signed off.  Medically stable for discharge: No, ongoing management of acute hepatic encephalopathy.          Objective: Vitals:   10/10/20 1208 10/10/20 1212 10/10/20 1220 10/10/20 1247  BP: 91/64 100/72 (!) 101/59 98/60  Pulse: 94 85 83 95  Resp: 17 18 17  (!) 22  Temp:   97.8 F (36.6 C)   TempSrc:   Oral   SpO2: 99% 100% 99% 98%  Weight:      Height:        Intake/Output Summary (Last 24 hours) at 10/10/2020 1303 Last data filed at 10/10/2020 1216 Gross per 24 hour  Intake 1837.5 ml  Output 702 ml  Net 1135.5 ml   Filed Weights   10/09/20 0857 10/10/20 0910  Weight: 88.3 kg 86.8 kg    Exam:  . General: 70 y.o. year-old male well developed well nourished in no acute distress.  Drowsy but arousable to voices.  Icteric sclerae. . Cardiovascular: Regular rate and rhythm with no rubs or gallops.  No thyromegaly or JVD noted.   Marland Kitchen Respiratory: Clear to auscultation with no wheezes or rales. Good inspiratory effort. . Abdomen: Soft moderately distended.  Bowel sounds present.  Nontender with palpation.   . Musculoskeletal: No lower extremity edema. . Skin: No ulcerative lesions noted or rashes.  Jaundice. Marland Kitchen Psychiatry: Mood is appropriate for  condition and setting   Data Reviewed: CBC: Recent Labs  Lab 10/09/20 1055 10/09/20 1412 10/09/20 2023 10/10/20 0547  WBC 7.2  --  7.1 6.9  NEUTROABS 6.2  --   --  5.5  HGB 10.1* 9.2* 9.9* 9.1*  HCT 30.1* 27.0* 28.7* 26.6*  MCV 106.4*  --  104.7* 106.0*  PLT 106*  --  99* 91*   Basic Metabolic Panel: Recent Labs  Lab 10/09/20 1055 10/09/20 1412 10/09/20 2023 10/10/20 0547  NA 133* 136 133* 136  K 4.9 5.0 4.8 4.6  CL 102  --  100 104  CO2 21*  --  21* 21*  GLUCOSE 120*  --  113* 107*  BUN 76*  --  74* 78*  CREATININE 3.35*  --  3.21* 3.41*  CALCIUM 8.7*  --  8.7* 9.2  PHOS  --   --  4.9*  --    GFR: Estimated Creatinine Clearance: 21.3 mL/min (A) (by C-G formula based on SCr of 3.41 mg/dL (H)). Liver Function Tests: Recent Labs  Lab 10/09/20 1055 10/09/20 2023 10/10/20 0547  AST 61*  --  85*  ALT 46*  --  44  ALKPHOS 106  --  85  BILITOT 4.9*  --  6.4*  PROT 7.2  --  6.7  ALBUMIN 2.2* 2.1* 2.8*  Recent Labs  Lab 10/09/20 1055  LIPASE 68*   Recent Labs  Lab 10/09/20 1055 10/09/20 2023 10/10/20 0547  AMMONIA 190* 86* 79*   Coagulation Profile: Recent Labs  Lab 10/09/20 1042 10/10/20 0547  INR 1.7* 1.9*   Cardiac Enzymes: No results for input(s): CKTOTAL, CKMB, CKMBINDEX, TROPONINI in the last 168 hours. BNP (last 3 results) No results for input(s): PROBNP in the last 8760 hours. HbA1C: No results for input(s): HGBA1C in the last 72 hours. CBG: Recent Labs  Lab 10/09/20 0851  GLUCAP 110*   Lipid Profile: No results for input(s): CHOL, HDL, LDLCALC, TRIG, CHOLHDL, LDLDIRECT in the last 72 hours. Thyroid Function Tests: No results for input(s): TSH, T4TOTAL, FREET4, T3FREE, THYROIDAB in the last 72 hours. Anemia Panel: No results for input(s): VITAMINB12, FOLATE, FERRITIN, TIBC, IRON, RETICCTPCT in the last 72 hours. Urine analysis:    Component Value Date/Time   COLORURINE AMBER (A) 10/09/2020 1153   APPEARANCEUR CLEAR 10/09/2020  1153   LABSPEC 1.016 10/09/2020 1153   PHURINE 5.0 10/09/2020 1153   GLUCOSEU NEGATIVE 10/09/2020 1153   HGBUR NEGATIVE 10/09/2020 1153   BILIRUBINUR NEGATIVE 10/09/2020 1153   KETONESUR NEGATIVE 10/09/2020 1153   PROTEINUR NEGATIVE 10/09/2020 1153   UROBILINOGEN 1.0 07/23/2014 0402   NITRITE NEGATIVE 10/09/2020 1153   LEUKOCYTESUR SMALL (A) 10/09/2020 1153   Sepsis Labs: @LABRCNTIP (procalcitonin:4,lacticidven:4)  ) Recent Results (from the past 240 hour(s))  Blood Culture (routine x 2)     Status: None (Preliminary result)   Collection Time: 10/09/20  9:44 AM   Specimen: BLOOD  Result Value Ref Range Status   Specimen Description BLOOD RIGHT ANTECUBITAL  Final   Special Requests   Final    BOTTLES DRAWN AEROBIC AND ANAEROBIC Blood Culture results may not be optimal due to an inadequate volume of blood received in culture bottles   Culture  Setup Time   Final    GRAM POSITIVE COCCI IN CLUSTERS AEROBIC BOTTLE ONLY Organism ID to follow CRITICAL RESULT CALLED TO, READ BACK BY AND VERIFIED WITHMarisue Ivan PHARMD 1301 10/10/20 A BROWNING Performed at [facility], [place] [address] [place], [place] [address]    Culture GRAM POSITIVE COCCI  Final   Report Status PENDING  Incomplete  Blood Culture (routine x 2)     Status: None (Preliminary result)   Collection Time: 10/09/20  9:51 AM   Specimen: BLOOD RIGHT HAND  Result Value Ref Range Status   Specimen Description BLOOD RIGHT HAND  Final   Special Requests   Final    BOTTLES DRAWN AEROBIC AND ANAEROBIC Blood Culture adequate volume   Culture   Final    NO GROWTH < 12 HOURS Performed at [facility], [place] [address] [place], [place] [address]    Report Status PENDING  Incomplete  Resp Panel by RT-PCR (Flu A&B, Covid) Nasopharyngeal Swab     Status: None   Collection Time: 10/09/20  9:51 AM   Specimen: Nasopharyngeal Swab; Nasopharyngeal(NP) swabs in vial transport medium  Result Value Ref Range Status   SARS  Coronavirus 2 by RT PCR NEGATIVE NEGATIVE Final    Comment: (NOTE) SARS-CoV-2 target nucleic acids are NOT DETECTED.  The SARS-CoV-2 RNA is generally detectable in upper respiratory specimens during the acute phase of infection. The lowest concentration of SARS-CoV-2 viral copies this assay can detect is 138 copies/mL. A negative result does not preclude SARS-Cov-2 infection and should not be used as the sole basis for treatment  or other patient management decisions. A negative result may occur with  improper specimen collection/handling, submission of specimen other than nasopharyngeal swab, presence of viral mutation(s) within the areas targeted by this assay, and inadequate number of viral copies(<138 copies/mL). A negative result must be combined with clinical observations, patient history, and epidemiological information. The expected result is Negative.  Fact Sheet for Patients:  EntrepreneurPulse.com.au  Fact Sheet for Healthcare Providers:  IncredibleEmployment.be  This test is no t yet approved or cleared by the [place] FDA and  has been authorized for detection and/or diagnosis of SARS-CoV-2 by FDA under an Emergency Use Authorization (EUA). This EUA will remain  in effect (meaning this test can be used) for the duration of the COVID-19 declaration under Section 564(b)(1) of the Act, 21 U.S.C.section 360bbb-3(b)(1), unless the authorization is terminated  or revoked sooner.       Influenza A by PCR NEGATIVE NEGATIVE Final   Influenza B by PCR NEGATIVE NEGATIVE Final    Comment: (NOTE) The Xpert Xpress SARS-CoV-2/FLU/RSV plus assay is intended as an aid in the diagnosis of influenza from Nasopharyngeal swab specimens and should not be used as a sole basis for treatment. Nasal washings and aspirates are unacceptable for Xpert Xpress SARS-CoV-2/FLU/RSV testing.  Fact Sheet for  Patients: EntrepreneurPulse.com.au  Fact Sheet for Healthcare Providers: IncredibleEmployment.be  This test is not yet approved or cleared by the [place] FDA and has been authorized for detection and/or diagnosis of SARS-CoV-2 by FDA under an Emergency Use Authorization (EUA). This EUA will remain in effect (meaning this test can be used) for the duration of the COVID-19 declaration under Section 564(b)(1) of the Act, 21 U.S.C. section 360bbb-3(b)(1), unless the authorization is terminated or revoked.  Performed at [facility], [place] [address], [place], [place] [address]   Urine culture     Status: None   Collection Time: 10/09/20 11:53 AM   Specimen: In/Out Cath Urine  Result Value Ref Range Status   Specimen Description IN/OUT CATH URINE  Final   Special Requests NONE  Final   Culture   Final    NO GROWTH Performed at [facility], [place] [address], [place], [place] [address]    Report Status 10/10/2020 FINAL  Final      Studies: CT Abdomen Pelvis Wo Contrast  Result Date: 10/09/2020 CLINICAL DATA:  Abdominal distension. 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 COMPARISON:  MRI from 07/04/2020 FINDINGS: Lower chest: Trace bilateral pleural effusions identified. Hepatobiliary: The liver appears cirrhotic. Treated tumor within segment 2 is measures 5.3 x 4.3 cm on today's exam, image 17/3. This is compared with 5.0 x 4.6 cm previously. The other known liver lesions are difficult to assess reflecting lack of IV contrast material. Sludge and tiny stones identified within the dependent portion of the gallbladder. Pancreas: Unremarkable. No pancreatic ductal dilatation or surrounding inflammatory changes. Spleen: Normal in size  without focal abnormality. Adrenals/Urinary Tract: Normal appearance of the adrenal glands. Small bilateral renal calculi identified. No hydronephrosis. Urinary bladder is unremarkable. Stomach/Bowel: The stomach appears nondistended. Small bowel loops are unremarkable. Mild diffuse colonic wall thickening is nonspecific in the setting of ascites and portal venous hypertension. Vascular/Lymphatic: Aortic atherosclerosis. No aneurysm. Previous GDA embolization. Esophageal and upper abdominal varices. No abdominopelvic adenopathy. Reproductive: Prostate is unremarkable. Other: Large volume of  ascites within the abdomen and pelvis. A left inguinal hernia is identified which is filled with ascites as well as a nonobstructed loop of sigmoid colon. No discrete fluid collections identified. Musculoskeletal: Bilateral L5 pars defects identified. Mild anterolisthesis of L5 on S1 noted. IMPRESSION: 1. Large volume of ascites identified within the abdomen and pelvis. 2. Cirrhosis with known multifocal hepatocellular carcinoma. Status post Y 90 microsphere ablation. Dominant lesion in segment 2 appears similar to 07/04/2020 3.  Aortic Atherosclerosis (ICD10-I70.0). 4. Gallstones. 5. Trace pleural effusions. Electronically Signed   By: Kerby Moors M.D.   On: 10/09/2020 13:49   US LIVER DOPPLER  Result Date: 10/10/2020 CLINICAL DATA:  Cirrhosis of liver with ascites EXAM: DUPLEX ULTRASOUND OF LIVER TECHNIQUE: Color and duplex Doppler ultrasound was performed to evaluate the hepatic in-flow and out-flow vessels. COMPARISON:  CT abdomen/pelvis yesterday FINDINGS: Liver: Diffusely nodular liver contour. The liver appears small in the parenchyma is diffusely heterogeneous and echogenic. Hypoechoic soft tissue mass in the left upper lobe measures 4.8 x 4.4 x 5.0 cm. Hypoechoic solid mass in the right hepatic lobe measures approximately 4.9 x 5.7 x 8.3 cm. Diffusely thickened gallbladder wall. Sludge is present within the  gallbladder lumen. The common bile duct is normal at 3 mm. Main Portal Vein size: 1.2 cm Portal Vein Velocities Main Prox:  41 cm/sec with hepatopetal directional flow. Main Mid: 55 cm/sec with hepatopetal directional flow. Main Dist:  71 cm/sec with hepatopetal directional flow. Right: Unable to visualize Left: 28 cm/sec with hepatopetal directional flow. Hepatic Vein Velocities Right:  27 cm/sec Middle:  32 cm/sec Left:  68 cm/sec IVC: Present and patent with normal respiratory phasicity. Hepatic Artery Velocity:  109 cm/sec Splenic Vein Velocity:  34 cm/sec Spleen: 8.4 cm x 12.0 cm x 8.2 cm with a total volume of 436 cm^3 (411 cm^3 is upper limit normal) Portal Vein Occlusion/Thrombus: No Splenic Vein Occlusion/Thrombus: No Ascites: None Varices: Moderate ascites. IMPRESSION: 1. 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 2. Portal veins remain patent with hepatopetal directional flow. 3. Previously treated left hepatic mass measures approximately 4.8 x 4.4 x 5.0 cm. Viability of the lesion cannot be assessed with ultrasound. 4. Right hepatic mass measuring approximately 4.9 x 5.7 x 8.3 cm not identified on prior imaging. Recommend further evaluation with gadolinium-enhanced MRI of the abdomen when the patient's clinical status is improved, preferably as an outpatient to facilitate optimal imaging quality and diagnostic sensitivity. Electronically Signed   By: Jacqulynn Cadet M.D.   On: 10/10/2020 07:45    Scheduled Meds: . Chlorhexidine Gluconate Cloth  6 each Topical Q0600  . [START ON 10/11/2020] darbepoetin (ARANESP) injection - DIALYSIS  60 mcg Intravenous Q Fri-HD  . lactulose  20 g Oral TID  . lactulose  300 mL Rectal BID  . midodrine  10 mg Oral TID  . phytonadione  5 mg Oral Once  . rifaximin  550 mg Oral BID    Continuous Infusions: . sodium chloride    . sodium chloride    . albumin human    . albumin human 25 g (10/10/20 0828)  . albumin human     . ceFEPime (MAXIPIME) IV 2 g (10/10/20 1245)  . phytonadione (VITAMIN K) IV       LOS: 1 day     Kayleen Memos, MD [facility] Pager [phone]  If 7PM-7AM, please contact night-coverage [email or web address] Password [email or web address] 10/10/2020, 1:03 PM

## 2020-10-10 NOTE — Progress Notes (Signed)
Report received  and assumed care of patient at this time .

## 2020-10-10 NOTE — Progress Notes (Signed)
Patient unable to answer admission questions

## 2020-10-10 NOTE — Procedures (Signed)
PROCEDURE SUMMARY:  Successful US guided paracentesis from LLQ.  Yielded 5.8L of clear yellow fluid.  No immediate complications.  Pt tolerated well.   Specimen was sent for labs.  EBL < 85mL  Ascencion Dike PA-C 10/10/2020 3:34 PM

## 2020-10-10 NOTE — Progress Notes (Addendum)
HEMATOLOGY-ONCOLOGY PROGRESS NOTE  SUBJECTIVE: Mr. Hjort is followed by our office for multifocal hepatocellular carcinoma.  He has been receiving treatment with nivolumab.  Last dose 09/03/2020.  Most recent treatment has been held due to rash, diarrhea, and progressive renal failure.  There was concern that his symptoms were related to hepatorenal syndrome and progression of his HCC.  MRI of the liver has been ordered, but not yet performed.  Now admitted altered mental status.  Patient slept majority of the time I was in the room.  Family were at the bedside.  She has not noticed any specific complaints.  States that he is quite "fidgety."  Oncology History  Hepatocellular carcinoma ([employer])  03/26/2020 Initial Diagnosis   Hepatocellular carcinoma ([place])   04/08/2020 -  Chemotherapy      Patient is on Antibody Plan: COLORECTAL NIVOLUMAB Q14D     PHYSICAL EXAMINATION:  Vitals:   10/10/20 1220 10/10/20 1247  BP: (!) 101/59 98/60  Pulse: 83 95  Resp: 17 18  Temp: 97.8 F (36.6 C)   SpO2: 99% 98%   Filed Weights   10/09/20 0857 10/10/20 0910  Weight: 88.3 kg 86.8 kg    Intake/Output from previous day: 04/06 0701 - 04/07 0700 In: 4187.5 [I.V.:587.5; IV Piggyback:3600] Out: 680 [Urine:200; Stool:480]  GENERAL: Chronically ill-appearing male, no distress EYES: Jaundice present LUNGS: clear to auscultation and percussion with normal breathing effort HEART: regular rate & rhythm and no murmurs and no lower extremity edema ABDOMEN: Positive bowel sounds, distended NEURO: Opens eyes, does not answer questions  LABORATORY DATA:  I have reviewed the data as listed CMP Latest Ref Rng & Units 10/10/2020 10/09/2020 10/09/2020  Glucose 70 - 99 mg/dL 107(H) 113(H) -  BUN 8 - 23 mg/dL 78(H) 74(H) -  Creatinine 0.61 - 1.24 mg/dL 3.41(H) 3.21(H) -  Sodium 135 - 145 mmol/L 136 133(L) 136  Potassium 3.5 - 5.1 mmol/L 4.6 4.8 5.0  Chloride 98 - 111 mmol/L 104 100 -  CO2 22 - 32 mmol/L 21(L) 21(L) -   Calcium 8.9 - 10.3 mg/dL 9.2 8.7(L) -  Total Protein 6.5 - 8.1 g/dL 6.7 - -  Total Bilirubin 0.3 - 1.2 mg/dL 6.4(H) - -  Alkaline Phos 38 - 126 U/L 85 - -  AST 15 - 41 U/L 85(H) - -  ALT 0 - 44 U/L 44 - -    Lab Results  Component Value Date   WBC 6.9 10/10/2020   HGB 9.1 (L) 10/10/2020   HCT 26.6 (L) 10/10/2020   MCV 106.0 (H) 10/10/2020   PLT 91 (L) 10/10/2020   NEUTROABS 5.5 10/10/2020    CT Abdomen Pelvis Wo Contrast  Result Date: 10/09/2020 CLINICAL DATA:  Abdominal distension. 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 COMPARISON:  MRI from 07/04/2020 FINDINGS: Lower chest: Trace bilateral pleural effusions identified. Hepatobiliary: The liver appears cirrhotic. Treated tumor within segment 2 is measures 5.3 x 4.3 cm on today's exam, image 17/3. This is compared with 5.0 x 4.6 cm previously. The other known liver lesions are difficult to assess reflecting lack of IV contrast material. Sludge and tiny stones identified within the dependent portion of the gallbladder. Pancreas: Unremarkable. No pancreatic ductal dilatation or surrounding inflammatory changes. Spleen: Normal in size without focal abnormality. Adrenals/Urinary Tract: Normal appearance of the adrenal glands. Small bilateral renal calculi  identified. No hydronephrosis. Urinary bladder is unremarkable. Stomach/Bowel: The stomach appears nondistended. Small bowel loops are unremarkable. Mild diffuse colonic wall thickening is nonspecific in the setting of ascites and portal venous hypertension. Vascular/Lymphatic: Aortic atherosclerosis. No aneurysm. Previous GDA embolization. Esophageal and upper abdominal varices. No abdominopelvic adenopathy. Reproductive: Prostate is unremarkable. Other: Large volume of ascites within the  abdomen and pelvis. A left inguinal hernia is identified which is filled with ascites as well as a nonobstructed loop of sigmoid colon. No discrete fluid collections identified. Musculoskeletal: Bilateral L5 pars defects identified. Mild anterolisthesis of L5 on S1 noted. IMPRESSION: 1. Large volume of ascites identified within the abdomen and pelvis. 2. Cirrhosis with known multifocal hepatocellular carcinoma. Status post Y 90 microsphere ablation. Dominant lesion in segment 2 appears similar to 07/04/2020 3.  Aortic Atherosclerosis (ICD10-I70.0). 4. Gallstones. 5. Trace pleural effusions. Electronically Signed   By: Kerby Moors M.D.   On: 10/09/2020 13:49   CT Head Wo Contrast  Result Date: 10/09/2020 CLINICAL DATA:  Neurological deficit EXAM: CT HEAD WITHOUT CONTRAST TECHNIQUE: Contiguous axial images were obtained from the base of the skull through the vertex without intravenous contrast. COMPARISON:  Prior head CT 05/29/2020 FINDINGS: Brain: No evidence of acute infarction, hemorrhage, hydrocephalus, extra-axial collection or mass lesion/mass effect. Stable scattered foci of white matter hypoattenuation consistent with chronic microvascular ischemic white matter disease. Tiny bilateral cerebellar hemisphere lacunar infarcts are also unchanged. Mild cortical atrophy without interval progression. Vascular: No hyperdense vessel or unexpected calcification. Atherosclerotic vascular calcifications in the bilateral cavernous and supraclinoid carotid arteries. Skull: Normal. Negative for fracture or focal lesion. Sinuses/Orbits: No acute finding. Other: None. IMPRESSION: 1. No acute intracranial abnormality. 2. Stable cortical atrophy, chronic microvascular ischemic white matter disease and bilateral cerebellar hemisphere lacunar infarcts. Electronically Signed   By: Jacqulynn Cadet M.D.   On: 10/09/2020 11:00   DG Chest Port 1 View  Result Date: 10/09/2020 CLINICAL DATA:  Altered mental status. History of  hepatocellular carcinoma EXAM: PORTABLE CHEST 1 VIEW COMPARISON:  May 22, 2020. FINDINGS: Note that a small amount of the lateral left base not included. Central catheter tip is in the right atrium. No pneumothorax. There is no edema or airspace opacity. Heart is mildly enlarged with pulmonary vascularity normal. No adenopathy. There is aortic atherosclerosis. No bone lesions. A focus of calcification is noted in the left carotid artery. IMPRESSION: Note that a small portion of the lateral left base not included. Visualized lungs clear. Stable cardiac prominence. Central catheter tip in right atrium without pneumothorax. Aortic Atherosclerosis (ICD10-I70.0). Focus of calcification noted in left carotid artery. Electronically Signed   By: Lowella Grip III M.D.   On: 10/09/2020 09:36   US LIVER DOPPLER  Result Date: 10/10/2020 CLINICAL DATA:  Cirrhosis of liver with ascites EXAM: DUPLEX ULTRASOUND OF LIVER TECHNIQUE: Color and duplex Doppler ultrasound was performed to evaluate the hepatic in-flow and out-flow vessels. COMPARISON:  CT abdomen/pelvis yesterday FINDINGS: Liver: Diffusely nodular liver contour. The liver appears small in the parenchyma is diffusely heterogeneous and echogenic. Hypoechoic soft tissue mass in the left upper lobe measures 4.8 x 4.4 x 5.0 cm. Hypoechoic solid mass in the right hepatic lobe measures approximately 4.9 x 5.7 x 8.3 cm. Diffusely thickened gallbladder wall. Sludge is present within the gallbladder lumen. The common bile duct is normal at 3 mm. Main Portal Vein size: 1.2 cm Portal Vein Velocities Main Prox:  41 cm/sec with hepatopetal directional flow. Main Mid: 55 cm/sec with hepatopetal directional flow.  Main Dist:  71 cm/sec with hepatopetal directional flow. Right: Unable to visualize Left: 28 cm/sec with hepatopetal directional flow. Hepatic Vein Velocities Right:  27 cm/sec Middle:  32 cm/sec Left:  68 cm/sec IVC: Present and patent with normal respiratory  phasicity. Hepatic Artery Velocity:  109 cm/sec Splenic Vein Velocity:  34 cm/sec Spleen: 8.4 cm x 12.0 cm x 8.2 cm with a total volume of 436 cm^3 (411 cm^3 is upper limit normal) Portal Vein Occlusion/Thrombus: No Splenic Vein Occlusion/Thrombus: No Ascites: None Varices: Moderate ascites. IMPRESSION: 1. 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 2. Portal veins remain patent with hepatopetal directional flow. 3. Previously treated left hepatic mass measures approximately 4.8 x 4.4 x 5.0 cm. Viability of the lesion cannot be assessed with ultrasound. 4. Right hepatic mass measuring approximately 4.9 x 5.7 x 8.3 cm not identified on prior imaging. Recommend further evaluation with gadolinium-enhanced MRI of the abdomen when the patient's clinical status is improved, preferably as an outpatient to facilitate optimal imaging quality and diagnostic sensitivity. Electronically Signed   By: Jacqulynn Cadet M.D.   On: 10/10/2020 07:45   IR Paracentesis  Result Date: 10/07/2020 INDICATION: Patient with history of cirrhosis, recurrent ascites. Request is made for therapeutic paracentesis of up to 3 L maximum. EXAM: ULTRASOUND GUIDED THERAPEUTIC PARACENTESIS MEDICATIONS: 10 mL 1% lidocaine COMPLICATIONS: None immediate. PROCEDURE: Informed written consent was obtained from the patient after a discussion of the risks, benefits and alternatives to treatment. A timeout was performed prior to the initiation of the procedure. Initial ultrasound scanning demonstrates a large amount of ascites within the right lower abdominal quadrant. The right lower abdomen was prepped and draped in the usual sterile fashion. 1% lidocaine was used for local anesthesia. Following this, a 19 gauge, 7-cm, Yueh catheter was introduced. An ultrasound image was saved for documentation purposes. The paracentesis was performed. The catheter was removed and a dressing was applied. The patient tolerated the  procedure well without immediate post procedural complication. FINDINGS: A total of approximately 3.0 liters of yellow fluid was removed. IMPRESSION: Successful ultrasound-guided therapeutic paracentesis yielding 3.0 liters of peritoneal fluid. Read by: Brynda Greathouse PA-C Electronically Signed   By: Aletta Edouard M.D.   On: 10/07/2020 13:19    ASSESSMENT AND PLAN: 1. Hepatocellular carcinoma ? Multiple Li-Rad 5 liver lesions noted on a CT abdomen/pelvis 01/02/2020 including a 7.1 x 5.7 cm segment 2 lesion, there are right and left hepatic lesions ? CT abdomen/pelvis 01/02/2020-cirrhosis with varices, multiple LR 5 liver lesions, 1.1 cm fluid density lesion in the pancreas head, splenomegaly, bilateral kidney stones, small amount of right perihepatic ascites ? MRI liver 02/08/2020-cirrhosis, numerous hyperenhancing liver lesions, largest lesion in segment two measuring 7.9 x 6.1 cm, lesions are consistent with multifocal hepatocellular carcinoma, the three largest lesions are categorized as LI-RADS5. 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 Y90 left hepatic lobe 03/01/2020 ? Cycle 1 nivolumab 04/08/2020 ? Cycle 2 nivolumab 04/22/2020 ? Cycle 3 nivolumab 05/13/2020 ? Cycle 4 nivolumab 05/27/2020 ? Cycle 5 nivolumab 06/11/2020 ? MRI liver 07/04/2020-cirrhosis with portal venous hypertension and ascites. Decrease in size and enhancement associated with treated left liver lesions, segment 6 lesion mildly increased in size, decreased size of segment 5 lesion ? Cycle 6 nivolumab 07/09/2020 ? Tumor board 07/17/2020-not a candidate for further Y90, continue nivolumab ? Cycle 7 nivolumab 07/23/2020 ? Cycle 8 nivolumab 08/06/2020 ? Cycle 9 nivolumab 08/20/2020 ?  Cycle 10 nivolumab 09/03/2020 2. 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 Hypertension 4. BPH 5. Hyperlipidemia 6. History of heavy alcohol use 7. Remote history of tobacco use 8. Renal insufficiency 9. Microscopic hematuria 10. 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 Esophageal mucosal changes consistent with long segment Barrett's esophagus present in the distal esophagus. Severe portal hypertensive gastropathy found in the entire examined stomach. Small hiatal hernia. 11. Mild right hydronephrosis with a probable calculus in the proximal third of the right ureter on the MRI 03/06/2020, renal ultrasound 04/01/2020-multiple nonobstructing bilateral renal calculi  05/22/2020-laser lithotripsy, bilateral ureter stent placement 12. Atrial fibrillation-not on anticoagulation therapy 13. Dizziness-improved with discontinuation of Flomax. Not orthostatic 06/11/2020 14. Skin rash with pruritus 08/06/2020-likely a nivolumab rash, trial of hydrocortisone cream  Mr. Aday is now admitted due to altered mental status.  Symptoms are most likely due to hepatic encephalopathy.  GI following and is administering vitamin K and has had lactulose.  He received dialysis earlier today.  Hospitalist has ordered ultrasound-guided paracentesis.  The patient has likely developed hepatorenal syndrome due to worsening HCC.  However, difficult to compare CT and ultrasound to MRI of the liver.  Would like to get MRI of the liver when possible.  Discussed with MRI who indicate that with a GFR less than 30, they can administer contrast immediately before dialysis.  Therefore, we would need to perform the MRI just prior to a planned dialysis session.  Next dialysis is anticipated on Saturday.  Recommendations: 1.  We will consider MRI of the liver with and without contrast just prior to next planned dialysis.  Unsure if we can perform an MRI on the weekend. 2.  Ultrasound-guided paracentesis per  hospitalist. 3.  Continue lactulose per GI.   LOS: 1 day   Mikey Bussing, DNP, AGPCNP-BC, AOCNP 10/10/20   [name] was interviewed and examined.  He was confused when I saw him this morning.  He has multifocal HCC and multiple co-morbid conditions.  He is admitted with hepatic encephalopathy.  [name] has developed anorexia and malaise over the past month.  His symptoms are likely due to progressive renal failure and cirrhosis, but the [facility] may be progressing.  He began a trial of hemodialysis to see if this will help his symptoms.  We will obtain a restaging MRI liver when his mental status improves.  I will recommend hospice care if there is progression on the [facility]  I was present for >50% of today's visit and I performed medical decision making.

## 2020-10-10 NOTE — Plan of Care (Signed)

## 2020-10-10 NOTE — Progress Notes (Signed)
PHARMACY - PHYSICIAN COMMUNICATION CRITICAL VALUE ALERT - BLOOD CULTURE IDENTIFICATION (BCID)  Dillon Perkins is an 70 y.o. male who presented to [facility] on 10/09/2020 with a chief complaint of mental confusion and poor appetite.   Assessment:  70 yo male presents with confusion. The pt is on hemodialysis and has a history of alcoholic cirrhosis with portal hypertension, hepatocellular carcinoma s/p radio embolization/immunotherapy, and atrial fibrillation.   The pt is with acute hepatic encephalopathy, there is concern for SBP that is already being covered by the cephalosporin. BCID with gram positive cocci in clusters, showing staphylococcus species in 1/4 bottles which is likely a contaminant. The patient is on HD, therefore, vancomycin from 4/6 is likely still in his system.  Name of physician (or Provider) Contacted: Dr. Irene Pap  Current antibiotics: Cefepime IV 1g 24h  Changes to prescribed antibiotics recommended: Continue cefepime   Results for orders placed or performed during the hospital encounter of 10/09/20  Blood Culture ID Panel (Reflexed) (Collected: 10/09/2020  9:44 AM)  Result Value Ref Range   Enterococcus faecalis NOT DETECTED NOT DETECTED   Enterococcus Faecium NOT DETECTED NOT DETECTED   Listeria monocytogenes NOT DETECTED NOT DETECTED   Staphylococcus species DETECTED (A) NOT DETECTED   Staphylococcus aureus (BCID) NOT DETECTED NOT DETECTED   Staphylococcus epidermidis NOT DETECTED NOT DETECTED   Staphylococcus lugdunensis NOT DETECTED NOT DETECTED   Streptococcus species NOT DETECTED NOT DETECTED   Streptococcus agalactiae NOT DETECTED NOT DETECTED   Streptococcus pneumoniae NOT DETECTED NOT DETECTED   Streptococcus pyogenes NOT DETECTED NOT DETECTED   A.calcoaceticus-baumannii NOT DETECTED NOT DETECTED   Bacteroides fragilis NOT DETECTED NOT DETECTED   Enterobacterales NOT DETECTED NOT DETECTED   Enterobacter cloacae complex NOT DETECTED NOT DETECTED    Escherichia coli NOT DETECTED NOT DETECTED   Klebsiella aerogenes NOT DETECTED NOT DETECTED   Klebsiella oxytoca NOT DETECTED NOT DETECTED   Klebsiella pneumoniae NOT DETECTED NOT DETECTED   Proteus species NOT DETECTED NOT DETECTED   Salmonella species NOT DETECTED NOT DETECTED   Serratia marcescens NOT DETECTED NOT DETECTED   Haemophilus influenzae NOT DETECTED NOT DETECTED   Neisseria meningitidis NOT DETECTED NOT DETECTED   Pseudomonas aeruginosa NOT DETECTED NOT DETECTED   Stenotrophomonas maltophilia NOT DETECTED NOT DETECTED   Candida albicans NOT DETECTED NOT DETECTED   Candida auris NOT DETECTED NOT DETECTED   Candida glabrata NOT DETECTED NOT DETECTED   Candida krusei NOT DETECTED NOT DETECTED   Candida parapsilosis NOT DETECTED NOT DETECTED   Candida tropicalis NOT DETECTED NOT DETECTED   Cryptococcus neoformans/gattii NOT DETECTED NOT DETECTED    Shauna Hugh, PharmD, [place]  PGY-1 Pharmacy Resident 10/10/2020 1:30 PM  Please check [email or web address] for unit-specific pharmacy phone numbers.

## 2020-10-11 ENCOUNTER — Ambulatory Visit: Payer: Medicare Other | Admitting: Gastroenterology

## 2020-10-11 ENCOUNTER — Other Ambulatory Visit: Payer: Medicare Other

## 2020-10-11 DIAGNOSIS — K7290 Hepatic failure, unspecified without coma: Secondary | ICD-10-CM | POA: Diagnosis not present

## 2020-10-11 DIAGNOSIS — Z515 Encounter for palliative care: Secondary | ICD-10-CM

## 2020-10-11 DIAGNOSIS — K767 Hepatorenal syndrome: Secondary | ICD-10-CM | POA: Diagnosis not present

## 2020-10-11 DIAGNOSIS — R4182 Altered mental status, unspecified: Secondary | ICD-10-CM | POA: Diagnosis not present

## 2020-10-11 DIAGNOSIS — K7031 Alcoholic cirrhosis of liver with ascites: Secondary | ICD-10-CM

## 2020-10-11 DIAGNOSIS — Z7189 Other specified counseling: Secondary | ICD-10-CM

## 2020-10-11 DIAGNOSIS — I4891 Unspecified atrial fibrillation: Secondary | ICD-10-CM | POA: Diagnosis not present

## 2020-10-11 DIAGNOSIS — Z66 Do not resuscitate: Secondary | ICD-10-CM

## 2020-10-11 DIAGNOSIS — N186 End stage renal disease: Secondary | ICD-10-CM | POA: Diagnosis not present

## 2020-10-11 DIAGNOSIS — G9341 Metabolic encephalopathy: Secondary | ICD-10-CM

## 2020-10-11 DIAGNOSIS — K7682 Hepatic encephalopathy: Secondary | ICD-10-CM

## 2020-10-11 DIAGNOSIS — C220 Liver cell carcinoma: Secondary | ICD-10-CM | POA: Diagnosis not present

## 2020-10-11 LAB — COMPREHENSIVE METABOLIC PANEL
ALT: 37 U/L (ref 0–44)
AST: 77 U/L — ABNORMAL HIGH (ref 15–41)
Albumin: 3.6 g/dL (ref 3.5–5.0)
Alkaline Phosphatase: 57 U/L (ref 38–126)
Anion gap: 15 (ref 5–15)
BUN: 51 mg/dL — ABNORMAL HIGH (ref 8–23)
CO2: 21 mmol/L — ABNORMAL LOW (ref 22–32)
Calcium: 9.3 mg/dL (ref 8.9–10.3)
Chloride: 100 mmol/L (ref 98–111)
Creatinine, Ser: 2.95 mg/dL — ABNORMAL HIGH (ref 0.61–1.24)
GFR, Estimated: 22 mL/min — ABNORMAL LOW (ref >60)
Glucose, Bld: 98 mg/dL (ref 70–99)
Potassium: 3.7 mmol/L (ref 3.5–5.1)
Sodium: 136 mmol/L (ref 135–145)
Total Bilirubin: 6.8 mg/dL — ABNORMAL HIGH (ref 0.3–1.2)
Total Protein: 6.6 g/dL (ref 6.5–8.1)

## 2020-10-11 LAB — CBC
HCT: 26.1 % — ABNORMAL LOW (ref 39.0–52.0)
Hemoglobin: 8.5 g/dL — ABNORMAL LOW (ref 13.0–17.0)
MCH: 35.4 pg — ABNORMAL HIGH (ref 26.0–34.0)
MCHC: 32.6 g/dL (ref 30.0–36.0)
MCV: 108.8 fL — ABNORMAL HIGH (ref 80.0–100.0)
Platelets: 60 10*3/uL — ABNORMAL LOW (ref 150–400)
RBC: 2.4 MIL/uL — ABNORMAL LOW (ref 4.22–5.81)
RDW: 17.3 % — ABNORMAL HIGH (ref 11.5–15.5)
WBC: 6 10*3/uL (ref 4.0–10.5)
nRBC: 0.7 % — ABNORMAL HIGH (ref 0.0–0.2)

## 2020-10-11 LAB — CBC WITH DIFFERENTIAL/PLATELET
Abs Immature Granulocytes: 0.02 10*3/uL (ref 0.00–0.07)
Basophils Absolute: 0 10*3/uL (ref 0.0–0.1)
Basophils Relative: 0 %
Eosinophils Absolute: 0.2 10*3/uL (ref 0.0–0.5)
Eosinophils Relative: 3 %
HCT: 23.1 % — ABNORMAL LOW (ref 39.0–52.0)
Hemoglobin: 7.5 g/dL — ABNORMAL LOW (ref 13.0–17.0)
Immature Granulocytes: 0 %
Lymphocytes Relative: 10 %
Lymphs Abs: 0.5 10*3/uL — ABNORMAL LOW (ref 0.7–4.0)
MCH: 35.2 pg — ABNORMAL HIGH (ref 26.0–34.0)
MCHC: 32.5 g/dL (ref 30.0–36.0)
MCV: 108.5 fL — ABNORMAL HIGH (ref 80.0–100.0)
Monocytes Absolute: 0.5 10*3/uL (ref 0.1–1.0)
Monocytes Relative: 9 %
Neutro Abs: 4.1 10*3/uL (ref 1.7–7.7)
Neutrophils Relative %: 78 %
Platelets: 63 10*3/uL — ABNORMAL LOW (ref 150–400)
RBC: 2.13 MIL/uL — ABNORMAL LOW (ref 4.22–5.81)
RDW: 17.7 % — ABNORMAL HIGH (ref 11.5–15.5)
WBC: 5.3 10*3/uL (ref 4.0–10.5)
nRBC: 0 % (ref 0.0–0.2)

## 2020-10-11 LAB — PROTIME-INR
INR: 2 — ABNORMAL HIGH (ref 0.8–1.2)
Prothrombin Time: 22.3 seconds — ABNORMAL HIGH (ref 11.4–15.2)

## 2020-10-11 LAB — PH, BODY FLUID: pH, Body Fluid: 7.5

## 2020-10-11 LAB — GRAM STAIN

## 2020-10-11 LAB — HEPATITIS B SURFACE ANTIGEN: Hepatitis B Surface Ag: NONREACTIVE

## 2020-10-11 LAB — AMMONIA: Ammonia: 36 umol/L — ABNORMAL HIGH (ref 9–35)

## 2020-10-11 LAB — HEPATITIS B CORE ANTIBODY, TOTAL: Hep B Core Total Ab: NONREACTIVE

## 2020-10-11 LAB — HEPATITIS B SURFACE ANTIBODY,QUALITATIVE: Hep B S Ab: NONREACTIVE

## 2020-10-11 MED ORDER — ALBUMIN HUMAN 25 % IV SOLN
25.0000 g | Freq: Four times a day (QID) | INTRAVENOUS | Status: AC
  Administered 2020-10-11 – 2020-10-12 (×4): 25 g via INTRAVENOUS
  Filled 2020-10-11 (×4): qty 100

## 2020-10-11 MED ORDER — CHLORHEXIDINE GLUCONATE CLOTH 2 % EX PADS
6.0000 | MEDICATED_PAD | Freq: Every day | CUTANEOUS | Status: DC
  Administered 2020-10-14: 6 via TOPICAL

## 2020-10-11 MED ORDER — DARBEPOETIN ALFA 60 MCG/0.3ML IJ SOSY
60.0000 ug | PREFILLED_SYRINGE | INTRAMUSCULAR | Status: DC
  Filled 2020-10-11: qty 0.3

## 2020-10-11 NOTE — TOC Initial Note (Signed)
Transition of Care [facility]) - Initial/Assessment Note    Patient Details  Name: Dillon Perkins MRN: [id number] Date of Birth: 1950/10/15  Transition of Care [facility]) CM/SW Contact:    Bethena Roys, RN Phone Number: 10/11/2020, 4:23 PM  Clinical Narrative:  Risk for readmission assessment completed. Prior to arrival patient was from home with spouse. The patient is new to HD-outpatient [facility] location. Patient is presenting for acute encephalopathy. Palliative Care is following-plan for a meeting with wife on 10-12-20. Case Manager called wife without an answer. Case Manager will continue to follow for additional transition of care needs.                  Expected Discharge Plan: [place] Barriers to Discharge: Continued Medical Work up  Expected Discharge Plan and Services Expected Discharge Plan: [place] In-house Referral: Hospice / Palliative Care Discharge Planning Services: CM Consult   Living arrangements for the past 2 months: Single Family Home                  Prior Living Arrangements/Services Living arrangements for the past 2 months: Single Family Home Lives with:: Spouse Patient language and need for interpreter reviewed:: Yes        Need for Family Participation in Patient Care: Yes (Comment) Care giver support system in place?: Yes (comment)   Criminal Activity/Legal Involvement Pertinent to Current Situation/Hospitalization: No - Comment as needed  Activities of Daily Living Home Assistive Devices/Equipment: None ADL Screening (condition at time of admission) Patient's cognitive ability adequate to safely complete daily activities?: No Is the patient deaf or have difficulty hearing?: No Does the patient have difficulty seeing, even when wearing glasses/contacts?: No Does the patient have difficulty concentrating, remembering, or making decisions?: Yes Patient able to express need for assistance with ADLs?:  No Does the patient have difficulty dressing or bathing?: Yes Independently performs ADLs?: No Communication: Needs assistance Is this a change from baseline?: Change from baseline, expected to last >3 days Dressing (OT): Needs assistance Is this a change from baseline?: Change from baseline, expected to last >3 days Grooming: Needs assistance Is this a change from baseline?: Change from baseline, expected to last >3 days Feeding: Needs assistance Is this a change from baseline?: Change from baseline, expected to last >3 days Bathing: Needs assistance Is this a change from baseline?: Change from baseline, expected to last >3 days Toileting: Needs assistance Is this a change from baseline?: Change from baseline, expected to last >3days In/Out Bed: Needs assistance Is this a change from baseline?: Change from baseline, expected to last >3 days Walks in Home: Needs assistance Is this a change from baseline?: Change from baseline, expected to last >3 days Does the patient have difficulty walking or climbing stairs?: Yes Weakness of Legs: Both Weakness of Arms/Hands: Both  Permission Sought/Granted Permission sought to share information with : Family Nature conservation officer     Alcohol / Substance Use: Not Applicable Psych Involvement: No (comment)  Admission diagnosis:  Cirrhosis ([place]) [K74.60] Delirium [R41.0] ESRD on dialysis ([place]) [N18.6, Z99.2] Atrial fibrillation with RVR ([place]) [I48.91] Altered mental status, unspecified altered mental status type [M35.36] Acute metabolic encephalopathy [R44.31] Patient Active Problem List   Diagnosis Date Noted  . Malnutrition of moderate degree 10/10/2020  . Sepsis ([place]) 10/09/2020  . Delirium 10/09/2020  . Altered mental status   . ESRD on dialysis ([place])   . Stage 3a chronic kidney disease ([place]) 09/05/2020  .  Orthostatic hypotension 06/04/2020  . Kidney stones 05/22/2020  . Hepatocellular carcinoma ([place]) 03/26/2020  . Goals of care,  counseling/discussion 03/26/2020  . Educated about COVID-19 virus infection 11/07/2018  . Dilated cardiomyopathy ([place]) 11/07/2018  . SOB (shortness of breath) 11/07/2018  . Chest pain   . Acute renal insufficiency   . Paroxysmal atrial fibrillation ([facility])   . Bradycardia 07/22/2014  . Exercise intolerance 09/24/2012  . Abnormal CXR 09/06/2012  . Abnormal LFTs (liver function tests) 02/07/2012  . Hyperlipidemia 02/07/2012  . Atrial fibrillation with RVR ([place]) 01/31/2012  . Snoring 01/31/2012  . Essential hypertension 08/29/2010   PCP:  Antony Contras, MD Pharmacy:   [facility], [place] [address] Phone: [phone] Fax: [phone]   Readmission Risk Interventions Readmission Risk Prevention Plan 10/11/2020  Transportation Screening Complete  Medication Review (RN Care Manager) Complete  HRI or [place] Complete  SW Recovery Care/Counseling Consult Complete  Palliative Care Screening Complete  [place] Not Applicable  Some recent data might be hidden

## 2020-10-11 NOTE — Progress Notes (Signed)
[facility] Progress Note   Subjective:   Patient seen and examined at bedside in dialysis. Wakes briefly with verbal stimuli and returns back to sleep.   Objective Vitals:   10/11/20 1130 10/11/20 1200 10/11/20 1230 10/11/20 1312  BP: (!) 94/54 (!) 89/63 (!) 103/57 (!) 100/59  Pulse: 84 85  92  Resp: 11 11  11   Temp:    (!) 97.4 F (36.3 C)  TempSrc:    Axillary  SpO2: 98% 98%  100%  Weight:      Height:       Physical Exam General:chronically ill appearing male in NAD Heart:RRR, no mrg Lungs:CTAB anterolaterally  Abdomen:soft, +distended, non tender Extremities:no LE edema Dialysis Access: [facility] accessed   Filed Weights   10/10/20 0910 10/11/20 0431 10/11/20 0950  Weight: 86.8 kg 82.6 kg 81.8 kg    Intake/Output Summary (Last 24 hours) at 10/11/2020 1439 Last data filed at 10/11/2020 1257 Gross per 24 hour  Intake 380 ml  Output 1700 ml  Net -1320 ml    Additional Objective Labs: Basic Metabolic Panel: Recent Labs  Lab 10/09/20 2023 10/10/20 0547 10/10/20 1254 10/11/20 0554  NA 133* 136  --  136  K 4.8 4.6  --  3.7  CL 100 104  --  100  CO2 21* 21*  --  21*  GLUCOSE 113* 107*  --  98  BUN 74* 78*  --  51*  CREATININE 3.21* 3.41*  --  2.95*  CALCIUM 8.7* 9.2  --  9.3  PHOS 4.9*  --  3.3  --    Liver Function Tests: Recent Labs  Lab 10/09/20 1055 10/09/20 2023 10/10/20 0547 10/11/20 0554  AST 61*  --  85* 77*  ALT 46*  --  44 37  ALKPHOS 106  --  85 57  BILITOT 4.9*  --  6.4* 6.8*  PROT 7.2  --  6.7 6.6  ALBUMIN 2.2* 2.1* 2.8* 3.6   Recent Labs  Lab 10/09/20 1055  LIPASE 68*   CBC: Recent Labs  Lab 10/09/20 1055 10/09/20 1412 10/09/20 2023 10/10/20 0547 10/11/20 0554  WBC 7.2  --  7.1 6.9 5.3  NEUTROABS 6.2  --   --  5.5 4.1  HGB 10.1*   < > 9.9* 9.1* 7.5*  HCT 30.1*   < > 28.7* 26.6* 23.1*  MCV 106.4*  --  104.7* 106.0* 108.5*  PLT 106*  --  99* 91* 63*   < > = values in this interval not displayed.   Blood  Culture    Component Value Date/Time   SDES FLUID ABDOMEN PERITONEAL 10/10/2020 1510   SDES FLUID PERITONEAL ABDOMEN 10/10/2020 1510   SPECREQUEST NONE 10/10/2020 1510   SPECREQUEST NONE 10/10/2020 1510   CULT  10/10/2020 1510    NO GROWTH < 12 HOURS Performed at [facility], [place] [address] [place], [place] [address]    REPTSTATUS 10/11/2020 FINAL 10/10/2020 1510   REPTSTATUS PENDING 10/10/2020 1510   CBG: Recent Labs  Lab 10/09/20 0851  GLUCAP 110*   Iron Studies:  Recent Labs    10/10/20 1254  IRON 123  TIBC 125*  FERRITIN 943*   Lab Results  Component Value Date   INR 2.0 (H) 10/11/2020   INR 1.9 (H) 10/10/2020   INR 1.7 (H) 10/09/2020   Studies/Results: US LIVER DOPPLER  Result Date: 10/10/2020 CLINICAL DATA:  Cirrhosis of liver with ascites EXAM: DUPLEX ULTRASOUND OF LIVER TECHNIQUE: Color and duplex Doppler  ultrasound was performed to evaluate the hepatic in-flow and out-flow vessels. COMPARISON:  CT abdomen/pelvis yesterday FINDINGS: Liver: Diffusely nodular liver contour. The liver appears small in the parenchyma is diffusely heterogeneous and echogenic. Hypoechoic soft tissue mass in the left upper lobe measures 4.8 x 4.4 x 5.0 cm. Hypoechoic solid mass in the right hepatic lobe measures approximately 4.9 x 5.7 x 8.3 cm. Diffusely thickened gallbladder wall. Sludge is present within the gallbladder lumen. The common bile duct is normal at 3 mm. Main Portal Vein size: 1.2 cm Portal Vein Velocities Main Prox:  41 cm/sec with hepatopetal directional flow. Main Mid: 55 cm/sec with hepatopetal directional flow. Main Dist:  71 cm/sec with hepatopetal directional flow. Right: Unable to visualize Left: 28 cm/sec with hepatopetal directional flow. Hepatic Vein Velocities Right:  27 cm/sec Middle:  32 cm/sec Left:  68 cm/sec IVC: Present and patent with normal respiratory phasicity. Hepatic Artery Velocity:  109 cm/sec Splenic Vein Velocity:  34 cm/sec Spleen: 8.4 cm x 12.0  cm x 8.2 cm with a total volume of 436 cm^3 (411 cm^3 is upper limit normal) Portal Vein Occlusion/Thrombus: No Splenic Vein Occlusion/Thrombus: No Ascites: None Varices: Moderate ascites. IMPRESSION: 1. 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 2. Portal veins remain patent with hepatopetal directional flow. 3. Previously treated left hepatic mass measures approximately 4.8 x 4.4 x 5.0 cm. Viability of the lesion cannot be assessed with ultrasound. 4. Right hepatic mass measuring approximately 4.9 x 5.7 x 8.3 cm not identified on prior imaging. Recommend further evaluation with gadolinium-enhanced MRI of the abdomen when the patient's clinical status is improved, preferably as an outpatient to facilitate optimal imaging quality and diagnostic sensitivity. Electronically Signed   By: Jacqulynn Cadet M.D.   On: 10/10/2020 07:45   IR Paracentesis  Result Date: 10/10/2020 INDICATION: History of multifocal hepatocellular cancer. Recurrent ascites. Request for diagnostic and therapeutic paracentesis. EXAM: ULTRASOUND GUIDED LEFT LOWER QUADRANT PARACENTESIS MEDICATIONS: 1% plain lidocaine, 5 mL COMPLICATIONS: None immediate. PROCEDURE: Informed written consent was obtained from the patient after a discussion of the risks, benefits and alternatives to treatment. A timeout was performed prior to the initiation of the procedure. Initial ultrasound scanning demonstrates a large amount of ascites within the left lower abdominal quadrant. The left lower abdomen was prepped and draped in the usual sterile fashion. 1% lidocaine was used for local anesthesia. Following this, a 19 gauge, 7-cm, Yueh catheter was introduced. An ultrasound image was saved for documentation purposes. The paracentesis was performed. The catheter was removed and a dressing was applied. The patient tolerated the procedure well without immediate post procedural complication. FINDINGS: A total of approximately  5.8 L of clear yellow fluid was removed. Samples were sent to the laboratory as requested by the clinical team. IMPRESSION: Successful ultrasound-guided paracentesis yielding 5.8 liters of peritoneal fluid. Read by: Ascencion Dike PA-C Electronically Signed   By: Sandi Mariscal M.D.   On: 10/10/2020 15:40    Medications: . sodium chloride    . sodium chloride    . albumin human 25 g (10/11/20 0903)  . ceFEPime (MAXIPIME) IV 1 g (10/11/20 1435)  . phytonadione (VITAMIN K) IV Stopped (10/11/20 1437)   . (feeding supplement) PROSource Plus  30 mL Oral BID BM  . Chlorhexidine Gluconate Cloth  6 each Topical Q0600  . [START ON 10/12/2020] darbepoetin (ARANESP) injection - DIALYSIS  60 mcg Intravenous Q Sat-HD  . feeding supplement  1 Container Oral TID BM  . feeding  supplement  237 mL Oral TID BM  . lactulose  20 g Oral TID  . midodrine  10 mg Oral TID  . multivitamin  1 tablet Oral QHS  . phytonadione  5 mg Oral Once  . rifaximin  550 mg Oral BID    Dialysis Orders: TTS at [facility] -- brand new, only had 1 treatment there on 4/5, establishing orders 2.5hr today -> 3hr -> 3.5hr. EDW ~87kg, 2K/2.5Ca, TDC, no heparin  Assessment/Plan: 1. Sepsis/Bacteremia: BCx 4/6 growing GPC in 1 of 2 bottles. On Vanc/Cefepime. Repeat paracentesis with yield 5.8L yesterday. Peritoneal fluid culture with NGTD. 2. Hepatic encephalopathy/decompensated liver failure: Hx cirrhosis/HCC s/p immunotherapy: Ammonia 190 on admit -> now 86 -> 79 > 36, follow. S/p Vit K. On lactulose and rifaximin, per primary.  Per oncology to have MRI with & w/o contrast for staging tomorrow with dialysis to follow.  3. A-Fib RVR: S/p diltiazem drip, improving. 4. ESRD: New start -> titrating up dialysis Rx -> HD today and again tomorrow to get back on schedule.  Use increased K bath.  5. HTN/volume: BP low/stable, on midodrine 10mg  TID. Albumin prn for BP support. UF with HD as tolerated. 6. Anemia of ESRD: Hgb drop 7.5, tsat 99%, no iron.  Aranesp 52mcg starting tomorrow.  Transfuse prn.  7. Secondary hyperparathyroidism: CorrCa borderline high, Phos ok without binders. 8. Nutrition: Alb low, will add protein supplements once eating.  Jen Mow, PA-C [facility] 10/11/2020,2:39 PM  LOS: 2 days

## 2020-10-11 NOTE — Evaluation (Signed)
Occupational Therapy Evaluation Patient Details Name: Dillon Perkins MRN: [id number] DOB: 03/23/1951 Today's Date: 10/11/2020    History of Present Illness Pt is a 70 y.o. male admitted 10/09/20 with AMS; wife reports lethargy and poor appetite the past month. Head CT negative for acute abnormality. Abdominal CT with large volume ascites, cirrhosis. Workup for acute hepatic encephalopathy, afib with RVR. Per oncology, pt has likely developed hepatorenal syndrome due to worsening HCC. Pt hypotensive during HD 4/7. S/p paracentesis from LLQ on 4/7. PMH includes hepatocellular carcinoma (s/p radioembolization; immunotherapy), alcoholic cirrhosis w/ portal HTN, PAF, CKD V (recently started HD TTS).   Clinical Impression   Pt admitted to ED for concerns listed above. PTA pt was independent in all ADL's and IADL's, and driving. Pt reported that within the past month he has felt increasingly weak and tired, which family now believes was due to the medical concerns listed above. At the time of the evaluation, pt was able to complete all grooming, toileting, and functional mobility tasks with modified independence. Pt and family were educated on energy conservation and potential DME needs if fatigue continues when discharged. Pt does not require skilled OT services at this time and will be discharged from acute OT.      Follow Up Recommendations  No OT follow up    Equipment Recommendations  Tub/shower seat  Pt and family request a shower seat incase pt has continued fatigue once d/c'd for safety with bathing.    Recommendations for Other Services       Precautions / Restrictions Precautions Precautions: Fall Restrictions Weight Bearing Restrictions: No      Mobility Bed Mobility Overal bed mobility: Modified Independent             General bed mobility comments: HOB elevated 20 degrees and use of railings to assist with sitting up.    Transfers Overall transfer level: Modified  independent Equipment used: None             General transfer comment: Pt able to transfer to recliner/toilet with no assistance.    Balance Overall balance assessment: Mild deficits observed, not formally tested                                         ADL either performed or assessed with clinical judgement   ADL Overall ADL's : Modified independent                                       General ADL Comments: Pt is able to complete simulated toilet transfer to the recliner, stand at the sink and complete grooming, and complete bathing while seated. Additionally pt has no difficulties feeding himself or completing UE and LE dressing sitting/standing at EOB.     Vision Baseline Vision/History: No visual deficits Patient Visual Report: No change from baseline Vision Assessment?: No apparent visual deficits     Perception Perception Perception Tested?: No   Praxis Praxis Praxis tested?: Not tested    Pertinent Vitals/Pain Pain Assessment: No/denies pain     Hand Dominance Right   Extremity/Trunk Assessment Upper Extremity Assessment Upper Extremity Assessment: Overall WFL for tasks assessed   Lower Extremity Assessment Lower Extremity Assessment: Defer to PT evaluation   Cervical / Trunk Assessment Cervical / Trunk Assessment: Normal   Communication  Communication Communication: No difficulties   Cognition Arousal/Alertness: Awake/alert Behavior During Therapy: WFL for tasks assessed/performed Overall Cognitive Status: Impaired/Different from baseline Area of Impairment: Memory;Safety/judgement;Awareness                     Memory: Decreased short-term memory   Safety/Judgement: Decreased awareness of safety;Decreased awareness of deficits Awareness: Emergent   General Comments: Pt is able to follow simple and multistep commands consistantly and answer questions appropriately. Pt has no recall of the past week+. Pt  believes that he has no deficits and is normal, despite demonstrating some balance concerns.   General Comments  VSS on RA    Exercises     Shoulder Instructions      Home Living Family/patient expects to be discharged to:: Private residence Living Arrangements: Spouse/significant other Available Help at Discharge: Family;Available PRN/intermittently Type of Home: House Home Access: Stairs to enter [employer] of Steps: 5 Entrance Stairs-Rails: Can reach both Home Layout: One level     Bathroom Shower/Tub: Walk-in shower;Tub only   Bathroom Toilet: Handicapped height Bathroom Accessibility: Yes How Accessible: Accessible via walker Home Equipment: None          Prior Functioning/Environment Level of Independence: Independent                 OT Problem List: Decreased strength;Decreased activity tolerance;Impaired balance (sitting and/or standing);Decreased cognition;Decreased safety awareness      OT Treatment/Interventions:      OT Goals(Current goals can be found in the care plan section) Acute Rehab OT Goals Patient Stated Goal: To feel normal and go home OT Goal Formulation: With patient/family  OT Frequency:     Barriers to D/C:            Co-evaluation              AM-PAC OT "6 Clicks" Daily Activity     Outcome Measure Help from another person eating meals?: None Help from another person taking care of personal grooming?: None Help from another person toileting, which includes using toliet, bedpan, or urinal?: None Help from another person bathing (including washing, rinsing, drying)?: None Help from another person to put on and taking off regular upper body clothing?: None Help from another person to put on and taking off regular lower body clothing?: None 6 Click Score: 24   End of Session Equipment Utilized During Treatment: Gait belt Nurse Communication: Mobility status;Other (comment) (rectal tube drainage.)  Activity  Tolerance: Patient tolerated treatment well Patient left: in bed;with nursing/sitter in room;with family/visitor present  OT Visit Diagnosis: Unsteadiness on feet (R26.81);Other abnormalities of gait and mobility (R26.89);Muscle weakness (generalized) (M62.81)                Time: 2094-7096 OT Time Calculation (min): 44 min Charges:  OT General Charges $OT Visit: 1 Visit OT Evaluation $OT Eval Low Complexity: 1 Low OT Treatments $Self Care/Home Management : 23-37 mins  Oswaldo Cueto H., OTR/L Acute Rehabilitation  Anneliese Leblond Elane Kalianna Verbeke 10/11/2020, 10:56 AM

## 2020-10-11 NOTE — Plan of Care (Signed)

## 2020-10-11 NOTE — Progress Notes (Signed)
SLP Cancellation Note  Patient Details Name: SHAUNN TACKITT MRN: [id number] DOB: June 14, 1951   Cancelled treatment:       Reason Eval/Treat Not Completed: Patient at procedure or test/unavailable (leaving for HD upon SLP arrival). Will f/u as able.    Osie Bond., M.A. [facility] Pager [phone] Office [phone]  10/11/2020, 9:43 AM

## 2020-10-11 NOTE — Consult Note (Signed)
Palliative Medicine Inpatient Consult Note  Reason for consult:  Discuss goals of care  HPI:  Per intake H&P --> Dillon Perkins is a 70 y.o. male with medical history significant of hepatocellular carcinoma status post radio embolization, immunotherapy with Nivolumab x10 cycles last cycle was September 04, 6211, alcoholic cirrhosis with portal hypertension, PAF noncompliant with Cardizem, CKD stage V starting dialysis, presented with altered mentation.  Palliative care was asked to get involved in the setting of suspected advancing multifocal hepatocellular carcinoma to further address goals of care.  Clinical Assessment/Goals of Care:  *Please note that this is a verbal dictation therefore any spelling or grammatical errors are due to the "[place] [employer]" system interpretation.  I have reviewed medical records including EPIC notes, labs and imaging, received report from bedside RN, assessed the patient who was alert and oriented this morning.    I met with Dillon Perkins to further discuss diagnosis prognosis, GOC, EOL wishes, disposition and options.   I introduced Palliative Medicine as specialized medical care for people living with serious illness. It focuses on providing relief from the symptoms and stress of a serious illness. The goal is to improve quality of life for both the patient and the family.  Dillon Perkins shares with me that he is from [place] originally. He is married and has been with his wife, Dillon Perkins for the past thirty-four years. They share three son together and four grandchildren. He is a retired Printmaker. He enjoys spending time with his grandchildren and being outside. He is a man of faith and practices within the [employer] denomination.   Prior to admission Dillon Perkins was independent of all bADL's. He shares that he had been able to mobilize easily throughout his home.   A detailed discussion was had today regarding advanced directives - these can be  found in [place] and were completed on 06/14/2020.    Concepts specific to code status, artifical feeding and hydration, continued IV antibiotics and rehospitalization was had. We completed a MOST form together as below:  Cardiopulmonary Resuscitation: Do Not Attempt Resuscitation (DNR/No CPR)  Medical Interventions: Limited Additional Interventions: Use medical treatment, IV fluids and cardiac monitoring as indicated, DO NOT USE intubation or mechanical ventilation. May consider use of less invasive airway support such as BiPAP or CPAP. Also provide comfort measures. Transfer to the hospital if indicated. Avoid intensive care.   Antibiotics: Determine use of limitation of antibiotics when infection occurs  IV Fluids: IV fluids for a defined trial period  Feeding Tube: No feeding tube    The difference between a aggressive medical intervention path  and a palliative comfort care path for this patient at this time was had. We reviewed that he is in a tough situation with both his cirrhosis and his hepatocellular carcinoma. He expressed that he knows his disease has advanced and he may be broaching the end of the road.   Discussed the importance of continued conversation with family and their  medical providers regarding overall plan of care and treatment options, ensuring decisions are within the context of the patients values and GOCs. ________________________________________________________________________________________ Addendum:  I called patients spouse, Dillon Perkins and spoke to her about Dillon Perkins and I's conversation this morning. I updated her on the change in code status.  Dillon Perkins and I discussed worst case scenario's of hepatocellular carcinoma worsening and the potential need for hospice care.   We further discussed that at this time we will plan for OP palliative care to reach out  to her to establish a line of communication for her should hospice he needed at any point.   We reviewed if  things remain stable that Dillon Perkins is unable to transport Dillon Perkins to and from HD - I shared with her that I will request CSW reach out to her in regards to this.   We have further agreed to meet tomorrow at 9AM.   Decision Maker: Dillon Perkins (Spouse) [phone]  SUMMARY OF RECOMMENDATIONS   DNAR/DNI  MOST Completed, paper copy placed onto the chart electric copy can be found in [facility]  DNR Form Completed, paper copy placed onto the chart electric copy can be found in [facility]  TOC - OP Palliative care consult   Appreciate MSW aiding in HD transportation options  Ongoing conversations pending MRI results, very likely possibility of Hospice recommendation which patient is prepared for  Code Status/Advance Care Planning: DNAR/DNI    Palliative Prophylaxis:   Oral Care, Mobility  Additional Recommendations (Limitations, Scope, Preferences):  Continue current scope of care   Psycho-social/Spiritual:   Desire for further Chaplaincy support: Yes  Additional Recommendations: Continue current scope of care   Prognosis: Very poor, hospice is a reasonable consideration at this point  Discharge Planning: PT/OT pending  Vitals:   10/10/20 2334 10/11/20 0431  BP: 127/68 102/74  Pulse: 90 81  Resp: 20 19  Temp: 97.6 F (36.4 C) 98 F (36.7 C)  SpO2: 99% 100%    Intake/Output Summary (Last 24 hours) at 10/11/2020 2542 Last data filed at 10/11/2020 0446 Gross per 24 hour  Intake 717.5 ml  Output 1202 ml  Net -484.5 ml   Last Weight  Most recent update: 10/11/2020  4:32 AM   Weight  82.6 kg (182 lb 2.6 oz)           Gen:  Elderly M  HEENT: moist mucous membranes CV: Regular rate and rhythm  PULM: clear to auscultation bilaterally  ABD: Distended EXT: No edema  Neuro: Alert and oriented x3   PPS: 50%   This conversation/these recommendations were discussed with patient primary care team, Dr. Nevada Crane  Time In: 0900 Time Out: 1010 Total Time: 70 Greater than 50%   of this time was spent counseling and coordinating care related to the above assessment and plan.  [place] Team Team Cell Phone: [phone] Please utilize secure chat with additional questions, if there is no response within 30 minutes please call the above phone number  Palliative Medicine Team providers are available by phone from 7am to 7pm daily and can be reached through the team cell phone.  Should this patient require assistance outside of these hours, please call the patient's attending physician.

## 2020-10-11 NOTE — Progress Notes (Signed)
PROGRESS NOTE  Dillon Perkins OAC:[id number] DOB: 04-03-1951 DOA: 10/09/2020 PCP: Antony Contras, MD  HPI/Recap of past 24 hours: Dillon Perkins is a 70 y.o. male with medical history significant of hepatocellular carcinoma status post radio embolization, immunotherapy with Nivolumab x10 cycles last cycle was September 04, 107, alcoholic cirrhosis with portal hypertension, PAF, CKD stage V starting dialysis, presented with acute hepatic encephalopathy with ammonia level of 190.  Due to concern for active infective process he was started on IV antibiotics empirically cefepime and IV vancomycin.  In the ED he was in A. fib RVR was started on Cardizem drip.  CT head was negative for any acute intracranial findings.  Patient was started on lactulose enema with improvement of his ammonia level.  His mentation also improved.  Post paracentesis with 5.8L of fluid removed, peritoneal fluid sent for analysis.  He also received IV vitamin K 5 mg daily x3 doses due to coagulopathy with INR of 1.9.  10/11/20: Patient was seen and examined at his bedside while at the hemodialysis center.  He denies having any abdominal pain or any cardiopulmonary symptoms.  He is alert and oriented x3.    Assessment/Plan: Active Problems:   Atrial fibrillation with RVR (HCC)   Sepsis (HCC)   Delirium   Malnutrition of moderate degree  Resolved acute hepatic encephalopathy likely secondary to medication noncompliance with lactulose. Presented with elevated ammonia level, 190 He has received lactulose enema with improvement of ammonia level Currently on IV antibiotics empirically, IV vancomycin and cefepime. Mentation is currently back to baseline. Continue to follow peritoneal fluid culture. Continue IV albumin to maintain MAP greater than 65.  Decompensated alcoholic cirrhosis with HCC Meld score of 31 Continue lactulose and rifaximin. Continue to follow peritoneal fluid cultures. Continue sodium restricted  diet. Goal 2-3 soft stools per day while on lactulose, titrate to achieve that goal. Appreciate GI and medical oncology's assistance.  Worsening coagulopathy in the setting of [place] INR worsening despite IV vitamin K INR 2.0 from 1.7. Continue IV vitamin K 5 mg daily. Repeat INR in the morning  Gram-positive cocci in cluster positive blood culture 1 out of 2 bottles Blood culture drawn on 10/09/2020 + for gram positive cocci in cluster 1 out of 2 bottles, aerobic bottle only.  Possibly contaminant. Continue to closely monitor IV vancomycin was restarted on 10/10/2020.  History of staph hominis bacterial peritonitis on 08/02/20 With resistance to oxacillin IV vancomycin restarted on 10/10/2020. Appreciate pharmacy's assistance with dosing  Resolved paroxysmal A. fib with RVR Initially on Cardizem drip, stopped on 10/10/2020 Unable to start Lopressor 12.5 mg twice daily due to hypotension. Continue to monitor on telemetry.  Hyperbilirubinemia with jaundice T bili is uptrending 6.8 from 6.4. AST to ALT 2:1 ratio  Resolved hyperammonemia in the setting of decompensated alcoholic cirrhosis Presented with ammonia level 190 Goal 2-3 soft stools per day while on lactulose, titrate to achieve that goal. Repeat ammonia level 36 on 10/11/2020. Continue lactulose and rifaximin  New ESRD on hemodialysis Tuesday Thursday Saturday HD on 10/10/2020 and on 10/11/2020. Likely HD on 10/12/2020.  Mild anion gap metabolic acidosis Serum bicarb 21, anion gap 15 Electrolytes addressed with hemodialysis Appreciate nephrology's assistance.  Chronic hypotension on midodrine Continue midodrine Continue IV albumin for now. Maintain MAP greater than 65.  Physical debility PT OT to assess now that his mentation is back to baseline  Continue fall precautions  Goals of care Palliative care team consult to assist with establishing goals of  care. Currently the patient is full code.  Code Status: Full  code.  Family Communication: Updated his wife via phone on 10/10/2020.  Disposition Plan: Likely will discharge to home with home health services once GI and medical oncology sign off.   Consultants:  GI.  Nephrology.  IR for diagnostic paracentesis  Palliative care team.  Procedures:  HD on 10/10/2020, on 10/11/2020.  Antimicrobials:  IV vancomycin  IV cefepime.  DVT prophylaxis: SCDs.  Status is: Inpatient   Dispo:  Patient From: Home  Planned Disposition: Home with [facility] when hemodynamically stable and GI medical oncology have signed off.  Medically stable for discharge: No, ongoing management of acute hepatic encephalopathy.          Objective: Vitals:   10/11/20 1130 10/11/20 1200 10/11/20 1230 10/11/20 1312  BP: (!) 94/54 (!) 89/63 (!) 103/57 (!) 100/59  Pulse: 84 85  92  Resp: 11 11  11   Temp:    (!) 97.4 F (36.3 C)  TempSrc:    Axillary  SpO2: 98% 98%  100%  Weight:      Height:        Intake/Output Summary (Last 24 hours) at 10/11/2020 1320 Last data filed at 10/11/2020 1257 Gross per 24 hour  Intake 380 ml  Output 1700 ml  Net -1320 ml   Filed Weights   10/10/20 0910 10/11/20 0431 10/11/20 0950  Weight: 86.8 kg 82.6 kg 81.8 kg    Exam:  . General: 70 y.o. year-old male chronically ill-appearing in no acute distress.  He is alert oriented x3.  Icteric sclera.   . Cardiovascular: Irregular rate and rhythm no rubs or gallops. Marland Kitchen Respiratory: Clear to auscultation no wheezes or rales. . Abdomen: Soft nontender no bowel sounds present.   . Musculoskeletal: No lower extremity edema bilaterally. . Skin: No ulcerative lesions noted.  Jaundice noted. Marland Kitchen Psychiatry: Mood is appropriate for condition and setting.   Data Reviewed: CBC: Recent Labs  Lab 10/09/20 1055 10/09/20 1412 10/09/20 2023 10/10/20 0547 10/11/20 0554  WBC 7.2  --  7.1 6.9 5.3  NEUTROABS 6.2  --   --  5.5 4.1  HGB 10.1* 9.2* 9.9* 9.1* 7.5*  HCT 30.1* 27.0*  28.7* 26.6* 23.1*  MCV 106.4*  --  104.7* 106.0* 108.5*  PLT 106*  --  99* 91* 63*   Basic Metabolic Panel: Recent Labs  Lab 10/09/20 1055 10/09/20 1412 10/09/20 2023 10/10/20 0547 10/10/20 1254 10/11/20 0554  NA 133* 136 133* 136  --  136  K 4.9 5.0 4.8 4.6  --  3.7  CL 102  --  100 104  --  100  CO2 21*  --  21* 21*  --  21*  GLUCOSE 120*  --  113* 107*  --  98  BUN 76*  --  74* 78*  --  51*  CREATININE 3.35*  --  3.21* 3.41*  --  2.95*  CALCIUM 8.7*  --  8.7* 9.2  --  9.3  PHOS  --   --  4.9*  --  3.3  --    GFR: Estimated Creatinine Clearance: 22.2 mL/min (A) (by C-G formula based on SCr of 2.95 mg/dL (H)). Liver Function Tests: Recent Labs  Lab 10/09/20 1055 10/09/20 2023 10/10/20 0547 10/11/20 0554  AST 61*  --  85* 77*  ALT 46*  --  44 37  ALKPHOS 106  --  85 57  BILITOT 4.9*  --  6.4* 6.8*  PROT  7.2  --  6.7 6.6  ALBUMIN 2.2* 2.1* 2.8* 3.6   Recent Labs  Lab 10/09/20 1055  LIPASE 68*   Recent Labs  Lab 10/09/20 1055 10/09/20 2023 10/10/20 0547 10/11/20 0554  AMMONIA 190* 86* 79* 36*   Coagulation Profile: Recent Labs  Lab 10/09/20 1042 10/10/20 0547 10/11/20 0554  INR 1.7* 1.9* 2.0*   Cardiac Enzymes: No results for input(s): CKTOTAL, CKMB, CKMBINDEX, TROPONINI in the last 168 hours. BNP (last 3 results) No results for input(s): PROBNP in the last 8760 hours. HbA1C: No results for input(s): HGBA1C in the last 72 hours. CBG: Recent Labs  Lab 10/09/20 0851  GLUCAP 110*   Lipid Profile: No results for input(s): CHOL, HDL, LDLCALC, TRIG, CHOLHDL, LDLDIRECT in the last 72 hours. Thyroid Function Tests: No results for input(s): TSH, T4TOTAL, FREET4, T3FREE, THYROIDAB in the last 72 hours. Anemia Panel: Recent Labs    10/10/20 1254  FERRITIN 943*  TIBC 125*  IRON 123   Urine analysis:    Component Value Date/Time   COLORURINE AMBER (A) 10/09/2020 1153   APPEARANCEUR CLEAR 10/09/2020 1153   LABSPEC 1.016 10/09/2020 1153    PHURINE 5.0 10/09/2020 1153   GLUCOSEU NEGATIVE 10/09/2020 1153   HGBUR NEGATIVE 10/09/2020 1153   [place] 10/09/2020 1153   KETONESUR NEGATIVE 10/09/2020 1153   PROTEINUR NEGATIVE 10/09/2020 1153   UROBILINOGEN 1.0 07/23/2014 0402   NITRITE NEGATIVE 10/09/2020 1153   LEUKOCYTESUR SMALL (A) 10/09/2020 1153   Sepsis Labs: @LABRCNTIP (procalcitonin:4,lacticidven:4)  ) Recent Results (from the past 240 hour(s))  Blood Culture (routine x 2)     Status: None (Preliminary result)   Collection Time: 10/09/20  9:44 AM   Specimen: BLOOD  Result Value Ref Range Status   Specimen Description BLOOD RIGHT ANTECUBITAL  Final   Special Requests   Final    BOTTLES DRAWN AEROBIC AND ANAEROBIC Blood Culture results may not be optimal due to an inadequate volume of blood received in culture bottles   Culture  Setup Time   Final    GRAM POSITIVE COCCI IN CLUSTERS AEROBIC BOTTLE ONLY Organism ID to follow CRITICAL RESULT CALLED TO, READ BACK BY AND VERIFIED WITHMarisue Ivan PHARMD 1301 10/10/20 A BROWNING Performed at [facility], [place] [address], [place],  [address]    Culture GRAM POSITIVE COCCI  Final   Report Status PENDING  Incomplete  Blood Culture ID Panel (Reflexed)     Status: Abnormal   Collection Time: 10/09/20  9:44 AM  Result Value Ref Range Status   Enterococcus faecalis NOT DETECTED NOT DETECTED Final   Enterococcus Faecium NOT DETECTED NOT DETECTED Final   Listeria monocytogenes NOT DETECTED NOT DETECTED Final   Staphylococcus species DETECTED (A) NOT DETECTED Final    Comment: CRITICAL RESULT CALLED TO, READ BACK BY AND VERIFIED WITH: Marisue Ivan PHARMD 1301 10/10/20 A BROWNING    Staphylococcus aureus (BCID) NOT DETECTED NOT DETECTED Final   Staphylococcus epidermidis NOT DETECTED NOT DETECTED Final   Staphylococcus lugdunensis NOT DETECTED NOT DETECTED Final   Streptococcus species NOT DETECTED NOT DETECTED Final   Streptococcus agalactiae NOT DETECTED NOT  DETECTED Final   Streptococcus pneumoniae NOT DETECTED NOT DETECTED Final   Streptococcus pyogenes NOT DETECTED NOT DETECTED Final   A.calcoaceticus-baumannii NOT DETECTED NOT DETECTED Final   Bacteroides fragilis NOT DETECTED NOT DETECTED Final   Enterobacterales NOT DETECTED NOT DETECTED Final   Enterobacter cloacae complex NOT DETECTED NOT DETECTED Final   Escherichia coli NOT  DETECTED NOT DETECTED Final   Klebsiella aerogenes NOT DETECTED NOT DETECTED Final   Klebsiella oxytoca NOT DETECTED NOT DETECTED Final   Klebsiella pneumoniae NOT DETECTED NOT DETECTED Final   Proteus species NOT DETECTED NOT DETECTED Final   Salmonella species NOT DETECTED NOT DETECTED Final   Serratia marcescens NOT DETECTED NOT DETECTED Final   Haemophilus influenzae NOT DETECTED NOT DETECTED Final   Neisseria meningitidis NOT DETECTED NOT DETECTED Final   Pseudomonas aeruginosa NOT DETECTED NOT DETECTED Final   Stenotrophomonas maltophilia NOT DETECTED NOT DETECTED Final   Candida albicans NOT DETECTED NOT DETECTED Final   Candida auris NOT DETECTED NOT DETECTED Final   Candida glabrata NOT DETECTED NOT DETECTED Final   Candida krusei NOT DETECTED NOT DETECTED Final   Candida parapsilosis NOT DETECTED NOT DETECTED Final   Candida tropicalis NOT DETECTED NOT DETECTED Final   Cryptococcus neoformans/gattii NOT DETECTED NOT DETECTED Final    Comment: Performed at [facility], [place] [address], [place], [place] [address]  Blood Culture (routine x 2)     Status: None (Preliminary result)   Collection Time: 10/09/20  9:51 AM   Specimen: BLOOD RIGHT HAND  Result Value Ref Range Status   Specimen Description BLOOD RIGHT HAND  Final   Special Requests   Final    BOTTLES DRAWN AEROBIC AND ANAEROBIC Blood Culture adequate volume   Culture   Final    NO GROWTH 2 DAYS Performed at [facility], [place] [address] [place], [place] [address]    Report Status PENDING  Incomplete  Resp Panel by RT-PCR (Flu  A&B, Covid) Nasopharyngeal Swab     Status: None   Collection Time: 10/09/20  9:51 AM   Specimen: Nasopharyngeal Swab; Nasopharyngeal(NP) swabs in vial transport medium  Result Value Ref Range Status   SARS Coronavirus 2 by RT PCR NEGATIVE NEGATIVE Final    Comment: (NOTE) SARS-CoV-2 target nucleic acids are NOT DETECTED.  The SARS-CoV-2 RNA is generally detectable in upper respiratory specimens during the acute phase of infection. The lowest concentration of SARS-CoV-2 viral copies this assay can detect is 138 copies/mL. A negative result does not preclude SARS-Cov-2 infection and should not be used as the sole basis for treatment or other patient management decisions. A negative result may occur with  improper specimen collection/handling, submission of specimen other than nasopharyngeal swab, presence of viral mutation(s) within the areas targeted by this assay, and inadequate number of viral copies(<138 copies/mL). A negative result must be combined with clinical observations, patient history, and epidemiological information. The expected result is Negative.  Fact Sheet for Patients:  EntrepreneurPulse.com.au  Fact Sheet for Healthcare Providers:  IncredibleEmployment.be  This test is no t yet approved or cleared by the [place] FDA and  has been authorized for detection and/or diagnosis of SARS-CoV-2 by FDA under an Emergency Use Authorization (EUA). This EUA will remain  in effect (meaning this test can be used) for the duration of the COVID-19 declaration under Section 564(b)(1) of the Act, 21 U.S.C.section 360bbb-3(b)(1), unless the authorization is terminated  or revoked sooner.       Influenza A by PCR NEGATIVE NEGATIVE Final   Influenza B by PCR NEGATIVE NEGATIVE Final    Comment: (NOTE) The Xpert Xpress SARS-CoV-2/FLU/RSV plus assay is intended as an aid in the diagnosis of influenza from Nasopharyngeal swab specimens  and should not be used as a sole basis for treatment. Nasal washings and aspirates are unacceptable for Xpert Xpress SARS-CoV-2/FLU/RSV  testing.  Fact Sheet for Patients: EntrepreneurPulse.com.au  Fact Sheet for Healthcare Providers: IncredibleEmployment.be  This test is not yet approved or cleared by the [place] FDA and has been authorized for detection and/or diagnosis of SARS-CoV-2 by FDA under an Emergency Use Authorization (EUA). This EUA will remain in effect (meaning this test can be used) for the duration of the COVID-19 declaration under Section 564(b)(1) of the Act, 21 U.S.C. section 360bbb-3(b)(1), unless the authorization is terminated or revoked.  Performed at [facility], [place] [address] [place], [place] [address]   Urine culture     Status: None   Collection Time: 10/09/20 11:53 AM   Specimen: In/Out Cath Urine  Result Value Ref Range Status   Specimen Description IN/OUT CATH URINE  Final   Special Requests NONE  Final   Culture   Final    NO GROWTH Performed at [facility], [place] [address] [place], [place] [address]    Report Status 10/10/2020 FINAL  Final  MRSA PCR Screening     Status: None   Collection Time: 10/10/20  1:14 PM   Specimen: Nasal Mucosa; Nasopharyngeal  Result Value Ref Range Status   MRSA by PCR NEGATIVE NEGATIVE Final    Comment:        The GeneXpert MRSA Assay (FDA approved for NASAL specimens only), is one component of a comprehensive MRSA colonization surveillance program. It is not intended to diagnose MRSA infection nor to guide or monitor treatment for MRSA infections. Performed at [facility], [place] [address], [place], [place] [address]   Gram stain     Status: None   Collection Time: 10/10/20  3:10 PM   Specimen: Abdomen; Peritoneal Fluid  Result Value Ref Range Status   Specimen Description FLUID ABDOMEN PERITONEAL  Final   Special Requests NONE  Final    Gram Stain   Final    WBC PRESENT, PREDOMINANTLY PMN NO ORGANISMS SEEN CYTOSPIN SMEAR Performed at [facility], [place] [address] [place], [place] [address]    Report Status 10/11/2020 FINAL  Final  Culture, body fluid w Gram Stain-bottle     Status: None (Preliminary result)   Collection Time: 10/10/20  3:10 PM   Specimen: Fluid  Result Value Ref Range Status   Specimen Description FLUID PERITONEAL ABDOMEN  Final   Special Requests NONE  Final   Culture   Final    NO GROWTH < 12 HOURS Performed at [facility], [place] [address] [place], [place] [address]    Report Status PENDING  Incomplete      Studies: IR Paracentesis  Result Date: 10/10/2020 INDICATION: History of multifocal hepatocellular cancer. Recurrent ascites. Request for diagnostic and therapeutic paracentesis. EXAM: ULTRASOUND GUIDED LEFT LOWER QUADRANT PARACENTESIS MEDICATIONS: 1% plain lidocaine, 5 mL COMPLICATIONS: None immediate. PROCEDURE: Informed written consent was obtained from the patient after a discussion of the risks, benefits and alternatives to treatment. A timeout was performed prior to the initiation of the procedure. Initial ultrasound scanning demonstrates a large amount of ascites within the left lower abdominal quadrant. The left lower abdomen was prepped and draped in the usual sterile fashion. 1% lidocaine was used for local anesthesia. Following this, a 19 gauge, 7-cm, Yueh catheter was introduced. An ultrasound image was saved for documentation purposes. The paracentesis was performed. The catheter was removed and a dressing was applied. The patient tolerated the procedure well without immediate post procedural complication. FINDINGS: A total of approximately  5.8 L of clear yellow fluid was removed. Samples were sent to the laboratory as requested by the clinical team. IMPRESSION: Successful ultrasound-guided paracentesis yielding 5.8 liters of peritoneal fluid. Read by: Ascencion Dike PA-C  Electronically Signed   By: Sandi Mariscal M.D.   On: 10/10/2020 15:40    Scheduled Meds: . (feeding supplement) PROSource Plus  30 mL Oral BID BM  . Chlorhexidine Gluconate Cloth  6 each Topical Q0600  . darbepoetin (ARANESP) injection - DIALYSIS  60 mcg Intravenous Q Fri-HD  . feeding supplement  1 Container Oral TID BM  . feeding supplement  237 mL Oral TID BM  . lactulose  20 g Oral TID  . midodrine  10 mg Oral TID  . multivitamin  1 tablet Oral QHS  . phytonadione  5 mg Oral Once  . rifaximin  550 mg Oral BID    Continuous Infusions: . sodium chloride    . sodium chloride    . albumin human 25 g (10/11/20 0903)  . ceFEPime (MAXIPIME) IV    . phytonadione (VITAMIN K) IV 5 mg (10/10/20 1547)     LOS: 2 days     Kayleen Memos, MD [facility] Pager [phone]  If 7PM-7AM, please contact night-coverage [email or web address] Password [email or web address] 10/11/2020, 1:20 PM

## 2020-10-11 NOTE — Evaluation (Signed)
Clinical/Bedside Swallow Evaluation Patient Details  Name: Dillon Perkins MRN: [id number] Date of Birth: 08/20/1950  Today's Date: 10/11/2020 Time: SLP Start Time (ACUTE ONLY): 1406 SLP Stop Time (ACUTE ONLY): 1445 SLP Time Calculation (min) (ACUTE ONLY): 39 min  Past Medical History:  Past Medical History:  Diagnosis Date  . Alcoholic cirrhosis ([place])   . Atrial fibrillation ([place])   . GERD (gastroesophageal reflux disease)   . Hepatocellular carcinoma ([place])   . History of chemotherapy taking immunotherapy   last dose 05-13-2020  . History of kidney stones   . Hyperlipidemia   . Hypertension    Past Surgical History:  Past Surgical History:  Procedure Laterality Date  . APPENDECTOMY  yrs ago  . CYSTOSCOPY/URETEROSCOPY/HOLMIUM LASER/STENT PLACEMENT Bilateral 05/22/2020   Procedure: CYSTOSCOPY/RETROGRADE/URETEROSCOPY/HOLMIUM LASER/STENT PLACEMENT STONE BASKET RETRIVAL ;  Surgeon: Ceasar Mons, MD;  Location: [facility], [place];  Service: Urology;  Laterality: Bilateral;  . IR ANGIOGRAM SELECTIVE EACH ADDITIONAL VESSEL  02/19/2020  . IR ANGIOGRAM SELECTIVE EACH ADDITIONAL VESSEL  02/19/2020  . IR ANGIOGRAM SELECTIVE EACH ADDITIONAL VESSEL  02/19/2020  . IR ANGIOGRAM SELECTIVE EACH ADDITIONAL VESSEL  02/19/2020  . IR ANGIOGRAM SELECTIVE EACH ADDITIONAL VESSEL  02/19/2020  . IR ANGIOGRAM SELECTIVE EACH ADDITIONAL VESSEL  02/19/2020  . IR ANGIOGRAM SELECTIVE EACH ADDITIONAL VESSEL  03/01/2020  . IR ANGIOGRAM SELECTIVE EACH ADDITIONAL VESSEL  03/01/2020  . IR ANGIOGRAM SELECTIVE EACH ADDITIONAL VESSEL  03/01/2020  . IR ANGIOGRAM VISCERAL SELECTIVE  02/19/2020  . IR ANGIOGRAM VISCERAL SELECTIVE  03/01/2020  . IR EMBO ARTERIAL NOT HEMORR HEMANG INC GUIDE ROADMAPPING  02/19/2020  . IR EMBO TUMOR ORGAN ISCHEMIA INFARCT INC GUIDE ROADMAPPING  02/19/2020  . IR EMBO TUMOR ORGAN ISCHEMIA INFARCT INC GUIDE ROADMAPPING  03/01/2020  . IR PARACENTESIS  07/15/2020  . IR PARACENTESIS   08/02/2020  . IR PARACENTESIS  08/19/2020  . IR PARACENTESIS  09/09/2020  . IR PARACENTESIS  10/07/2020  . IR PARACENTESIS  10/10/2020  . IR RADIOLOGIST EVAL & MGMT  01/30/2020  . IR US GUIDE VASC ACCESS RIGHT  02/19/2020  . IR US GUIDE VASC ACCESS RIGHT  03/01/2020   HPI:  Pt is a 70 yo male admitted with AMS, lethargy, and poor appetite. CTH negative for acute changes. Abdominal CT with large volume ascites, cirrhosis. Workup for acute hepatic encephalopathy, afib with RVR. Per oncology, pt has likely developed hepatorenal syndrome due to worsening HCC. S/p paracentesis from LLQ on 4/7. PMH includes hepatocellular carcinoma (s/p radioembolization; immunotherapy), alcoholic cirrhosis w/ portal HTN, PAF, CKD V (recently started HD TTS), GERD.   Assessment / Plan / Recommendation Clinical Impression  Pt presents has some generalized oral weakness as well as bilateral lesions on his tongue that appear purple in color (?bruising, although he denies pain or biting his tongue). He has recently been consuming a mostly pureed diet because of reports of impaired taste that also seems to coincide with immediate regurgitation of solids when he tries to swallow them. Today he was able to consume a few consistencies, including some solids, with mild improvement in taste per his report and no observable backflow. An immediate cough was noted after his first straw sip of thin liquids without any other s/s of potential aspiration. Based on current presentation he seems appropriate for diet advancement. Per his preference, we will upgrade slowly, starting with Dys 2 (finely chopped) solids and thin liquids. SLP will continue to follow for tolerance and potential to advance further. If regurgitation persists,  pt may also benefit from input from GI.  SLP Visit Diagnosis: Dysphagia, unspecified (R13.10)    Aspiration Risk  Mild aspiration risk    Diet Recommendation Dysphagia 2 (Fine chop);Thin liquid   Liquid  Administration via: Cup;Straw Medication Administration: Whole meds with liquid Supervision: Patient able to self feed;Intermittent supervision to cue for compensatory strategies Compensations: Slow rate;Small sips/bites Postural Changes: Seated upright at 90 degrees;Remain upright for at least 30 minutes after po intake    Other  Recommendations Oral Care Recommendations: Oral care BID   Follow up Recommendations  (tba)      Frequency and Duration min 2x/week  2 weeks       Prognosis Prognosis for Safe Diet Advancement: Good      Swallow Study   General HPI: Pt is a 70 yo male admitted with AMS, lethargy, and poor appetite. CTH negative for acute changes. Abdominal CT with large volume ascites, cirrhosis. Workup for acute hepatic encephalopathy, afib with RVR. Per oncology, pt has likely developed hepatorenal syndrome due to worsening HCC. S/p paracentesis from LLQ on 4/7. PMH includes hepatocellular carcinoma (s/p radioembolization; immunotherapy), alcoholic cirrhosis w/ portal HTN, PAF, CKD V (recently started HD TTS), GERD. Type of Study: Bedside Swallow Evaluation Previous Swallow Assessment: none in chart Diet Prior to this Study: Dysphagia 1 (puree);Thin liquids Temperature Spikes Noted: No Respiratory Status: Room air History of Recent Intubation: No Behavior/Cognition: Alert;Cooperative;Pleasant mood Oral Cavity Assessment: Other (comment) (discoloration of tongue - see clinical impressions) Oral Care Completed by SLP: No Oral Cavity - Dentition: Adequate natural dentition Vision: Functional for self-feeding Self-Feeding Abilities: Able to feed self Patient Positioning: Upright in bed Baseline Vocal Quality: Normal Volitional Cough: Strong Volitional Swallow: Able to elicit    Oral/Motor/Sensory Function Overall Oral Motor/Sensory Function: Generalized oral weakness   Ice Chips Ice chips: Not tested   Thin Liquid Thin Liquid: Impaired Presentation: Cup;Self  Fed;Straw Pharyngeal  Phase Impairments: Cough - Immediate    Nectar Thick Nectar Thick Liquid: Not tested   Honey Thick Honey Thick Liquid: Not tested   Puree Puree: Within functional limits Presentation: Self Fed;Spoon   Solid     Solid: Within functional limits Presentation: Self Fed       Osie Bond., M.A. [place] Pager [phone] Office [phone]  10/11/2020,4:21 PM

## 2020-10-11 NOTE — Progress Notes (Addendum)
[facility] Progress Note  CC:  Hepatic encephalopathy  Subjective: He is sleeping, his spouse and son are at the bed side. Family and the patient's RN stated he has been much more alert and awake today and maintaining appropriate conversations. He was arousable at this time. He ate a small portion of solid food today which his family stated was the most he's eaten in a few days. No N/V or abdominal pain.   Objective:  Vital signs in last 24 hours: Temp:  [97.4 F (36.3 C)-98 F (36.7 C)] 97.9 F (36.6 C) (04/08 1439) Pulse Rate:  [78-95] 95 (04/08 1439) Resp:  [11-20] 17 (04/08 1439) BP: (89-127)/(50-74) 91/64 (04/08 1439) SpO2:  [98 %-100 %] 100 % (04/08 1439) Weight:  [81.8 kg-82.6 kg] 81.8 kg (04/08 0950) Last BM Date: 10/11/20   General:   Sleeping but arousable. See note above.  Sclera: No scleral icterus.  Heart: RRR, no murmur.  Pulm:  Breath sounds clear, decreased in the bases.  Abdomen:  Soft, no gross distension, likely has some ascites, abdomen is not tense. + BS x 4 quads.  Extremities:  Without edema. Neurologic: Sleeping but arousable. Follows commands. Asterixis exam not done as patient too sleepy to perform. As noted above the patient's family and RN stated the patient has remained very alert today and he was tired after his dialysis session and wanted to sleep.    Intake/Output from previous day: 04/07 0701 - 04/08 0700 In: 380 [P.O.:380] Out: 722 [Urine:400; Stool:300] Intake/Output this shift: Total I/O In: -  Out: 1000 [Other:1000]   Flexiseal bag: 500cc liquid brown stool past 24 hours.   Lab Results: Recent Labs    10/10/20 0547 10/11/20 0554 10/11/20 1431  WBC 6.9 5.3 6.0  HGB 9.1* 7.5* 8.5*  HCT 26.6* 23.1* 26.1*  PLT 91* 63* 60*   BMET Recent Labs    10/09/20 2023 10/10/20 0547 10/11/20 0554  NA 133* 136 136  K 4.8 4.6 3.7  CL 100 104 100  CO2 21* 21* 21*  GLUCOSE 113* 107* 98  BUN 74* 78* 51*  CREATININE  3.21* 3.41* 2.95*  CALCIUM 8.7* 9.2 9.3   LFT Recent Labs    10/11/20 0554  PROT 6.6  ALBUMIN 3.6  AST 77*  ALT 37  ALKPHOS 57  BILITOT 6.8*   PT/INR Recent Labs    10/10/20 0547 10/11/20 0554  LABPROT 21.0* 22.3*  INR 1.9* 2.0*   Hepatitis Panel Recent Labs    10/11/20 1227  HEPBSAG NON REACTIVE    US LIVER DOPPLER  Result Date: 10/10/2020 CLINICAL DATA:  Cirrhosis of liver with ascites EXAM: DUPLEX ULTRASOUND OF LIVER TECHNIQUE: Color and duplex Doppler ultrasound was performed to evaluate the hepatic in-flow and out-flow vessels. COMPARISON:  CT abdomen/pelvis yesterday FINDINGS: Liver: Diffusely nodular liver contour. The liver appears small in the parenchyma is diffusely heterogeneous and echogenic. Hypoechoic soft tissue mass in the left upper lobe measures 4.8 x 4.4 x 5.0 cm. Hypoechoic solid mass in the right hepatic lobe measures approximately 4.9 x 5.7 x 8.3 cm. Diffusely thickened gallbladder wall. Sludge is present within the gallbladder lumen. The common bile duct is normal at 3 mm. Main Portal Vein size: 1.2 cm Portal Vein Velocities Main Prox:  41 cm/sec with hepatopetal directional flow. Main Mid: 55 cm/sec with hepatopetal directional flow. Main Dist:  71 cm/sec with hepatopetal directional flow. Right: Unable to visualize Left: 28 cm/sec with hepatopetal directional flow. Hepatic Vein Velocities  Right:  27 cm/sec Middle:  32 cm/sec Left:  68 cm/sec IVC: Present and patent with normal respiratory phasicity. Hepatic Artery Velocity:  109 cm/sec Splenic Vein Velocity:  34 cm/sec Spleen: 8.4 cm x 12.0 cm x 8.2 cm with a total volume of 436 cm^3 (411 cm^3 is upper limit normal) Portal Vein Occlusion/Thrombus: No Splenic Vein Occlusion/Thrombus: No Ascites: None Varices: Moderate ascites. IMPRESSION: 1. 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 2. Portal veins remain patent with hepatopetal directional flow. 3. Previously treated  left hepatic mass measures approximately 4.8 x 4.4 x 5.0 cm. Viability of the lesion cannot be assessed with ultrasound. 4. Right hepatic mass measuring approximately 4.9 x 5.7 x 8.3 cm not identified on prior imaging. Recommend further evaluation with gadolinium-enhanced MRI of the abdomen when the patient's clinical status is improved, preferably as an outpatient to facilitate optimal imaging quality and diagnostic sensitivity. Electronically Signed   By: Jacqulynn Cadet M.D.   On: 10/10/2020 07:45   IR Paracentesis  Result Date: 10/10/2020 INDICATION: History of multifocal hepatocellular cancer. Recurrent ascites. Request for diagnostic and therapeutic paracentesis. EXAM: ULTRASOUND GUIDED LEFT LOWER QUADRANT PARACENTESIS MEDICATIONS: 1% plain lidocaine, 5 mL COMPLICATIONS: None immediate. PROCEDURE: Informed written consent was obtained from the patient after a discussion of the risks, benefits and alternatives to treatment. A timeout was performed prior to the initiation of the procedure. Initial ultrasound scanning demonstrates a large amount of ascites within the left lower abdominal quadrant. The left lower abdomen was prepped and draped in the usual sterile fashion. 1% lidocaine was used for local anesthesia. Following this, a 19 gauge, 7-cm, Yueh catheter was introduced. An ultrasound image was saved for documentation purposes. The paracentesis was performed. The catheter was removed and a dressing was applied. The patient tolerated the procedure well without immediate post procedural complication. FINDINGS: A total of approximately 5.8 L of clear yellow fluid was removed. Samples were sent to the laboratory as requested by the clinical team. IMPRESSION: Successful ultrasound-guided paracentesis yielding 5.8 liters of peritoneal fluid. Read by: Ascencion Dike PA-C Electronically Signed   By: Sandi Mariscal M.D.   On: 10/10/2020 15:40    Assessment / Plan:  1.  Acute hepatic encephalopathy.Head CT  negative. Received Lactulose enemas. Ammonia level 189 -> 36.  -Change lactulose enemas bid to Lactulose 20gm po tid (previously ordered). -Continue Xifaxan 550mg  po bid  -Monitor neuro status closely  2.  Sepsiscriteria positive. Cefepime,Vancomycin initiated.  3.  Rapid atrial fibrillation. Cardizem drip initiated. Anticoagulation not initiated Elevated BNP 2740. Hemodynamically stable.   4.  Ascites requiring frequent paracentesis.  S/P paracentesis 4/7, 5.8L of peritoneal fluid removed. No SBP.  Albumin 25gm IV Q 6 hours.   5.  EtOH cirrhosis Meld-Na31. Discriminant function score 34. Patient has been abstinent of alcohol since diagnosis with cirrhosis, [place] in July 2021  6. Hepatocellular carcinoma treated 02/2020 withY 92and nivolumab. Noncontrast CTAP 10/09/2020: Large amount of ascites in the abdomen, pelvis.  Cirrhosis.  Multifocal hepatocellular carcinoma.  Dominant lesion in segment 2 similar to imaging in 06/2020.  Unable to assess other lesions due to lack of IV contrast.  Esophageal and upper abdominal varices.  Nonspecific mild, diffuse colon wall thickening.  GB sludge, tiny stones.  Trace bilateral pleural effusions. Followed by Dr.Sherrill. -Liver MRI previously ordered. Most likely will be done Sat 4/9 at 10 am then dialysis right after per nephrology   7. Esophageal, upper abdominal varices. 03/2020 EGD: Grade 2 esophageal varices in  mid to distal esophagus.  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  Severe portal hypertensive gastropathy without gastric varices.  Small HH.   8. ESRD. HD initiated on 4/6.  9. Hyponatremia, resolved   10. Macrocytic anemia. Hb 9.1 -> 8.5.   11. Thrombocytopenia. Platelets 91 -> 60.   12. Coagulopathy. INR 1.7 >> 5 mg po Vit K >> 1.9 >> 2.0. Vitamin K 5mg  IV on 4/7 and 4/8. -PT/INR in am   Further recommendations per Dr. Silverio Decamp     Active  Problems:   Atrial fibrillation with RVR ([employer])   Sepsis ([place])   Delirium   Malnutrition of moderate degree     LOS: 2 days   Noralyn Pick  10/11/2020, 4:04 PM    Attending physician's note   I have taken an interval history, reviewed the chart and examined the patient. I agree with the Advanced Practitioner's note, impression and recommendations.   EtOH cirrhosis decompensated with ascites and encephalopathy Meld Na 31 Continue Xifaxan and lactulose ESRD on hemodialysis On broad-spectrum antibiotics Hepatocellular carcinoma s/p Y 90 and Nivolumab in 2021, recurrent multifocal hepatocellular carcinoma on CT October 09, 2020.  MRI liver protocol pending  Please consult palliative care to discuss goals of care, his overall prognosis is poor  GI will continue to follow along  I have spent 35 minutes of patient care (this includes precharting, chart review, review of results, face-to-face time used for counseling as well as treatment plan and follow-up. The patient was provided an opportunity to ask questions and all were answered. The patient agreed with the plan and demonstrated an understanding of the instructions.  Damaris Hippo , MD [phone]

## 2020-10-11 NOTE — Progress Notes (Addendum)
HEMATOLOGY-ONCOLOGY PROGRESS NOTE  SUBJECTIVE: The patient was seen today during dialysis.  He is much more alert and talkative.  He has no specific complaints today.  Oncology History  Hepatocellular carcinoma ([place])  03/26/2020 Initial Diagnosis   Hepatocellular carcinoma ([place])   04/08/2020 -  Chemotherapy      Patient is on Antibody Plan: COLORECTAL NIVOLUMAB Q14D     PHYSICAL EXAMINATION:  Vitals:   10/11/20 1030 10/11/20 1100  BP: (!) 90/50 99/60  Pulse: 86 79  Resp:    Temp:    SpO2:     Filed Weights   10/10/20 0910 10/11/20 0431 10/11/20 0950  Weight: 86.8 kg 82.6 kg 81.8 kg    Intake/Output from previous day: 04/07 0701 - 04/08 0700 In: 380 [P.O.:380] Out: 722 [Urine:400; Stool:300]  GENERAL: Chronically ill-appearing male, no distress EYES: Jaundice present LUNGS: clear to auscultation and percussion with normal breathing effort HEART: regular rate & rhythm and no murmurs and no lower extremity edema ABDOMEN: Positive bowel sounds, distended NEURO: Alert and oriented x3  LABORATORY DATA:  I have reviewed the data as listed CMP Latest Ref Rng & Units 10/11/2020 10/10/2020 10/09/2020  Glucose 70 - 99 mg/dL 98 107(H) 113(H)  BUN 8 - 23 mg/dL 51(H) 78(H) 74(H)  Creatinine 0.61 - 1.24 mg/dL 2.95(H) 3.41(H) 3.21(H)  Sodium 135 - 145 mmol/L 136 136 133(L)  Potassium 3.5 - 5.1 mmol/L 3.7 4.6 4.8  Chloride 98 - 111 mmol/L 100 104 100  CO2 22 - 32 mmol/L 21(L) 21(L) 21(L)  Calcium 8.9 - 10.3 mg/dL 9.3 9.2 8.7(L)  Total Protein 6.5 - 8.1 g/dL 6.6 6.7 -  Total Bilirubin 0.3 - 1.2 mg/dL 6.8(H) 6.4(H) -  Alkaline Phos 38 - 126 U/L 57 85 -  AST 15 - 41 U/L 77(H) 85(H) -  ALT 0 - 44 U/L 37 44 -    Lab Results  Component Value Date   WBC 5.3 10/11/2020   HGB 7.5 (L) 10/11/2020   HCT 23.1 (L) 10/11/2020   MCV 108.5 (H) 10/11/2020   PLT 63 (L) 10/11/2020   NEUTROABS 4.1 10/11/2020    CT Abdomen Pelvis Wo Contrast  Result Date: 10/09/2020 CLINICAL DATA:  Abdominal  distension. 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 COMPARISON:  MRI from 07/04/2020 FINDINGS: Lower chest: Trace bilateral pleural effusions identified. Hepatobiliary: The liver appears cirrhotic. Treated tumor within segment 2 is measures 5.3 x 4.3 cm on today's exam, image 17/3. This is compared with 5.0 x 4.6 cm previously. The other known liver lesions are difficult to assess reflecting lack of IV contrast material. Sludge and tiny stones identified within the dependent portion of the gallbladder. Pancreas: Unremarkable. No pancreatic ductal dilatation or surrounding inflammatory changes. Spleen: Normal in size without focal abnormality. Adrenals/Urinary Tract: Normal appearance of the adrenal glands. Small bilateral renal calculi identified. No hydronephrosis. Urinary bladder is unremarkable. Stomach/Bowel: The stomach appears nondistended. Small bowel loops are unremarkable. Mild diffuse colonic wall thickening is nonspecific in the setting of ascites and portal venous hypertension. Vascular/Lymphatic: Aortic atherosclerosis. No aneurysm. Previous GDA embolization. Esophageal and upper abdominal varices. No abdominopelvic adenopathy. Reproductive: Prostate is unremarkable. Other: Large volume of ascites within the abdomen and pelvis. A left inguinal hernia is identified which is filled with ascites as well as a nonobstructed loop of sigmoid colon. No discrete fluid collections  identified. Musculoskeletal: Bilateral L5 pars defects identified. Mild anterolisthesis of L5 on S1 noted. IMPRESSION: 1. Large volume of ascites identified within the abdomen and pelvis. 2. Cirrhosis with known multifocal hepatocellular carcinoma. Status post Y 90 microsphere ablation. Dominant lesion in segment 2 appears similar  to 07/04/2020 3.  Aortic Atherosclerosis (ICD10-I70.0). 4. Gallstones. 5. Trace pleural effusions. Electronically Signed   By: Kerby Moors M.D.   On: 10/09/2020 13:49   CT Head Wo Contrast  Result Date: 10/09/2020 CLINICAL DATA:  Neurological deficit EXAM: CT HEAD WITHOUT CONTRAST TECHNIQUE: Contiguous axial images were obtained from the base of the skull through the vertex without intravenous contrast. COMPARISON:  Prior head CT 05/29/2020 FINDINGS: Brain: No evidence of acute infarction, hemorrhage, hydrocephalus, extra-axial collection or mass lesion/mass effect. Stable scattered foci of white matter hypoattenuation consistent with chronic microvascular ischemic white matter disease. Tiny bilateral cerebellar hemisphere lacunar infarcts are also unchanged. Mild cortical atrophy without interval progression. Vascular: No hyperdense vessel or unexpected calcification. Atherosclerotic vascular calcifications in the bilateral cavernous and supraclinoid carotid arteries. Skull: Normal. Negative for fracture or focal lesion. Sinuses/Orbits: No acute finding. Other: None. IMPRESSION: 1. No acute intracranial abnormality. 2. Stable cortical atrophy, chronic microvascular ischemic white matter disease and bilateral cerebellar hemisphere lacunar infarcts. Electronically Signed   By: Jacqulynn Cadet M.D.   On: 10/09/2020 11:00   DG Chest Port 1 View  Result Date: 10/09/2020 CLINICAL DATA:  Altered mental status. History of hepatocellular carcinoma EXAM: PORTABLE CHEST 1 VIEW COMPARISON:  May 22, 2020. FINDINGS: Note that a small amount of the lateral left base not included. Central catheter tip is in the right atrium. No pneumothorax. There is no edema or airspace opacity. Heart is mildly enlarged with pulmonary vascularity normal. No adenopathy. There is aortic atherosclerosis. No bone lesions. A focus of calcification is noted in the left carotid artery. IMPRESSION: Note that a small portion of the lateral  left base not included. Visualized lungs clear. Stable cardiac prominence. Central catheter tip in right atrium without pneumothorax. Aortic Atherosclerosis (ICD10-I70.0). Focus of calcification noted in left carotid artery. Electronically Signed   By: Lowella Grip III M.D.   On: 10/09/2020 09:36   US LIVER DOPPLER  Result Date: 10/10/2020 CLINICAL DATA:  Cirrhosis of liver with ascites EXAM: DUPLEX ULTRASOUND OF LIVER TECHNIQUE: Color and duplex Doppler ultrasound was performed to evaluate the hepatic in-flow and out-flow vessels. COMPARISON:  CT abdomen/pelvis yesterday FINDINGS: Liver: Diffusely nodular liver contour. The liver appears small in the parenchyma is diffusely heterogeneous and echogenic. Hypoechoic soft tissue mass in the left upper lobe measures 4.8 x 4.4 x 5.0 cm. Hypoechoic solid mass in the right hepatic lobe measures approximately 4.9 x 5.7 x 8.3 cm. Diffusely thickened gallbladder wall. Sludge is present within the gallbladder lumen. The common bile duct is normal at 3 mm. Main Portal Vein size: 1.2 cm Portal Vein Velocities Main Prox:  41 cm/sec with hepatopetal directional flow. Main Mid: 55 cm/sec with hepatopetal directional flow. Main Dist:  71 cm/sec with hepatopetal directional flow. Right: Unable to visualize Left: 28 cm/sec with hepatopetal directional flow. Hepatic Vein Velocities Right:  27 cm/sec Middle:  32 cm/sec Left:  68 cm/sec IVC: Present and patent with normal respiratory phasicity. Hepatic Artery Velocity:  109 cm/sec Splenic Vein Velocity:  34 cm/sec Spleen: 8.4 cm x 12.0 cm x 8.2 cm with a total volume of 436 cm^3 (411 cm^3 is upper limit normal) Portal Vein Occlusion/Thrombus: No Splenic Vein Occlusion/Thrombus: No Ascites: None  Varices: Moderate ascites. IMPRESSION: 1. 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 2. Portal veins remain patent with hepatopetal directional flow. 3. Previously treated left hepatic mass measures  approximately 4.8 x 4.4 x 5.0 cm. Viability of the lesion cannot be assessed with ultrasound. 4. Right hepatic mass measuring approximately 4.9 x 5.7 x 8.3 cm not identified on prior imaging. Recommend further evaluation with gadolinium-enhanced MRI of the abdomen when the patient's clinical status is improved, preferably as an outpatient to facilitate optimal imaging quality and diagnostic sensitivity. Electronically Signed   By: Jacqulynn Cadet M.D.   On: 10/10/2020 07:45   IR Paracentesis  Result Date: 10/10/2020 INDICATION: History of multifocal hepatocellular cancer. Recurrent ascites. Request for diagnostic and therapeutic paracentesis. EXAM: ULTRASOUND GUIDED LEFT LOWER QUADRANT PARACENTESIS MEDICATIONS: 1% plain lidocaine, 5 mL COMPLICATIONS: None immediate. PROCEDURE: Informed written consent was obtained from the patient after a discussion of the risks, benefits and alternatives to treatment. A timeout was performed prior to the initiation of the procedure. Initial ultrasound scanning demonstrates a large amount of ascites within the left lower abdominal quadrant. The left lower abdomen was prepped and draped in the usual sterile fashion. 1% lidocaine was used for local anesthesia. Following this, a 19 gauge, 7-cm, Yueh catheter was introduced. An ultrasound image was saved for documentation purposes. The paracentesis was performed. The catheter was removed and a dressing was applied. The patient tolerated the procedure well without immediate post procedural complication. FINDINGS: A total of approximately 5.8 L of clear yellow fluid was removed. Samples were sent to the laboratory as requested by the clinical team. IMPRESSION: Successful ultrasound-guided paracentesis yielding 5.8 liters of peritoneal fluid. Read by: Ascencion Dike PA-C Electronically Signed   By: Sandi Mariscal M.D.   On: 10/10/2020 15:40   IR Paracentesis  Result Date: 10/07/2020 INDICATION: Patient with history of cirrhosis,  recurrent ascites. Request is made for therapeutic paracentesis of up to 3 L maximum. EXAM: ULTRASOUND GUIDED THERAPEUTIC PARACENTESIS MEDICATIONS: 10 mL 1% lidocaine COMPLICATIONS: None immediate. PROCEDURE: Informed written consent was obtained from the patient after a discussion of the risks, benefits and alternatives to treatment. A timeout was performed prior to the initiation of the procedure. Initial ultrasound scanning demonstrates a large amount of ascites within the right lower abdominal quadrant. The right lower abdomen was prepped and draped in the usual sterile fashion. 1% lidocaine was used for local anesthesia. Following this, a 19 gauge, 7-cm, Yueh catheter was introduced. An ultrasound image was saved for documentation purposes. The paracentesis was performed. The catheter was removed and a dressing was applied. The patient tolerated the procedure well without immediate post procedural complication. FINDINGS: A total of approximately 3.0 liters of yellow fluid was removed. IMPRESSION: Successful ultrasound-guided therapeutic paracentesis yielding 3.0 liters of peritoneal fluid. Read by: Brynda Greathouse PA-C Electronically Signed   By: Aletta Edouard M.D.   On: 10/07/2020 13:19    ASSESSMENT AND PLAN: 1. Hepatocellular carcinoma ? Multiple Li-Rad 5 liver lesions noted on a CT abdomen/pelvis 01/02/2020 including a 7.1 x 5.7 cm segment 2 lesion, there are right and left hepatic lesions ? CT abdomen/pelvis 01/02/2020-cirrhosis with varices, multiple LR 5 liver lesions, 1.1 cm fluid density lesion in the pancreas head, splenomegaly, bilateral kidney stones, small amount of right perihepatic ascites ? MRI liver 02/08/2020-cirrhosis, numerous hyperenhancing liver lesions, largest lesion in segment two measuring 7.9 x 6.1 cm, lesions are consistent with multifocal hepatocellular carcinoma, the three largest lesions are categorized as LI-RADS5. Fluid  signal cystic lesions in the pancreas consistent with  small sidebranch IPMN's, gastroesophageal varices, trace perihepatic ascites ? Y90 left hepatic lobe 03/01/2020 ? Cycle 1 nivolumab 04/08/2020 ? Cycle 2 nivolumab 04/22/2020 ? Cycle 3 nivolumab 05/13/2020 ? Cycle 4 nivolumab 05/27/2020 ? Cycle 5 nivolumab 06/11/2020 ? MRI liver 07/04/2020-cirrhosis with portal venous hypertension and ascites. Decrease in size and enhancement associated with treated left liver lesions, segment 6 lesion mildly increased in size, decreased size of segment 5 lesion ? Cycle 6 nivolumab 07/09/2020 ? Tumor board 07/17/2020-not a candidate for further Y90, continue nivolumab ? Cycle 7 nivolumab 07/23/2020 ? Cycle 8 nivolumab 08/06/2020 ? Cycle 9 nivolumab 08/20/2020 ? Cycle 10 nivolumab 09/03/2020 2. 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 Hypertension 4. BPH 5. Hyperlipidemia 6. History of heavy alcohol use 7. Remote history of tobacco use 8. Renal insufficiency-progressive, hemodialysis started 10/08/2020 9. Microscopic hematuria 10. 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 Esophageal mucosal changes consistent with long segment Barrett's esophagus present in the distal esophagus. Severe portal hypertensive gastropathy found in the entire examined stomach. Small hiatal hernia. 11. Mild right hydronephrosis with a probable calculus in the proximal third of the right ureter on the MRI 03/06/2020, renal ultrasound 04/01/2020-multiple nonobstructing bilateral renal calculi  05/22/2020-laser lithotripsy, bilateral ureter stent placement 12. Atrial fibrillation-not on anticoagulation therapy 13. Dizziness-improved with discontinuation of Flomax. Not orthostatic 06/11/2020 14. Skin rash with pruritus 08/06/2020-likely a nivolumab rash, trial of hydrocortisone cream 15.  Admission 10/09/2020 with hepatic  encephalopathy 16.  Anemia secondary to chronic disease, renal failure, and phlebotomy  Dillon Perkins appears improved.  He has significant improvement in his mental status.  His altered mental status was likely due to hepatic encephalopathy.  The patient has likely developed hepatorenal syndrome due to worsening HCC.  We will plan to get an MRI of the liver with and without contrast to compare to prior MRI to determine if there is truly disease progression.  Discussed with nephrology the patient will need to have MRI with contrast and then go immediately to dialysis thereafter.  Next dialysis scheduled for tomorrow morning.  I have entered an MRI of liver with and without contrast to be performed tomorrow morning around 10 AM.  He will go to dialysis thereafter.  The patient had a paracentesis performed on 4/7.  Fluid sent for cytology.  Results pending.  Recommendations: 1.  MRI of the liver with and without contrast on 4/9. 2.  Dialysis to be performed right after MRI. 3.  Will follow up on cytology from paracentesis. 4.  Please call oncology over the weekend as needed 5.  Erythropoietin, transfusion as needed per nephrology   LOS: 2 days   Mikey Bussing, DNP, AGPCNP-BC, AOCNP 10/11/20   [name] was interviewed and examined.  He is more alert and less confused today.  He will continue hemodialysis and treatment of the hepatic encephalopathy.  We will schedule a restaging MRI of the liver.  I will follow up on the liver MRI result and check on him  if he remains in the hospital.  Please call oncology over the weekend as needed.  I was present for greater than 50% of today's visit.  I performed medical decision making.  Julieanne Manson, MD

## 2020-10-11 NOTE — Progress Notes (Signed)
PT Cancellation Note  Patient Details Name: Dillon Perkins MRN: [id number] DOB: Mar 30, 1951   Cancelled Treatment:    Reason Eval/Treat Not Completed: Patient at procedure or test/unavailable (HD). Will follow-up for PT Evaluation as schedule permits.  Mabeline Caras, PT, DPT Acute Rehabilitation Services  Pager [phone] Office [place] 10/11/2020, 9:43 AM

## 2020-10-12 ENCOUNTER — Inpatient Hospital Stay (HOSPITAL_COMMUNITY): Payer: Medicare Other

## 2020-10-12 DIAGNOSIS — K7031 Alcoholic cirrhosis of liver with ascites: Secondary | ICD-10-CM | POA: Diagnosis not present

## 2020-10-12 DIAGNOSIS — Z66 Do not resuscitate: Secondary | ICD-10-CM | POA: Diagnosis not present

## 2020-10-12 DIAGNOSIS — G9341 Metabolic encephalopathy: Secondary | ICD-10-CM | POA: Diagnosis not present

## 2020-10-12 DIAGNOSIS — K7290 Hepatic failure, unspecified without coma: Secondary | ICD-10-CM | POA: Diagnosis not present

## 2020-10-12 DIAGNOSIS — C220 Liver cell carcinoma: Secondary | ICD-10-CM | POA: Diagnosis not present

## 2020-10-12 DIAGNOSIS — Z515 Encounter for palliative care: Secondary | ICD-10-CM | POA: Diagnosis not present

## 2020-10-12 DIAGNOSIS — Z7189 Other specified counseling: Secondary | ICD-10-CM | POA: Diagnosis not present

## 2020-10-12 LAB — COMPREHENSIVE METABOLIC PANEL
ALT: 36 U/L (ref 0–44)
AST: 74 U/L — ABNORMAL HIGH (ref 15–41)
Albumin: 3.7 g/dL (ref 3.5–5.0)
Alkaline Phosphatase: 66 U/L (ref 38–126)
Anion gap: 10 (ref 5–15)
BUN: 31 mg/dL — ABNORMAL HIGH (ref 8–23)
CO2: 25 mmol/L (ref 22–32)
Calcium: 9 mg/dL (ref 8.9–10.3)
Chloride: 98 mmol/L (ref 98–111)
Creatinine, Ser: 2.33 mg/dL — ABNORMAL HIGH (ref 0.61–1.24)
GFR, Estimated: 29 mL/min — ABNORMAL LOW (ref >60)
Glucose, Bld: 121 mg/dL — ABNORMAL HIGH (ref 70–99)
Potassium: 3.4 mmol/L — ABNORMAL LOW (ref 3.5–5.1)
Sodium: 133 mmol/L — ABNORMAL LOW (ref 135–145)
Total Bilirubin: 5.3 mg/dL — ABNORMAL HIGH (ref 0.3–1.2)
Total Protein: 6.7 g/dL (ref 6.5–8.1)

## 2020-10-12 LAB — CBC
HCT: 24.3 % — ABNORMAL LOW (ref 39.0–52.0)
Hemoglobin: 8.2 g/dL — ABNORMAL LOW (ref 13.0–17.0)
MCH: 36.6 pg — ABNORMAL HIGH (ref 26.0–34.0)
MCHC: 33.7 g/dL (ref 30.0–36.0)
MCV: 108.5 fL — ABNORMAL HIGH (ref 80.0–100.0)
Platelets: 65 10*3/uL — ABNORMAL LOW (ref 150–400)
RBC: 2.24 MIL/uL — ABNORMAL LOW (ref 4.22–5.81)
RDW: 17.2 % — ABNORMAL HIGH (ref 11.5–15.5)
WBC: 5.9 10*3/uL (ref 4.0–10.5)
nRBC: 1 % — ABNORMAL HIGH (ref 0.0–0.2)

## 2020-10-12 LAB — CULTURE, BLOOD (ROUTINE X 2)

## 2020-10-12 LAB — PROTIME-INR
INR: 1.9 — ABNORMAL HIGH (ref 0.8–1.2)
Prothrombin Time: 21.4 seconds — ABNORMAL HIGH (ref 11.4–15.2)

## 2020-10-12 MED ORDER — HEPARIN SODIUM (PORCINE) 1000 UNIT/ML IJ SOLN
INTRAMUSCULAR | Status: AC
  Filled 2020-10-12: qty 3

## 2020-10-12 MED ORDER — NEPRO/CARBSTEADY PO LIQD: 237.0000 mL | Freq: Two times a day (BID) | ORAL | Status: DC

## 2020-10-12 MED ORDER — AMIODARONE HCL IN DEXTROSE 360-4.14 MG/200ML-% IV SOLN
30.0000 mg/h | INTRAVENOUS | Status: DC
  Administered 2020-10-13: 30 mg/h via INTRAVENOUS
  Filled 2020-10-12 (×3): qty 200

## 2020-10-12 MED ORDER — DARBEPOETIN ALFA 60 MCG/0.3ML IJ SOSY
PREFILLED_SYRINGE | INTRAMUSCULAR | Status: AC
  Administered 2020-10-12: 60 ug via INTRAVENOUS
  Filled 2020-10-12: qty 0.3

## 2020-10-12 MED ORDER — AMIODARONE LOAD VIA INFUSION
150.0000 mg | Freq: Once | INTRAVENOUS | Status: AC
  Administered 2020-10-12: 150 mg via INTRAVENOUS
  Filled 2020-10-12: qty 83.34

## 2020-10-12 MED ORDER — GADOBUTROL 1 MMOL/ML IV SOLN
8.0000 mL | Freq: Once | INTRAVENOUS | Status: AC | PRN
  Administered 2020-10-12: 8 mL via INTRAVENOUS

## 2020-10-12 MED ORDER — POTASSIUM CHLORIDE CRYS ER 20 MEQ PO TBCR
40.0000 meq | EXTENDED_RELEASE_TABLET | Freq: Once | ORAL | Status: AC
  Administered 2020-10-12: 40 meq via ORAL
  Filled 2020-10-12: qty 2

## 2020-10-12 MED ORDER — MIDODRINE HCL 5 MG PO TABS
ORAL_TABLET | ORAL | Status: AC
  Administered 2020-10-12: 10 mg via ORAL
  Filled 2020-10-12: qty 2

## 2020-10-12 MED ORDER — AMIODARONE HCL IN DEXTROSE 360-4.14 MG/200ML-% IV SOLN
60.0000 mg/h | INTRAVENOUS | Status: DC
  Administered 2020-10-12 – 2020-10-13 (×2): 60 mg/h via INTRAVENOUS

## 2020-10-12 NOTE — Progress Notes (Signed)
Palliative Medicine Inpatient Follow Up Note  Reason for consult:  Discuss goals of care  HPI:  Per intake H&P --> Dillon Perkins a 70 y.o.malewith medical history significant ofhepatocellular carcinoma status post radio embolization, immunotherapywith Nivolumabx10 cycles last cycle was September 03, 812, alcoholic cirrhosis with portal hypertension, PAF noncompliant with Cardizem, CKD stage V starting dialysis, presented with altered mentation.  Palliative care was asked to get involved in the setting of suspected advancing multifocal hepatocellular carcinoma to further address goals of care.  Today's Discussion (10/12/2020):  *Please note that this is a verbal dictation therefore any spelling or grammatical errors are due to the "[place] [employer]" system interpretation.  I met with Dillon Perkins this morning. He was being shaved on the side of the bed by his wife, Dillon Perkins. We reviewed his present clinical situation. His wife shares with me that irregardless she is looking into getting a second opinion of his cancer and potential for a liver transplant at [facility] this upcoming month. We reviewed how she and her husband would appreciate an update from both oncology and hepatology. Dillon Perkins shares that Dr. Nevada Crane is already aware and coordinating this.  I met with Dillon Perkins outside [place] Dillon Perkins's room. We reviewed Palliative care and in home Palliative services. She requests having a conversation with the OP Palliative provider - I shared with her that I will reach out to [facility] to coordinate this. Reviewed the differences between Palliative care and in home hospice. Being that Dillon Perkins is in the information gathering phases and would like to continue to gain insights from other specialists we reviewed that OP Palliative care at this time would be the best option.   Offered time for Dillon Perkins to express her feelings in the setting of Dillon Perkins's present health ailments. Reviewed how overwhelming all of this is.    Questions and concerns addressed   Objective Assessment: Vital Signs Vitals:   10/12/20 0432 10/12/20 0829  BP: 118/89   Pulse: 100 (!) 106  Resp: 15 16  Temp: 97.7 F (36.5 C) 97.6 F (36.4 C)  SpO2: 99% 96%    Intake/Output Summary (Last 24 hours) at 10/12/2020 1211 Last data filed at 10/11/2020 2000 Gross per 24 hour  Intake 618 ml  Output 1500 ml  Net -882 ml   Last Weight  Most recent update: 10/12/2020  4:37 AM   Weight  80.7 kg (178 lb)           Gen:  Elderly M  HEENT: moist mucous membranes CV: Regular rate and rhythm  PULM: clear to auscultation bilaterally  ABD: Distended EXT: No edema  Neuro: Alert and oriented x3   SUMMARY OF RECOMMENDATIONS DNAR/DNI  MOST Completed, paper copy placed onto the chart electric copy can be found in [place]  DNR Form Completed, paper copy placed onto the chart electric copy can be found in [facility]  TOC - OP Palliative care consult   Appreciate MSW aiding in HD transportation options  Ongoing conversations pending MRI results, very likely possibility of Hospice recommendation which patient is prepared for  [place] and Oncology providing direct updates to patients wife, Dillon Perkins  Time Spent: 35 Greater than 50% of the time was spent in counseling and coordination of care ______________________________________________________________________________________ [place] Team Team Cell Phone: [phone] Please utilize secure chat with additional questions, if there is no response within 30 minutes please call the above phone number  Palliative Medicine Team providers are available by phone from 7am to  7pm daily and can be reached through the team cell phone.  Should this patient require assistance outside of these hours, please call the patient's attending physician.

## 2020-10-12 NOTE — Progress Notes (Addendum)
PROGRESS NOTE  EH SESAY [name]:[id number] DOB: 1951-01-31 DOA: 10/09/2020 PCP: Antony Contras, MD  HPI/Recap of past 24 hours: Dillon Perkins is a 70 y.o. 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  Due to concern for active infective process he was started on IV antibiotics empirically cefepime and IV vancomycin.  In the ED he was in A. fib RVR and was started on Cardizem drip.  CT head was negative for any acute intracranial findings.  Patient was started on lactulose enema with improvement of his ammonia level and of his mentation.  Status post paracentesis with 5.8L of fluid removed, peritoneal fluid sent for analysis.  He also received IV albumin for intravascular volume repletion, and vitamin K 5 mg daily x3 doses due to coagulopathy with INR of 1.9.  10/12/20: Patient was seen and examined with this wife at his bedside.  He denies having any abdominal pain.  He has no new complaints.  Plan for MRI of his liver with and without contrast and immediate hemodialysis thereafter.    Assessment/Plan: Active Problems:   Atrial fibrillation with RVR (HCC)   Sepsis (HCC)   Delirium   Malnutrition of moderate degree   Acute metabolic encephalopathy   Alcoholic cirrhosis of liver with ascites (HCC)  Resolved acute hepatic encephalopathy likely secondary to medication noncompliance with lactulose. Presented with confusion and elevated ammonia level, 190. Had stopped taking his lactulose and took Imodium instead to slow down his bowel movements. Restarted lactulose on presentation with improvement of ammonia level and symptomatology. Was also started on IV antibiotics empirically, IV vancomycin and  cefepime. Mentation is currently back to baseline. Continue to follow peritoneal fluid culture. Continue IV albumin for intravascular volume depletion to maintain MAP greater than 65. Continue lactulose with goal of 2-3 bowel movements per day  New ESRD on HD TTS Hepatorenal? Management per nephrology  Decompensated alcoholic cirrhosis with [place] Meld score of 34 on 10/12/2020. Patient's wife is interested in following up with hepatology and considering the possibility of liver transplant. Continue lactulose and rifaximin. Continue to follow peritoneal fluid cultures. Continue sodium restricted diet. Goal 2-3 soft stools per day while on lactulose, titrate to achieve that goal. MRI liver with and without contrast done on 10/12/2020 to reevaluate [facility] as recommended by medical oncology, follow results. Appreciate GI and medical oncology's assistance.  Coagulopathy in the setting of [place] Has received IV vitamin K INR 1.9 from 2.0 from 1.7. Repeat INR in the morning  Gram-positive cocci in cluster positive blood culture 1 out of 2 bottles Blood culture drawn on 10/09/2020 + for gram positive cocci in cluster 1 out of 2 bottles, aerobic bottle only.  Possibly contaminant. Continue to closely monitor IV vancomycin was restarted on 10/10/2020.  History of staph hominis bacterial peritonitis on 08/02/20 With resistance to oxacillin Continue to follow peritoneal and blood cultures.  Resolved paroxysmal A. fib with RVR Initially on Cardizem drip, stopped on 10/10/2020 Unable to start Lopressor 12.5 mg twice daily due to hypotension. Continue to monitor on telemetry.  Improving hyperbilirubinemia with jaundice T bili is downtrending 5.3 from 6.8 from 6.4. AST to ALT 2:1 ratio Continue to monitor  Resolved hyperammonemia in the setting of decompensated alcoholic cirrhosis Presented with ammonia level 190 Goal 2-3  soft stools per day while on lactulose, titrate to achieve that goal. Ammonia level  36 on 10/11/2020. Continue lactulose and rifaximin  Resolved mild anion gap metabolic acidosis Serum bicarb 25 from 21 Electrolytes addressed with hemodialysis Appreciate nephrology's assistance.  Hyponatremia, likely dilutional Serum sodium 133 Addressed with hemodialysis Repeat renal panel in the morning.  Chronic hypotension on midodrine Continue midodrine Continue IV albumin for now. Maintain MAP greater than 65.  Physical debility PT assessment recommended home health PT Continue fall precautions  Goals of care Palliative care team consult to assist with establishing goals of care. Currently the patient is DNR.  Code Status: DNR.  Family Communication: Updated his wife at bedside on 10/12/2020.  Disposition Plan: Likely will discharge to home with home health services once GI and medical oncology sign off.   Consultants:  GI.  Nephrology.  IR for diagnostic paracentesis  Palliative care team.  Procedures:  HD on 10/10/2020, on 10/11/2020, on 10/12/2020.  Antimicrobials:  IV vancomycin  IV cefepime.  DVT prophylaxis: SCDs.  Status is: Inpatient   Dispo:  Patient From: Home  Planned Disposition: Home with Health Care [facility] when hemodynamically stable and GI medical oncology have signed off.  Medically stable for discharge: No, ongoing management of decompensated cirrhosis.          Objective: Vitals:   10/12/20 0829 10/12/20 1500 10/12/20 1504 10/12/20 1530  BP:  100/66 105/67 100/61  Pulse: (!) 106 99 90 95  Resp: 16 16    Temp: 97.6 F (36.4 C) 98.5 F (36.9 C)    TempSrc: Oral Oral    SpO2: 96% 97%    Weight:  81.5 kg    Height:        Intake/Output Summary (Last 24 hours) at 10/12/2020 1634 Last data filed at 10/11/2020 2000 Gross per 24 hour  Intake 478 ml  Output 500 ml  Net -22 ml   Filed Weights   10/11/20 1312 10/12/20 0432 10/12/20 1500  Weight: 80.8 kg 80.7 kg 81.5 kg    Exam:  . General: 70 y.o. year-old male  chronically ill-appearing in no acute distress.  He is alert and oriented x3.  Mild icteric sclera, improved from prior exam. . Cardiovascular: Irregular rate and rhythm no rubs or gallops. Marland Kitchen Respiratory: Clear to auscultation no wheezes or rales.  Positive effort.   . Abdomen: Soft mildly distended.  Bowel sounds present.  . Musculoskeletal: No lower extremity edema bilaterally. . Skin: No ulcerative lesions noted.  Jaundice noted.   Marland Kitchen Psychiatry: Mood is appropriate for condition setting.   Data Reviewed: CBC: Recent Labs  Lab 10/09/20 1055 10/09/20 1412 10/09/20 2023 10/10/20 0547 10/11/20 0554 10/11/20 1431 10/12/20 0222  WBC 7.2  --  7.1 6.9 5.3 6.0 5.9  NEUTROABS 6.2  --   --  5.5 4.1  --   --   HGB 10.1*   < > 9.9* 9.1* 7.5* 8.5* 8.2*  HCT 30.1*   < > 28.7* 26.6* 23.1* 26.1* 24.3*  MCV 106.4*  --  104.7* 106.0* 108.5* 108.8* 108.5*  PLT 106*  --  99* 91* 63* 60* 65*   < > = values in this interval not displayed.   Basic Metabolic Panel: Recent Labs  Lab 10/09/20 1055 10/09/20 1412 10/09/20 2023 10/10/20 0547 10/10/20 1254 10/11/20 0554 10/12/20 0222  NA 133* 136 133* 136  --  136 133*  K 4.9 5.0 4.8 4.6  --  3.7 3.4*  CL 102  --  100  104  --  100 98  CO2 21*  --  21* 21*  --  21* 25  GLUCOSE 120*  --  113* 107*  --  98 121*  BUN 76*  --  74* 78*  --  51* 31*  CREATININE 3.35*  --  3.21* 3.41*  --  2.95* 2.33*  CALCIUM 8.7*  --  8.7* 9.2  --  9.3 9.0  PHOS  --   --  4.9*  --  3.3  --   --    GFR: Estimated Creatinine Clearance: 28.1 mL/min (A) (by C-G formula based on SCr of 2.33 mg/dL (H)). Liver Function Tests: Recent Labs  Lab 10/09/20 1055 10/09/20 2023 10/10/20 0547 10/11/20 0554 10/12/20 0222  AST 61*  --  85* 77* 74*  ALT 46*  --  44 37 36  ALKPHOS 106  --  85 57 66  BILITOT 4.9*  --  6.4* 6.8* 5.3*  PROT 7.2  --  6.7 6.6 6.7  ALBUMIN 2.2* 2.1* 2.8* 3.6 3.7   Recent Labs  Lab 10/09/20 1055  LIPASE 68*   Recent Labs  Lab  10/09/20 1055 10/09/20 2023 10/10/20 0547 10/11/20 0554  AMMONIA 190* 86* 79* 36*   Coagulation Profile: Recent Labs  Lab 10/09/20 1042 10/10/20 0547 10/11/20 0554 10/12/20 0222  INR 1.7* 1.9* 2.0* 1.9*   Cardiac Enzymes: No results for input(s): CKTOTAL, CKMB, CKMBINDEX, TROPONINI in the last 168 hours. BNP (last 3 results) No results for input(s): PROBNP in the last 8760 hours. HbA1C: No results for input(s): HGBA1C in the last 72 hours. CBG: Recent Labs  Lab 10/09/20 0851  GLUCAP 110*   Lipid Profile: No results for input(s): CHOL, HDL, LDLCALC, TRIG, CHOLHDL, LDLDIRECT in the last 72 hours. Thyroid Function Tests: No results for input(s): TSH, T4TOTAL, FREET4, T3FREE, THYROIDAB in the last 72 hours. Anemia Panel: Recent Labs    10/10/20 1254  FERRITIN 943*  TIBC 125*  IRON 123   Urine analysis:    Component Value Date/Time   COLORURINE AMBER (A) 10/09/2020 1153   APPEARANCEUR CLEAR 10/09/2020 1153   LABSPEC 1.016 10/09/2020 1153   PHURINE 5.0 10/09/2020 1153   GLUCOSEU NEGATIVE 10/09/2020 1153   HGBUR NEGATIVE 10/09/2020 1153   BILIRUBINUR NEGATIVE 10/09/2020 1153   KETONESUR NEGATIVE 10/09/2020 1153   PROTEINUR NEGATIVE 10/09/2020 1153   UROBILINOGEN 1.0 07/23/2014 0402   NITRITE NEGATIVE 10/09/2020 1153   LEUKOCYTESUR SMALL (A) 10/09/2020 1153   Sepsis Labs: @LABRCNTIP (procalcitonin:4,lacticidven:4)  ) Recent Results (from the past 240 hour(s))  Blood Culture (routine x 2)     Status: Abnormal   Collection Time: 10/09/20  9:44 AM   Specimen: BLOOD  Result Value Ref Range Status   Specimen Description BLOOD RIGHT ANTECUBITAL  Final   Special Requests   Final    BOTTLES DRAWN AEROBIC AND ANAEROBIC Blood Culture results may not be optimal due to an inadequate volume of blood received in culture bottles   Culture  Setup Time   Final    GRAM POSITIVE COCCI IN CLUSTERS AEROBIC BOTTLE ONLY Organism ID to follow CRITICAL RESULT CALLED TO, READ  BACK BY AND VERIFIED WITH: S GRUBER PHARMD 1301 10/10/20 A BROWNING    Culture (A)  Final    STAPHYLOCOCCUS CAPITIS THE SIGNIFICANCE OF ISOLATING THIS ORGANISM FROM A SINGLE VENIPUNCTURE CANNOT BE PREDICTED WITHOUT FURTHER CLINICAL AND CULTURE CORRELATION. SUSCEPTIBILITIES AVAILABLE ONLY ON REQUEST. Performed at [facility], [place] [address] [place],  [address]  Report Status 10/12/2020 FINAL  Final  Blood Culture ID Panel (Reflexed)     Status: Abnormal   Collection Time: 10/09/20  9:44 AM  Result Value Ref Range Status   Enterococcus faecalis NOT DETECTED NOT DETECTED Final   Enterococcus Faecium NOT DETECTED NOT DETECTED Final   Listeria monocytogenes NOT DETECTED NOT DETECTED Final   Staphylococcus species DETECTED (A) NOT DETECTED Final    Comment: CRITICAL RESULT CALLED TO, READ BACK BY AND VERIFIED WITH: Marisue Ivan PHARMD 1301 10/10/20 A BROWNING    Staphylococcus aureus (BCID) NOT DETECTED NOT DETECTED Final   Staphylococcus epidermidis NOT DETECTED NOT DETECTED Final   Staphylococcus lugdunensis NOT DETECTED NOT DETECTED Final   Streptococcus species NOT DETECTED NOT DETECTED Final   Streptococcus agalactiae NOT DETECTED NOT DETECTED Final   Streptococcus pneumoniae NOT DETECTED NOT DETECTED Final   Streptococcus pyogenes NOT DETECTED NOT DETECTED Final   A.calcoaceticus-baumannii NOT DETECTED NOT DETECTED Final   Bacteroides fragilis NOT DETECTED NOT DETECTED Final   Enterobacterales NOT DETECTED NOT DETECTED Final   Enterobacter cloacae complex NOT DETECTED NOT DETECTED Final   Escherichia coli NOT DETECTED NOT DETECTED Final   Klebsiella aerogenes NOT DETECTED NOT DETECTED Final   Klebsiella oxytoca NOT DETECTED NOT DETECTED Final   Klebsiella pneumoniae NOT DETECTED NOT DETECTED Final   Proteus species NOT DETECTED NOT DETECTED Final   Salmonella species NOT DETECTED NOT DETECTED Final   Serratia marcescens NOT DETECTED NOT DETECTED Final   Haemophilus  influenzae NOT DETECTED NOT DETECTED Final   Neisseria meningitidis NOT DETECTED NOT DETECTED Final   Pseudomonas aeruginosa NOT DETECTED NOT DETECTED Final   Stenotrophomonas maltophilia NOT DETECTED NOT DETECTED Final   Candida albicans NOT DETECTED NOT DETECTED Final   Candida auris NOT DETECTED NOT DETECTED Final   Candida glabrata NOT DETECTED NOT DETECTED Final   Candida krusei NOT DETECTED NOT DETECTED Final   Candida parapsilosis NOT DETECTED NOT DETECTED Final   Candida tropicalis NOT DETECTED NOT DETECTED Final   Cryptococcus neoformans/gattii NOT DETECTED NOT DETECTED Final    Comment: Performed at [facility], [address], [place], [place] [address]  Blood Culture (routine x 2)     Status: None (Preliminary result)   Collection Time: 10/09/20  9:51 AM   Specimen: BLOOD RIGHT HAND  Result Value Ref Range Status   Specimen Description BLOOD RIGHT HAND  Final   Special Requests   Final    BOTTLES DRAWN AEROBIC AND ANAEROBIC Blood Culture adequate volume   Culture   Final    NO GROWTH 3 DAYS Performed at [facility], [place] [address], [place], [place] [address]    Report Status PENDING  Incomplete  Resp Panel by RT-PCR (Flu A&B, Covid) Nasopharyngeal Swab     Status: None   Collection Time: 10/09/20  9:51 AM   Specimen: Nasopharyngeal Swab; Nasopharyngeal(NP) swabs in vial transport medium  Result Value Ref Range Status   SARS Coronavirus 2 by RT PCR NEGATIVE NEGATIVE Final    Comment: (NOTE) SARS-CoV-2 target nucleic acids are NOT DETECTED.  The SARS-CoV-2 RNA is generally detectable in upper respiratory specimens during the acute phase of infection. The lowest concentration of SARS-CoV-2 viral copies this assay can detect is 138 copies/mL. A negative result does not preclude SARS-Cov-2 infection and should not be used as the sole basis for treatment or other patient management decisions. A negative result may occur with  improper specimen  collection/handling, submission of specimen other than  nasopharyngeal swab, presence of viral mutation(s) within the areas targeted by this assay, and inadequate number of viral copies(<138 copies/mL). A negative result must be combined with clinical observations, patient history, and epidemiological information. The expected result is Negative.  Fact Sheet for Patients:  EntrepreneurPulse.com.au  Fact Sheet for Healthcare Providers:  IncredibleEmployment.be  This test is no t yet approved or cleared by the [place] FDA and  has been authorized for detection and/or diagnosis of SARS-CoV-2 by FDA under an Emergency Use Authorization (EUA). This EUA will remain  in effect (meaning this test can be used) for the duration of the COVID-19 declaration under Section 564(b)(1) of the Act, 21 U.S.C.section 360bbb-3(b)(1), unless the authorization is terminated  or revoked sooner.       Influenza A by PCR NEGATIVE NEGATIVE Final   Influenza B by PCR NEGATIVE NEGATIVE Final    Comment: (NOTE) The Xpert Xpress SARS-CoV-2/FLU/RSV plus assay is intended as an aid in the diagnosis of influenza from Nasopharyngeal swab specimens and should not be used as a sole basis for treatment. Nasal washings and aspirates are unacceptable for Xpert Xpress SARS-CoV-2/FLU/RSV testing.  Fact Sheet for Patients: EntrepreneurPulse.com.au  Fact Sheet for Healthcare Providers: IncredibleEmployment.be  This test is not yet approved or cleared by the [place] FDA and has been authorized for detection and/or diagnosis of SARS-CoV-2 by FDA under an Emergency Use Authorization (EUA). This EUA will remain in effect (meaning this test can be used) for the duration of the COVID-19 declaration under Section 564(b)(1) of the Act, 21 U.S.C. section 360bbb-3(b)(1), unless the authorization is terminated or revoked.  Performed at [facility], [place] [address], [place], [place] [address]   Urine culture     Status: None   Collection Time: 10/09/20 11:53 AM   Specimen: In/Out Cath Urine  Result Value Ref Range Status   Specimen Description IN/OUT CATH URINE  Final   Special Requests NONE  Final   Culture   Final    NO GROWTH Performed at [facility], [place] [address] [place], [place] [address]    Report Status 10/10/2020 FINAL  Final  MRSA PCR Screening     Status: None   Collection Time: 10/10/20  1:14 PM   Specimen: Nasal Mucosa; Nasopharyngeal  Result Value Ref Range Status   MRSA by PCR NEGATIVE NEGATIVE Final    Comment:        The GeneXpert MRSA Assay (FDA approved for NASAL specimens only), is one component of a comprehensive MRSA colonization surveillance program. It is not intended to diagnose MRSA infection nor to guide or monitor treatment for MRSA infections. Performed at [facility], [place] [address], [place], [place] [address]   Gram stain     Status: None   Collection Time: 10/10/20  3:10 PM   Specimen: Abdomen; Peritoneal Fluid  Result Value Ref Range Status   Specimen Description FLUID ABDOMEN PERITONEAL  Final   Special Requests NONE  Final   Gram Stain   Final    WBC PRESENT, PREDOMINANTLY PMN NO ORGANISMS SEEN CYTOSPIN SMEAR Performed at [facility], [place] [address], [place],  [address]    Report Status 10/11/2020 FINAL  Final  Culture, body fluid w Gram Stain-bottle     Status: None (Preliminary result)   Collection Time: 10/10/20  3:10 PM   Specimen: Fluid  Result Value Ref Range Status   Specimen Description FLUID PERITONEAL ABDOMEN  Final   Special  Requests NONE  Final   Culture   Final    NO GROWTH 2 DAYS Performed at [facility], [place] [address], [place], [place] [address]    Report Status PENDING  Incomplete      Studies: No results found.  Scheduled Meds: . (feeding supplement) PROSource Plus  30 mL Oral BID BM  .  Chlorhexidine Gluconate Cloth  6 each Topical Q0600  . Chlorhexidine Gluconate Cloth  6 each Topical Q0600  . darbepoetin (ARANESP) injection - DIALYSIS  60 mcg Intravenous Q Sat-HD  . feeding supplement  1 Container Oral TID BM  . feeding supplement  237 mL Oral TID BM  . lactulose  20 g Oral TID  . midodrine  10 mg Oral TID  . multivitamin  1 tablet Oral QHS  . phytonadione  5 mg Oral Once  . rifaximin  550 mg Oral BID    Continuous Infusions: . sodium chloride    . sodium chloride    . albumin human 25 g (10/12/20 1057)  . ceFEPime (MAXIPIME) IV 1 g (10/12/20 1144)  . phytonadione (VITAMIN K) IV Stopped (10/11/20 1437)     LOS: 3 days     Kayleen Memos, MD [facility] Pager [phone]  If 7PM-7AM, please contact night-coverage [email or web address] Password [facility] 10/12/2020, 4:34 PM

## 2020-10-12 NOTE — Progress Notes (Addendum)
[facility] Progress Note  CC:  Hepatic encephalopathy  Subjective:  He is currently in dialysis. He is alert. No N/V. No abdominal pain. He passed 2 loose brown stools earlier today and he is asking for the bedpan to likely pass a 3rd loose stool of the day. He is a bit bothered by the loose stools which I explained was likely due to the Lactulose, typically 3 to 4 loose BMs is to be expected. No CP. No SOB. Liver MRI results pending.    Objective:  Vital signs in last 24 hours: Temp:  [97.6 F (36.4 C)-98.5 F (36.9 C)] 98.5 F (36.9 C) (04/09 1500) Pulse Rate:  [90-106] 95 (04/09 1530) Resp:  [13-16] 16 (04/09 1500) BP: (88-118)/(61-89) 100/61 (04/09 1530) SpO2:  [95 %-100 %] 97 % (04/09 1500) Weight:  [80.7 kg-81.5 kg] 81.5 kg (04/09 1500) Last BM Date: 10/11/20 General:   Alert in NAD.  Eyes: Mild scleral icterus.  Heart: Irregular rhythm, no murmur.  Pulm:  Breath sounds clear throughout.  Abdomen: Gaseous distension, small amount of ascites likely. + BS x 4 quads.  Extremities:  No edema. Neurologic:  Alert and  oriented x 4. Speech is clear. Moves all extremities. UEs mildly tremulous without asterixis.  Psych:  Alert and cooperative. Normal mood and affect. Skin: + Jaundice.   Intake/Output from previous day: 04/08 0701 - 04/09 0700 In: 738 [P.O.:738] Out: 1500 [Stool:500] Intake/Output this shift: No intake/output data recorded.  Lab Results: Recent Labs    10/11/20 0554 10/11/20 1431 10/12/20 0222  WBC 5.3 6.0 5.9  HGB 7.5* 8.5* 8.2*  HCT 23.1* 26.1* 24.3*  PLT 63* 60* 65*   BMET Recent Labs    10/10/20 0547 10/11/20 0554 10/12/20 0222  NA 136 136 133*  K 4.6 3.7 3.4*  CL 104 100 98  CO2 21* 21* 25  GLUCOSE 107* 98 121*  BUN 78* 51* 31*  CREATININE 3.41* 2.95* 2.33*  CALCIUM 9.2 9.3 9.0   LFT Recent Labs    10/12/20 0222  PROT 6.7  ALBUMIN 3.7  AST 74*  ALT 36  ALKPHOS 66  BILITOT 5.3*   PT/INR Recent Labs     10/11/20 0554 10/12/20 0222  LABPROT 22.3* 21.4*  INR 2.0* 1.9*   Hepatitis Panel Recent Labs    10/11/20 1227  HEPBSAG NON REACTIVE    No results found.  Assessment / Plan:  1. Acute hepatic encephalopathy.Head CT negative. Received Lactulose enemas -> transitioned to Lactulose po resulting in 2 to 3 loose stools today.  Ammonia level 189 -> 36.  -Continue Lactulose 20gm po tid, titrate to 3 to 4 BMs daily -Continue Xifaxan 550mg  po bid  -Monitor neuro status closely -Further recommendations per Dr. Loletha Carrow   2. Decompensated EtOH cirrhosis. Esophageal varices and severe portal hypertensive gastropathy per EGD 03/2020.  Splenomegaly. Ascites. Meld-Na31. Admission discriminant function score 34. Patient has been abstinent of alcohol since diagnosis with cirrhosis, [place] in July 2021. Requiring frequent paracenteses. Last paracentesis was on 4/7, 5.8L of peritoneal fluid removed. No SBP. LFTs stable.  -Albumin 25gm IV Q 6 hours -2gm low sodium diet   3. Hepatocellular carcinoma treated 02/2020 withY 92and nivolumab. Noncontrast CTAP 10/09/2020:Large amount of ascites in the abdomen, pelvis. Cirrhosis. Multifocal hepatocellular carcinoma. Dominant lesion in segment 2 similar to imaging in 06/2020. Unable to assess other lesions due to lack of IV contrast. Esophageal and upper abdominal varices. Nonspecific mild, diffuse colon wall thickening. GB sludge, tiny stones.  Trace bilateral pleural effusions. Liver MRI done earlier today, results pending. -Await liver MRI results  -Palliative care following  -Followed by Dr. Benay Spice   4. ESRD. HD initiated on 4/6. Dialyzed on 4/8 and today.   5. Hyponatremia. Na+ 133.   6. Macrocytic anemia. Hb 9.1 -> 8.5 -> 8.2.   7. Thrombocytopenia. Platelets91 -> 60 -> 65. .   8. Coagulopathy. INR 1.7>> 5 mg po Vit K >> 1.9 >> 2.0. Vitamin K 5mg  IV x 3. Today INR 1.9.  -PT/INR in am   9. Rapid atrial fibrillation. Cardizem drip  initiated. Anticoagulation not initiated Elevated BNP 2740. Hemodynamically stable.   Active Problems:   Atrial fibrillation with RVR (HCC)   Sepsis (HCC)   Delirium   Malnutrition of moderate degree   Acute metabolic encephalopathy   Alcoholic cirrhosis of liver with ascites ([place])     LOS: 3 days   Dillon Perkins  10/12/2020, 4:19 PM   I have discussed the case with the APP, and that is the plan I formulated. I personally interviewed and examined the patient.  Signout received from Dr. Rush Landmark, extensive chart review performed.  I saw [name] in his hospital room with his son Maylon Cos at the bedside.  He had gotten back from dialysis a little while ago and is also had his liver MRI, the report of which is not yet out.  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  His mentation is a little slow, and his speech is slow but fluent.  He asks a few questions over again and wondered if he was still going to have dialysis, so his son was able to gently redirect him.  Understandably, they are anxious to know the results of the MRI and what might be next step for treatment of the liver cancer.  They were apparently going to meet with transplant hepatology at [facility] for evaluation before this hospitalization occurred.  Ultimately, he has decompensated liver disease with recurrent liver cancer, large volume ascites requiring paracentesis and now severe hepatic encephalopathy. We will await the report of his liver MRI, but given the multifocal nature and size of lesion seen on CT scan, I do not think he is likely to be a liver transplant candidate. His overall prognosis is poor, and he was evaluated earlier today by palliative care, which I think is appropriate.  As encephalopathy improves further, hopefully we can scale back the lactulose a little as long as his clinical condition allows.  He will continue dialysis, as needed paracentesis and we  will see him tomorrow.  I spent a total of 35 minutes with the patient reviewing hospital notes, imaging reports, pathology (if applicable),  labs and examining the patient as well as establishing an assessment and plan that was discussed with the patient.  > 50% of time was spent in direct patient care.     Nelida Meuse III Office: [phone]

## 2020-10-12 NOTE — Progress Notes (Addendum)
Notified by RN that HR 140-160. Has hx of A-fib but currently not on medication for rate control.  EKG obtained which shows a-fib with RVR. BP has been soft at 95/58 earlier and now is 110/70.  Started on amiodarone infusion.  Monitor closely

## 2020-10-12 NOTE — Progress Notes (Addendum)
[facility] Progress Note   Subjective:   Patient seen and examined at bedside.  Feeling much better today.  Eating a little, would like to try protein shakes.  Biggest complaint is diarrhea.    Objective Vitals:   10/11/20 1439 10/11/20 1709 10/11/20 1936 10/12/20 0432  BP: 91/64 (!) 88/64 106/75 118/89  Pulse: 95 91 97 100  Resp: 17 13 16 15   Temp: 97.9 F (36.6 C) 97.6 F (36.4 C) 97.6 F (36.4 C) 97.7 F (36.5 C)  TempSrc: Oral Oral Oral Oral  SpO2: 100% 95% 100% 99%  Weight:    80.7 kg  Height:       Physical Exam General:pleasant, alert male in NAD Heart:RRR, no mrg Lungs:CTAB, nml WOB on RA Abdomen:soft, mildly distended, NT Extremities:no LE edema Dialysis Access: [facility]   Filed Weights   10/11/20 0950 10/11/20 1312 10/12/20 0432  Weight: 81.8 kg 80.8 kg 80.7 kg    Intake/Output Summary (Last 24 hours) at 10/12/2020 0723 Last data filed at 10/11/2020 2000 Gross per 24 hour  Intake 738 ml  Output 1500 ml  Net -762 ml    Additional Objective Labs: Basic Metabolic Panel: Recent Labs  Lab 10/09/20 2023 10/10/20 0547 10/10/20 1254 10/11/20 0554 10/12/20 0222  NA 133* 136  --  136 133*  K 4.8 4.6  --  3.7 3.4*  CL 100 104  --  100 98  CO2 21* 21*  --  21* 25  GLUCOSE 113* 107*  --  98 121*  BUN 74* 78*  --  51* 31*  CREATININE 3.21* 3.41*  --  2.95* 2.33*  CALCIUM 8.7* 9.2  --  9.3 9.0  PHOS 4.9*  --  3.3  --   --    Liver Function Tests: Recent Labs  Lab 10/10/20 0547 10/11/20 0554 10/12/20 0222  AST 85* 77* 74*  ALT 44 37 36  ALKPHOS 85 57 66  BILITOT 6.4* 6.8* 5.3*  PROT 6.7 6.6 6.7  ALBUMIN 2.8* 3.6 3.7   Recent Labs  Lab 10/09/20 1055  LIPASE 68*   CBC: Recent Labs  Lab 10/09/20 1055 10/09/20 1412 10/09/20 2023 10/10/20 0547 10/11/20 0554 10/11/20 1431 10/12/20 0222  WBC 7.2  --  7.1 6.9 5.3 6.0 5.9  NEUTROABS 6.2  --   --  5.5 4.1  --   --   HGB 10.1*   < > 9.9* 9.1* 7.5* 8.5* 8.2*  HCT 30.1*   < > 28.7*  26.6* 23.1* 26.1* 24.3*  MCV 106.4*  --  104.7* 106.0* 108.5* 108.8* 108.5*  PLT 106*  --  99* 91* 63* 60* 65*   < > = values in this interval not displayed.   Blood Culture    Component Value Date/Time   SDES FLUID ABDOMEN PERITONEAL 10/10/2020 1510   SDES FLUID PERITONEAL ABDOMEN 10/10/2020 1510   SPECREQUEST NONE 10/10/2020 1510   SPECREQUEST NONE 10/10/2020 1510   CULT  10/10/2020 1510    NO GROWTH < 12 HOURS Performed at [facility], [place] [address], [place], [place] [address]    REPTSTATUS 10/11/2020 FINAL 10/10/2020 1510   REPTSTATUS PENDING 10/10/2020 1510   CBG: Recent Labs  Lab 10/09/20 0851  GLUCAP 110*   Iron Studies:  Recent Labs    10/10/20 1254  IRON 123  TIBC 125*  FERRITIN 943*   Lab Results  Component Value Date   INR 1.9 (H) 10/12/2020   INR 2.0 (H) 10/11/2020   INR 1.9 (  H) 10/10/2020   Studies/Results: IR Paracentesis  Result Date: 10/10/2020 INDICATION: History of multifocal hepatocellular cancer. Recurrent ascites. Request for diagnostic and therapeutic paracentesis. EXAM: ULTRASOUND GUIDED LEFT LOWER QUADRANT PARACENTESIS MEDICATIONS: 1% plain lidocaine, 5 mL COMPLICATIONS: None immediate. PROCEDURE: Informed written consent was obtained from the patient after a discussion of the risks, benefits and alternatives to treatment. A timeout was performed prior to the initiation of the procedure. Initial ultrasound scanning demonstrates a large amount of ascites within the left lower abdominal quadrant. The left lower abdomen was prepped and draped in the usual sterile fashion. 1% lidocaine was used for local anesthesia. Following this, a 19 gauge, 7-cm, Yueh catheter was introduced. An ultrasound image was saved for documentation purposes. The paracentesis was performed. The catheter was removed and a dressing was applied. The patient tolerated the procedure well without immediate post procedural complication. FINDINGS: A total of approximately 5.8 L  of clear yellow fluid was removed. Samples were sent to the laboratory as requested by the clinical team. IMPRESSION: Successful ultrasound-guided paracentesis yielding 5.8 liters of peritoneal fluid. Read by: Ascencion Dike PA-C Electronically Signed   By: Sandi Mariscal M.D.   On: 10/10/2020 15:40    Medications: . sodium chloride    . sodium chloride    . albumin human 25 g (10/12/20 0438)  . ceFEPime (MAXIPIME) IV 1 g (10/11/20 1435)  . phytonadione (VITAMIN K) IV Stopped (10/11/20 1437)   . (feeding supplement) PROSource Plus  30 mL Oral BID BM  . Chlorhexidine Gluconate Cloth  6 each Topical Q0600  . Chlorhexidine Gluconate Cloth  6 each Topical Q0600  . darbepoetin (ARANESP) injection - DIALYSIS  60 mcg Intravenous Q Sat-HD  . feeding supplement  1 Container Oral TID BM  . feeding supplement  237 mL Oral TID BM  . lactulose  20 g Oral TID  . midodrine  10 mg Oral TID  . multivitamin  1 tablet Oral QHS  . phytonadione  5 mg Oral Once  . potassium chloride  40 mEq Oral Once  . rifaximin  550 mg Oral BID    Dialysis Orders: TTS at [facility] -- brand new, only had 1 treatment there on 4/5, establishing orders 2.5hr today -> 3hr -> 3.5hr. EDW ~87kg, 2K/2.5Ca, TDC, no heparin  Assessment/Plan: 1.Sepsis/Bacteremia: BCx 4/6 growing GPC in 1 of 2 bottles. On Vanc/Cefepime. Repeat paracentesis with yield 5.8L yesterday. Peritoneal fluid culture with NGTD. 2. Hepatic encephalopathy/decompensated liver failure: Hx cirrhosis/HCC s/p immunotherapy: Ammonia 190 on admit -> now 86 -> 79 > 36, follow. S/p Vit K. On lactulose and rifaximin, per primary.  Per oncology to have MRI with & w/o contrast for staging today around 10am with dialysis to follow in the afternoon.  3. A-Fib RVR:S/p diltiazem drip d/c on 4/7.  Rate improved now 90s-105. 4. ESRD:New start -> titrating up dialysis Rx.  HD today to get back on outpatient schedule. To be done post MRI due to contrast.  Depending on type of contrast  used may need dialysis again tomorrow.  To discuss with radiology.  5.HTN/volume:BP low/stable, on midodrine 10mg  TID. Albumin prn for BP support. Does not appear volume overloaded.  Minimal UF with HD.  6. Anemiaof ESRD:Hgb drop 7.5, back up to 8.2 today, tsat 99%, no iron. Aranesp 20mcg ordered to start tomorrow.  Transfuse prn.  7. Secondary hyperparathyroidism:Corrected Ca and phos in goal.  Not on binders or VDRA.  8. Nutrition:Alb 3.7.  Add protein supplement.   9. Dispo -  if MRI shows worsening of [facility] plans to refer to hospice per notes. Patient DNR.  Jen Mow, PA-C [facility] 10/12/2020,7:23 AM  LOS: 3 days   Pt seen, examined and agree w assess/plan as above with additions as indicated. MRI contrast used here now is not the same that was associated w/ NSF in the past. There are few to zero cases of NSF seen w/ class II Gadolinium-based contrast agents. Lawana Pai is a class II GBCA agent and is the only contrast used at [facility].  Will plan only one post contrast dialysis session today, does not need extra session tomorrow.  [facility] 10/12/2020, 10:47 AM

## 2020-10-12 NOTE — Evaluation (Signed)
Physical Therapy Evaluation Patient Details Name: Dillon Perkins MRN: [id number] DOB: 1950-12-20 Today's Date: 10/12/2020   History of Present Illness  Pt is a 70 y.o. male admitted 10/09/20 with AMS; Septic, Workup for acute hepatic encephalopathy, afib with RVR. Per oncology, pt has likely developed hepatorenal syndrome due to worsening HCC. Pt hypotensive during HD 4/7. S/p paracentesis from LLQ on 4/7. PMH includes hepatocellular carcinoma (s/p radioembolization; immunotherapy), alcoholic cirrhosis w/ portal HTN, PAF, CKD V (recently started HD TTS).  Clinical Impression  Pt admitted with above diagnosis. Pt able to ambulate with physical therapist, using rolling walker up to 175 feet today, asymptomatic. HR varied 117-130s otherwise VSS. No loss of balance, demonstrates generalized weakness. May benefit from home health visit to assess living environment and make further recommendations for safety in home. Pt currently with functional limitations due to the deficits listed below (see PT Problem List). Pt will benefit from skilled PT to increase their independence and safety with mobility to allow discharge to the venue listed below.       Follow Up Recommendations Home health PT (home safety visit)    Equipment Recommendations  3in1 (PT);Rolling walker with 5" wheels    Recommendations for Other Services       Precautions / Restrictions Precautions Precautions: Fall Restrictions Weight Bearing Restrictions: No      Mobility  Bed Mobility Overal bed mobility: Needs Assistance Bed Mobility: Supine to Sit;Sit to Supine     Supine to sit: Supervision Sit to supine: Supervision   General bed mobility comments: Supervison for safety, requires extra time and assist to manage lines/leads.    Transfers Overall transfer level: Needs assistance Equipment used: Rolling walker (2 wheeled) Transfers: Sit to/from Stand Sit to Stand: Supervision         General transfer comment:  Supervision for safety, cues for hand placement, performed from bed. No LOB noted with UE support on RW.  Ambulation/Gait Ambulation/Gait assistance: Supervision Gait Distance (Feet): 175 Feet Assistive device: Rolling walker (2 wheeled) Gait Pattern/deviations: Step-through pattern;Drifts right/left Gait velocity: decreased   General Gait Details: Close supervision for safety, assist with lines/leads only. Cues for awareness and proper DME use with RW. No overt LOB noted however showing some drifting and decreased reactions when drifting towards walls and other objects in the hallway but able to self correct.  Stairs            Wheelchair Mobility    Modified Rankin (Stroke Patients Only)       Balance Overall balance assessment: Mild deficits observed, not formally tested                                           Pertinent Vitals/Pain Pain Assessment: No/denies pain    Home Living Family/patient expects to be discharged to:: Private residence Living Arrangements: Spouse/significant other Available Help at Discharge: Family;Available PRN/intermittently Type of Home: House Home Access: Stairs to enter Entrance Stairs-Rails: Left (wall on rt) Entrance Stairs-Number of Steps: 5 Home Layout: One level Home Equipment: None      Prior Function Level of Independence: Independent               Hand Dominance   Dominant Hand: Right    Extremity/Trunk Assessment   Upper Extremity Assessment Upper Extremity Assessment: Defer to OT evaluation    Lower Extremity Assessment Lower Extremity Assessment: Generalized weakness  Communication   Communication: No difficulties  Cognition Arousal/Alertness: Awake/alert Behavior During Therapy: WFL for tasks assessed/performed Overall Cognitive Status: Impaired/Different from baseline Area of Impairment: Safety/judgement;Awareness;Orientation                 Orientation Level:  Disoriented to;Time ("2002")       Safety/Judgement: Decreased awareness of safety;Decreased awareness of deficits Awareness: Emergent          General Comments General comments (skin integrity, edema, etc.): Start of therapy BP 108/63, HR 104bpm, SpO2 100% on room air; Ambulating HR up to 130s; aftre therapy BP 117/84, HR back down into the 110s.    Exercises     Assessment/Plan    PT Assessment Patient needs continued PT services  PT Problem List Decreased strength;Decreased activity tolerance;Decreased balance;Decreased mobility;Decreased knowledge of use of DME;Decreased safety awareness       PT Treatment Interventions DME instruction;Gait training;Stair training;Functional mobility training;Therapeutic activities;Therapeutic exercise;Balance training;Neuromuscular re-education;Patient/family education    PT Goals (Current goals can be found in the Care Plan section)  Acute Rehab PT Goals Patient Stated Goal: Get back home PT Goal Formulation: With patient/family Time For Goal Achievement: 10/26/20 Potential to Achieve Goals: Good    Frequency Min 3X/week   Barriers to discharge        Co-evaluation               AM-PAC PT "6 Clicks" Mobility  Outcome Measure Help needed turning from your back to your side while in a flat bed without using bedrails?: None Help needed moving from lying on your back to sitting on the side of a flat bed without using bedrails?: None Help needed moving to and from a bed to a chair (including a wheelchair)?: None Help needed standing up from a chair using your arms (e.g., wheelchair or bedside chair)?: None Help needed to walk in hospital room?: A Little Help needed climbing 3-5 steps with a railing? : A Little 6 Click Score: 22    End of Session Equipment Utilized During Treatment: Gait belt Activity Tolerance: Patient tolerated treatment well Patient left: in bed;with call bell/phone within reach;with family/visitor  present   PT Visit Diagnosis: Other abnormalities of gait and mobility (R26.89);Muscle weakness (generalized) (M62.81)    Time: 1008-1040 PT Time Calculation (min) (ACUTE ONLY): 32 min   Charges:   PT Evaluation $PT Eval Low Complexity: 1 Low PT Treatments $Gait Training: 8-22 mins        Elayne Snare, PT, DPT  Ellouise Newer 10/12/2020, 11:39 AM

## 2020-10-13 ENCOUNTER — Encounter (HOSPITAL_COMMUNITY): Payer: Self-pay | Admitting: Internal Medicine

## 2020-10-13 DIAGNOSIS — G9341 Metabolic encephalopathy: Secondary | ICD-10-CM | POA: Diagnosis not present

## 2020-10-13 DIAGNOSIS — Z515 Encounter for palliative care: Secondary | ICD-10-CM | POA: Diagnosis not present

## 2020-10-13 DIAGNOSIS — Z7189 Other specified counseling: Secondary | ICD-10-CM | POA: Diagnosis not present

## 2020-10-13 DIAGNOSIS — Z66 Do not resuscitate: Secondary | ICD-10-CM | POA: Diagnosis not present

## 2020-10-13 LAB — CBC
HCT: 27.6 % — ABNORMAL LOW (ref 39.0–52.0)
Hemoglobin: 9.1 g/dL — ABNORMAL LOW (ref 13.0–17.0)
MCH: 35.8 pg — ABNORMAL HIGH (ref 26.0–34.0)
MCHC: 33 g/dL (ref 30.0–36.0)
MCV: 108.7 fL — ABNORMAL HIGH (ref 80.0–100.0)
Platelets: 62 10*3/uL — ABNORMAL LOW (ref 150–400)
RBC: 2.54 MIL/uL — ABNORMAL LOW (ref 4.22–5.81)
RDW: 16.9 % — ABNORMAL HIGH (ref 11.5–15.5)
WBC: 6.4 10*3/uL (ref 4.0–10.5)
nRBC: 0.8 % — ABNORMAL HIGH (ref 0.0–0.2)

## 2020-10-13 LAB — COMPREHENSIVE METABOLIC PANEL
ALT: 38 U/L (ref 0–44)
AST: 65 U/L — ABNORMAL HIGH (ref 15–41)
Albumin: 3.7 g/dL (ref 3.5–5.0)
Alkaline Phosphatase: 86 U/L (ref 38–126)
Anion gap: 7 (ref 5–15)
BUN: 23 mg/dL (ref 8–23)
CO2: 30 mmol/L (ref 22–32)
Calcium: 9.3 mg/dL (ref 8.9–10.3)
Chloride: 96 mmol/L — ABNORMAL LOW (ref 98–111)
Creatinine, Ser: 2.27 mg/dL — ABNORMAL HIGH (ref 0.61–1.24)
GFR, Estimated: 30 mL/min — ABNORMAL LOW (ref >60)
Glucose, Bld: 108 mg/dL — ABNORMAL HIGH (ref 70–99)
Potassium: 3.6 mmol/L (ref 3.5–5.1)
Sodium: 133 mmol/L — ABNORMAL LOW (ref 135–145)
Total Bilirubin: 4.2 mg/dL — ABNORMAL HIGH (ref 0.3–1.2)
Total Protein: 6.6 g/dL (ref 6.5–8.1)

## 2020-10-13 LAB — PROTIME-INR
INR: 1.9 — ABNORMAL HIGH (ref 0.8–1.2)
Prothrombin Time: 20.7 seconds — ABNORMAL HIGH (ref 11.4–15.2)

## 2020-10-13 MED ORDER — ALBUMIN HUMAN 25 % IV SOLN
25.0000 g | Freq: Four times a day (QID) | INTRAVENOUS | Status: AC
  Administered 2020-10-13 – 2020-10-14 (×4): 25 g via INTRAVENOUS
  Filled 2020-10-13 (×4): qty 100

## 2020-10-13 MED ORDER — SODIUM CHLORIDE 0.9% FLUSH
10.0000 mL | Freq: Two times a day (BID) | INTRAVENOUS | Status: DC
  Administered 2020-10-13: 10 mL

## 2020-10-13 MED ORDER — DILTIAZEM HCL 30 MG PO TABS
15.0000 mg | ORAL_TABLET | Freq: Two times a day (BID) | ORAL | Status: DC
  Administered 2020-10-13 – 2020-10-14 (×2): 15 mg via ORAL
  Filled 2020-10-13 (×3): qty 0.5

## 2020-10-13 MED ORDER — DILTIAZEM HCL 60 MG PO TABS: 30.0000 mg | ORAL_TABLET | Freq: Two times a day (BID) | ORAL | Status: DC

## 2020-10-13 MED ORDER — SODIUM CHLORIDE 0.9% FLUSH: 10.0000 mL | INTRAVENOUS | Status: DC | PRN

## 2020-10-13 NOTE — Progress Notes (Addendum)
[facility] Progress Note  CC:  Hepatic encephalopathy  Subjective:  He stated feeling great today. No N/V. No abdominal pain. No obvious confusion. His appetite has improved. He is able to taste food now. He had 4 loose BMs last night one BM this morning which he stated was darker brown, not black. No rectal bleeding. He walked in the hallway yesterday and plans to do so again with the assistance of the nursing staff this afternoon. His wife is at the bedside.   Objective:   Abdominal MRI W/WO contrast 10/12/2020:  1. 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 Many of the previously noted suspicious lesions have regressed or disappeared following prior embolization procedure. The other lesions which remain are generally stable compared to the prior study, with the exception of the lesion in the right lobe of the liver between segments 5 and 6 which is slightly larger than the prior study, but demonstrates no definitive internal enhancement. Continued attention on follow-up studies is recommended to ensure stability or regression. 2. Moderate to large volume of ascites. 3. Multiple small cystic lesions scattered throughout the pancreas measuring 1.1 cm in size or less, stable compared to prior studies, likely to represent benign lesions as discussed above. Attention at time of routine follow-up is recommended to ensure continued stability. 4. Small hiatal hernia. 5. Mild cardiomegaly.   Vital signs in last 24 hours: Temp:  [98.1 F (36.7 C)-98.7 F (37.1 C)] 98.7 F (37.1 C) (04/10 0333) Pulse Rate:  [82-115] 107 (04/10 0333) Resp:  [10-20] 18 (04/10 0333) BP: (91-110)/(58-77) 110/77 (04/10 0333) SpO2:  [95 %-99 %] 99 % (04/10 0333) Weight:  [80.9 kg-81.5 kg] 80.9 kg (04/10 0338) Last BM Date: 10/12/20 General:   Alert 70 year old male in NAD.  Eyes: Scleral icterus present.  Heart: Irregular  rhythm. No murmur.  Pulm: Breath sounds clear throughout.  Abdomen: Soft, ascites present but abdomen is not tense. Nontender. + BS x 4 quads. Umbilical hernia.  Extremities:  Without edema. Neurologic:  Alert and  oriented x 3. No asterixis.  Psych:  Alert and cooperative. Normal mood and affect.  Intake/Output from previous day: 04/09 0701 - 04/10 0700 In: 237 [P.O.:237] Out: 1000  Intake/Output this shift: No intake/output data recorded.  Lab Results: Recent Labs    10/11/20 0554 10/11/20 1431 10/12/20 0222  WBC 5.3 6.0 5.9  HGB 7.5* 8.5* 8.2*  HCT 23.1* 26.1* 24.3*  PLT 63* 60* 65*   BMET Recent Labs    10/11/20 0554 10/12/20 0222  NA 136 133*  K 3.7 3.4*  CL 100 98  CO2 21* 25  GLUCOSE 98 121*  BUN 51* 31*  CREATININE 2.95* 2.33*  CALCIUM 9.3 9.0   LFT Recent Labs    10/12/20 0222  PROT 6.7  ALBUMIN 3.7  AST 74*  ALT 36  ALKPHOS 66  BILITOT 5.3*   PT/INR Recent Labs    10/11/20 0554 10/12/20 0222  LABPROT 22.3* 21.4*  INR 2.0* 1.9*   Hepatitis Panel Recent Labs    10/11/20 1227  HEPBSAG NON REACTIVE    MR LIVER W WO CONTRAST  Result Date: 10/13/2020 CLINICAL DATA:  70 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 Staging examination. Acute hepatic encephalopathy. EXAM: MRI ABDOMEN WITHOUT AND WITH CONTRAST TECHNIQUE: Multiplanar multisequence MR imaging of the abdomen was performed both before and after the administration of intravenous contrast. CONTRAST:  49mL GADAVIST GADOBUTROL 1 MMOL/ML IV  SOLN COMPARISON:  Abdominal MRI 07/04/2020. FINDINGS: Lower chest: Small hiatal hernia.  Mild cardiomegaly. Hepatobiliary: Liver has a shrunken appearance and nodular contour, indicative of advanced cirrhosis. Diffuse loss of signal intensity throughout the hepatic parenchyma on in phase dual echo images and diffuse low T2 signal intensity throughout the hepatic parenchyma, indicative of extensive iron deposition. Numerous T1 and T2 hypointense  nodules scattered throughout the hepatic parenchyma, most of which are subcentimeter in size and do not enhance. In addition, there are 2 larger lesions which are T1 hyperintense and generally T2 hypointense, without enhancement measuring 5.4 x 4.4 cm in segment 2 (axial image 29 of series 10/1) which is stable compared to the prior examination, and 5.8 x 3.9 cm predominantly in segments 5 and 6 (axial image 42 of series 10/1) which is increased in size compared to the prior study (previously 5.3 x 3.2 cm). Both of these lesions demonstrates some degree of diffusion restriction. In segment 3 of the liver there is a T1 hypointense nonenhancing area (axial image 38 of series 1038) which corresponds to a previously noted lesion, likely a indicative of post embolization scarring. Additional lesion in segment 8 of the liver (axial image 34 of series 10/1) measuring 1.3 cm in diameter, stable in retrospect compared to the prior study, also mildly T1 hyperintense, T2 hypointense without definite enhancement on post gadolinium imaging. Other previously noted smaller lesions are not confidently identified on today's examination. No intra or extrahepatic biliary ductal dilatation. Pancreas: Multiple tiny T1 hypointense, T2 hyperintense, nonenhancing lesions are noted throughout the pancreas, largest of which is in the tail of the pancreas measuring only 11 mm, similar in size and number to the prior examination, favored to represent small benign lesions such as pancreatic pseudocysts or side branch IPMNs (intraductal papillary mucinous neoplasms). No definite communication with the main pancreatic duct noted. No peripancreatic fluid collections. Spleen: Spleen is mildly enlarged measuring 11.1 x 6.9 x 10.9 cm (estimated splenic volume of 417 mL) . Adrenals/Urinary Tract: bilateral kidneys and adrenal glands are normal in appearance. No hydroureteronephrosis in the visualized portions of the abdomen. Stomach/Bowel: Visualized  portions are unremarkable. Vascular/Lymphatic: Aortic atherosclerosis, without evidence of aneurysm in the visualized abdominal vasculature. Portal vein is patent and normal in caliber. No lymphadenopathy noted in the abdomen. Other:  Moderate to large volume of ascites. Musculoskeletal: No aggressive appearing osseous lesions are noted in the visualized portions of the skeleton. IMPRESSION: 1. 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 Many of the previously noted suspicious lesions have regressed or disappeared following prior embolization procedure. The other lesions which remain are generally stable compared to the prior study, with the exception of the lesion in the right lobe of the liver between segments 5 and 6 which is slightly larger than the prior study, but demonstrates no definitive internal enhancement. Continued attention on follow-up studies is recommended to ensure stability or regression. 2. Moderate to large volume of ascites. 3. Multiple small cystic lesions scattered throughout the pancreas measuring 1.1 cm in size or less, stable compared to prior studies, likely to represent benign lesions as discussed above. Attention at time of routine follow-up is recommended to ensure continued stability. 4. Small hiatal hernia. 5. Mild cardiomegaly. Electronically Signed   By: Vinnie Langton M.D.   On: 10/13/2020 09:57    Assessment / Plan:  1.Acute hepatic encephalopathy.Head CT negative.Received Lactulose enemas -> transitioned to Lactulose po resulting in 2 to 3 loose stools today.  Ammonia level 189 ->36.  -Continue Lactulose 20gm po tid, titrate to 3 to 4 BMs daily -Continue Xifaxan 550mg  po bid  -Monitor neuro status closely  2. Decompensated EtOH cirrhosis. Esophageal varices and severe portal hypertensive gastropathy per EGD 03/2020.  Splenomegaly. Ascites. Meld-Na31. Admission discriminant function score 34. Patient has  been abstinent of alcohol since diagnosis with cirrhosis, [place] in July 2021. Requiring frequent paracenteses. Last paracentesis was on 4/7, 5.8L of peritoneal fluid removed. No SBP. LFTs stable. Abdominal MRI 4/9 showed moderate to large volume of ascites. See further abd MRI results below.  -Repeat paracentesis next 1 to 2 days, await further recommendations per Dr. Loletha Carrow  -Albumin 25gm IV Q 6 hours -2gm low sodium diet   3. Hepatocellular carcinoma treated 02/2020 withY 92and nivolumab. Noncontrast CTAP 10/09/2020:Large amount of ascites in the abdomen, pelvis. Cirrhosis. Multifocal hepatocellular carcinoma. Dominant lesion in segment 2 similar to imaging in 06/2020. Esophageal and upper abdominal varices. Nonspecific mild, diffuse colon wall thickening. GB sludge, tiny stones. Trace bilateral pleural effusions. Abdominal MRI on 4/9 consistent with cirrhosis with multiple hepatic lesions, prior suspicious lesions have regressed or disappeared following prior embolization procedure. The other lesions which remain are generally stable compared to the prior study, with the exception of the lesion in the right lobe of the liver between segments 5 and 6 which is slightly larger than the prior study. Moderate to large volume of ascites and multiple small cystic lesions scattered throughout the pancreas measuring 1.1 cm in size or less, stable  -Await liver MRI results  -Palliative care following  -Followed by Dr. Benay Spice   4.ESRD. HD initiated on4/6. Last dialyzed on 4/9.   5.Hyponatremia. Na+ 133.   6. Macrocytic anemia. Hb 9.1-> 8.5 -> 8.2.   7.Thrombocytopenia. Platelets91-> 60 -> 65. .  8. Coagulopathy. INR 1.7>> 5 mg po Vit K >> 1.9>> 2.0. Vitamin K 5mg  IV x 3. Today INR 1.9.  -PT/INR in am  9. Rapid atrial fibrillation. Cardizem drip initiated. Anticoagulation not initiated. CHA2DS2-VASc Score = 4 Elevated BNP 2740. Hemodynamically stable.  10. Staph Hominis  bacteremia on Cefepime IV Q 24hrs and Vanco     Active Problems:   Atrial fibrillation with RVR (HCC)   Sepsis (HCC)   Delirium   Malnutrition of moderate degree   Acute metabolic encephalopathy   Alcoholic cirrhosis of liver with ascites ([place])     LOS: 4 days   [facility]  10/13/2020, 10:15 AM  I have discussed the case with the APP, and that is the plan I formulated. I personally interviewed and examined the patient. His wife is at the bedside, and I answered extensive questions and gave him a clinical summary.  His encephalopathy is doing much better, and I will continue this current regimen, aiming for 2-3 BMs per day with adjustments of lactulose dosing as needed.  Stable coagulopathy  A. fib, rate getting under better control but still little over 100 on amiodarone drip.  Ascites on exam, not tense.  He has needed a few paracentesis lately, last was 3 days ago.  He will most likely need another 3 to 4 L paracentesis prior to discharge.  MRI of liver shows evidence of previous interventional treatments, and 2 additional lesions greater than or equal to 5 cm, 1 of which is a little larger than last seen in December.  He needs evaluation by oncology (he usually sees Dr. Benay Spice), after which I expect he will be discussed at the multidisciplinary conference to  see what additional therapy can be offered.  I am concerned that with this decompensation of his liver disease, additional percutaneous treatment might not be an option.  His liver lesion size is also almost certainly preclude liver transplant at present.   We will see him tomorrow, Dr. Hilarie Fredrickson will be managing the consult service.  I spent a total of 35 minutes with the patient reviewing hospital notes, imaging reports, pathology (if applicable),  labs and examining the patient as well as establishing an assessment and plan that was discussed with the patient.  > 50% of time was spent in direct patient care.      Nelida Meuse III Office: [phone]

## 2020-10-13 NOTE — Progress Notes (Signed)
[facility] Progress Note   Subjective:   Patient seen and examined at bedside. A fib with RVR noted last night and started on amiodarone.  States he is feeling well today, hoping to walk around this afternoon with his wife.  Denies CP, SOB, n/v and abdominal pain.    Objective Vitals:   10/12/20 2014 10/13/20 0333 10/13/20 0338 10/13/20 1107  BP: 110/70 110/77  117/82  Pulse: (!) 115 (!) 107    Resp: 14 18  18   Temp: 98.4 F (36.9 C) 98.7 F (37.1 C)    TempSrc: Oral Oral    SpO2: 95% 99%    Weight:   80.9 kg   Height:       Physical Exam General:pleasant, alert male in NAD Heart:RRR, no mrg Lungs:CTAB, nml WOB on RA Abdomen:soft, NT, mildly distended Extremities:no LE edema  Dialysis Access: [facility] c/d/i   Filed Weights   10/12/20 0432 10/12/20 1500 10/13/20 0338  Weight: 80.7 kg 81.5 kg 80.9 kg    Intake/Output Summary (Last 24 hours) at 10/13/2020 1133 Last data filed at 10/12/2020 2000 Gross per 24 hour  Intake 237 ml  Output 1000 ml  Net -763 ml    Additional Objective Labs: Basic Metabolic Panel: Recent Labs  Lab 10/09/20 2023 10/10/20 0547 10/10/20 1254 10/11/20 0554 10/12/20 0222 10/13/20 0958  NA 133*   < >  --  136 133* 133*  K 4.8   < >  --  3.7 3.4* 3.6  CL 100   < >  --  100 98 96*  CO2 21*   < >  --  21* 25 30  GLUCOSE 113*   < >  --  98 121* 108*  BUN 74*   < >  --  51* 31* 23  CREATININE 3.21*   < >  --  2.95* 2.33* 2.27*  CALCIUM 8.7*   < >  --  9.3 9.0 9.3  PHOS 4.9*  --  3.3  --   --   --    < > = values in this interval not displayed.   Liver Function Tests: Recent Labs  Lab 10/11/20 0554 10/12/20 0222 10/13/20 0958  AST 77* 74* 65*  ALT 37 36 38  ALKPHOS 57 66 86  BILITOT 6.8* 5.3* 4.2*  PROT 6.6 6.7 6.6  ALBUMIN 3.6 3.7 3.7   Recent Labs  Lab 10/09/20 1055  LIPASE 68*   CBC: Recent Labs  Lab 10/09/20 1055 10/09/20 1412 10/10/20 0547 10/11/20 0554 10/11/20 1431 10/12/20 0222 10/13/20 0958  WBC  7.2   < > 6.9 5.3 6.0 5.9 6.4  NEUTROABS 6.2  --  5.5 4.1  --   --   --   HGB 10.1*   < > 9.1* 7.5* 8.5* 8.2* 9.1*  HCT 30.1*   < > 26.6* 23.1* 26.1* 24.3* 27.6*  MCV 106.4*   < > 106.0* 108.5* 108.8* 108.5* 108.7*  PLT 106*   < > 91* 63* 60* 65* 62*   < > = values in this interval not displayed.   Blood Culture    Component Value Date/Time   SDES FLUID ABDOMEN PERITONEAL 10/10/2020 1510   SDES FLUID PERITONEAL ABDOMEN 10/10/2020 1510   SPECREQUEST NONE 10/10/2020 1510   SPECREQUEST NONE 10/10/2020 1510   CULT  10/10/2020 1510    NO GROWTH 3 DAYS Performed at [facility], [place] [address], [place], [place] [address]    REPTSTATUS 10/11/2020 FINAL 10/10/2020 1510  REPTSTATUS PENDING 10/10/2020 1510   CBG: Recent Labs  Lab 10/09/20 0851  GLUCAP 110*   Iron Studies:  Recent Labs    10/10/20 1254  IRON 123  TIBC 125*  FERRITIN 943*   Lab Results  Component Value Date   INR 1.9 (H) 10/13/2020   INR 1.9 (H) 10/12/2020   INR 2.0 (H) 10/11/2020   Studies/Results: MR LIVER W WO CONTRAST  Result Date: 10/13/2020 CLINICAL DATA:  70 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 Staging examination. Acute hepatic encephalopathy. EXAM: MRI ABDOMEN WITHOUT AND WITH CONTRAST TECHNIQUE: Multiplanar multisequence MR imaging of the abdomen was performed both before and after the administration of intravenous contrast. CONTRAST:  35mL GADAVIST GADOBUTROL 1 MMOL/ML IV SOLN COMPARISON:  Abdominal MRI 07/04/2020. FINDINGS: Lower chest: Small hiatal hernia.  Mild cardiomegaly. Hepatobiliary: Liver has a shrunken appearance and nodular contour, indicative of advanced cirrhosis. Diffuse loss of signal intensity throughout the hepatic parenchyma on in phase dual echo images and diffuse low T2 signal intensity throughout the hepatic parenchyma, indicative of extensive iron deposition. Numerous T1 and T2 hypointense nodules scattered throughout the hepatic parenchyma, most of which are  subcentimeter in size and do not enhance. In addition, there are 2 larger lesions which are T1 hyperintense and generally T2 hypointense, without enhancement measuring 5.4 x 4.4 cm in segment 2 (axial image 29 of series 10/1) which is stable compared to the prior examination, and 5.8 x 3.9 cm predominantly in segments 5 and 6 (axial image 42 of series 10/1) which is increased in size compared to the prior study (previously 5.3 x 3.2 cm). Both of these lesions demonstrates some degree of diffusion restriction. In segment 3 of the liver there is a T1 hypointense nonenhancing area (axial image 38 of series 1038) which corresponds to a previously noted lesion, likely a indicative of post embolization scarring. Additional lesion in segment 8 of the liver (axial image 34 of series 10/1) measuring 1.3 cm in diameter, stable in retrospect compared to the prior study, also mildly T1 hyperintense, T2 hypointense without definite enhancement on post gadolinium imaging. Other previously noted smaller lesions are not confidently identified on today's examination. No intra or extrahepatic biliary ductal dilatation. Pancreas: Multiple tiny T1 hypointense, T2 hyperintense, nonenhancing lesions are noted throughout the pancreas, largest of which is in the tail of the pancreas measuring only 11 mm, similar in size and number to the prior examination, favored to represent small benign lesions such as pancreatic pseudocysts or side branch IPMNs (intraductal papillary mucinous neoplasms). No definite communication with the main pancreatic duct noted. No peripancreatic fluid collections. Spleen: Spleen is mildly enlarged measuring 11.1 x 6.9 x 10.9 cm (estimated splenic volume of 417 mL) . Adrenals/Urinary Tract: bilateral kidneys and adrenal glands are normal in appearance. No hydroureteronephrosis in the visualized portions of the abdomen. Stomach/Bowel: Visualized portions are unremarkable. Vascular/Lymphatic: Aortic atherosclerosis,  without evidence of aneurysm in the visualized abdominal vasculature. Portal vein is patent and normal in caliber. No lymphadenopathy noted in the abdomen. Other:  Moderate to large volume of ascites. Musculoskeletal: No aggressive appearing osseous lesions are noted in the visualized portions of the skeleton. IMPRESSION: 1. 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 Many of the previously noted suspicious lesions have regressed or disappeared following prior embolization procedure. The other lesions which remain are generally stable compared to the prior study, with the exception of the lesion in the right lobe of the liver between segments 5  and 6 which is slightly larger than the prior study, but demonstrates no definitive internal enhancement. Continued attention on follow-up studies is recommended to ensure stability or regression. 2. Moderate to large volume of ascites. 3. Multiple small cystic lesions scattered throughout the pancreas measuring 1.1 cm in size or less, stable compared to prior studies, likely to represent benign lesions as discussed above. Attention at time of routine follow-up is recommended to ensure continued stability. 4. Small hiatal hernia. 5. Mild cardiomegaly. Electronically Signed   By: Vinnie Langton M.D.   On: 10/13/2020 09:57    Medications: . sodium chloride    . sodium chloride    . amiodarone 30 mg/hr (10/13/20 1115)  . ceFEPime (MAXIPIME) IV 1 g (10/13/20 1130)   . (feeding supplement) PROSource Plus  30 mL Oral BID BM  . Chlorhexidine Gluconate Cloth  6 each Topical Q0600  . Chlorhexidine Gluconate Cloth  6 each Topical Q0600  . darbepoetin (ARANESP) injection - DIALYSIS  60 mcg Intravenous Q Sat-HD  . feeding supplement  1 Container Oral TID BM  . feeding supplement  237 mL Oral TID BM  . lactulose  20 g Oral TID  . midodrine  10 mg Oral TID  . multivitamin  1 tablet Oral QHS  . phytonadione  5 mg  Oral Once  . rifaximin  550 mg Oral BID    Dialysis Orders: TTS at [facility] -- brand new, only had 1 treatment there on 4/5, establishing orders 2.5hr today -> 3hr -> 3.5hr. EDW ~87kg, 2K/2.5Ca, TDC, no heparin  Assessment/Plan: 1.Sepsis/Bacteremia: BCx 4/6 growing GPC in 1 of 2 bottles. On Vanc/Cefepime.Repeat paracentesis with yield 5.8L on 4/7. Peritoneal fluid culture with NGTD. 2. Hepatic encephalopathy/decompensated liver failure: Hx cirrhosis/HCC s/p immunotherapy: Ammonia 190 on admit -> now 86 -> 79> 36, follow. S/p Vit K. On lactulose and rifaximin, per primary.Recurrent ascites with last paracentesis on 4/7, MRI 4/9 showed large volume ascites, plan for repeat para in next few days.  Also showed regression of previous suspicious lesions post embolization and stable known lesions except one in right lobe which is slightly larger.  Further recommendations per Onc/GI.  3. A-Fib RVR:S/p diltiazem drip d/c on 4/7. Reoccurred last night and started on amiodarone.  Cardio consulted and reccommended short acting PO dilt and no anticoagulation.  4. ESRD:New start -> titrating up dialysis Rx.  HD yesterday post MRI with contrast.  Additional HD not needed today as a class II Gadolinium based contrast (Gadavist) was used and has had 0 cases of NSF reported with its use in ESRD patients. Next HD on 4/12.  5.HTN/volume:BP low/stable, on midodrine 10mg  TID.Albumin prn for BP support.Does not appear volume overloaded.  Minimal UF with HD.  6. Anemiaof ESRD:Hgbdrop 7.5, back up to 9.1 today. tsat 99% with iron deposits noted in the liver, NO IRON.Aranesp 82mcg qSat.Transfuse prn. 7. Secondary hyperparathyroidism:Corrected Ca and phos in goal.  Not on binders or VDRA.  8. Nutrition:Alb 3.7.  Add protein supplement.   9. Dispo -  Patient DNR.  Jen Mow, PA-C [facility] 10/13/2020,11:33 AM  LOS: 4 days

## 2020-10-13 NOTE — Progress Notes (Signed)
At shift change patient in Afib RVR, obtained EKG to confirm this. Paged [facility], got orders to start iv amiodarone infusion. Patients vitals 110/70 HR 110s-120s, 95 % RA. Patient denies palpitations, chest pain, or shortness of breath. Will monitor closely.

## 2020-10-13 NOTE — Consult Note (Addendum)
Cardiology Consultation:   Patient ID: Dillon Perkins MRN: [id number]; DOB: [birth year]  Admit date: 10/09/2020 Date of Consult: 10/13/2020  PCP:  Antony Contras, MD   [place]  Cardiologist:  Minus Breeding, MD  Advanced Practice Provider:  No care team member to display Electrophysiologist:  None        Patient Profile:   Dillon Perkins is a 70 y.o. male with a hx of atrial fill non compliant with dilt, hepatocellular carcinoma s/p radio embolization, immunotherapy, alcoholic cirrhosis with portal HTN, with paracentesis prn, HTN, CKD 5 beginning dialysis.  who is being seen today for the evaluation of atrial fib at the request of Dr. Nevada Crane.  History of Present Illness:   [name] with above hx and admitted 10/09/20 with AMS, had been more lethargic, this increased on morning of admit.  His first dialysis was on the 5th of this month.    When pt saw Dr. Percival Spanish 09/06/20, he has not been able to afford DOAC,  He does not want coumadin.  CHA2DSvasc of 2-3.  He checks his pulse on pulse ox.  He is sensitive to meds and did not tolerate higher doses of dilt.  Dr. Percival Spanish was trying to use lower dosing immediate release dilt TID due to hypotension.  On that visit pt was in a fib with HR 127 no acute sT changes.  No ischemic work up.  Hx of echo 11/2018 EF 55-60%, RV normal, LA size moderately dilated, RA size mildly dilated, + aortic sclerosis without evidence of stenosis of aortic valve.    On arrival to ER in a fib RVR, BP soft and LR 500 given, ammonia 190, lactic acid 4.0, bilirubin 4.9,    AST 61, ALT 46  plts 106, hgb 10 INR 1.7 COVID neg.    Had paracentesis with removal of 5.8 L of yellow fluid 10/10/20.   Labs today Na 133, K+ 3.4 Cr 2.33 AST 74, ALT 36 ammonia yesterday was 36 total bili 5.3   Hgb 8.2, plts 65 WBC 5.9 INR 1.9 to 2.0   EKG:  The EKG was personally reviewed and demonstrates:  Reviewed all EKGS from 09/06/20 through today a fib with RVR  mostly rates 130s. No ST changes --looking back to 05/2021 was in a fib with controlled rate Telemetry:  Telemetry was personally reviewed and demonstrates:  A fib RVR. In 130s   BP 110/77 to 95/58  P 128 to 80s afebrile.   I&O +1640  Wt changes from 88.3 Kg on admit to 80.9 kg today.  Currently resting but wakes easily and answers questions approp  Past Medical History:  Diagnosis Date  . Alcoholic cirrhosis ([place])   . Atrial fibrillation ([place])   . GERD (gastroesophageal reflux disease)   . Hepatocellular carcinoma ([place])   . History of chemotherapy taking immunotherapy   last dose 05-13-2020  . History of kidney stones   . Hyperlipidemia   . Hypertension     Past Surgical History:  Procedure Laterality Date  . APPENDECTOMY  yrs ago  . CYSTOSCOPY/URETEROSCOPY/HOLMIUM LASER/STENT PLACEMENT Bilateral 05/22/2020   Procedure: CYSTOSCOPY/RETROGRADE/URETEROSCOPY/HOLMIUM LASER/STENT PLACEMENT STONE BASKET RETRIVAL ;  Surgeon: Ceasar Mons, MD;  [facility];  Service: Urology;  Laterality: Bilateral;  . IR ANGIOGRAM SELECTIVE EACH ADDITIONAL VESSEL  02/19/2020  . IR ANGIOGRAM SELECTIVE EACH ADDITIONAL VESSEL  02/19/2020  . IR ANGIOGRAM SELECTIVE EACH ADDITIONAL VESSEL  02/19/2020  . IR ANGIOGRAM SELECTIVE EACH ADDITIONAL VESSEL  02/19/2020  . IR ANGIOGRAM SELECTIVE EACH ADDITIONAL VESSEL  02/19/2020  . IR ANGIOGRAM SELECTIVE EACH ADDITIONAL VESSEL  02/19/2020  . IR ANGIOGRAM SELECTIVE EACH ADDITIONAL VESSEL  03/01/2020  . IR ANGIOGRAM SELECTIVE EACH ADDITIONAL VESSEL  03/01/2020  . IR ANGIOGRAM SELECTIVE EACH ADDITIONAL VESSEL  03/01/2020  . IR ANGIOGRAM VISCERAL SELECTIVE  02/19/2020  . IR ANGIOGRAM VISCERAL SELECTIVE  03/01/2020  . IR EMBO ARTERIAL NOT HEMORR HEMANG INC GUIDE ROADMAPPING  02/19/2020  . IR EMBO TUMOR ORGAN ISCHEMIA INFARCT INC GUIDE ROADMAPPING  02/19/2020  . IR EMBO TUMOR ORGAN ISCHEMIA INFARCT INC GUIDE ROADMAPPING  03/01/2020  . IR  PARACENTESIS  07/15/2020  . IR PARACENTESIS  08/02/2020  . IR PARACENTESIS  08/19/2020  . IR PARACENTESIS  09/09/2020  . IR PARACENTESIS  10/07/2020  . IR PARACENTESIS  10/10/2020  . IR RADIOLOGIST EVAL & MGMT  01/30/2020  . IR US GUIDE VASC ACCESS RIGHT  02/19/2020  . IR US GUIDE VASC ACCESS RIGHT  03/01/2020     Home Medications:  Prior to Admission medications   Medication Sig Start Date End Date Taking? Authorizing Provider  acetaminophen (TYLENOL) 500 MG tablet Take 1,000 mg by mouth every 6 (six) hours as needed for mild pain.   Yes [provider]  Coenzyme Q10 (COQ10) 100 MG CAPS Take 100 mg by mouth daily.    Yes [provider]  diltiazem (CARDIZEM) 30 MG tablet Take 1 tablet (30 mg total) by mouth 3 (three) times daily. 09/06/20  Yes Minus Breeding, MD  furosemide (LASIX) 40 MG tablet Take 40 mg by mouth 3 (three) times a week. As needed for fluid   Yes [provider]  hydrOXYzine (ATARAX/VISTARIL) 10 MG tablet Take 10 mg by mouth every 8 (eight) hours as needed for itching or anxiety.   Yes [provider]  LORazepam (ATIVAN) 0.5 MG tablet Take 0.5-1 tablets (0.25-0.5 mg total) by mouth at bedtime as needed for anxiety or sleep. 10/02/20  Yes Owens Shark, NP  midodrine (PROAMATINE) 10 MG tablet Take 10 mg by mouth 3 (three) times daily.   Yes [provider]  spironolactone (ALDACTONE) 50 MG tablet TAKE ONE TABLET BY MOUTH DAILY Patient taking differently: Take 50 mg by mouth daily. 09/06/20  Yes Ladene Artist, MD    Inpatient Medications: Scheduled Meds: . (feeding supplement) PROSource Plus  30 mL Oral BID BM  . Chlorhexidine Gluconate Cloth  6 each Topical Q0600  . Chlorhexidine Gluconate Cloth  6 each Topical Q0600  . darbepoetin (ARANESP) injection - DIALYSIS  60 mcg Intravenous Q Sat-HD  . feeding supplement  1 Container Oral TID BM  . feeding supplement  237 mL Oral TID BM  . lactulose  20 g Oral TID  . midodrine  10 mg Oral TID   . multivitamin  1 tablet Oral QHS  . phytonadione  5 mg Oral Once  . rifaximin  550 mg Oral BID   Continuous Infusions: . sodium chloride    . sodium chloride    . amiodarone 30 mg/hr (10/13/20 0259)  . ceFEPime (MAXIPIME) IV 1 g (10/12/20 1144)   PRN Meds: sodium chloride, sodium chloride, alteplase, haloperidol lactate, heparin, hydrOXYzine, lidocaine (PF), lidocaine-prilocaine, ondansetron **OR** ondansetron (ZOFRAN) IV, pentafluoroprop-tetrafluoroeth  Allergies:    Allergies  Allergen Reactions  . Losartan Potassium Other (See Comments)  . Other Other (See Comments)  . Xarelto [Rivaroxaban] Other (See Comments)    gross hematuria    Social History:  Social History   Socioeconomic History  . Marital status: Married    Spouse name: [name]  . Number of children: 3  . Years of education: Not on file  . Highest education level: Not on file  Occupational History  . Occupation: retired  Tobacco Use  . Smoking status: Former Smoker    Packs/day: 1.00    Years: 0.50    Pack years: 0.50    Types: Cigarettes    Quit date: 08/21/1972    Years since quitting: 48.1  . Smokeless tobacco: Never Used  Vaping Use  . Vaping Use: Never used  Substance and Sexual Activity  . Alcohol use: Not Currently    Alcohol/week: 2.0 standard drinks    Types: 2 Cans of beer per week  . Drug use: No  . Sexual activity: Not on file  Other Topics Concern  . Not on file  Social History Narrative   Lives with wife.  Retired.  Three sons.    Social Determinants of Health   Financial Resource Strain: Not on file  Food Insecurity: Not on file  Transportation Needs: Not on file  Physical Activity: Not on file  Stress: Not on file  Social Connections: Not on file  Intimate Partner Violence: Not on file    Family History:    Family History  Problem Relation Age of Onset  . Heart disease Father 82       Heart attack  . Heart disease Sister 5       Heart attack  . Breast cancer  Mother   . Colon cancer Neg Hx   . Stomach cancer Neg Hx   . Pancreatic cancer Neg Hx   . Rectal cancer Neg Hx   . Esophageal cancer Neg Hx      ROS:  Please see the history of present illness.  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     Physical Exam/Data:   Vitals:   10/12/20 1810 10/12/20 2014 10/13/20 0333 10/13/20 0338  BP: (!) 95/58 110/70 110/77   Pulse: 85 (!) 115 (!) 107   Resp: 15 14 18    Temp: 98.1 F (36.7 C) 98.4 F (36.9 C) 98.7 F (37.1 C)   TempSrc: Oral Oral Oral   SpO2: 98% 95% 99%   Weight:    80.9 kg  Height:        Intake/Output Summary (Last 24 hours) at 10/13/2020 0808 Last data filed at 10/12/2020 2000 Gross per 24 hour  Intake 237 ml  Output 1000 ml  Net -763 ml   Last 3 Weights 10/13/2020 10/12/2020 10/12/2020  Weight (lbs) 178 lb 4.8 oz 179 lb 10.8 oz 178 lb  Weight (kg) 80.876 kg 81.5 kg 80.74 kg     Body mass index is 27.11 kg/m.  General:  Well nourished, well developed, in no acute distress, clor with jaundice  HEENT: normal Lymph: no adenopathy Neck: no JVD Endocrine:  No thryomegaly Vascular: No carotid bruits; pedal pulses 1+ bilaterally   Cardiac:  irreg irreg; no murmur gallup rub or click Lungs:  Clear to mildly diminished to auscultation bilaterally, no wheezing,  rhonchi or rales  Abd: soft, nontender,+ hepatomegaly + ascites Ext: no edema Musculoskeletal:  No deformities, BUE and BLE strength normal and equal Skin: warm and dry  Neuro:  Alert and oriented X 3 MAE follows commands,  no focal abnormalities noted Psych:  Normal affect   Relevant CV Studies: Echo 11/10/2018 IMPRESSIONS    1. The left ventricle has normal systolic function, with an ejection  fraction of 55-60%. The cavity size was normal. There is moderately  increased left ventricular wall thickness. Left ventricular diastolic  Doppler parameters are indeterminate.  2. The right ventricle has normal systolic function. The cavity was  normal. There is no increase in right ventricular wall thickness.  3. Left atrial size was moderately dilated.  4. Right atrial size was mildly dilated.  5. Mild thickening of the mitral valve leaflet. Mild calcification of the  mitral valve leaflet. There is mild mitral annular calcification present.  6. The aortic valve is tricuspid. Moderate thickening of the aortic  valve. Sclerosis without any evidence of stenosis of the aortic valve.   FINDINGS  Left Ventricle: The left ventricle has normal systolic function, with an  ejection fraction of 55-60%. The cavity size was normal. There is  moderately increased left ventricular wall thickness. Left ventricular  diastolic Doppler parameters are  indeterminate.   Laboratory Data:  High Sensitivity Troponin:  No results for input(s): TROPONINIHS in the last 720 hours.   Chemistry Recent Labs  Lab 10/10/20 0547 10/11/20 0554 10/12/20 0222  NA 136 136 133*  K 4.6 3.7 3.4*  CL 104 100 98  CO2 21* 21* 25  GLUCOSE 107* 98 121*  BUN 78* 51* 31*  CREATININE 3.41* 2.95* 2.33*  CALCIUM 9.2 9.3 9.0  GFRNONAA 18* 22* 29*  ANIONGAP 11 15 10     Recent Labs  Lab 10/10/20 0547 10/11/20 0554 10/12/20 0222  PROT 6.7 6.6 6.7  ALBUMIN 2.8* 3.6 3.7  AST 85* 77* 74*  ALT 44 37 36  ALKPHOS 85 57 66  BILITOT 6.4* 6.8* 5.3*   Hematology Recent Labs  Lab 10/11/20 0554 10/11/20 1431 10/12/20 0222  WBC 5.3 6.0 5.9  RBC 2.13* 2.40* 2.24*  HGB 7.5* 8.5* 8.2*  HCT 23.1* 26.1* 24.3*  MCV 108.5* 108.8* 108.5*   MCH 35.2* 35.4* 36.6*  MCHC 32.5 32.6 33.7  RDW 17.7* 17.3* 17.2*  PLT 63* 60* 65*   BNPNo results for input(s): BNP, PROBNP in the last 168 hours.  DDimer No results for input(s): DDIMER in the last 168 hours.   Radiology/Studies:  CT Abdomen Pelvis Wo Contrast  Result Date: 10/09/2020 CLINICAL DATA:  Abdominal distension. 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 COMPARISON:  MRI from 07/04/2020 FINDINGS: Lower chest: Trace bilateral pleural effusions identified. Hepatobiliary: The liver appears cirrhotic. Treated tumor within segment 2 is measures 5.3 x 4.3 cm on today's exam, image 17/3. This is compared with 5.0 x 4.6 cm previously. The other known liver lesions are difficult to assess reflecting lack of IV contrast material. Sludge and tiny stones identified within the dependent portion of the gallbladder. Pancreas: Unremarkable. No pancreatic ductal dilatation or surrounding inflammatory changes. Spleen: Normal in size without focal abnormality. Adrenals/Urinary Tract: Normal appearance of the adrenal glands. Small bilateral renal calculi identified. No hydronephrosis. Urinary bladder is unremarkable. Stomach/Bowel: The stomach appears nondistended. Small bowel  loops are unremarkable. Mild diffuse colonic wall thickening is nonspecific in the setting of ascites and portal venous hypertension. Vascular/Lymphatic: Aortic atherosclerosis. No aneurysm. Previous GDA embolization. Esophageal and upper abdominal varices. No abdominopelvic adenopathy. Reproductive: Prostate is unremarkable. Other: Large volume of ascites within the abdomen and pelvis. A left inguinal hernia is identified which is filled with ascites as well as a nonobstructed loop of sigmoid colon. No discrete fluid collections  identified. Musculoskeletal: Bilateral L5 pars defects identified. Mild anterolisthesis of L5 on S1 noted. IMPRESSION: 1. Large volume of ascites identified within the abdomen and pelvis. 2. Cirrhosis with known multifocal hepatocellular carcinoma. Status post Y 90 microsphere ablation. Dominant lesion in segment 2 appears similar to 07/04/2020 3.  Aortic Atherosclerosis (ICD10-I70.0). 4. Gallstones. 5. Trace pleural effusions. Electronically Signed   By: Kerby Moors M.D.   On: 10/09/2020 13:49   CT Head Wo Contrast  Result Date: 10/09/2020 CLINICAL DATA:  Neurological deficit EXAM: CT HEAD WITHOUT CONTRAST TECHNIQUE: Contiguous axial images were obtained from the base of the skull through the vertex without intravenous contrast. COMPARISON:  Prior head CT 05/29/2020 FINDINGS: Brain: No evidence of acute infarction, hemorrhage, hydrocephalus, extra-axial collection or mass lesion/mass effect. Stable scattered foci of white matter hypoattenuation consistent with chronic microvascular ischemic white matter disease. Tiny bilateral cerebellar hemisphere lacunar infarcts are also unchanged. Mild cortical atrophy without interval progression. Vascular: No hyperdense vessel or unexpected calcification. Atherosclerotic vascular calcifications in the bilateral cavernous and supraclinoid carotid arteries. Skull: Normal. Negative for fracture or focal lesion. Sinuses/Orbits: No acute finding. Other: None. IMPRESSION: 1. No acute intracranial abnormality. 2. Stable cortical atrophy, chronic microvascular ischemic white matter disease and bilateral cerebellar hemisphere lacunar infarcts. Electronically Signed   By: Jacqulynn Cadet M.D.   On: 10/09/2020 11:00   DG Chest Port 1 View  Result Date: 10/09/2020 CLINICAL DATA:  Altered mental status. History of hepatocellular carcinoma EXAM: PORTABLE CHEST 1 VIEW COMPARISON:  May 22, 2020. FINDINGS: Note that a small amount of the lateral left base not included. Central  catheter tip is in the right atrium. No pneumothorax. There is no edema or airspace opacity. Heart is mildly enlarged with pulmonary vascularity normal. No adenopathy. There is aortic atherosclerosis. No bone lesions. A focus of calcification is noted in the left carotid artery. IMPRESSION: Note that a small portion of the lateral left base not included. Visualized lungs clear. Stable cardiac prominence. Central catheter tip in right atrium without pneumothorax. Aortic Atherosclerosis (ICD10-I70.0). Focus of calcification noted in left carotid artery. Electronically Signed   By: Lowella Grip III M.D.   On: 10/09/2020 09:36   US LIVER DOPPLER  Result Date: 10/10/2020 CLINICAL DATA:  Cirrhosis of liver with ascites EXAM: DUPLEX ULTRASOUND OF LIVER TECHNIQUE: Color and duplex Doppler ultrasound was performed to evaluate the hepatic in-flow and out-flow vessels. COMPARISON:  CT abdomen/pelvis yesterday FINDINGS: Liver: Diffusely nodular liver contour. The liver appears small in the parenchyma is diffusely heterogeneous and echogenic. Hypoechoic soft tissue mass in the left upper lobe measures 4.8 x 4.4 x 5.0 cm. Hypoechoic solid mass in the right hepatic lobe measures approximately 4.9 x 5.7 x 8.3 cm. Diffusely thickened gallbladder wall. Sludge is present within the gallbladder lumen. The common bile duct is normal at 3 mm. Main Portal Vein size: 1.2 cm Portal Vein Velocities Main Prox:  41 cm/sec with hepatopetal directional flow. Main Mid: 55 cm/sec with hepatopetal directional flow. Main Dist:  71 cm/sec with hepatopetal directional flow. Right: Unable to visualize Left:  28 cm/sec with hepatopetal directional flow. Hepatic Vein Velocities Right:  27 cm/sec Middle:  32 cm/sec Left:  68 cm/sec IVC: Present and patent with normal respiratory phasicity. Hepatic Artery Velocity:  109 cm/sec Splenic Vein Velocity:  34 cm/sec Spleen: 8.4 cm x 12.0 cm x 8.2 cm with a total volume of 436 cm^3 (411 cm^3 is upper limit  normal) Portal Vein Occlusion/Thrombus: No Splenic Vein Occlusion/Thrombus: No Ascites: None Varices: Moderate ascites. IMPRESSION: 1. 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 2. Portal veins remain patent with hepatopetal directional flow. 3. Previously treated left hepatic mass measures approximately 4.8 x 4.4 x 5.0 cm. Viability of the lesion cannot be assessed with ultrasound. 4. Right hepatic mass measuring approximately 4.9 x 5.7 x 8.3 cm not identified on prior imaging. Recommend further evaluation with gadolinium-enhanced MRI of the abdomen when the patient's clinical status is improved, preferably as an outpatient to facilitate optimal imaging quality and diagnostic sensitivity. Electronically Signed   By: Jacqulynn Cadet M.D.   On: 10/10/2020 07:45   IR Paracentesis  Result Date: 10/10/2020 INDICATION: History of multifocal hepatocellular cancer. Recurrent ascites. Request for diagnostic and therapeutic paracentesis. EXAM: ULTRASOUND GUIDED LEFT LOWER QUADRANT PARACENTESIS MEDICATIONS: 1% plain lidocaine, 5 mL COMPLICATIONS: None immediate. PROCEDURE: Informed written consent was obtained from the patient after a discussion of the risks, benefits and alternatives to treatment. A timeout was performed prior to the initiation of the procedure. Initial ultrasound scanning demonstrates a large amount of ascites within the left lower abdominal quadrant. The left lower abdomen was prepped and draped in the usual sterile fashion. 1% lidocaine was used for local anesthesia. Following this, a 19 gauge, 7-cm, Yueh catheter was introduced. An ultrasound image was saved for documentation purposes. The paracentesis was performed. The catheter was removed and a dressing was applied. The patient tolerated the procedure well without immediate post procedural complication. FINDINGS: A total of approximately 5.8 L of clear yellow fluid was removed. Samples were sent to the  laboratory as requested by the clinical team. IMPRESSION: Successful ultrasound-guided paracentesis yielding 5.8 liters of peritoneal fluid. Read by: Ascencion Dike PA-C Electronically Signed   By: Sandi Mariscal M.D.   On: 10/10/2020 15:40     Assessment and Plan:   1. Atrial fib with rapid VR.  Pt has been in a fib since at least 05/2020. He could not take dilt at high doses due to hypotension.  Was just started on dilt 30 mg TID.  He is on midodrine for hypotension as well.  Has just started dialysis.  Currently on amiodarone but with his liver issues would not recommend long term. IV dilt was used in ER  May need to go back to po dilt for rate control a fib  Difficulty with meds,   Dig problematic with renal failure. Po dilt short acting is best choice for this pt.   2. Anticoagulation pt has not been on anticoagulation due to inability to afford DOAC and he refuses coumadin, though now some auto anticoagulation due to liver disease which would be high risk for anticoagulation.   3. Resolved acute hepatic encephalopathy with ammonia of 190, likely secondary to medication noncompliance with lactulose.  , giving IV albumin for intravascular volume depletion to maintain MAP back on lacutlose per IM 4. ESRD on dialysis per nephrology 5.  decompensated alcoholic cirrhosis with [place] per IM.  MRI of liver pending.    6. Chronic hypotension on midodrine 7. Pt is DNR and  palliative team has been consulted.    Risk Assessment/Risk Scores:          CHA2DS2-VASc Score = 4  This indicates a 4.8% annual risk of stroke. The patient's score is based upon: CHF History: No HTN History: Yes Diabetes History: No Stroke History: Yes Vascular Disease History: No Age Score: 1 Gender Score: 0  I see no hx of CVA in history        For questions or updates, please contact [place] Please consult [email or web address] for contact info under    Signed, Cecilie Kicks, NP  10/13/2020 8:08 AM   I have seen,  examined the patient, and reviewed the above assessment and plan.  Changes to above are made where necessary.  On exam, chronically ill,  iRRR.   Given advanced renal and liver disease, he is not a candidate for [facility] or  AADs. I agree with Dr Cherlyn Cushing approach of diltiazem.  V rates are reasonably controlled.  I would advise that we stop amiodarone.  He has very few cardiac symptoms.  I do not feel that additional or more aggressive management is currently going to be beneficial.  His prognosis is poor.  Palliative care consultation is prudent.    Co Sign: Thompson Grayer, MD 10/13/2020 10:35 AM

## 2020-10-13 NOTE — Progress Notes (Signed)
Palliative Medicine Inpatient Follow Up Note  Reason for consult:  Discuss goals of care  HPI:  Per intake H&P --> Dillon Perkins a 70 y.o.malewith medical history significant ofhepatocellular carcinoma status post radio embolization, immunotherapywith Nivolumabx10 cycles last cycle was September 03, 3265, alcoholic cirrhosis with portal hypertension, PAF noncompliant with Cardizem, CKD stage V starting dialysis, presented with altered mentation.  Palliative care was asked to get involved in the setting of suspected advancing multifocal hepatocellular carcinoma to further address goals of care.  Today's Discussion (10/13/2020):  *Please note that this is a verbal dictation therefore any spelling or grammatical errors are due to the "[place] One" system interpretation.  Chart reviewed.  I met with Dillon Perkins at bedside this morning.  He shares with me that yesterday was "a busy day".  He goes on to tell me how troubling it was to go to MRI and then dialysis well dealing with incidence of diarrhea.  He goes on to tell me that he realizes the medication he is on is important to help his liver disease but that it is very difficult to continue to move your bowels throughout the day and at times be unable to control them.  We reviewed the medications which Dillon Perkins is on which ideally would be tapered to affect.  We reviewed that there is a careful balance which much be sought with these medications in terms of patients who have end-stage liver disease because ideally we would want for Dillon Perkins to remain as clear minded as possible for as long as possible.  He shares that he understands this.  Dillon Perkins goes on to tell me that some providers had met with him yesterday and told him that his best chance of survival is a liver transplant which he does not know if he is even a candidate for.  He and his wife both plan on getting second opinions from other institutions to help identify what the next steps would  be.  Dillon Perkins and I talked for quite some time about the importance of giving back to your community whether giving is bigger or small.  He considers this to be an essential goal of his if his condition continues to improve.  He expresses wanting to give back through the care of animals.  I told Dillon Perkins that this is a wonderful focus of his and certainly something that is admirable.  Today, we talked about focusing on small goals such as mobilizing more so around the unit.  We further reviewed Dillon Perkins course of illness and the rapidity in which it has taken place.  We reviewed all the things in his life which have changed secondary to acute on chronic illnesses.  I offered support through therapeutic listening.  I shared with Dillon Perkins that we will certainly have someone follow-up with him once he leaves the hospital to verify his symptoms are appropriately managed and if his condition should decline that he has the resources he is going to need to further discuss the best next steps.  Questions and concerns addressed   Objective Assessment: Vital Signs Vitals:   10/12/20 2014 10/13/20 0333  BP: 110/70 110/77  Pulse: (!) 115 (!) 107  Resp: 14 18  Temp: 98.4 F (36.9 C) 98.7 F (37.1 C)  SpO2: 95% 99%    Intake/Output Summary (Last 24 hours) at 10/13/2020 0959 Last data filed at 10/12/2020 2000 Gross per 24 hour  Intake 237 ml  Output 1000 ml  Net -763 ml   Last  Weight  Most recent update: 10/13/2020  3:38 AM   Weight  80.9 kg (178 lb 4.8 oz)           Gen:  Elderly M  HEENT: moist mucous membranes CV: Regular rate and rhythm  PULM: clear to auscultation bilaterally  ABD: Distended EXT: No edema  Neuro: Alert and oriented x3   SUMMARY OF RECOMMENDATIONS DNAR/DNI  MOST Completed, paper copy placed onto the chart electric copy can be found in [employer]yn[employer]  DNR Form Completed, paper copy placed onto the chart electric copy can be found in [employer]  TOC - OP Palliative care consult patient's  wife has requested speaking to them prior to discharge  [facility] providing direct updates to patients wife, Dillon Perkins  Ongoing incremental palliative care support  Time Spent: 35 Greater than 50% of the time was spent in counseling and coordination of care ______________________________________________________________________________________ [place] Team Team Cell Phone: [phone] Please utilize secure chat with additional questions, if there is no response within 30 minutes please call the above phone number  Palliative Medicine Team providers are available by phone from 7am to 7pm daily and can be reached through the team cell phone.  Should this patient require assistance outside of these hours, please call the patient's attending physician.

## 2020-10-13 NOTE — Progress Notes (Signed)
PROGRESS NOTE  Dillon Perkins FXT:[id number] DOB: 09-12-1950 DOA: 10/09/2020 PCP: Dillon Contras, MD  HPI/Recap of past 24 hours: Dillon Perkins is a 70 y.o. male with medical history significant of hepatocellular carcinoma status post radio embolization, immunotherapy with Nivolumab x10 cycles last cycle was September 04, 2990, alcoholic cirrhosis with portal hypertension, PAF, CKD stage V new ESRD on HD TTS, chronic hypotension on Midodrine who presented with acute hepatic encephalopathy with ammonia level of 190.  Due to concern for active infective process he was started on IV antibiotics empirically cefepime and IV vancomycin.  In the ED he was in A. fib RVR and was started on Cardizem drip.  CT head was negative for any acute intracranial findings.  Patient was started on lactulose enema with improvement of his ammonia level and of his mentation.  Status post paracentesis with 5.8L of fluid removed, peritoneal fluid sent for analysis.  No evidence of SBP.  He also received IV albumin for intravascular volume repletion, and IV vitamin K 5 mg daily x3 doses due to coagulopathy with INR of 1.9.  MRI with and without contrast done on 10/12/2020 revealed large volume ascites, regression of previous suspicious lesions postembolization and stable no lesions except for 1 in right lobe which is slightly larger.  10/13/20: Patient was seen and examined with his wife at his bedside.  A. fib with RVR reported overnight.  Was started on amiodarone drip.  Reports he feels better today.  Denies any abdominal pain or nausea.  Frequent stools reported on lactulose.  Mentation is at baseline.    Assessment/Plan: Active Problems:   Atrial fibrillation with RVR (HCC)   Sepsis (HCC)   Delirium   Malnutrition of moderate degree   Acute metabolic encephalopathy   Alcoholic cirrhosis of liver with ascites (HCC)  Resolved acute hepatic encephalopathy likely secondary to medication noncompliance with  lactulose. Presented with confusion and elevated ammonia level, 190. Had stopped taking his lactulose and took Imodium instead to slow down his bowel movements. Restarted lactulose on presentation with improvement of ammonia level and symptomatology. Was also started on IV antibiotics empirically, IV vancomycin and cefepime. Mentation is currently back to baseline. No evidence of SBP on peritoneal fluid culture post paracentesis. Received IV albumin for intravascular volume depletion to maintain MAP greater than 65. Continue lactulose with goal of 3-4 bowel movements per day as recommended by GI.  Paroxysmal A. fib with RVR Initially on Cardizem drip, stopped on 10/10/2020 A. fib with RVR overnight was started on amiodarone drip. Seen by cardiology, recommended to stop amiodarone and to restart short acting diltiazem for rate control. Continue to monitor on telemetry.  Decompensated alcoholic cirrhosis with Cowan Meld score of 34 on 10/12/2020. Patient's wife is interested in following up with hepatology and considering the possibility of liver transplant. Continue lactulose and rifaximin. No evidence of SBP. Continue 2 g sodium restricted diet. Goal 3-4 soft stools per day while on lactulose, titrate to achieve that goal. MRI with and without contrast done on 10/12/2020 revealed large volume ascites, regression of previous suspicious lesions postembolization and stable no lesions except for 1 in right lobe which is slightly larger. Appreciate GI and medical oncology's assistance.  Large volume ascites in the setting of decompensated alcoholic liver cirrhosis Last paracentesis was on 10/10/2020 with 5.8 L peritoneal fluid removed. Will likely have another paracentesis prior to discharge.  New ESRD on HD TTS Hepatorenal? Management per nephrology Next HD on 10/15/2020  Coagulopathy in the setting  of [place] Has received IV vitamin K INR 1.9 from 2.0 from 1.7. Repeat INR in the  morning  Gram-positive cocci in cluster positive blood culture 1 out of 2 bottles Blood culture drawn on 10/09/2020 + for gram positive cocci in cluster 1 out of 2 bottles, aerobic bottle only.  Possibly contaminant. Currently on cefepime. Has received a dose of IV vancomycin  History of staph hominis bacterial peritonitis on 08/02/20 With resistance to oxacillin Continue to follow peritoneal and blood cultures.  Improving hyperbilirubinemia with jaundice T bili is downtrending 4.2 from 5.3 from 6.8 from 6.4. AST to ALT 2:1 ratio Continue to monitor  Resolved hyperammonemia in the setting of decompensated alcoholic cirrhosis Presented with ammonia level 190 Ammonia level 36 on 10/11/2020. Continue lactulose and rifaximin  Resolved mild anion gap metabolic acidosis Serum bicarb 30 from 25 from 21 Electrolytes addressed with hemodialysis Appreciate nephrology's assistance.  Hyponatremia, likely dilutional Serum sodium 133 Addressed with hemodialysis Repeat renal panel in the morning.  Chronic hypotension on midodrine Continue midodrine Continue IV albumin for now. Maintain MAP greater than 65.  Anemia of chronic disease  Hemoglobin uptrending 9.1 from 8.2. No overt bleeding.  Chronic thrombocytopenia in the setting of cirrhosis Platelet count 62 from 65 No overt bleeding.  Physical debility PT assessment recommended home health PT Continue fall precautions  Goals of care Palliative care team consult to assist with establishing goals of care. Currently the patient is DNR.  Code Status: DNR.  Family Communication: Updated his wife at bedside on 10/13/2020.  Disposition Plan: Likely will discharge to home with home health services once GI and medical oncology sign off.   Consultants:  GI.  Nephrology.  IR for diagnostic paracentesis  Palliative care team.  Procedures:  HD on 10/10/2020, on 10/11/2020, on 10/12/2020.  Antimicrobials:  IV vancomycin  IV  cefepime.  DVT prophylaxis: SCDs.  Status is: Inpatient   Dispo:  Patient From: Home  Planned Disposition: Home with Health Care [facility] when hemodynamically stable and GI medical oncology have signed off.  Medically stable for discharge: No, ongoing management of decompensated cirrhosis.          Objective: Vitals:   10/13/20 0333 10/13/20 0338 10/13/20 1107 10/13/20 1318  BP: 110/77  117/82 111/77  Pulse: (!) 107     Resp: 18  18   Temp: 98.7 F (37.1 C)   97.9 F (36.6 C)  TempSrc: Oral   Oral  SpO2: 99%     Weight:  80.9 kg    Height:        Intake/Output Summary (Last 24 hours) at 10/13/2020 1537 Last data filed at 10/12/2020 2000 Gross per 24 hour  Intake 237 ml  Output 1000 ml  Net -763 ml   Filed Weights   10/12/20 0432 10/12/20 1500 10/13/20 0338  Weight: 80.7 kg 81.5 kg 80.9 kg    Exam:  . General: 70 y.o. year-old male chronically ill-appearing no acute distress.  He is alert and oriented x3.   . Cardiovascular: Irregular rate and rhythm no rubs or gallops. Marland Kitchen Respiratory: Clear to auscultation no wheezes or rales. . Abdomen: Distended bowel sounds present.  Nontender. . Musculoskeletal: No lower extremity edema bilaterally. . Skin: No ulcerative lesions noted.   Marland Kitchen Psychiatry: Mood is appropriate for condition and setting.   Data Reviewed: CBC: Recent Labs  Lab 10/09/20 1055 10/09/20 1412 10/10/20 0547 10/11/20 0554 10/11/20 1431 10/12/20 0222 10/13/20 0958  WBC 7.2   < > 6.9 5.3 6.0  5.9 6.4  NEUTROABS 6.2  --  5.5 4.1  --   --   --   HGB 10.1*   < > 9.1* 7.5* 8.5* 8.2* 9.1*  HCT 30.1*   < > 26.6* 23.1* 26.1* 24.3* 27.6*  MCV 106.4*   < > 106.0* 108.5* 108.8* 108.5* 108.7*  PLT 106*   < > 91* 63* 60* 65* 62*   < > = values in this interval not displayed.   Basic Metabolic Panel: Recent Labs  Lab 10/09/20 2023 10/10/20 0547 10/10/20 1254 10/11/20 0554 10/12/20 0222 10/13/20 0958  NA 133* 136  --  136 133* 133*  K 4.8 4.6  --  3.7  3.4* 3.6  CL 100 104  --  100 98 96*  CO2 21* 21*  --  21* 25 30  GLUCOSE 113* 107*  --  98 121* 108*  BUN 74* 78*  --  51* 31* 23  CREATININE 3.21* 3.41*  --  2.95* 2.33* 2.27*  CALCIUM 8.7* 9.2  --  9.3 9.0 9.3  PHOS 4.9*  --  3.3  --   --   --    GFR: Estimated Creatinine Clearance: 28.9 mL/min (A) (by C-G formula based on SCr of 2.27 mg/dL (H)). Liver Function Tests: Recent Labs  Lab 10/09/20 1055 10/09/20 2023 10/10/20 0547 10/11/20 0554 10/12/20 0222 10/13/20 0958  AST 61*  --  85* 77* 74* 65*  ALT 46*  --  44 37 36 38  ALKPHOS 106  --  85 57 66 86  BILITOT 4.9*  --  6.4* 6.8* 5.3* 4.2*  PROT 7.2  --  6.7 6.6 6.7 6.6  ALBUMIN 2.2* 2.1* 2.8* 3.6 3.7 3.7   Recent Labs  Lab 10/09/20 1055  LIPASE 68*   Recent Labs  Lab 10/09/20 1055 10/09/20 2023 10/10/20 0547 10/11/20 0554  AMMONIA 190* 86* 79* 36*   Coagulation Profile: Recent Labs  Lab 10/09/20 1042 10/10/20 0547 10/11/20 0554 10/12/20 0222 10/13/20 0958  INR 1.7* 1.9* 2.0* 1.9* 1.9*   Cardiac Enzymes: No results for input(s): CKTOTAL, CKMB, CKMBINDEX, TROPONINI in the last 168 hours. BNP (last 3 results) No results for input(s): PROBNP in the last 8760 hours. HbA1C: No results for input(s): HGBA1C in the last 72 hours. CBG: Recent Labs  Lab 10/09/20 0851  GLUCAP 110*   Lipid Profile: No results for input(s): CHOL, HDL, LDLCALC, TRIG, CHOLHDL, LDLDIRECT in the last 72 hours. Thyroid Function Tests: No results for input(s): TSH, T4TOTAL, FREET4, T3FREE, THYROIDAB in the last 72 hours. Anemia Panel: No results for input(s): VITAMINB12, FOLATE, FERRITIN, TIBC, IRON, RETICCTPCT in the last 72 hours. Urine analysis:    Component Value Date/Time   COLORURINE AMBER (A) 10/09/2020 1153   APPEARANCEUR CLEAR 10/09/2020 1153   LABSPEC 1.016 10/09/2020 1153   PHURINE 5.0 10/09/2020 1153   GLUCOSEU NEGATIVE 10/09/2020 1153   HGBUR NEGATIVE 10/09/2020 1153   BILIRUBINUR NEGATIVE 10/09/2020 1153    KETONESUR NEGATIVE 10/09/2020 1153   PROTEINUR NEGATIVE 10/09/2020 1153   UROBILINOGEN 1.0 07/23/2014 0402   NITRITE NEGATIVE 10/09/2020 1153   LEUKOCYTESUR SMALL (A) 10/09/2020 1153   Sepsis Labs: @LABRCNTIP (procalcitonin:4,lacticidven:4)  ) Recent Results (from the past 240 hour(s))  Blood Culture (routine x 2)     Status: Abnormal   Collection Time: 10/09/20  9:44 AM   Specimen: BLOOD  Result Value Ref Range Status   Specimen Description BLOOD RIGHT ANTECUBITAL  Final   Special Requests   Final    BOTTLES  DRAWN AEROBIC AND ANAEROBIC Blood Culture results may not be optimal due to an inadequate volume of blood received in culture bottles   Culture  Setup Time   Final    GRAM POSITIVE COCCI IN CLUSTERS AEROBIC BOTTLE ONLY Organism ID to follow CRITICAL RESULT CALLED TO, READ BACK BY AND VERIFIED WITH: S GRUBER PHARMD [id number] 10/10/20 A BROWNING    Culture (A)  Final    STAPHYLOCOCCUS CAPITIS THE SIGNIFICANCE OF ISOLATING THIS ORGANISM FROM A SINGLE VENIPUNCTURE CANNOT BE PREDICTED WITHOUT FURTHER CLINICAL AND CULTURE CORRELATION. SUSCEPTIBILITIES AVAILABLE ONLY ON REQUEST. Performed at [facility], [place] [address], [place], [place] [address]    Report Status 10/12/2020 FINAL  Final  Blood Culture ID Panel (Reflexed)     Status: Abnormal   Collection Time: 10/09/20  9:44 AM  Result Value Ref Range Status   Enterococcus faecalis NOT DETECTED NOT DETECTED Final   Enterococcus Faecium NOT DETECTED NOT DETECTED Final   Listeria monocytogenes NOT DETECTED NOT DETECTED Final   Staphylococcus species DETECTED (A) NOT DETECTED Final    Comment: CRITICAL RESULT CALLED TO, READ BACK BY AND VERIFIED WITH: Marisue Ivan PHARMD 1301 10/10/20 A BROWNING    Staphylococcus aureus (BCID) NOT DETECTED NOT DETECTED Final   Staphylococcus epidermidis NOT DETECTED NOT DETECTED Final   Staphylococcus lugdunensis NOT DETECTED NOT DETECTED Final   Streptococcus species NOT DETECTED NOT DETECTED Final    Streptococcus agalactiae NOT DETECTED NOT DETECTED Final   Streptococcus pneumoniae NOT DETECTED NOT DETECTED Final   Streptococcus pyogenes NOT DETECTED NOT DETECTED Final   A.calcoaceticus-baumannii NOT DETECTED NOT DETECTED Final   Bacteroides fragilis NOT DETECTED NOT DETECTED Final   Enterobacterales NOT DETECTED NOT DETECTED Final   Enterobacter cloacae complex NOT DETECTED NOT DETECTED Final   Escherichia coli NOT DETECTED NOT DETECTED Final   Klebsiella aerogenes NOT DETECTED NOT DETECTED Final   Klebsiella oxytoca NOT DETECTED NOT DETECTED Final   Klebsiella pneumoniae NOT DETECTED NOT DETECTED Final   Proteus species NOT DETECTED NOT DETECTED Final   Salmonella species NOT DETECTED NOT DETECTED Final   Serratia marcescens NOT DETECTED NOT DETECTED Final   Haemophilus influenzae NOT DETECTED NOT DETECTED Final   Neisseria meningitidis NOT DETECTED NOT DETECTED Final   Pseudomonas aeruginosa NOT DETECTED NOT DETECTED Final   Stenotrophomonas maltophilia NOT DETECTED NOT DETECTED Final   Candida albicans NOT DETECTED NOT DETECTED Final   Candida auris NOT DETECTED NOT DETECTED Final   Candida glabrata NOT DETECTED NOT DETECTED Final   Candida krusei NOT DETECTED NOT DETECTED Final   Candida parapsilosis NOT DETECTED NOT DETECTED Final   Candida tropicalis NOT DETECTED NOT DETECTED Final   Cryptococcus neoformans/gattii NOT DETECTED NOT DETECTED Final    Comment: Performed at [facility], [address], [place], [place] [address]  Blood Culture (routine x 2)     Status: None (Preliminary result)   Collection Time: 10/09/20  9:51 AM   Specimen: BLOOD RIGHT HAND  Result Value Ref Range Status   Specimen Description BLOOD RIGHT HAND  Final   Special Requests   Final    BOTTLES DRAWN AEROBIC AND ANAEROBIC Blood Culture adequate volume   Culture   Final    NO GROWTH 4 DAYS Performed at [facility], [place] [address] [place],  [address]    Report Status  PENDING  Incomplete  Resp Panel by RT-PCR (Flu A&B, Covid) Nasopharyngeal Swab     Status: None   Collection  Time: 10/09/20  9:51 AM   Specimen: Nasopharyngeal Swab; Nasopharyngeal(NP) swabs in vial transport medium  Result Value Ref Range Status   SARS Coronavirus 2 by RT PCR NEGATIVE NEGATIVE Final    Comment: (NOTE) SARS-CoV-2 target nucleic acids are NOT DETECTED.  The SARS-CoV-2 RNA is generally detectable in upper respiratory specimens during the acute phase of infection. The lowest concentration of SARS-CoV-2 viral copies this assay can detect is 138 copies/mL. A negative result does not preclude SARS-Cov-2 infection and should not be used as the sole basis for treatment or other patient management decisions. A negative result may occur with  improper specimen collection/handling, submission of specimen other than nasopharyngeal swab, presence of viral mutation(s) within the areas targeted by this assay, and inadequate number of viral copies(<138 copies/mL). A negative result must be combined with clinical observations, patient history, and epidemiological information. The expected result is Negative.  Fact Sheet for Patients:  EntrepreneurPulse.com.au  Fact Sheet for Healthcare Providers:  IncredibleEmployment.be  This test is no t yet approved or cleared by the [place] FDA and  has been authorized for detection and/or diagnosis of SARS-CoV-2 by FDA under an Emergency Use Authorization (EUA). This EUA will remain  in effect (meaning this test can be used) for the duration of the COVID-19 declaration under Section 564(b)(1) of the Act, 21 U.S.C.section 360bbb-3(b)(1), unless the authorization is terminated  or revoked sooner.       Influenza A by PCR NEGATIVE NEGATIVE Final   Influenza B by PCR NEGATIVE NEGATIVE Final    Comment: (NOTE) The Xpert Xpress SARS-CoV-2/FLU/RSV plus assay is intended as an aid in the diagnosis of  influenza from Nasopharyngeal swab specimens and should not be used as a sole basis for treatment. Nasal washings and aspirates are unacceptable for Xpert Xpress SARS-CoV-2/FLU/RSV testing.  Fact Sheet for Patients: EntrepreneurPulse.com.au  Fact Sheet for Healthcare Providers: IncredibleEmployment.be  This test is not yet approved or cleared by the [place] FDA and has been authorized for detection and/or diagnosis of SARS-CoV-2 by FDA under an Emergency Use Authorization (EUA). This EUA will remain in effect (meaning this test can be used) for the duration of the COVID-19 declaration under Section 564(b)(1) of the Act, 21 U.S.C. section 360bbb-3(b)(1), unless the authorization is terminated or revoked.  Performed at [facility], [place] [address], [place], [place] [address]   Urine culture     Status: None   Collection Time: 10/09/20 11:53 AM   Specimen: In/Out Cath Urine  Result Value Ref Range Status   Specimen Description IN/OUT CATH URINE  Final   Special Requests NONE  Final   Culture   Final    NO GROWTH Performed at [facility], [place] [address], [place], [place] [address]    Report Status 10/10/2020 FINAL  Final  MRSA PCR Screening     Status: None   Collection Time: 10/10/20  1:14 PM   Specimen: Nasal Mucosa; Nasopharyngeal  Result Value Ref Range Status   MRSA by PCR NEGATIVE NEGATIVE Final    Comment:        The GeneXpert MRSA Assay (FDA approved for NASAL specimens only), is one component of a comprehensive MRSA colonization surveillance program. It is not intended to diagnose MRSA infection nor to guide or monitor treatment for MRSA infections. Performed at [facility], [place] [address] [place],  [address]   Gram stain     Status: None   Collection Time: 10/10/20  3:10  PM   Specimen: Abdomen; Peritoneal Fluid  Result Value Ref Range Status   Specimen Description FLUID ABDOMEN PERITONEAL   Final   Special Requests NONE  Final   Gram Stain   Final    WBC PRESENT, PREDOMINANTLY PMN NO ORGANISMS SEEN CYTOSPIN SMEAR Performed at [facility], [place] [address], [place], [place] [address]    Report Status 10/11/2020 FINAL  Final  Culture, body fluid w Gram Stain-bottle     Status: None (Preliminary result)   Collection Time: 10/10/20  3:10 PM   Specimen: Fluid  Result Value Ref Range Status   Specimen Description FLUID PERITONEAL ABDOMEN  Final   Special Requests NONE  Final   Culture   Final    NO GROWTH 3 DAYS Performed at [place] [address], [place], [place] [address]    Report Status PENDING  Incomplete      Studies: No results found.  Scheduled Meds: . (feeding supplement) PROSource Plus  30 mL Oral BID BM  . Chlorhexidine Gluconate Cloth  6 each Topical Q0600  . Chlorhexidine Gluconate Cloth  6 each Topical Q0600  . darbepoetin (ARANESP) injection - DIALYSIS  60 mcg Intravenous Q Sat-HD  . feeding supplement  1 Container Oral TID BM  . feeding supplement  237 mL Oral TID BM  . lactulose  20 g Oral TID  . midodrine  10 mg Oral TID  . multivitamin  1 tablet Oral QHS  . phytonadione  5 mg Oral Once  . rifaximin  550 mg Oral BID    Continuous Infusions: . sodium chloride    . sodium chloride    . amiodarone 30 mg/hr (10/13/20 1115)  . ceFEPime (MAXIPIME) IV 1 g (10/13/20 1130)     LOS: 4 days     Kayleen Memos, MD [facility] Pager [phone]  If 7PM-7AM, please contact night-coverage [email or web address] Password [facility] 10/13/2020, 3:37 PM

## 2020-10-13 NOTE — TOC Progression Note (Signed)
Transition of Care [facility]) - Progression Note    Patient Details  Name: Dillon Perkins MRN: [id number] Date of Birth: [birth year]  Transition of Care [facility]) CM/SW Contact  Graves-Bigelow, Ocie Cornfield, RN Phone Number: 10/13/2020, 1:56 PM  Clinical Narrative:  Case Manager received a consult from the palliative nurse practitioner to offer choice for outpatient palliative services. Case Manager spoke with the patient and wife was at the bedside. Husband is agreeable to palliative and home health services. Medicare.gov list provided to the wife. Wife chose [place] for home health physical therapy and [facility] for palliative services. Referrals made to both agencies. Start of care for home health services will begin within 24-48 hours post transition home. Palliative will call the patient at home. Durable medical equipment (DME) ordered via [employer]-shower chair, bedside commode and rolling walker, should be delivered to the room prior to transition home.   Expected Discharge Plan: [place] Barriers to Discharge: Continued Medical Work up  Expected Discharge Plan and Services Expected Discharge Plan: [place] In-house Referral: Hospice / Palliative Care Discharge Planning Services: CM Consult Post Acute Care Choice: [place] (Palliative) Living arrangements for the past 2 months: Single Family Home                 DME Arranged: Walker rolling,Shower stool,Bedside commode DME Agency: [employer] Date DME Agency Contacted: 10/13/20   Representative spoke with at DME Agency: Annie Sable HH Arranged: PT [place]: [place] Date [place]: 10/13/20 Time [place]: 1300 Representative spoke with at [place]: [place]   Readmission Risk Interventions Readmission Risk Prevention Plan 10/11/2020  Transportation Screening Complete  Medication Review Press photographer) Complete  HRI or [place] Complete  SW Recovery Care/Counseling Consult Complete  [place] Not Applicable  Some recent data might be hidden

## 2020-10-13 NOTE — Progress Notes (Signed)
Pharmacy Antibiotic Note  Dillon Perkins is a 70 y.o. male admitted on 10/09/2020 with intra-abdominal infection.  Pharmacy has been consulted for cefepime dosing. Vancomycin being added with history of staph hominis bacteremia.  He is noted with ESRD with first HD on 4/5, titrating up dialysis Rx. Back on outpatient schedule TTS with last HD yesterday. Peritoneal fluid culture is NG x2 days. Today is D5 of cefepime therapy.   Plan: Continue Cefepime 1g IV q24h Follow clinical progress, duration of therapy   Height: 5\' 8"  (172.7 cm) Weight: 80.9 kg (178 lb 4.8 oz) IBW/kg (Calculated) : 68.4  Temp (24hrs), Avg:98.3 F (36.8 C), Min:97.6 F (36.4 C), Max:98.7 F (37.1 C)  Recent Labs  Lab 10/09/20 0924 10/09/20 1055 10/09/20 1055 10/09/20 1335 10/09/20 2023 10/10/20 0547 10/11/20 0554 10/11/20 1431 10/12/20 0222  WBC  --  7.2   < >  --  7.1 6.9 5.3 6.0 5.9  CREATININE  --  3.35*  --   --  3.21* 3.41* 2.95*  --  2.33*  LATICACIDVEN 4.0*  --   --  7.9* 3.0*  --   --   --   --    < > = values in this interval not displayed.    Estimated Creatinine Clearance: 28.1 mL/min (A) (by C-G formula based on SCr of 2.33 mg/dL (H)).    Allergies  Allergen Reactions  . Losartan Potassium Other (See Comments)  . Other Other (See Comments)  . Xarelto [Rivaroxaban] Other (See Comments)    gross hematuria    Antimicrobials this admission: 4/6 vancomycin x1  4/6 cefepime >>    Dose adjustments this admission:   Microbiology results: 4/6 urine- neg 4/6 blood x2- GPC 1/4. Staph capitis- likely contaminant 4/7 peritoneal fluid NGTD   Rebbeca Paul, PharmD PGY1 Pharmacy Resident 10/13/2020 8:05 AM  Please check [email or web address] for unit-specific pharmacy phone numbers.

## 2020-10-14 ENCOUNTER — Inpatient Hospital Stay: Payer: Medicare Other | Admitting: Nurse Practitioner

## 2020-10-14 ENCOUNTER — Inpatient Hospital Stay (HOSPITAL_COMMUNITY): Payer: Medicare Other

## 2020-10-14 ENCOUNTER — Inpatient Hospital Stay: Payer: Medicare Other

## 2020-10-14 DIAGNOSIS — K7460 Unspecified cirrhosis of liver: Secondary | ICD-10-CM | POA: Diagnosis not present

## 2020-10-14 DIAGNOSIS — I10 Essential (primary) hypertension: Secondary | ICD-10-CM

## 2020-10-14 DIAGNOSIS — E785 Hyperlipidemia, unspecified: Secondary | ICD-10-CM

## 2020-10-14 DIAGNOSIS — C220 Liver cell carcinoma: Secondary | ICD-10-CM

## 2020-10-14 DIAGNOSIS — Z79899 Other long term (current) drug therapy: Secondary | ICD-10-CM

## 2020-10-14 DIAGNOSIS — R3129 Other microscopic hematuria: Secondary | ICD-10-CM

## 2020-10-14 DIAGNOSIS — N400 Enlarged prostate without lower urinary tract symptoms: Secondary | ICD-10-CM

## 2020-10-14 DIAGNOSIS — K7290 Hepatic failure, unspecified without coma: Secondary | ICD-10-CM | POA: Diagnosis not present

## 2020-10-14 DIAGNOSIS — N185 Chronic kidney disease, stage 5: Secondary | ICD-10-CM

## 2020-10-14 DIAGNOSIS — K449 Diaphragmatic hernia without obstruction or gangrene: Secondary | ICD-10-CM

## 2020-10-14 DIAGNOSIS — N1330 Unspecified hydronephrosis: Secondary | ICD-10-CM

## 2020-10-14 DIAGNOSIS — D631 Anemia in chronic kidney disease: Secondary | ICD-10-CM

## 2020-10-14 DIAGNOSIS — G9341 Metabolic encephalopathy: Secondary | ICD-10-CM | POA: Diagnosis not present

## 2020-10-14 HISTORY — PX: IR PARACENTESIS: IMG2679

## 2020-10-14 LAB — COMPREHENSIVE METABOLIC PANEL
ALT: 37 U/L (ref 0–44)
AST: 63 U/L — ABNORMAL HIGH (ref 15–41)
Albumin: 3.6 g/dL (ref 3.5–5.0)
Alkaline Phosphatase: 99 U/L (ref 38–126)
Anion gap: 12 (ref 5–15)
BUN: 36 mg/dL — ABNORMAL HIGH (ref 8–23)
CO2: 25 mmol/L (ref 22–32)
Calcium: 9.3 mg/dL (ref 8.9–10.3)
Chloride: 98 mmol/L (ref 98–111)
Creatinine, Ser: 2.67 mg/dL — ABNORMAL HIGH (ref 0.61–1.24)
GFR, Estimated: 25 mL/min — ABNORMAL LOW (ref >60)
Glucose, Bld: 120 mg/dL — ABNORMAL HIGH (ref 70–99)
Potassium: 3.6 mmol/L (ref 3.5–5.1)
Sodium: 135 mmol/L (ref 135–145)
Total Bilirubin: 3.7 mg/dL — ABNORMAL HIGH (ref 0.3–1.2)
Total Protein: 6.3 g/dL — ABNORMAL LOW (ref 6.5–8.1)

## 2020-10-14 LAB — CULTURE, BLOOD (ROUTINE X 2)
Culture: NO GROWTH
Special Requests: ADEQUATE

## 2020-10-14 LAB — PROTIME-INR
INR: 1.9 — ABNORMAL HIGH (ref 0.8–1.2)
Prothrombin Time: 20.9 seconds — ABNORMAL HIGH (ref 11.4–15.2)

## 2020-10-14 LAB — CBC
HCT: 26.6 % — ABNORMAL LOW (ref 39.0–52.0)
Hemoglobin: 8.9 g/dL — ABNORMAL LOW (ref 13.0–17.0)
MCH: 36.6 pg — ABNORMAL HIGH (ref 26.0–34.0)
MCHC: 33.5 g/dL (ref 30.0–36.0)
MCV: 109.5 fL — ABNORMAL HIGH (ref 80.0–100.0)
Platelets: 68 10*3/uL — ABNORMAL LOW (ref 150–400)
RBC: 2.43 MIL/uL — ABNORMAL LOW (ref 4.22–5.81)
RDW: 17.2 % — ABNORMAL HIGH (ref 11.5–15.5)
WBC: 6.1 10*3/uL (ref 4.0–10.5)
nRBC: 0.7 % — ABNORMAL HIGH (ref 0.0–0.2)

## 2020-10-14 LAB — AMMONIA: Ammonia: 32 umol/L (ref 9–35)

## 2020-10-14 LAB — CYTOLOGY - NON PAP

## 2020-10-14 MED ORDER — CAMPHOR-MENTHOL 0.5-0.5 % EX LOTN
TOPICAL_LOTION | Freq: Two times a day (BID) | CUTANEOUS | 0 refills | Status: AC
Start: 2020-10-14 — End: ?

## 2020-10-14 MED ORDER — LIDOCAINE HCL 1 % IJ SOLN
INTRAMUSCULAR | Status: AC
  Filled 2020-10-14: qty 20

## 2020-10-14 MED ORDER — RIFAXIMIN 550 MG PO TABS
550.0000 mg | ORAL_TABLET | Freq: Two times a day (BID) | ORAL | 0 refills | Status: DC
Start: 2020-10-14 — End: 2020-10-21

## 2020-10-14 MED ORDER — DILTIAZEM HCL 30 MG PO TABS: 15.0000 mg | ORAL_TABLET | Freq: Two times a day (BID) | ORAL | 0 refills | Status: DC

## 2020-10-14 MED ORDER — PROSOURCE PLUS PO LIQD
30.0000 mL | Freq: Three times a day (TID) | ORAL | Status: DC
  Filled 2020-10-14: qty 30

## 2020-10-14 MED ORDER — MIDODRINE HCL 10 MG PO TABS: 10.0000 mg | ORAL_TABLET | Freq: Three times a day (TID) | ORAL | 0 refills | Status: DC

## 2020-10-14 MED ORDER — CAMPHOR-MENTHOL 0.5-0.5 % EX LOTN
TOPICAL_LOTION | Freq: Two times a day (BID) | CUTANEOUS | Status: DC
  Filled 2020-10-14: qty 222

## 2020-10-14 MED ORDER — PROSOURCE PLUS PO LIQD: 30.0000 mL | Freq: Three times a day (TID) | ORAL | 0 refills | Status: DC

## 2020-10-14 MED ORDER — RENA-VITE PO TABS: 1.0000 | ORAL_TABLET | Freq: Every day | ORAL | 0 refills | Status: AC

## 2020-10-14 MED ORDER — PROSOURCE PLUS PO LIQD: 30.0000 mL | Freq: Two times a day (BID) | ORAL | 0 refills | Status: DC

## 2020-10-14 MED ORDER — ENSURE ENLIVE PO LIQD: 237.0000 mL | Freq: Four times a day (QID) | ORAL | Status: DC

## 2020-10-14 MED ORDER — LACTULOSE 10 GM/15ML PO SOLN: 20.0000 g | Freq: Three times a day (TID) | ORAL | 0 refills | Status: DC

## 2020-10-14 MED ORDER — ENSURE ENLIVE PO LIQD: 237.0000 mL | Freq: Four times a day (QID) | ORAL | 12 refills | Status: AC

## 2020-10-14 MED ORDER — LIDOCAINE HCL 1 % IJ SOLN
INTRAMUSCULAR | Status: DC | PRN
  Administered 2020-10-14: 10 mL

## 2020-10-14 NOTE — Progress Notes (Addendum)
Late Entry: Renal Navigator received inquiry from Palliative Care NP on transportation options for patient to get to/from HD. She states she plans to follow up with patient and wife after MRI as it sounds they may be able to further make decisions about GOC based on the results of MRI. Navigator reached out to A. Workman/TOC CSW to inform that patient qualifies for [facility] [place] (Navigator completed application and provided it to CSW in the event that patient/wife want to apply) and asked that she follow up with patient/wife after MRI when goals of care are more determined and PT/OT have made recommendations.   Alphonzo Cruise, [place] Renal Navigator [phone]

## 2020-10-14 NOTE — Care Management Important Message (Signed)
Important Message  Patient Details  Name: Dillon Perkins MRN: [id number] Date of Birth: [birth year]   Medicare Important Message Given:  Yes     [name] 10/14/2020, 10:12 AM

## 2020-10-14 NOTE — Discharge Summary (Addendum)
Discharge Summary  Dillon Perkins BSJ:[id number] DOB: Mar 02, 1951  PCP: Dillon Contras, MD  Admit date: 10/09/2020 Discharge date: 10/14/2020  Time spent: 35 minutes.  Recommendations for Outpatient Follow-up:  1. Follow-up with medical oncology in 1 to 2 weeks. 2. Follow-up with GI in 1 to 2 weeks. 3. Take your medications as prescribed. 4. Continue PT OT with assistance and fall precautions. 5. Completely abstain from opiates and benzodiazepines use. 6. Avoid NSAIDs. 7. Increase protein consumption in your diet. 8. Abide by a renal diet with fluid restriction 1.2 L daily and a low sodium diet less than 2 g/day.   Dietitian's recommendations:  Nutrition Follow-up  DOCUMENTATION CODES:   Non-severe (moderate) malnutrition in context of chronic illness  INTERVENTION:  Provide Ensure Enlive po QID, each supplement provides 350 kcal and 20 grams of protein  Provide 30 ml Prosource plus po TID, each supplement provides 100 kcal and 15 grams of protein.   Encourage adequate PO intake.   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) as evidenced by moderate fat depletion,severe fat depletion,mild muscle depletion,moderate muscle depletion,severe muscle depletion,energy intake < 75% for > or equal to 1 month,percent weight loss; ongoing  GOAL:   Patient will meet greater than or equal to 90% of their needs; progressing  MONITOR:   PO intake,Supplement acceptance,Skin,Weight trends,Labs,I & O's  REASON FOR ASSESSMENT:   Malnutrition Screening Tool  ASSESSMENT:   70 yo male with a PMH of hepatocellular carcinoma s/p radio embolization, immunotherapy with Nivolumab x10 cycles last cycle was September 03, 7652, alcoholic cirrhosis with portal hypertension, PAF, GERD, HLD, HTN, and CKD stage 5 starting dialysis who presents with hepatic encephalopathy and sepsis.  4/4 - PTA paracentesis  with outpt GI - removed 3 L of yellow fluid 4/5 - PTA first dialysis, tolerated well 4/11 s/p paracentesis   Diet advanced to regular solid texture currently on a 2 gram sodium diet. MD recommend 1.2 L fluid restriction. Pt underwent paracentesis today with 4.6 L removal. PO intake has been poor, however pt currently has nutritional supplements and have been consuming them well. Noted pt favors Ensure over Boost Breeze. RD to modify orders and order Ensure QID. Will additionally order Prosource TID to aid in increase protein needs. Possible plans for discharge home today. Recommend continuation of nutritional supplements post discharge for adequate nutrition.    Discharge Diagnoses:  Active Hospital Problems   Diagnosis Date Noted  . Acute metabolic encephalopathy   . Alcoholic cirrhosis of liver with ascites ([facility])   . Malnutrition of moderate degree 10/10/2020  . Sepsis ([facility]) 10/09/2020  . Delirium 10/09/2020  . Atrial fibrillation with RVR ([place]) 01/31/2012    Resolved Hospital Problems  No resolved problems to display.    Discharge Condition: Stable.  Diet recommendation: Abide by a renal diet with fluid restriction 1.2 L daily and a low sodium diet less than 2 g/day.  Vitals:   10/14/20 1100 10/14/20 1532  BP: 102/77 115/73  Pulse:  88  Resp:  20  Temp:  (!) 97.4 F (36.3 C)  SpO2:  100%    History of present illness:  Dillon Coble Hofmannis a 70 y.o.malewith medical history significant ofhepatocellular carcinoma status post radio embolization, immunotherapywith Nivolumabx10 cycles, last cycle was September 03, 6501, alcoholic decompensated cirrhosis with portal hypertension, PAF not on anticoagulation due to risk of bleeding, CKD stage V, new ESRD on HD TTS, chronic hypotension on Midodrine who  presented with acute hepatic encephalopathy with ammonia level of 190.  Due to concern for active infective process he was started on IV antibiotics empirically cefepime and IV  vancomycin.  In the ED he was found to be in A. fib with RVR, was started on Cardizem drip with improvement of his heart rate.  Cardizem was stopped on 10/10/2020 due to hypotension.  His admission CT head was negative for any acute intracranial findings.  Patient was started on lactulose enema with improvement of his ammonia level and of his mentation.  Status post paracentesis with 5.8L of fluid removed on 10/10/2020, peritoneal fluid was sent for analysis.  No evidence of SBP.    From medical oncology cytology revealed atypical cells.  He received IV albumin throughout the course of his hospitalization for intravascular volume repletion, and IV vitamin K 5 mg daily x3 doses due to coagulopathy with INR of 1.9.   He had another episode of A. fib RVR on 10/13/2020 for which cardiology was consulted.  Recommended low-dose p.o. Cardizem, was started on 15 mg twice daily for rate control.  MRI with and without contrast done on 10/12/2020 revealed large volume ascites, regression of previous suspicious lesions postembolization except for 1 in right lobe which is slightly larger.  MRI liver with and without contrast done on 10/12/2020: IMPRESSION: 1. 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 Many of the previously noted suspicious lesions have regressed or disappeared following prior embolization procedure. The other lesions which remain are generally stable compared to the prior study, with the exception of the lesion in the right lobe of the liver between segments 5 and 6 which is slightly larger than the prior study, but demonstrates no definitive internal enhancement. Continued attention on follow-up studies is recommended to ensure stability or regression. 2. Moderate to large volume of ascites. 3. Multiple small cystic lesions scattered throughout the pancreas measuring 1.1 cm in size or less, stable compared to prior studies, likely to  represent benign lesions as discussed above. Attention at time of routine follow-up is recommended to ensure continued stability. 4. Small hiatal hernia. 5. Mild cardiomegaly.    10/14/20:  Patient was seen and examined at his bedside.  He has no new complaint.  He is eager to go home.   Hospital Course:  Active Problems:   Atrial fibrillation with RVR (H[facility])   Sepsis ([facility])   Delirium   Malnutrition of moderate degree   Acute metabolic encephalopathy   Alcoholic cirrhosis of liver with ascites ([facility])  Resolved acute hepatic encephalopathy likely secondary to medication noncompliance with lactulose. Presented with confusion and elevated ammonia level, 190 on 10/09/2020.  Repeated ammonia level 32 on 10/14/2020. Had stopped taking his lactulose and took Imodium instead to slow down his bowel movements. Restarted lactulose on presentation with improvement of ammonia level and symptomatology. Was also started on IV antibiotics empirically, IV vancomycin and cefepime. Mentation is currently back to baseline. No evidence of SBP. Peritoneal fluid culture negative to date. Received IV albumin for intravascular volume depletion to maintain MAP greater than 65. Continue lactulose with goal of 2-3 bowel movements per day as recommended by GI. Completely abstain from opiates and benzodiazepines use.  Paroxysmal A. fib with RVR Initially on Cardizem drip, stopped on 10/10/2020 due to hypotension. A. fib with RVR on 10/13/2020 for which cardiology was consulted. Avoid amiodarone in the setting of liver disease Seen by cardiology, recommended low-dose short acting diltiazem for rate control.  Currently on p.o. Cardizem 15 mg twice daily. Follow-up with your cardiologist outpatient.  Decompensated alcoholic cirrhosis with [place] Meld score of 34 on 10/12/2020. Patient's wife is interested in following up with hepatology and considering the possibility of liver transplant. Continue lactulose and  rifaximin as recommended by GI. With his end-stage renal disease diuretics are not an option per GI. No evidence of SBP. Continue  less than 2 g sodium restricted diet. Goal 2-3 soft stools per day while on lactulose, titrate to achieve that goal. MRI with and without contrast done on 10/12/2020 revealed large volume ascites, regression of previous suspicious lesions postembolization except for 1 in right lobe which is slightly larger. Management per GI and oncology. Avoid NSAIDs. Increase protein consumption in your diet. Abide by a renal diet with fluid restriction 1.2 L daily and a low sodium diet less than 2 g/day.  Large volume ascites in the setting of decompensated alcoholic liver cirrhosis Paracentesis by IR on 10/10/2020 with 5.8 L peritoneal fluid removed. Repeated paracentesis by IR on 10/14/2020 with 4.6 L of peritoneal fluid removed. May require weekly paracentesis per GI.   New ESRD on HD TTS Management per nephrology Next HD on 10/15/2020 outpatient.  Coagulopathy in the setting of [place] Has received IV vitamin K INR 1.9   Staph capitis positive blood culture 1 out of 2 bottles, likely contaminant per infectious disease Blood culture drawn on 10/09/2020 + Staph capitis, likely contaminant. Curbsided with infectious disease Dr. Tommy Medal on 10/14/2020, no need to treat.  History of staph hominis bacterial peritonitis on 08/02/20 With resistance to oxacillin  Improving hyperbilirubinemia with jaundice T bili is downtrending 3.7 from 4.2 from 5.3 from 6.8. AST to ALT 2:1 ratio  Resolved hyperammonemia in the setting of decompensated alcoholic cirrhosis Presented with ammonia level 190>> 32. Continue lactulose and rifaximin as recommended by GI.  Resolved mild anion gap metabolic acidosis Serum bicarb  25 from 21 Electrolytes addressed with hemodialysis  Resolved hyponatremia, likely dilutional in the setting of end-stage renal disease. Serum sodium 135 from  133 Addressed with hemodialysis  Chronic hypotension on midodrine Continue midodrine Has received throughout his hospitalization IV albumin. Maintain MAP greater than 65.  Anemia of chronic disease  Hemoglobin 8.9K from 9.1K No overt bleeding  Chronic thrombocytopenia in the setting of cirrhosis Platelet count 68K from 62K  No overt bleeding.  Physical debility PT assessment recommended home health PT Continue home health PT OT with assistance and fall precautions.  Goals of care Palliative care team consult to assist with establishing goals of care. Currently the patient is DNR. Outpatient palliative care follow-up.  Code Status: DNR.    Consultants:  GI.  Nephrology.  IR for diagnostic paracentesis  Palliative care team.  Procedures:  HD on 10/10/2020, on 10/11/2020, on 10/12/2020.  Antimicrobials:  IV vancomycin  IV cefepime.    Discharge Exam: BP 115/73 (BP Location: Right Arm)   Pulse 88   Temp (!) 97.4 F (36.3 C) (Oral)   Resp 20   Ht 5\' 8"  (1.727 m)   Wt 81.9 kg   SpO2 100%   BMI 27.44 kg/m  . General: 70 y.o. year-old male well developed well nourished in no acute distress.  Alert and oriented x3. . Cardiovascular: Irregular rate and rhythm with no rubs or gallops.  No thyromegaly or JVD noted.   Marland Kitchen Respiratory: Clear to auscultation with no wheezes or rales. Good inspiratory effort. . Abdomen: Soft nontender nondistended with normal bowel sounds x4 quadrants. Marland Kitchen  Musculoskeletal: No lower extremity edema.  Marland Kitchen Psychiatry: Mood is appropriate for condition and setting  Discharge Instructions You were cared for by a hospitalist during your hospital stay. If you have any questions about your discharge medications or the care you received while you were in the hospital after you are discharged, you can call the unit and asked to speak with the hospitalist on call if the hospitalist that took care of you is not available. Once you are  discharged, your primary care physician will handle any further medical issues. Please note that NO REFILLS for any discharge medications will be authorized once you are discharged, as it is imperative that you return to your primary care physician (or establish a relationship with a primary care physician if you do not have one) for your aftercare needs so that they can reassess your need for medications and monitor your lab values.   Allergies as of 10/14/2020      Reactions   Losartan Potassium Other (See Comments)   Other Other (See Comments)   Xarelto [rivaroxaban] Other (See Comments)   gross hematuria      Medication List    STOP taking these medications   acetaminophen 500 MG tablet Commonly known as: TYLENOL   CoQ10 100 MG Caps   furosemide 40 MG tablet Commonly known as: LASIX   hydrOXYzine 10 MG tablet Commonly known as: ATARAX/VISTARIL   LORazepam 0.5 MG tablet Commonly known as: ATIVAN   spironolactone 50 MG tablet Commonly known as: ALDACTONE     TAKE these medications   (feeding supplement) PROSource Plus liquid Take 30 mLs by mouth 2 (two) times daily between meals for 7 days.   (feeding supplement) PROSource Plus liquid Take 30 mLs by mouth 3 (three) times daily between meals for 7 days.   feeding supplement Liqd Take 237 mLs by mouth 4 (four) times daily.   camphor-menthol lotion Commonly known as: SARNA Apply topically 2 (two) times daily.   diltiazem 30 MG tablet Commonly known as: Cardizem Take 0.5 tablets (15 mg total) by mouth every 12 (twelve) hours. What changed:   how much to take  when to take this   lactulose 10 GM/15ML solution Commonly known as: CHRONULAC Take 30 mLs (20 g total) by mouth 3 (three) times daily.   midodrine 10 MG tablet Commonly known as: PROAMATINE Take 1 tablet (10 mg total) by mouth 3 (three) times daily.   multivitamin Tabs tablet Take 1 tablet by mouth at bedtime.   rifaximin 550 MG Tabs  tablet Commonly known as: XIFAXAN Take 1 tablet (550 mg total) by mouth 2 (two) times daily.            Durable Medical Equipment  (From admission, onward)         Start     Ordered   10/13/20 1349  For home use only DME Shower stool  Once        10/13/20 1348   10/12/20 1705  For home use only DME 3 n 1  Once        10/12/20 1704   10/12/20 1705  For home use only DME Walker rolling  Once       Question Answer Comment  Walker: With 5 Inch Wheels   Patient needs a walker to treat with the following condition Ambulatory dysfunction      10/12/20 1704         Allergies  Allergen Reactions  . Losartan Potassium Other (See Comments)  .  Other Other (See Comments)  . Xarelto [Rivaroxaban] Other (See Comments)    gross hematuria    Follow-up Information    [facility] Follow up.   Why: Palliative Services only-office to call the patient with visit times. Contact information: [address] [place] [id number] [place] Follow up.   Specialty: Home Health Services Why: Physical Therapy-office to call with visit times  Contact information: [place] [address] [phone]        [facility] Follow up.   Why: Bedside commode, Shower chair, rolling walker-to be delivered to the room prior to transition home.  Contact information: [place] [address] [phone]        Dillon Contras, MD. Call in 1 day(s).   Specialty: Family Medicine Why: Please call for a post hospital follow up appointment. Contact information: [place] [address] [phone]        Minus Breeding, MD .   Specialty: Cardiology Contact information: [address] [place] [address] [phone]        Ladell Pier, MD. Call in 1 day(s).   Specialty: Oncology Why: Please call for a post hospital  follow up appointment. Contact information: [place] [address] [phone]                The results of significant diagnostics from this hospitalization (including imaging, microbiology, ancillary and laboratory) are listed below for reference.    Significant Diagnostic Studies: CT Abdomen Pelvis Wo Contrast  Result Date: 10/09/2020 CLINICAL DATA:  Abdominal distension. 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 COMPARISON:  MRI from 07/04/2020 FINDINGS: Lower chest: Trace bilateral pleural effusions identified. Hepatobiliary: The liver appears cirrhotic. Treated tumor within segment 2 is measures 5.3 x 4.3 cm on today's exam, image 17/3. This is compared with 5.0 x 4.6 cm previously. The other known liver lesions are difficult to assess reflecting lack of IV contrast material. Sludge and tiny stones identified within the dependent portion of the gallbladder. Pancreas: Unremarkable. No pancreatic ductal dilatation or surrounding inflammatory changes. Spleen: Normal in size without focal abnormality. Adrenals/Urinary Tract: Normal appearance of the adrenal glands. Small bilateral renal calculi identified. No hydronephrosis. Urinary bladder is unremarkable. Stomach/Bowel: The stomach appears nondistended. Small bowel loops are unremarkable. Mild diffuse colonic wall thickening is nonspecific in the setting of ascites and portal venous hypertension. Vascular/Lymphatic: Aortic atherosclerosis. No aneurysm. Previous GDA embolization. Esophageal and upper abdominal varices. No abdominopelvic adenopathy. Reproductive: Prostate is unremarkable. Other: Large volume of ascites within the abdomen and pelvis. A left inguinal hernia is identified which is filled with ascites as  well as a nonobstructed loop of sigmoid colon. No discrete fluid collections identified. Musculoskeletal: Bilateral L5 pars defects identified. Mild anterolisthesis of L5 on S1 noted. IMPRESSION: 1. Large volume of ascites identified within the abdomen and pelvis. 2. Cirrhosis with known multifocal hepatocellular carcinoma. Status post Y 90 microsphere ablation. Dominant lesion in segment 2 appears similar to 07/04/2020 3.  Aortic Atherosclerosis (ICD10-I70.0). 4. Gallstones. 5. Trace pleural effusions. Electronically Signed   By: Kerby Moors M.D.   On: 10/09/2020 13:49   CT Head Wo Contrast  Result Date: 10/09/2020 CLINICAL DATA:  Neurological deficit EXAM: CT HEAD WITHOUT CONTRAST TECHNIQUE: Contiguous axial images were obtained from the base of the skull through the vertex without intravenous contrast. COMPARISON:  Prior head CT 05/29/2020 FINDINGS: Brain: No evidence of acute infarction, hemorrhage, hydrocephalus, extra-axial collection or mass lesion/mass effect. Stable scattered foci of white matter hypoattenuation consistent with chronic microvascular ischemic white matter disease. Tiny bilateral cerebellar hemisphere lacunar infarcts are also unchanged. Mild cortical atrophy without interval progression. Vascular: No hyperdense vessel or unexpected calcification. Atherosclerotic vascular calcifications in the bilateral cavernous and supraclinoid carotid arteries. Skull: Normal. Negative for fracture or focal lesion. Sinuses/Orbits: No acute finding. Other: None. IMPRESSION: 1. No acute intracranial abnormality. 2. Stable cortical atrophy, chronic microvascular ischemic white matter disease and bilateral cerebellar hemisphere lacunar infarcts. Electronically Signed   By: Jacqulynn Cadet M.D.   On: 10/09/2020 11:00   MR LIVER W WO CONTRAST  Result Date: 10/13/2020 CLINICAL DATA:  70 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 Staging examination. Acute hepatic encephalopathy. EXAM: MRI  ABDOMEN WITHOUT AND WITH CONTRAST TECHNIQUE: Multiplanar multisequence MR imaging of the abdomen was performed both before and after the administration of intravenous contrast. CONTRAST:  84mL GADAVIST GADOBUTROL 1 MMOL/ML IV SOLN COMPARISON:  Abdominal MRI 07/04/2020. FINDINGS: Lower chest: Small hiatal hernia.  Mild cardiomegaly. Hepatobiliary: Liver has a shrunken appearance and nodular contour, indicative of advanced cirrhosis. Diffuse loss of signal intensity throughout the hepatic parenchyma on in phase dual echo images and diffuse low T2 signal intensity throughout the hepatic parenchyma, indicative of extensive iron deposition. Numerous T1 and T2 hypointense nodules scattered throughout the hepatic parenchyma, most of which are subcentimeter in size and do not enhance. In addition, there are 2 larger lesions which are T1 hyperintense and generally T2 hypointense, without enhancement measuring 5.4 x 4.4 cm in segment 2 (axial image 29 of series 10/1) which is stable compared to the prior examination, and 5.8 x 3.9 cm predominantly in segments 5 and 6 (axial image 42 of series 10/1) which is increased in size compared to the prior study (previously 5.3 x 3.2 cm). Both of these lesions demonstrates some degree of diffusion restriction. In segment 3 of the liver there is a T1 hypointense nonenhancing area (axial image 38 of series 1038) which corresponds to a previously noted lesion, likely a indicative of post embolization scarring. Additional lesion in segment 8 of the liver (axial image 34 of series 10/1) measuring 1.3 cm in diameter, stable in retrospect compared to the prior study, also mildly T1 hyperintense, T2 hypointense without definite enhancement on post gadolinium imaging. Other previously noted smaller lesions are not confidently identified on today's examination. No intra or extrahepatic biliary ductal dilatation. Pancreas: Multiple tiny T1 hypointense, T2 hyperintense, nonenhancing lesions are  noted throughout the pancreas, largest of which is in the tail of the pancreas measuring only 11 mm, similar in size and number to the prior examination, favored to represent small benign lesions such as pancreatic pseudocysts or side branch IPMNs (intraductal papillary mucinous neoplasms). No definite communication with the main pancreatic duct noted. No peripancreatic fluid collections. Spleen: Spleen is mildly enlarged measuring 11.1 x 6.9 x 10.9 cm (estimated splenic volume of 417 mL) . Adrenals/Urinary Tract: bilateral kidneys and adrenal glands are normal in appearance. No hydroureteronephrosis in the visualized portions of the abdomen. Stomach/Bowel: Visualized portions are unremarkable. Vascular/Lymphatic: Aortic atherosclerosis, without evidence of aneurysm in the visualized abdominal vasculature. Portal vein is patent and normal in caliber. No lymphadenopathy noted in the abdomen. Other:  Moderate  to large volume of ascites. Musculoskeletal: No aggressive appearing osseous lesions are noted in the visualized portions of the skeleton. IMPRESSION: 1. 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 Many of the previously noted suspicious lesions have regressed or disappeared following prior embolization procedure. The other lesions which remain are generally stable compared to the prior study, with the exception of the lesion in the right lobe of the liver between segments 5 and 6 which is slightly larger than the prior study, but demonstrates no definitive internal enhancement. Continued attention on follow-up studies is recommended to ensure stability or regression. 2. Moderate to large volume of ascites. 3. Multiple small cystic lesions scattered throughout the pancreas measuring 1.1 cm in size or less, stable compared to prior studies, likely to represent benign lesions as discussed above. Attention at time of routine follow-up is recommended to  ensure continued stability. 4. Small hiatal hernia. 5. Mild cardiomegaly. Electronically Signed   By: Vinnie Langton M.D.   On: 10/13/2020 09:57   DG Chest Port 1 View  Result Date: 10/09/2020 CLINICAL DATA:  Altered mental status. History of hepatocellular carcinoma EXAM: PORTABLE CHEST 1 VIEW COMPARISON:  May 22, 2020. FINDINGS: Note that a small amount of the lateral left base not included. Central catheter tip is in the right atrium. No pneumothorax. There is no edema or airspace opacity. Heart is mildly enlarged with pulmonary vascularity normal. No adenopathy. There is aortic atherosclerosis. No bone lesions. A focus of calcification is noted in the left carotid artery. IMPRESSION: Note that a small portion of the lateral left base not included. Visualized lungs clear. Stable cardiac prominence. Central catheter tip in right atrium without pneumothorax. Aortic Atherosclerosis (ICD10-I70.0). Focus of calcification noted in left carotid artery. Electronically Signed   By: Lowella Grip III M.D.   On: 10/09/2020 09:36   US LIVER DOPPLER  Result Date: 10/10/2020 CLINICAL DATA:  Cirrhosis of liver with ascites EXAM: DUPLEX ULTRASOUND OF LIVER TECHNIQUE: Color and duplex Doppler ultrasound was performed to evaluate the hepatic in-flow and out-flow vessels. COMPARISON:  CT abdomen/pelvis yesterday FINDINGS: Liver: Diffusely nodular liver contour. The liver appears small in the parenchyma is diffusely heterogeneous and echogenic. Hypoechoic soft tissue mass in the left upper lobe measures 4.8 x 4.4 x 5.0 cm. Hypoechoic solid mass in the right hepatic lobe measures approximately 4.9 x 5.7 x 8.3 cm. Diffusely thickened gallbladder wall. Sludge is present within the gallbladder lumen. The common bile duct is normal at 3 mm. Main Portal Vein size: 1.2 cm Portal Vein Velocities Main Prox:  41 cm/sec with hepatopetal directional flow. Main Mid: 55 cm/sec with hepatopetal directional flow. Main Dist:  71  cm/sec with hepatopetal directional flow. Right: Unable to visualize Left: 28 cm/sec with hepatopetal directional flow. Hepatic Vein Velocities Right:  27 cm/sec Middle:  32 cm/sec Left:  68 cm/sec IVC: Present and patent with normal respiratory phasicity. Hepatic Artery Velocity:  109 cm/sec Splenic Vein Velocity:  34 cm/sec Spleen: 8.4 cm x 12.0 cm x 8.2 cm with a total volume of 436 cm^3 (411 cm^3 is upper limit normal) Portal Vein Occlusion/Thrombus: No Splenic Vein Occlusion/Thrombus: No Ascites: None Varices: Moderate ascites. IMPRESSION: 1. 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 2. Portal veins remain patent with hepatopetal directional flow. 3. Previously treated left hepatic mass measures approximately 4.8 x 4.4 x 5.0 cm. Viability of the lesion cannot be assessed with ultrasound. 4. Right hepatic mass measuring approximately  4.9 x 5.7 x 8.3 cm not identified on prior imaging. Recommend further evaluation with gadolinium-enhanced MRI of the abdomen when the patient's clinical status is improved, preferably as an outpatient to facilitate optimal imaging quality and diagnostic sensitivity. Electronically Signed   By: Jacqulynn Cadet M.D.   On: 10/10/2020 07:45   IR Paracentesis  Result Date: 10/14/2020 INDICATION: Patient with a history of hepatocellular carcinoma and alcoholic cirrhosis presents today with ascites. Interventional Radiology asked to perform a therapeutic paracentesis. EXAM: ULTRASOUND GUIDED PARACENTESIS MEDICATIONS: 1% lidocaine 10 mL COMPLICATIONS: None immediate. PROCEDURE: Informed written consent was obtained from the patient after a discussion of the risks, benefits and alternatives to treatment. A timeout was performed prior to the initiation of the procedure. Initial ultrasound scanning demonstrates a large amount of ascites within the left lower abdominal quadrant. The left lower abdomen was prepped and draped in the usual sterile  fashion. 1% lidocaine was used for local anesthesia. Following this, a 19 gauge, 7-cm, Yueh catheter was introduced. An ultrasound image was saved for documentation purposes. The paracentesis was performed. The catheter was removed and a dressing was applied. The patient tolerated the procedure well without immediate post procedural complication. FINDINGS: A total of approximately 4.6 of clear yellow fluid was removed. IMPRESSION: Successful ultrasound-guided paracentesis yielding 4.6 liters of peritoneal fluid. Read by: Soyla Dryer, NP Electronically Signed   By: Corrie Mckusick D.O.   On: 10/14/2020 13:02   IR Paracentesis  Result Date: 10/10/2020 INDICATION: History of multifocal hepatocellular cancer. Recurrent ascites. Request for diagnostic and therapeutic paracentesis. EXAM: ULTRASOUND GUIDED LEFT LOWER QUADRANT PARACENTESIS MEDICATIONS: 1% plain lidocaine, 5 mL COMPLICATIONS: None immediate. PROCEDURE: Informed written consent was obtained from the patient after a discussion of the risks, benefits and alternatives to treatment. A timeout was performed prior to the initiation of the procedure. Initial ultrasound scanning demonstrates a large amount of ascites within the left lower abdominal quadrant. The left lower abdomen was prepped and draped in the usual sterile fashion. 1% lidocaine was used for local anesthesia. Following this, a 19 gauge, 7-cm, Yueh catheter was introduced. An ultrasound image was saved for documentation purposes. The paracentesis was performed. The catheter was removed and a dressing was applied. The patient tolerated the procedure well without immediate post procedural complication. FINDINGS: A total of approximately 5.8 L of clear yellow fluid was removed. Samples were sent to the laboratory as requested by the clinical team. IMPRESSION: Successful ultrasound-guided paracentesis yielding 5.8 liters of peritoneal fluid. Read by: Ascencion Dike PA-C Electronically Signed   By:  Sandi Mariscal M.D.   On: 10/10/2020 15:40   IR Paracentesis  Result Date: 10/07/2020 INDICATION: Patient with history of cirrhosis, recurrent ascites. Request is made for therapeutic paracentesis of up to 3 L maximum. EXAM: ULTRASOUND GUIDED THERAPEUTIC PARACENTESIS MEDICATIONS: 10 mL 1% lidocaine COMPLICATIONS: None immediate. PROCEDURE: Informed written consent was obtained from the patient after a discussion of the risks, benefits and alternatives to treatment. A timeout was performed prior to the initiation of the procedure. Initial ultrasound scanning demonstrates a large amount of ascites within the right lower abdominal quadrant. The right lower abdomen was prepped and draped in the usual sterile fashion. 1% lidocaine was used for local anesthesia. Following this, a 19 gauge, 7-cm, Yueh catheter was introduced. An ultrasound image was saved for documentation purposes. The paracentesis was performed. The catheter was removed and a dressing was applied. The patient tolerated the procedure well without immediate post procedural complication. FINDINGS: A total of  approximately 3.0 liters of yellow fluid was removed. IMPRESSION: Successful ultrasound-guided therapeutic paracentesis yielding 3.0 liters of peritoneal fluid. Read by: Brynda Greathouse PA-C Electronically Signed   By: Aletta Edouard M.D.   On: 10/07/2020 13:19    Microbiology: Recent Results (from the past 240 hour(s))  Blood Culture (routine x 2)     Status: Abnormal   Collection Time: 10/09/20  9:44 AM   Specimen: BLOOD  Result Value Ref Range Status   Specimen Description BLOOD RIGHT ANTECUBITAL  Final   Special Requests   Final    BOTTLES DRAWN AEROBIC AND ANAEROBIC Blood Culture results may not be optimal due to an inadequate volume of blood received in culture bottles   Culture  Setup Time   Final    GRAM POSITIVE COCCI IN CLUSTERS AEROBIC BOTTLE ONLY Organism ID to follow CRITICAL RESULT CALLED TO, READ BACK BY AND VERIFIED WITH:  S GRUBER PHARMD [id number] 10/10/20 A BROWNING    Culture (A)  Final    STAPHYLOCOCCUS CAPITIS THE SIGNIFICANCE OF ISOLATING THIS ORGANISM FROM A SINGLE VENIPUNCTURE CANNOT BE PREDICTED WITHOUT FURTHER CLINICAL AND CULTURE CORRELATION. SUSCEPTIBILITIES AVAILABLE ONLY ON REQUEST. Performed at [facility], [place] [address] [place], [place] [address]    Report Status 10/12/2020 FINAL  Final  Blood Culture ID Panel (Reflexed)     Status: Abnormal   Collection Time: 10/09/20  9:44 AM  Result Value Ref Range Status   Enterococcus faecalis NOT DETECTED NOT DETECTED Final   Enterococcus Faecium NOT DETECTED NOT DETECTED Final   Listeria monocytogenes NOT DETECTED NOT DETECTED Final   Staphylococcus species DETECTED (A) NOT DETECTED Final    Comment: CRITICAL RESULT CALLED TO, READ BACK BY AND VERIFIED WITH: Marisue Ivan PHARMD 1301 10/10/20 A BROWNING    Staphylococcus aureus (BCID) NOT DETECTED NOT DETECTED Final   Staphylococcus epidermidis NOT DETECTED NOT DETECTED Final   Staphylococcus lugdunensis NOT DETECTED NOT DETECTED Final   Streptococcus species NOT DETECTED NOT DETECTED Final   Streptococcus agalactiae NOT DETECTED NOT DETECTED Final   Streptococcus pneumoniae NOT DETECTED NOT DETECTED Final   Streptococcus pyogenes NOT DETECTED NOT DETECTED Final   A.calcoaceticus-baumannii NOT DETECTED NOT DETECTED Final   Bacteroides fragilis NOT DETECTED NOT DETECTED Final   Enterobacterales NOT DETECTED NOT DETECTED Final   Enterobacter cloacae complex NOT DETECTED NOT DETECTED Final   Escherichia coli NOT DETECTED NOT DETECTED Final   Klebsiella aerogenes NOT DETECTED NOT DETECTED Final   Klebsiella oxytoca NOT DETECTED NOT DETECTED Final   Klebsiella pneumoniae NOT DETECTED NOT DETECTED Final   Proteus species NOT DETECTED NOT DETECTED Final   Salmonella species NOT DETECTED NOT DETECTED Final   Serratia marcescens NOT DETECTED NOT DETECTED Final   Haemophilus influenzae NOT DETECTED NOT  DETECTED Final   Neisseria meningitidis NOT DETECTED NOT DETECTED Final   Pseudomonas aeruginosa NOT DETECTED NOT DETECTED Final   Stenotrophomonas maltophilia NOT DETECTED NOT DETECTED Final   Candida albicans NOT DETECTED NOT DETECTED Final   Candida auris NOT DETECTED NOT DETECTED Final   Candida glabrata NOT DETECTED NOT DETECTED Final   Candida krusei NOT DETECTED NOT DETECTED Final   Candida parapsilosis NOT DETECTED NOT DETECTED Final   Candida tropicalis NOT DETECTED NOT DETECTED Final   Cryptococcus neoformans/gattii NOT DETECTED NOT DETECTED Final    Comment: Performed at [facility], [address], [place], [place] [address]  Blood Culture (routine x 2)     Status: None   Collection Time:  10/09/20  9:51 AM   Specimen: BLOOD RIGHT HAND  Result Value Ref Range Status   Specimen Description BLOOD RIGHT HAND  Final   Special Requests   Final    BOTTLES DRAWN AEROBIC AND ANAEROBIC Blood Culture adequate volume   Culture   Final    NO GROWTH 5 DAYS Performed at [facility], [address] [place], [place] [address]    Report Status 10/14/2020 FINAL  Final  Resp Panel by RT-PCR (Flu A&B, Covid) Nasopharyngeal Swab     Status: None   Collection Time: 10/09/20  9:51 AM   Specimen: Nasopharyngeal Swab; Nasopharyngeal(NP) swabs in vial transport medium  Result Value Ref Range Status   SARS Coronavirus 2 by RT PCR NEGATIVE NEGATIVE Final    Comment: (NOTE) SARS-CoV-2 target nucleic acids are NOT DETECTED.  The SARS-CoV-2 RNA is generally detectable in upper respiratory specimens during the acute phase of infection. The lowest concentration of SARS-CoV-2 viral copies this assay can detect is 138 copies/mL. A negative result does not preclude SARS-Cov-2 infection and should not be used as the sole basis for treatment or other patient management decisions. A negative result may occur with  improper specimen collection/handling, submission of specimen other than  nasopharyngeal swab, presence of viral mutation(s) within the areas targeted by this assay, and inadequate number of viral copies(<138 copies/mL). A negative result must be combined with clinical observations, patient history, and epidemiological information. The expected result is Negative.  Fact Sheet for Patients:  EntrepreneurPulse.com.au  Fact Sheet for Healthcare Providers:  IncredibleEmployment.be  This test is no t yet approved or cleared by the [place] FDA and  has been authorized for detection and/or diagnosis of SARS-CoV-2 by FDA under an Emergency Use Authorization (EUA). This EUA will remain  in effect (meaning this test can be used) for the duration of the COVID-19 declaration under Section 564(b)(1) of the Act, 21 U.S.C.section 360bbb-3(b)(1), unless the authorization is terminated  or revoked sooner.       Influenza A by PCR NEGATIVE NEGATIVE Final   Influenza B by PCR NEGATIVE NEGATIVE Final    Comment: (NOTE) The Xpert Xpress SARS-CoV-2/FLU/RSV plus assay is intended as an aid in the diagnosis of influenza from Nasopharyngeal swab specimens and should not be used as a sole basis for treatment. Nasal washings and aspirates are unacceptable for Xpert Xpress SARS-CoV-2/FLU/RSV testing.  Fact Sheet for Patients: EntrepreneurPulse.com.au  Fact Sheet for Healthcare Providers: IncredibleEmployment.be  This test is not yet approved or cleared by the [place] FDA and has been authorized for detection and/or diagnosis of SARS-CoV-2 by FDA under an Emergency Use Authorization (EUA). This EUA will remain in effect (meaning this test can be used) for the duration of the COVID-19 declaration under Section 564(b)(1) of the Act, 21 U.S.C. section 360bbb-3(b)(1), unless the authorization is terminated or revoked.  Performed at [facility], [place] [address] [place], [place] [address]    Urine culture     Status: None   Collection Time: 10/09/20 11:53 AM   Specimen: In/Out Cath Urine  Result Value Ref Range Status   Specimen Description IN/OUT CATH URINE  Final   Special Requests NONE  Final   Culture   Final    NO GROWTH Performed at [facility], [place] [address] [place], [place] [address]    Report Status 10/10/2020 FINAL  Final  MRSA PCR Screening     Status: None   Collection Time: 10/10/20  1:14  PM   Specimen: Nasal Mucosa; Nasopharyngeal  Result Value Ref Range Status   MRSA by PCR NEGATIVE NEGATIVE Final    Comment:        The GeneXpert MRSA Assay (FDA approved for NASAL specimens only), is one component of a comprehensive MRSA colonization surveillance program. It is not intended to diagnose MRSA infection nor to guide or monitor treatment for MRSA infections. Performed at [facility], [place] [address] [place], [place] [address]   Gram stain     Status: None   Collection Time: 10/10/20  3:10 PM   Specimen: Abdomen; Peritoneal Fluid  Result Value Ref Range Status   Specimen Description FLUID ABDOMEN PERITONEAL  Final   Special Requests NONE  Final   Gram Stain   Final    WBC PRESENT, PREDOMINANTLY PMN NO ORGANISMS SEEN CYTOSPIN SMEAR Performed at [facility], [place] [address], [place], [place] [address]    Report Status 10/11/2020 FINAL  Final  Culture, body fluid w Gram Stain-bottle     Status: None (Preliminary result)   Collection Time: 10/10/20  3:10 PM   Specimen: Fluid  Result Value Ref Range Status   Specimen Description FLUID PERITONEAL ABDOMEN  Final   Special Requests NONE  Final   Culture   Final    NO GROWTH 4 DAYS Performed at [place] [address], [place], [place] [address]    Report Status PENDING  Incomplete     Labs: Basic Metabolic Panel: Recent Labs  Lab 10/09/20 2023 10/10/20 0547 10/10/20 1254 10/11/20 0554 10/12/20 0222 10/13/20 0958 10/14/20 0250  NA 133* 136  --  136 133*  133* 135  K 4.8 4.6  --  3.7 3.4* 3.6 3.6  CL 100 104  --  100 98 96* 98  CO2 21* 21*  --  21* 25 30 25   GLUCOSE 113* 107*  --  98 121* 108* 120*  BUN 74* 78*  --  51* 31* 23 36*  CREATININE 3.21* 3.41*  --  2.95* 2.33* 2.27* 2.67*  CALCIUM 8.7* 9.2  --  9.3 9.0 9.3 9.3  PHOS 4.9*  --  3.3  --   --   --   --    Liver Function Tests: Recent Labs  Lab 10/10/20 0547 10/11/20 0554 10/12/20 0222 10/13/20 0958 10/14/20 0250  AST 85* 77* 74* 65* 63*  ALT 44 37 36 38 37  ALKPHOS 85 57 66 86 99  BILITOT 6.4* 6.8* 5.3* 4.2* 3.7*  PROT 6.7 6.6 6.7 6.6 6.3*  ALBUMIN 2.8* 3.6 3.7 3.7 3.6   Recent Labs  Lab 10/09/20 1055  LIPASE 68*   Recent Labs  Lab 10/09/20 1055 10/09/20 2023 10/10/20 0547 10/11/20 0554 10/14/20 0837  AMMONIA 190* 86* 79* 36* 32   CBC: Recent Labs  Lab 10/09/20 1055 10/09/20 1412 10/10/20 0547 10/11/20 0554 10/11/20 1431 10/12/20 0222 10/13/20 0958 10/14/20 0250  WBC 7.2   < > 6.9 5.3 6.0 5.9 6.4 6.1  NEUTROABS 6.2  --  5.5 4.1  --   --   --   --   HGB 10.1*   < > 9.1* 7.5* 8.5* 8.2* 9.1* 8.9*  HCT 30.1*   < > 26.6* 23.1* 26.1* 24.3* 27.6* 26.6*  MCV 106.4*   < > 106.0* 108.5* 108.8* 108.5* 108.7* 109.5*  PLT 106*   < > 91* 63* 60* 65* 62* 68*   < > = values in this interval not displayed.  Cardiac Enzymes: No results for input(s): CKTOTAL, CKMB, CKMBINDEX, TROPONINI in the last 168 hours. BNP: BNP (last 3 results) Recent Labs    02/27/20 0827 07/04/20 1010  BNP 326.6* 2,740.9*    ProBNP (last 3 results) No results for input(s): PROBNP in the last 8760 hours.  CBG: Recent Labs  Lab 10/09/20 0851  GLUCAP 110*       Signed:  Kayleen Memos, MD [facility] 10/14/2020, 5:33 PM

## 2020-10-14 NOTE — Progress Notes (Signed)
Daily Rounding Note  10/14/2020, 12:19 PM  LOS: 5 days   SUBJECTIVE:   Chief complaint: Hepatic encephalopathy.    Mental status much improved. Had multiple soft stools yesterday and 1 or 2 this morning.  No nausea.  No abdominal pain.  Tolerating soft diet. Underwent additional 4.6 L paracentesis this morning and abdomen feels better.  OBJECTIVE:         Vital signs in last 24 hours:    Temp:  [97.7 F (36.5 C)-98.8 F (37.1 C)] 98 F (36.7 C) (04/11 0745) Pulse Rate:  [101-118] 102 (04/11 0745) Resp:  [12-20] 18 (04/11 0745) BP: (102-125)/(66-92) 102/77 (04/11 1100) SpO2:  [97 %-99 %] 99 % (04/11 0339) Weight:  [81.9 kg] 81.9 kg (04/11 0339) Last BM Date: 10/14/20 Filed Weights   10/12/20 1500 10/13/20 0338 10/14/20 0339  Weight: 81.5 kg 80.9 kg 81.9 kg   General: Looks better. Heart: RRR. Chest: Clear bilaterally without labored breathing or cough Abdomen: Soft, mildly distended.  Not tender.  Active bowel sounds.  No masses. Extremities: No CCE. Neuro/Psych: Oriented x3.  Moves all 4 limbs.  No asterixis  Intake/Output from previous day: 04/10 0701 - 04/11 0700 In: 543.7 [P.O.:240; IV Piggyback:303.7] Out: 225 [Urine:225]  Intake/Output this shift: No intake/output data recorded.  Lab Results: Recent Labs    10/12/20 0222 10/13/20 0958 10/14/20 0250  WBC 5.9 6.4 6.1  HGB 8.2* 9.1* 8.9*  HCT 24.3* 27.6* 26.6*  PLT 65* 62* 68*   BMET Recent Labs    10/12/20 0222 10/13/20 0958 10/14/20 0250  NA 133* 133* 135  K 3.4* 3.6 3.6  CL 98 96* 98  CO2 25 30 25   GLUCOSE 121* 108* 120*  BUN 31* 23 36*  CREATININE 2.33* 2.27* 2.67*  CALCIUM 9.0 9.3 9.3   LFT Recent Labs    10/12/20 0222 10/13/20 0958 10/14/20 0250  PROT 6.7 6.6 6.3*  ALBUMIN 3.7 3.7 3.6  AST 74* 65* 63*  ALT 36 38 37  ALKPHOS 66 86 99  BILITOT 5.3* 4.2* 3.7*   PT/INR Recent Labs    10/13/20 0958 10/14/20 0250   LABPROT 20.7* 20.9*  INR 1.9* 1.9*   Hepatitis Panel Recent Labs    10/11/20 1227  HEPBSAG NON REACTIVE    Studies/Results: MR LIVER W WO CONTRAST  Result Date: 10/13/2020 CLINICAL DATA:  70 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 Staging examination. Acute hepatic encephalopathy. EXAM: MRI ABDOMEN WITHOUT AND WITH CONTRAST TECHNIQUE: Multiplanar multisequence MR imaging of the abdomen was performed both before and after the administration of intravenous contrast. CONTRAST:  51mL GADAVIST GADOBUTROL 1 MMOL/ML IV SOLN COMPARISON:  Abdominal MRI 07/04/2020. FINDINGS: Lower chest: Small hiatal hernia.  Mild cardiomegaly. Hepatobiliary: Liver has a shrunken appearance and nodular contour, indicative of advanced cirrhosis. Diffuse loss of signal intensity throughout the hepatic parenchyma on in phase dual echo images and diffuse low T2 signal intensity throughout the hepatic parenchyma, indicative of extensive iron deposition. Numerous T1 and T2 hypointense nodules scattered throughout the hepatic parenchyma, most of which are subcentimeter in size and do not enhance. In addition, there are 2 larger lesions which are T1 hyperintense and generally T2 hypointense, without enhancement measuring 5.4 x 4.4 cm in segment 2 (axial image 29 of series 10/1) which is stable compared to the prior examination, and 5.8 x 3.9 cm predominantly in segments 5 and 6 (axial image 42 of series 10/1) which is increased in size compared  to the prior study (previously 5.3 x 3.2 cm). Both of these lesions demonstrates some degree of diffusion restriction. In segment 3 of the liver there is a T1 hypointense nonenhancing area (axial image 38 of series 1038) which corresponds to a previously noted lesion, likely a indicative of post embolization scarring. Additional lesion in segment 8 of the liver (axial image 34 of series 10/1) measuring 1.3 cm in diameter, stable in retrospect compared to the prior study, also mildly  T1 hyperintense, T2 hypointense without definite enhancement on post gadolinium imaging. Other previously noted smaller lesions are not confidently identified on today's examination. No intra or extrahepatic biliary ductal dilatation. Pancreas: Multiple tiny T1 hypointense, T2 hyperintense, nonenhancing lesions are noted throughout the pancreas, largest of which is in the tail of the pancreas measuring only 11 mm, similar in size and number to the prior examination, favored to represent small benign lesions such as pancreatic pseudocysts or side branch IPMNs (intraductal papillary mucinous neoplasms). No definite communication with the main pancreatic duct noted. No peripancreatic fluid collections. Spleen: Spleen is mildly enlarged measuring 11.1 x 6.9 x 10.9 cm (estimated splenic volume of 417 mL) . Adrenals/Urinary Tract: bilateral kidneys and adrenal glands are normal in appearance. No hydroureteronephrosis in the visualized portions of the abdomen. Stomach/Bowel: Visualized portions are unremarkable. Vascular/Lymphatic: Aortic atherosclerosis, without evidence of aneurysm in the visualized abdominal vasculature. Portal vein is patent and normal in caliber. No lymphadenopathy noted in the abdomen. Other:  Moderate to large volume of ascites. Musculoskeletal: No aggressive appearing osseous lesions are noted in the visualized portions of the skeleton. IMPRESSION: 1. 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 Many of the previously noted suspicious lesions have regressed or disappeared following prior embolization procedure. The other lesions which remain are generally stable compared to the prior study, with the exception of the lesion in the right lobe of the liver between segments 5 and 6 which is slightly larger than the prior study, but demonstrates no definitive internal enhancement. Continued attention on follow-up studies is recommended to ensure  stability or regression. 2. Moderate to large volume of ascites. 3. Multiple small cystic lesions scattered throughout the pancreas measuring 1.1 cm in size or less, stable compared to prior studies, likely to represent benign lesions as discussed above. Attention at time of routine follow-up is recommended to ensure continued stability. 4. Small hiatal hernia. 5. Mild cardiomegaly. Electronically Signed   By: Vinnie Langton M.D.   On: 10/13/2020 09:57   Scheduled Meds: . (feeding supplement) PROSource Plus  30 mL Oral BID BM  . camphor-menthol   Topical BID  . Chlorhexidine Gluconate Cloth  6 each Topical Q0600  . Chlorhexidine Gluconate Cloth  6 each Topical Q0600  . darbepoetin (ARANESP) injection - DIALYSIS  60 mcg Intravenous Q Sat-HD  . diltiazem  15 mg Oral Q12H  . feeding supplement  1 Container Oral TID BM  . feeding supplement  237 mL Oral TID BM  . lactulose  20 g Oral TID  . lidocaine      . midodrine  10 mg Oral TID  . multivitamin  1 tablet Oral QHS  . phytonadione  5 mg Oral Once  . rifaximin  550 mg Oral BID  . sodium chloride flush  10-40 mL Intracatheter Q12H   Continuous Infusions: . sodium chloride    . sodium chloride    . albumin human 25 g (10/14/20 0535)   PRN Meds:.sodium chloride, sodium  chloride, alteplase, haloperidol lactate, heparin, hydrOXYzine, lidocaine (PF), lidocaine, lidocaine-prilocaine, ondansetron **OR** ondansetron (ZOFRAN) IV, pentafluoroprop-tetrafluoroeth, sodium chloride flush   ASSESMENT:   *    cirrhosis of the liver.  Decompensated.  Discriminant function score 34 at admission.  Abstinent of alcohol since 01/2020.  *    hepatocellular carcinoma.  Treated with Y 92 and nivolumab 02/2020. MRI of liver yesterday 4/10 shows many of the lesions regressed or disappeared and others remain stable with the exception of slight enlargement of lesion in the right lobe.  *     hepatic encephalopathy. Currently management with lactulose PO  *      abdominal ascites.  Requiring frequent paracentesis.  Latest paracentesis were 4/7 and today 4/10.  No history of SBP. Every 6 hours albumin infusions beginning 4/6 through 1330 today  *    small cystic lesions in the pancreas.  Noted on yesterday's MRI  *    new ESRD.  Started hemodialysis on 4/6.  *     macrocytic anemia.  *     Thrombocytopenia.  *     Coagulopathy.  Received vitamin K x3, 1 dose po VIt K.    *     rapid A. fib.  Not anticoagulated.  *      staph hominis bacteremia.  On vancomycin, cefepime.   PLAN   *    From the standpoint of his encephalopathy, seems appropriate for discharge.  *   Suspect he is going to require frequent paracentesis, perhaps as frequently as every week going forward.  With his end-stage renal disease diuretics are not an option.  With his severe hepatic encephalopathy at presentation last week, TIPS is not an option.  *   Switched diet to renal with 1.2 L fluid restrictions and 2 g sodium restriction  *   Continue current dose of lactulose 20 g tid, rifaximin 550 mg bid.  Rifaximin was not a med for him in the past.  He may have difficulty accessing this medication as outpt due to its cost and insurance hurdles.    Azucena Freed  10/14/2020, 12:19 PM Phone [phone]

## 2020-10-14 NOTE — Procedures (Signed)
PROCEDURE SUMMARY:  Successful US guided paracentesis from left abdomen.  Yielded 4.6 L of clear yellow fluid.  No immediate complications.  Pt tolerated well.   EBL < 1 mL  Theresa Duty, NP 10/14/2020 1:04 PM

## 2020-10-14 NOTE — Progress Notes (Addendum)
HEMATOLOGY-ONCOLOGY PROGRESS NOTE  SUBJECTIVE: The patient just returned from ultrasound-guided paracentesis just prior to my visit.  He reports that he is feeling well.  He hopes to go home later today.  He offers no specific complaints.  Oncology History  Hepatocellular carcinoma ([place])  03/26/2020 Initial Diagnosis   Hepatocellular carcinoma ([place])   04/08/2020 -  Chemotherapy      Patient is on Antibody Plan: COLORECTAL NIVOLUMAB Q14D     PHYSICAL EXAMINATION:  Vitals:   10/14/20 0038 10/14/20 0339  BP: 110/70 107/84  Pulse:  (!) 104  Resp: 12 14  Temp: 98.8 F (37.1 C) 98.1 F (36.7 C)  SpO2: 97% 99%   Filed Weights   10/12/20 1500 10/13/20 0338 10/14/20 0339  Weight: 81.5 kg 80.9 kg 81.9 kg    Intake/Output from previous day: 04/10 0701 - 04/11 0700 In: 543.7 [P.O.:240; IV Piggyback:303.7] Out: 225 [Urine:225]  GENERAL: Chronically ill-appearing male, no distress EYES: Jaundice present LUNGS: clear to auscultation and percussion with normal breathing effort HEART: regular rate & rhythm and no murmurs and no lower extremity edema ABDOMEN: Positive bowel sounds, distended NEURO: Alert and oriented x3  LABORATORY DATA:  I have reviewed the data as listed CMP Latest Ref Rng & Units 10/14/2020 10/13/2020 10/12/2020  Glucose 70 - 99 mg/dL 120(H) 108(H) 121(H)  BUN 8 - 23 mg/dL 36(H) 23 31(H)  Creatinine 0.61 - 1.24 mg/dL 2.67(H) 2.27(H) 2.33(H)  Sodium 135 - 145 mmol/L 135 133(L) 133(L)  Potassium 3.5 - 5.1 mmol/L 3.6 3.6 3.4(L)  Chloride 98 - 111 mmol/L 98 96(L) 98  CO2 22 - 32 mmol/L 25 30 25   Calcium 8.9 - 10.3 mg/dL 9.3 9.3 9.0  Total Protein 6.5 - 8.1 g/dL 6.3(L) 6.6 6.7  Total Bilirubin 0.3 - 1.2 mg/dL 3.7(H) 4.2(H) 5.3(H)  Alkaline Phos 38 - 126 U/L 99 86 66  AST 15 - 41 U/L 63(H) 65(H) 74(H)  ALT 0 - 44 U/L 37 38 36    Lab Results  Component Value Date   WBC 6.1 10/14/2020   HGB 8.9 (L) 10/14/2020   HCT 26.6 (L) 10/14/2020   MCV 109.5 (H) 10/14/2020    PLT 68 (L) 10/14/2020   NEUTROABS 4.1 10/11/2020    CT Abdomen Pelvis Wo Contrast  Result Date: 10/09/2020 CLINICAL DATA:  Abdominal distension. 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 COMPARISON:  MRI from 07/04/2020 FINDINGS: Lower chest: Trace bilateral pleural effusions identified. Hepatobiliary: The liver appears cirrhotic. Treated tumor within segment 2 is measures 5.3 x 4.3 cm on today's exam, image 17/3. This is compared with 5.0 x 4.6 cm previously. The other known liver lesions are difficult to assess reflecting lack of IV contrast material. Sludge and tiny stones identified within the dependent portion of the gallbladder. Pancreas: Unremarkable. No pancreatic ductal dilatation or surrounding inflammatory changes. Spleen: Normal in size without focal abnormality. Adrenals/Urinary Tract: Normal appearance of the adrenal glands. Small bilateral renal calculi identified. No hydronephrosis. Urinary bladder is unremarkable. Stomach/Bowel: The stomach appears nondistended. Small bowel loops are unremarkable. Mild diffuse colonic wall thickening is nonspecific in the setting of ascites and portal venous hypertension. Vascular/Lymphatic: Aortic atherosclerosis. No aneurysm. Previous GDA embolization. Esophageal and upper abdominal varices. No abdominopelvic adenopathy. Reproductive: Prostate is unremarkable. Other: Large volume of ascites within the abdomen and pelvis. A left inguinal hernia is  identified which is filled with ascites as well as a nonobstructed loop of sigmoid colon. No discrete fluid collections identified. Musculoskeletal: Bilateral L5 pars defects identified. Mild anterolisthesis of L5 on S1 noted. IMPRESSION: 1. Large volume of ascites identified within the abdomen and pelvis. 2.  Cirrhosis with known multifocal hepatocellular carcinoma. Status post Y 90 microsphere ablation. Dominant lesion in segment 2 appears similar to 07/04/2020 3.  Aortic Atherosclerosis (ICD10-I70.0). 4. Gallstones. 5. Trace pleural effusions. Electronically Signed   By: Kerby Moors M.D.   On: 10/09/2020 13:49   CT Head Wo Contrast  Result Date: 10/09/2020 CLINICAL DATA:  Neurological deficit EXAM: CT HEAD WITHOUT CONTRAST TECHNIQUE: Contiguous axial images were obtained from the base of the skull through the vertex without intravenous contrast. COMPARISON:  Prior head CT 05/29/2020 FINDINGS: Brain: No evidence of acute infarction, hemorrhage, hydrocephalus, extra-axial collection or mass lesion/mass effect. Stable scattered foci of white matter hypoattenuation consistent with chronic microvascular ischemic white matter disease. Tiny bilateral cerebellar hemisphere lacunar infarcts are also unchanged. Mild cortical atrophy without interval progression. Vascular: No hyperdense vessel or unexpected calcification. Atherosclerotic vascular calcifications in the bilateral cavernous and supraclinoid carotid arteries. Skull: Normal. Negative for fracture or focal lesion. Sinuses/Orbits: No acute finding. Other: None. IMPRESSION: 1. No acute intracranial abnormality. 2. Stable cortical atrophy, chronic microvascular ischemic white matter disease and bilateral cerebellar hemisphere lacunar infarcts. Electronically Signed   By: Jacqulynn Cadet M.D.   On: 10/09/2020 11:00   MR LIVER W WO CONTRAST  Result Date: 10/13/2020 CLINICAL DATA:  70 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 Staging examination. Acute hepatic encephalopathy. EXAM: MRI ABDOMEN WITHOUT AND WITH CONTRAST TECHNIQUE: Multiplanar multisequence MR imaging of the abdomen was performed both before and after the administration of intravenous contrast. CONTRAST:  53mL GADAVIST GADOBUTROL 1 MMOL/ML IV SOLN COMPARISON:  Abdominal MRI 07/04/2020.  FINDINGS: Lower chest: Small hiatal hernia.  Mild cardiomegaly. Hepatobiliary: Liver has a shrunken appearance and nodular contour, indicative of advanced cirrhosis. Diffuse loss of signal intensity throughout the hepatic parenchyma on in phase dual echo images and diffuse low T2 signal intensity throughout the hepatic parenchyma, indicative of extensive iron deposition. Numerous T1 and T2 hypointense nodules scattered throughout the hepatic parenchyma, most of which are subcentimeter in size and do not enhance. In addition, there are 2 larger lesions which are T1 hyperintense and generally T2 hypointense, without enhancement measuring 5.4 x 4.4 cm in segment 2 (axial image 29 of series 10/1) which is stable compared to the prior examination, and 5.8 x 3.9 cm predominantly in segments 5 and 6 (axial image 42 of series 10/1) which is increased in size compared to the prior study (previously 5.3 x 3.2 cm). Both of these lesions demonstrates some degree of diffusion restriction. In segment 3 of the liver there is a T1 hypointense nonenhancing area (axial image 38 of series 1038) which corresponds to a previously noted lesion, likely a indicative of post embolization scarring. Additional lesion in segment 8 of the liver (axial image 34 of series 10/1) measuring 1.3 cm in diameter, stable in retrospect compared to the prior study, also mildly T1 hyperintense, T2 hypointense without definite enhancement on post gadolinium imaging. Other previously noted smaller lesions are not confidently identified on today's examination. No intra or extrahepatic biliary ductal dilatation. Pancreas: Multiple tiny T1 hypointense, T2 hyperintense, nonenhancing lesions are noted throughout the pancreas, largest of which is in the tail of the pancreas measuring only 11 mm, similar in size and number to the prior  examination, favored to represent small benign lesions such as pancreatic pseudocysts or side branch IPMNs (intraductal papillary  mucinous neoplasms). No definite communication with the main pancreatic duct noted. No peripancreatic fluid collections. Spleen: Spleen is mildly enlarged measuring 11.1 x 6.9 x 10.9 cm (estimated splenic volume of 417 mL) . Adrenals/Urinary Tract: bilateral kidneys and adrenal glands are normal in appearance. No hydroureteronephrosis in the visualized portions of the abdomen. Stomach/Bowel: Visualized portions are unremarkable. Vascular/Lymphatic: Aortic atherosclerosis, without evidence of aneurysm in the visualized abdominal vasculature. Portal vein is patent and normal in caliber. No lymphadenopathy noted in the abdomen. Other:  Moderate to large volume of ascites. Musculoskeletal: No aggressive appearing osseous lesions are noted in the visualized portions of the skeleton. IMPRESSION: 1. 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 Many of the previously noted suspicious lesions have regressed or disappeared following prior embolization procedure. The other lesions which remain are generally stable compared to the prior study, with the exception of the lesion in the right lobe of the liver between segments 5 and 6 which is slightly larger than the prior study, but demonstrates no definitive internal enhancement. Continued attention on follow-up studies is recommended to ensure stability or regression. 2. Moderate to large volume of ascites. 3. Multiple small cystic lesions scattered throughout the pancreas measuring 1.1 cm in size or less, stable compared to prior studies, likely to represent benign lesions as discussed above. Attention at time of routine follow-up is recommended to ensure continued stability. 4. Small hiatal hernia. 5. Mild cardiomegaly. Electronically Signed   By: Vinnie Langton M.D.   On: 10/13/2020 09:57   DG Chest Port 1 View  Result Date: 10/09/2020 CLINICAL DATA:  Altered mental status. History of hepatocellular carcinoma  EXAM: PORTABLE CHEST 1 VIEW COMPARISON:  May 22, 2020. FINDINGS: Note that a small amount of the lateral left base not included. Central catheter tip is in the right atrium. No pneumothorax. There is no edema or airspace opacity. Heart is mildly enlarged with pulmonary vascularity normal. No adenopathy. There is aortic atherosclerosis. No bone lesions. A focus of calcification is noted in the left carotid artery. IMPRESSION: Note that a small portion of the lateral left base not included. Visualized lungs clear. Stable cardiac prominence. Central catheter tip in right atrium without pneumothorax. Aortic Atherosclerosis (ICD10-I70.0). Focus of calcification noted in left carotid artery. Electronically Signed   By: Lowella Grip III M.D.   On: 10/09/2020 09:36   US LIVER DOPPLER  Result Date: 10/10/2020 CLINICAL DATA:  Cirrhosis of liver with ascites EXAM: DUPLEX ULTRASOUND OF LIVER TECHNIQUE: Color and duplex Doppler ultrasound was performed to evaluate the hepatic in-flow and out-flow vessels. COMPARISON:  CT abdomen/pelvis yesterday FINDINGS: Liver: Diffusely nodular liver contour. The liver appears small in the parenchyma is diffusely heterogeneous and echogenic. Hypoechoic soft tissue mass in the left upper lobe measures 4.8 x 4.4 x 5.0 cm. Hypoechoic solid mass in the right hepatic lobe measures approximately 4.9 x 5.7 x 8.3 cm. Diffusely thickened gallbladder wall. Sludge is present within the gallbladder lumen. The common bile duct is normal at 3 mm. Main Portal Vein size: 1.2 cm Portal Vein Velocities Main Prox:  41 cm/sec with hepatopetal directional flow. Main Mid: 55 cm/sec with hepatopetal directional flow. Main Dist:  71 cm/sec with hepatopetal directional flow. Right: Unable to visualize Left: 28 cm/sec with hepatopetal directional flow. Hepatic Vein Velocities Right:  27 cm/sec Middle:  32 cm/sec Left:  68 cm/sec  IVC: Present and patent with normal respiratory phasicity. Hepatic Artery  Velocity:  109 cm/sec Splenic Vein Velocity:  34 cm/sec Spleen: 8.4 cm x 12.0 cm x 8.2 cm with a total volume of 436 cm^3 (411 cm^3 is upper limit normal) Portal Vein Occlusion/Thrombus: No Splenic Vein Occlusion/Thrombus: No Ascites: None Varices: Moderate ascites. IMPRESSION: 1. 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 2. Portal veins remain patent with hepatopetal directional flow. 3. Previously treated left hepatic mass measures approximately 4.8 x 4.4 x 5.0 cm. Viability of the lesion cannot be assessed with ultrasound. 4. Right hepatic mass measuring approximately 4.9 x 5.7 x 8.3 cm not identified on prior imaging. Recommend further evaluation with gadolinium-enhanced MRI of the abdomen when the patient's clinical status is improved, preferably as an outpatient to facilitate optimal imaging quality and diagnostic sensitivity. Electronically Signed   By: Jacqulynn Cadet M.D.   On: 10/10/2020 07:45   IR Paracentesis  Result Date: 10/10/2020 INDICATION: History of multifocal hepatocellular cancer. Recurrent ascites. Request for diagnostic and therapeutic paracentesis. EXAM: ULTRASOUND GUIDED LEFT LOWER QUADRANT PARACENTESIS MEDICATIONS: 1% plain lidocaine, 5 mL COMPLICATIONS: None immediate. PROCEDURE: Informed written consent was obtained from the patient after a discussion of the risks, benefits and alternatives to treatment. A timeout was performed prior to the initiation of the procedure. Initial ultrasound scanning demonstrates a large amount of ascites within the left lower abdominal quadrant. The left lower abdomen was prepped and draped in the usual sterile fashion. 1% lidocaine was used for local anesthesia. Following this, a 19 gauge, 7-cm, Yueh catheter was introduced. An ultrasound image was saved for documentation purposes. The paracentesis was performed. The catheter was removed and a dressing was applied. The patient tolerated the procedure well  without immediate post procedural complication. FINDINGS: A total of approximately 5.8 L of clear yellow fluid was removed. Samples were sent to the laboratory as requested by the clinical team. IMPRESSION: Successful ultrasound-guided paracentesis yielding 5.8 liters of peritoneal fluid. Read by: Ascencion Dike PA-C Electronically Signed   By: Sandi Mariscal M.D.   On: 10/10/2020 15:40   IR Paracentesis  Result Date: 10/07/2020 INDICATION: Patient with history of cirrhosis, recurrent ascites. Request is made for therapeutic paracentesis of up to 3 L maximum. EXAM: ULTRASOUND GUIDED THERAPEUTIC PARACENTESIS MEDICATIONS: 10 mL 1% lidocaine COMPLICATIONS: None immediate. PROCEDURE: Informed written consent was obtained from the patient after a discussion of the risks, benefits and alternatives to treatment. A timeout was performed prior to the initiation of the procedure. Initial ultrasound scanning demonstrates a large amount of ascites within the right lower abdominal quadrant. The right lower abdomen was prepped and draped in the usual sterile fashion. 1% lidocaine was used for local anesthesia. Following this, a 19 gauge, 7-cm, Yueh catheter was introduced. An ultrasound image was saved for documentation purposes. The paracentesis was performed. The catheter was removed and a dressing was applied. The patient tolerated the procedure well without immediate post procedural complication. FINDINGS: A total of approximately 3.0 liters of yellow fluid was removed. IMPRESSION: Successful ultrasound-guided therapeutic paracentesis yielding 3.0 liters of peritoneal fluid. Read by: Brynda Greathouse PA-C Electronically Signed   By: Aletta Edouard M.D.   On: 10/07/2020 13:19    ASSESSMENT AND PLAN: 1. Hepatocellular carcinoma ? Multiple Li-Rad 5 liver lesions noted on a CT abdomen/pelvis 01/02/2020 including a 7.1 x 5.7 cm segment 2 lesion, there are right and left hepatic lesions ? CT abdomen/pelvis 01/02/2020-cirrhosis  with varices, multiple LR 5 liver lesions,  1.1 cm fluid density lesion in the pancreas head, splenomegaly, bilateral kidney stones, small amount of right perihepatic ascites ? MRI liver 02/08/2020-cirrhosis, numerous hyperenhancing liver lesions, largest lesion in segment two measuring 7.9 x 6.1 cm, lesions are consistent with multifocal hepatocellular carcinoma, the three largest lesions are categorized as LI-RADS5. 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 Y90 left hepatic lobe 03/01/2020 ? Cycle 1 nivolumab 04/08/2020 ? Cycle 2 nivolumab 04/22/2020 ? Cycle 3 nivolumab 05/13/2020 ? Cycle 4 nivolumab 05/27/2020 ? Cycle 5 nivolumab 06/11/2020 ? MRI liver 07/04/2020-cirrhosis with portal venous hypertension and ascites. Decrease in size and enhancement associated with treated left liver lesions, segment 6 lesion mildly increased in size, decreased size of segment 5 lesion ? Cycle 6 nivolumab 07/09/2020 ? Tumor board 07/17/2020-not a candidate for further Y90, continue nivolumab ? Cycle 7 nivolumab 07/23/2020 ? Cycle 8 nivolumab 08/06/2020 ? Cycle 9 nivolumab 08/20/2020 ? Cycle 10 nivolumab 09/03/2020 2. 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 Hypertension 4. BPH 5. Hyperlipidemia 6. History of heavy alcohol use 7. Remote history of tobacco use 8. Renal insufficiency-progressive, hemodialysis started 10/08/2020 9. Microscopic hematuria 10. 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 Esophageal mucosal changes consistent with long segment Barrett's esophagus present in the distal esophagus. Severe portal hypertensive gastropathy found in the entire examined stomach. Small hiatal hernia. 11. Mild right hydronephrosis with a probable calculus in the  proximal third of the right ureter on the MRI 03/06/2020, renal ultrasound 04/01/2020-multiple nonobstructing bilateral renal calculi  05/22/2020-laser lithotripsy, bilateral ureter stent placement 12. Atrial fibrillation-not on anticoagulation therapy 13. Dizziness-improved with discontinuation of Flomax. Not orthostatic 06/11/2020 14. Skin rash with pruritus 08/06/2020-likely a nivolumab rash, trial of hydrocortisone cream 15.  Admission 10/09/2020 with hepatic encephalopathy 16.  Anemia secondary to chronic disease, renal failure, and phlebotomy  Mr. Tilly appears improved.  Mental status is now back to baseline.  His altered mental status was likely due to hepatic encephalopathy.  He had an MRI of the liver performed this weekend which was reviewed with the patient and his wife.  The lesions treated previously with Y 90 have regressed or disappeared.  The lesions that remain are generally stable with the exception of a lesion on the right lobe of the liver to slightly larger.  For now, we will plan to hold nivolumab until his performance status improves.  We will reevaluate him as an outpatient to discuss further treatment options.  Discussed with the patient and his wife that he is not a candidate for liver transplantation.  The patient is status post paracentesis on 4/7 and again today on 4/11.  Fluid from paracentesis on 4/7 was sent for cytology which showed atypical cells.  Recommendations: 1.  We plan to hold nivolumab until performance status improves. 2.  We will reevaluate as an outpatient to discuss additional treatment options.  The patient may discharge to home from our standpoint if otherwise medically stable.  We will arrange for outpatient follow-up.   LOS: 5 days   Dillon Bussing, DNP, AGPCNP-BC, AOCNP 10/14/20  Dillon Perkins was interviewed and examined.  He is alert and oriented today.  His wife was present by telephone when I saw him early this morning.  We reviewed  results from the restaging MRI.  I reviewed the MRI images.  The MRI reveals an overall stable liver tumor burden other than a slightly large dominant lesion in the right liver.  Dillon Perkins asked about a liver transplant.  He is not a transplant candidate secondary to the advanced cirrhosis, multifocal hepatocellular carcinoma, and renal failure.  I recommend observation for the cancer.  He is chiefly symptomatic from hepatic encephalopathy and renal failure.  He is not a candidate for Y 90 embolization of the right liver due to the hyperbilirubinemia.  We will arrange for outpatient follow-up at the Cancer center within the next 2-3 weeks.  I was present for greater than 50% of today's visit.  I performed medical decision making.

## 2020-10-14 NOTE — Progress Notes (Signed)
[facility] Progress Note   Subjective:  Seen in room - says feels well and hoping for discharge soon. No CP or dyspnea.   Objective Vitals:   10/13/20 2215 10/14/20 0038 10/14/20 0339 10/14/20 0745  BP:  110/70 107/84   Pulse:   (!) 104 (!) 102  Resp: 20 12 14 18   Temp:  98.8 F (37.1 C) 98.1 F (36.7 C) 98 F (36.7 C)  TempSrc:  Oral Oral Oral  SpO2:  97% 99%   Weight:   81.9 kg   Height:       Physical Exam General: Chronically ill appearing man, NAD. Room air. Heart: RRR; no murmur Lungs: CTA anteriorly Abdomen: soft, distended Extremities: No LE edema Dialysis Access:  R IJ TDC with mild blood around exit site, non-tender  Additional Objective Labs: Basic Metabolic Panel: Recent Labs  Lab 10/09/20 2023 10/10/20 0547 10/10/20 1254 10/11/20 0554 10/12/20 0222 10/13/20 0958 10/14/20 0250  NA 133*   < >  --    < > 133* 133* 135  K 4.8   < >  --    < > 3.4* 3.6 3.6  CL 100   < >  --    < > 98 96* 98  CO2 21*   < >  --    < > 25 30 25   GLUCOSE 113*   < >  --    < > 121* 108* 120*  BUN 74*   < >  --    < > 31* 23 36*  CREATININE 3.21*   < >  --    < > 2.33* 2.27* 2.67*  CALCIUM 8.7*   < >  --    < > 9.0 9.3 9.3  PHOS 4.9*  --  3.3  --   --   --   --    < > = values in this interval not displayed.   Liver Function Tests: Recent Labs  Lab 10/12/20 0222 10/13/20 0958 10/14/20 0250  AST 74* 65* 63*  ALT 36 38 37  ALKPHOS 66 86 99  BILITOT 5.3* 4.2* 3.7*  PROT 6.7 6.6 6.3*  ALBUMIN 3.7 3.7 3.6   Recent Labs  Lab 10/09/20 1055  LIPASE 68*   CBC: Recent Labs  Lab 10/09/20 1055 10/09/20 1412 10/10/20 0547 10/11/20 0554 10/11/20 1431 10/12/20 0222 10/13/20 0958 10/14/20 0250  WBC 7.2   < > 6.9 5.3 6.0 5.9 6.4 6.1  NEUTROABS 6.2  --  5.5 4.1  --   --   --   --   HGB 10.1*   < > 9.1* 7.5* 8.5* 8.2* 9.1* 8.9*  HCT 30.1*   < > 26.6* 23.1* 26.1* 24.3* 27.6* 26.6*  MCV 106.4*   < > 106.0* 108.5* 108.8* 108.5* 108.7* 109.5*  PLT 106*    < > 91* 63* 60* 65* 62* 68*   < > = values in this interval not displayed.   Blood Culture    Component Value Date/Time   SDES FLUID ABDOMEN PERITONEAL 10/10/2020 1510   SDES FLUID PERITONEAL ABDOMEN 10/10/2020 1510   SPECREQUEST NONE 10/10/2020 1510   SPECREQUEST NONE 10/10/2020 1510   CULT  10/10/2020 1510    NO GROWTH 4 DAYS Performed at [facility], [place] [address], [place], [place] [address]    REPTSTATUS 10/11/2020 FINAL 10/10/2020 1510   REPTSTATUS PENDING 10/10/2020 1510   Studies/Results: MR LIVER W WO CONTRAST  Result Date: 10/13/2020 CLINICAL DATA:  70 year old male  with history of hepatobiliary cancer. Staging examination. Acute hepatic encephalopathy. EXAM: MRI ABDOMEN WITHOUT AND WITH CONTRAST TECHNIQUE: Multiplanar multisequence MR imaging of the abdomen was performed both before and after the administration of intravenous contrast. CONTRAST:  75mL GADAVIST GADOBUTROL 1 MMOL/ML IV SOLN COMPARISON:  Abdominal MRI 07/04/2020. FINDINGS: Lower chest: Small hiatal hernia.  Mild cardiomegaly. Hepatobiliary: Liver has a shrunken appearance and nodular contour, indicative of advanced cirrhosis. Diffuse loss of signal intensity throughout the hepatic parenchyma on in phase dual echo images and diffuse low T2 signal intensity throughout the hepatic parenchyma, indicative of extensive iron deposition. Numerous T1 and T2 hypointense nodules scattered throughout the hepatic parenchyma, most of which are subcentimeter in size and do not enhance. In addition, there are 2 larger lesions which are T1 hyperintense and generally T2 hypointense, without enhancement measuring 5.4 x 4.4 cm in segment 2 (axial image 29 of series 10/1) which is stable compared to the prior examination, and 5.8 x 3.9 cm predominantly in segments 5 and 6 (axial image 42 of series 10/1) which is increased in size compared to the prior study (previously 5.3 x 3.2 cm). Both of these lesions demonstrates some degree of  diffusion restriction. In segment 3 of the liver there is a T1 hypointense nonenhancing area (axial image 38 of series 1038) which corresponds to a previously noted lesion, likely a indicative of post embolization scarring. Additional lesion in segment 8 of the liver (axial image 34 of series 10/1) measuring 1.3 cm in diameter, stable in retrospect compared to the prior study, also mildly T1 hyperintense, T2 hypointense without definite enhancement on post gadolinium imaging. Other previously noted smaller lesions are not confidently identified on today's examination. No intra or extrahepatic biliary ductal dilatation. Pancreas: Multiple tiny T1 hypointense, T2 hyperintense, nonenhancing lesions are noted throughout the pancreas, largest of which is in the tail of the pancreas measuring only 11 mm, similar in size and number to the prior examination, favored to represent small benign lesions such as pancreatic pseudocysts or side branch IPMNs (intraductal papillary mucinous neoplasms). No definite communication with the main pancreatic duct noted. No peripancreatic fluid collections. Spleen: Spleen is mildly enlarged measuring 11.1 x 6.9 x 10.9 cm (estimated splenic volume of 417 mL) . Adrenals/Urinary Tract: bilateral kidneys and adrenal glands are normal in appearance. No hydroureteronephrosis in the visualized portions of the abdomen. Stomach/Bowel: Visualized portions are unremarkable. Vascular/Lymphatic: Aortic atherosclerosis, without evidence of aneurysm in the visualized abdominal vasculature. Portal vein is patent and normal in caliber. No lymphadenopathy noted in the abdomen. Other:  Moderate to large volume of ascites. Musculoskeletal: No aggressive appearing osseous lesions are noted in the visualized portions of the skeleton. IMPRESSION: 1. 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 Many of the previously noted suspicious lesions have regressed  or disappeared following prior embolization procedure. The other lesions which remain are generally stable compared to the prior study, with the exception of the lesion in the right lobe of the liver between segments 5 and 6 which is slightly larger than the prior study, but demonstrates no definitive internal enhancement. Continued attention on follow-up studies is recommended to ensure stability or regression. 2. Moderate to large volume of ascites. 3. Multiple small cystic lesions scattered throughout the pancreas measuring 1.1 cm in size or less, stable compared to prior studies, likely to represent benign lesions as discussed above. Attention at time of routine follow-up is recommended to ensure continued stability. 4. Small hiatal hernia.  5. Mild cardiomegaly. Electronically Signed   By: Vinnie Langton M.D.   On: 10/13/2020 09:57   Medications: . sodium chloride    . sodium chloride    . albumin human 25 g (10/14/20 0535)  . ceFEPime (MAXIPIME) IV 1 g (10/13/20 1130)   . (feeding supplement) PROSource Plus  30 mL Oral BID BM  . camphor-menthol   Topical BID  . Chlorhexidine Gluconate Cloth  6 each Topical Q0600  . Chlorhexidine Gluconate Cloth  6 each Topical Q0600  . darbepoetin (ARANESP) injection - DIALYSIS  60 mcg Intravenous Q Sat-HD  . diltiazem  15 mg Oral Q12H  . feeding supplement  1 Container Oral TID BM  . feeding supplement  237 mL Oral TID BM  . lactulose  20 g Oral TID  . midodrine  10 mg Oral TID  . multivitamin  1 tablet Oral QHS  . phytonadione  5 mg Oral Once  . rifaximin  550 mg Oral BID  . sodium chloride flush  10-40 mL Intracatheter Q12H    Dialysis Orders: [facility]TS at [facility] -- brand new, only had 1 treatment there on 4/5, establishing orders 2.5hr today -> 3hr -> 3.5hr. EDW ~87kg, 2K/2.5Ca, TDC, no heparin  Assessment/Plan: 1.Sepsis/Bacteremia: BCx 4/6 grew S. capitus in 1 of 2 bottles, ?contaminant. On Vanc/Cefepime.Repeat paracentesis with yield 5.8L on 4/7.  Peritoneal fluid culture with NGTD. 2. Hepatic encephalopathy/decompensated liver failure: Hx cirrhosis/HCC s/p immunotherapy: Ammonia 190 on admit --> trending down. S/p Vit K. On lactulose and rifaximin, per primary.MRI 4/9 showed large volume ascites and regression of previous suspicious lesions post embolization and stable known lesions except one in right lobe which is slightly larger. For repeat paracentesis soon, possibly today. 3. A-Fib UJW:[id number] short-term diltiazem drip, now on amiodarone and short-acting PO diltiazem. No anticoagulation.  4. ESRD:New start -> titrating up dialysis Rx. TTS schedule - next tomorrow, either here or at his OP unit if he is ready for discharge. 5.HTN/volume:BP low/stable, on midodrine 10mg  TID - UF as tolerated with HD. 6. Anemiaof ESRD:Hgb8.9. Tsat 99% with iron deposits noted in the liver -> NO IRON.Continue Aranesp 54mcgq Sat.Transfuse prn. 7. Secondary hyperparathyroidism:Ca/Phos ok. No meds needed as of yet. 8. Nutrition:Alb3.7, continue protein supplements. 9. Dispo - Patient DNR.  Veneta Penton, PA-C 10/14/2020, 9:32 AM  [employer]

## 2020-10-14 NOTE — Progress Notes (Signed)
PT Cancellation Note  Patient Details Name: Dillon Perkins MRN: [id number] DOB: 09-25-1950   Cancelled Treatment:    Reason Eval/Treat Not Completed: Patient at procedure or test/unavailable. Will follow-up as schedule permits.  Mabeline Caras, PT, DPT Acute Rehabilitation Services  Pager [phone] Office [place] 10/14/2020, 11:36 AM

## 2020-10-14 NOTE — Progress Notes (Signed)
Nutrition Follow-up  DOCUMENTATION CODES:   Non-severe (moderate) malnutrition in context of chronic illness  INTERVENTION:  Provide Ensure Enlive po QID, each supplement provides 350 kcal and 20 grams of protein  Provide 30 ml Prosource plus po TID, each supplement provides 100 kcal and 15 grams of protein.   Encourage adequate PO intake.   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) as evidenced by moderate fat depletion,severe fat depletion,mild muscle depletion,moderate muscle depletion,severe muscle depletion,energy intake < 75% for > or equal to 1 month,percent weight loss; ongoing  GOAL:   Patient will meet greater than or equal to 90% of their needs; progressing  MONITOR:   PO intake,Supplement acceptance,Skin,Weight trends,Labs,I & O's  REASON FOR ASSESSMENT:   Malnutrition Screening Tool    ASSESSMENT:   70 yo male with a PMH of hepatocellular carcinoma s/p radio embolization, immunotherapy with Nivolumab x10 cycles last cycle was September 04, 4079, alcoholic cirrhosis with portal hypertension, PAF, GERD, HLD, HTN, and CKD stage 5 starting dialysis who presents with hepatic encephalopathy and sepsis.  4/4 - PTA paracentesis with outpt GI - removed 3 L of yellow fluid 4/5 - PTA first dialysis, tolerated well 4/11 s/p paracentesis   Diet advanced to regular solid texture currently on a 2 gram sodium diet. MD recommend 1.2 L fluid restriction. Pt underwent paracentesis today with 4.6 L removal. PO intake has been poor, however pt currently has nutritional supplements and have been consuming them well. Noted pt favors Ensure over Boost Breeze. RD to modify orders and order Ensure QID. Will additionally order Prosource TID to aid in increase protein needs. Possible plans for discharge home today. Recommend continuation of nutritional supplements post discharge for adequate nutrition.   Labs and  medications reviewed.   Diet Order:   Diet Order            Diet 2 gram sodium Room service appropriate? Yes; Fluid consistency: Thin  Diet effective now       "And" Linked Group Details            EDUCATION NEEDS:   Education needs have been addressed  Skin:  Skin Assessment: Reviewed RN Assessment  Last BM:  4/11  Height:   Ht Readings from Last 1 Encounters:  10/09/20 5\' 8"  (1.727 m)    Weight:   Wt Readings from Last 1 Encounters:  10/14/20 81.9 kg    BMI:  Body mass index is 27.44 kg/m.  Estimated Nutritional Needs:   Kcal:  1950-2150  Protein:  95-110 grams  Fluid:  1.2 L/day  Corrin Parker, MS, RD, LDN RD pager number/after hours weekend pager number on Amion.

## 2020-10-14 NOTE — Plan of Care (Signed)

## 2020-10-14 NOTE — Progress Notes (Signed)
Physical Therapy Treatment Patient Details Name: Dillon Perkins MRN: [id number] DOB: [birth year] Today's Date: 10/14/2020    History of Present Illness Pt is a 70 y.o. male admitted 10/09/20 with AMS; wife reports lethargy and poor appetite the past month. Head CT negative for acute abnormality. Abdominal CT with large volume ascites, cirrhosis. Workup for acute hepatic encephalopathy, afib with RVR. Per oncology, pt has likely developed hepatorenal syndrome due to worsening HCC. Pt hypotensive during HD 4/7. S/p paracentesis 4/7 and 4/11. PMH includes hepatocellular carcinoma (s/p radioembolization; immunotherapy), alcoholic cirrhosis w/ portal HTN, PAF, CKD V (recently started HD TTS).   PT Comments    Pt progressing well with mobility. Today's session focused on gait and stair training; pt able to mobilize well with supervision for safety; no overt balance deficits with higher level tasks. Pt motivated to participate and hopeful for d/c home. Pt's DME already delivered to room. Reviewed educ re: activity recommendations, DME use, fall risk reduction. Pt reports no further questions or concerns.  HR up to 141 with activity    Follow Up Recommendations  Home health PT     Equipment Recommendations   (already delivered)    Recommendations for Other Services       Precautions / Restrictions Precautions Precautions: Fall;Other (comment) Precaution Comments: Watch HR Restrictions Weight Bearing Restrictions: No    Mobility  Bed Mobility Overal bed mobility: Modified Independent Bed Mobility: Supine to Sit;Sit to Supine           General bed mobility comments: HOB slightly elevated    Transfers Overall transfer level: Independent Equipment used: None Transfers: Sit to/from Stand              Ambulation/Gait Ambulation/Gait assistance: Scientist, forensic (Feet): 400 Feet Assistive device: Rolling walker (2 wheeled) Gait Pattern/deviations: Step-through  pattern;Decreased stride length Gait velocity: Decreased Gait velocity interpretation: 1.31 - 2.62 ft/sec, indicative of limited community ambulator General Gait Details: Slow, mostly steady gait without DME, supervision for safety; pt able to carry his catheter bag; pt able to self-correct a couple bouts of drifting R/L without cues   Stairs Stairs: Yes Stairs assistance: Supervision Stair Management: One rail Right;Alternating pattern;Forwards Number of Stairs: 4 General stair comments: Ascend/descended 4 steps with single UE rail support, supervision for safety/lines   Wheelchair Mobility    Modified Rankin (Stroke Patients Only)       Balance Overall balance assessment: Needs assistance   Sitting balance-Leahy Scale: Good       Standing balance-Leahy Scale: Good               High level balance activites: Side stepping;Backward walking;Direction changes;Turns;Sudden stops;Head turns High Level Balance Comments: No overt instability or LOB with higher level balance tasks            Cognition Arousal/Alertness: Awake/alert Behavior During Therapy: WFL for tasks assessed/performed Overall Cognitive Status: No family/caregiver present to determine baseline cognitive functioning Area of Impairment: Memory                     Memory: Decreased short-term memory         General Comments: WFL for simple tasks; pt with with some repetition and potential short-term memory deficits; suspect near baseline cognition. Improved insight into current deficits      Exercises      General Comments General comments (skin integrity, edema, etc.): HR briefly up to 141 with ambulation. Pt preparing for d/c home today - reviewed educ re:  DME use, activity recommendations, fall risk reduction      Pertinent Vitals/Pain Pain Assessment: No/denies pain    Home Living                      Prior Function            PT Goals (current goals can now be  found in the care plan section) Progress towards PT goals: Progressing toward goals    Frequency    Min 3X/week      PT Plan Current plan remains appropriate    Co-evaluation              AM-PAC PT "6 Clicks" Mobility   Outcome Measure  Help needed turning from your back to your side while in a flat bed without using bedrails?: None Help needed moving from lying on your back to sitting on the side of a flat bed without using bedrails?: None Help needed moving to and from a bed to a chair (including a wheelchair)?: None Help needed standing up from a chair using your arms (e.g., wheelchair or bedside chair)?: None Help needed to walk in hospital room?: A Little Help needed climbing 3-5 steps with a railing? : A Little 6 Click Score: 22    End of Session Equipment Utilized During Treatment: Gait belt Activity Tolerance: Patient tolerated treatment well Patient left: in bed;with call bell/phone within reach Nurse Communication: Mobility status PT Visit Diagnosis: Other abnormalities of gait and mobility (R26.89);Muscle weakness (generalized) (M62.81)     Time: 9449-6759 PT Time Calculation (min) (ACUTE ONLY): 19 min  Charges:  $Gait Training: 8-22 mins                     Mabeline Caras, PT, DPT Acute Rehabilitation Services  Pager [phone] Office [place] 10/14/2020, 3:33 PM

## 2020-10-14 NOTE — Progress Notes (Signed)
Progress Note  Patient Name: Dillon Perkins Date of Encounter: 10/14/2020  [facility] Cardiologist: Minus Breeding, MD   Subjective   Palliative care notes reviewed.  End-stage renal disease, liver disease, encephalopathic on arrival.  Challenging to control A. fib.  Hypotension.  Inpatient Medications    Scheduled Meds: . (feeding supplement) PROSource Plus  30 mL Oral BID BM  . camphor-menthol   Topical BID  . Chlorhexidine Gluconate Cloth  6 each Topical Q0600  . Chlorhexidine Gluconate Cloth  6 each Topical Q0600  . darbepoetin (ARANESP) injection - DIALYSIS  60 mcg Intravenous Q Sat-HD  . diltiazem  15 mg Oral Q12H  . feeding supplement  1 Container Oral TID BM  . feeding supplement  237 mL Oral TID BM  . lactulose  20 g Oral TID  . midodrine  10 mg Oral TID  . multivitamin  1 tablet Oral QHS  . phytonadione  5 mg Oral Once  . rifaximin  550 mg Oral BID  . sodium chloride flush  10-40 mL Intracatheter Q12H   Continuous Infusions: . sodium chloride    . sodium chloride    . albumin human 25 g (10/14/20 0535)  . ceFEPime (MAXIPIME) IV 1 g (10/13/20 1130)   PRN Meds: sodium chloride, sodium chloride, alteplase, haloperidol lactate, heparin, hydrOXYzine, lidocaine (PF), lidocaine-prilocaine, ondansetron **OR** ondansetron (ZOFRAN) IV, pentafluoroprop-tetrafluoroeth, sodium chloride flush   Vital Signs    Vitals:   10/13/20 2215 10/14/20 0038 10/14/20 0339 10/14/20 0745  BP:  110/70 107/84   Pulse:   (!) 104 (!) 102  Resp: _0 Temp:  98.8 F (37.1 C) 98.1 F (36.7 C) 98 F (36.7 C)  TempSrc:  Oral Oral Oral  SpO2:  97% 99%   Weight:   81.9 kg   Height:        Intake/Output Summary (Last 24 hours) at 10/14/2020 0820 Last data filed at 10/14/2020 0600 Gross per 24 hour  Intake 543.69 ml  Output 225 ml  Net 318.69 ml   Last 3 Weights 10/14/2020 10/13/2020 10/12/2020  Weight (lbs) 180 lb 8 oz 178 lb 4.8 oz 179 lb 10.8 oz  Weight (kg) 81.874 kg  80.876 kg 81.5 kg      Telemetry    Heart rate up to the 130s with atrial fibrillation, during rest 105- Personally Reviewed  ECG    Atrial fibrillation with rapid ventricular response- Personally Reviewed  Physical Exam   GEN: No acute distress.  Currently sitting on commode, answering questions coherently Neck: No JVD Cardiac:  Irregularly irregular, no murmurs, rubs, or gallops.  Respiratory:  Normal respiratory effort MS:  No deformity. Neuro:  Nonfocal  Psych: Normal affect   Labs    High Sensitivity Troponin:  No results for input(s): TROPONINIHS in the last 720 hours.    Chemistry Recent Labs  Lab 10/12/20 0222 10/13/20 0958 10/14/20 0250  NA 133* 133* 135  K 3.4* 3.6 3.6  CL 98 96* 98  CO2 _1 GLUCOSE 121* 108* 120*  BUN 31* 23 36*  CREATININE 2.33* 2.27* 2.67*  CALCIUM 9.0 9.3 9.3  PROT 6.7 6.6 6.3*  ALBUMIN 3.7 3.7 3.6  AST 74* 65* 63*  ALT 36 38 37  ALKPHOS 66 86 99  BILITOT 5.3* 4.2* 3.7*  GFRNONAA 29* 30* 25*  ANIONGAP _2 Hematology Recent Labs  Lab 10/12/20 0222 10/13/20 0958 10/14/20 0250  WBC 5.9 6.4 6.1  RBC 2.24* 2.54* 2.43*  HGB 8.2* 9.1* 8.9*  HCT 24.3* 27.6* 26.6*  MCV 108.5* 108.7* 109.5*  MCH 36.6* 35.8* 36.6*  MCHC 33.7 33.0 33.5  RDW 17.2* 16.9* 17.2*  PLT 65* 62* 68*    BNPNo results for input(s): BNP, PROBNP in the last 168 hours.   DDimer No results for input(s): DDIMER in the last 168 hours.   Radiology    MR LIVER W WO CONTRAST  Result Date: 10/13/2020 CLINICAL DATA:  70 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 Staging examination. Acute hepatic encephalopathy. EXAM: MRI ABDOMEN WITHOUT AND WITH CONTRAST TECHNIQUE: Multiplanar multisequence MR imaging of the abdomen was performed both before and after the administration of intravenous contrast. CONTRAST:  13m GADAVIST GADOBUTROL 1 MMOL/ML IV SOLN COMPARISON:  Abdominal MRI 07/04/2020. FINDINGS: Lower chest: Small hiatal hernia.  Mild  cardiomegaly. Hepatobiliary: Liver has a shrunken appearance and nodular contour, indicative of advanced cirrhosis. Diffuse loss of signal intensity throughout the hepatic parenchyma on in phase dual echo images and diffuse low T2 signal intensity throughout the hepatic parenchyma, indicative of extensive iron deposition. Numerous T1 and T2 hypointense nodules scattered throughout the hepatic parenchyma, most of which are subcentimeter in size and do not enhance. In addition, there are 2 larger lesions which are T1 hyperintense and generally T2 hypointense, without enhancement measuring 5.4 x 4.4 cm in segment 2 (axial image 29 of series 10/1) which is stable compared to the prior examination, and 5.8 x 3.9 cm predominantly in segments 5 and 6 (axial image 42 of series 10/1) which is increased in size compared to the prior study (previously 5.3 x 3.2 cm). Both of these lesions demonstrates some degree of diffusion restriction. In segment 3 of the liver there is a T1 hypointense nonenhancing area (axial image 38 of series 1038) which corresponds to a previously noted lesion, likely a indicative of post embolization scarring. Additional lesion in segment 8 of the liver (axial image 34 of series 10/1) measuring 1.3 cm in diameter, stable in retrospect compared to the prior study, also mildly T1 hyperintense, T2 hypointense without definite enhancement on post gadolinium imaging. Other previously noted smaller lesions are not confidently identified on today's examination. No intra or extrahepatic biliary ductal dilatation. Pancreas: Multiple tiny T1 hypointense, T2 hyperintense, nonenhancing lesions are noted throughout the pancreas, largest of which is in the tail of the pancreas measuring only 11 mm, similar in size and number to the prior examination, favored to represent small benign lesions such as pancreatic pseudocysts or side branch IPMNs (intraductal papillary mucinous neoplasms). No definite communication with  the main pancreatic duct noted. No peripancreatic fluid collections. Spleen: Spleen is mildly enlarged measuring 11.1 x 6.9 x 10.9 cm (estimated splenic volume of 417 mL) . Adrenals/Urinary Tract: bilateral kidneys and adrenal glands are normal in appearance. No hydroureteronephrosis in the visualized portions of the abdomen. Stomach/Bowel: Visualized portions are unremarkable. Vascular/Lymphatic: Aortic atherosclerosis, without evidence of aneurysm in the visualized abdominal vasculature. Portal vein is patent and normal in caliber. No lymphadenopathy noted in the abdomen. Other:  Moderate to large volume of ascites. Musculoskeletal: No aggressive appearing osseous lesions are noted in the visualized portions of the skeleton. IMPRESSION: 1. 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 Many of the previously noted suspicious lesions have regressed or disappeared following prior embolization procedure. The other lesions which remain are generally stable compared to the prior study, with the exception of the lesion in the right lobe  of the liver between segments 5 and 6 which is slightly larger than the prior study, but demonstrates no definitive internal enhancement. Continued attention on follow-up studies is recommended to ensure stability or regression. 2. Moderate to large volume of ascites. 3. Multiple small cystic lesions scattered throughout the pancreas measuring 1.1 cm in size or less, stable compared to prior studies, likely to represent benign lesions as discussed above. Attention at time of routine follow-up is recommended to ensure continued stability. 4. Small hiatal hernia. 5. Mild cardiomegaly. Electronically Signed   By: Vinnie Langton M.D.   On: 10/13/2020 09:57    Cardiac Studies   Echocardiogram 11/10/2018 -EF 55 to 60% mild MAC  Patient Profile     70 y.o. male with end-stage renal disease on hemodialysis, alcoholic cirrhosis  with hepatocellular carcinoma, DNR, chronic hypotension on midodrine, palliative care consultation, persistent atrial fibrillation with rapid ventricular response.  Assessment & Plan    Persistent atrial fibrillation with rapid ventricular response -Challenging to effectively treat atrial fibrillation given the marked hypotension in the setting of chronic liver disease.  Dr. Percival Spanish has been utilizing short acting diltiazem in the outpatient setting.  Dr. Rayann Heman with electrophysiology had seen in consultation yesterday.  Agreed with continued short acting diltiazem 15 mg p.o. every 12 hours.  Amiodarone had been stopped given his underlying liver cirrhosis. -Overall ventricular rates seem fairly reasonable, yes we will see heart rates at times in the 130s. -No additional or more aggressive management is currently going to be beneficial with poor prognosis. -He is not a candidate for chronic anticoagulation given bleeding risks, liver.  End-stage renal disease on hemodialysis -Per nephrology  Liver cirrhosis -Per GI  Chronic hypotension -Continue with midodrine  We will go ahead and sign off.  Please let us know if we can be of further assistance for you.      For questions or updates, please contact [place] Please consult [email or web address] for contact info under        Signed, Candee Furbish, MD  10/14/2020, 8:20 AM

## 2020-10-15 DIAGNOSIS — Z992 Dependence on renal dialysis: Secondary | ICD-10-CM | POA: Diagnosis not present

## 2020-10-15 DIAGNOSIS — N186 End stage renal disease: Secondary | ICD-10-CM | POA: Diagnosis not present

## 2020-10-15 DIAGNOSIS — D631 Anemia in chronic kidney disease: Secondary | ICD-10-CM | POA: Diagnosis not present

## 2020-10-15 LAB — CULTURE, BODY FLUID W GRAM STAIN -BOTTLE: Culture: NO GROWTH

## 2020-10-17 DIAGNOSIS — K7030 Alcoholic cirrhosis of liver without ascites: Secondary | ICD-10-CM | POA: Diagnosis not present

## 2020-10-17 DIAGNOSIS — N186 End stage renal disease: Secondary | ICD-10-CM | POA: Diagnosis not present

## 2020-10-17 DIAGNOSIS — D631 Anemia in chronic kidney disease: Secondary | ICD-10-CM | POA: Diagnosis not present

## 2020-10-17 DIAGNOSIS — I480 Paroxysmal atrial fibrillation: Secondary | ICD-10-CM | POA: Diagnosis not present

## 2020-10-17 DIAGNOSIS — Z992 Dependence on renal dialysis: Secondary | ICD-10-CM | POA: Diagnosis not present

## 2020-10-18 DIAGNOSIS — C220 Liver cell carcinoma: Secondary | ICD-10-CM | POA: Diagnosis not present

## 2020-10-19 DIAGNOSIS — Z992 Dependence on renal dialysis: Secondary | ICD-10-CM | POA: Diagnosis not present

## 2020-10-19 DIAGNOSIS — D631 Anemia in chronic kidney disease: Secondary | ICD-10-CM | POA: Diagnosis not present

## 2020-10-19 DIAGNOSIS — N186 End stage renal disease: Secondary | ICD-10-CM | POA: Diagnosis not present

## 2020-10-21 ENCOUNTER — Encounter (HOSPITAL_COMMUNITY): Payer: Self-pay

## 2020-10-21 ENCOUNTER — Telehealth: Payer: Self-pay | Admitting: Gastroenterology

## 2020-10-21 ENCOUNTER — Inpatient Hospital Stay (HOSPITAL_COMMUNITY)
Admission: EM | Admit: 2020-10-21 | Discharge: 2020-10-28 | DRG: 377 | Disposition: A | Discharge status: Home Health | Payer: Medicare Other | Attending: Internal Medicine | Admitting: Internal Medicine

## 2020-10-21 ENCOUNTER — Other Ambulatory Visit: Payer: Self-pay

## 2020-10-21 ENCOUNTER — Emergency Department (HOSPITAL_COMMUNITY): Payer: Medicare Other

## 2020-10-21 DIAGNOSIS — K2270 Barrett's esophagus without dysplasia: Secondary | ICD-10-CM | POA: Diagnosis present

## 2020-10-21 DIAGNOSIS — K7011 Alcoholic hepatitis with ascites: Secondary | ICD-10-CM | POA: Diagnosis not present

## 2020-10-21 DIAGNOSIS — I4891 Unspecified atrial fibrillation: Secondary | ICD-10-CM | POA: Diagnosis not present

## 2020-10-21 DIAGNOSIS — Z992 Dependence on renal dialysis: Secondary | ICD-10-CM | POA: Diagnosis not present

## 2020-10-21 DIAGNOSIS — K921 Melena: Secondary | ICD-10-CM | POA: Diagnosis not present

## 2020-10-21 DIAGNOSIS — I8510 Secondary esophageal varices without bleeding: Secondary | ICD-10-CM | POA: Diagnosis not present

## 2020-10-21 DIAGNOSIS — Z79899 Other long term (current) drug therapy: Secondary | ICD-10-CM

## 2020-10-21 DIAGNOSIS — R188 Other ascites: Secondary | ICD-10-CM

## 2020-10-21 DIAGNOSIS — W010XXA Fall on same level from slipping, tripping and stumbling without subsequent striking against object, initial encounter: Secondary | ICD-10-CM | POA: Diagnosis present

## 2020-10-21 DIAGNOSIS — Z66 Do not resuscitate: Secondary | ICD-10-CM | POA: Diagnosis not present

## 2020-10-21 DIAGNOSIS — I120 Hypertensive chronic kidney disease with stage 5 chronic kidney disease or end stage renal disease: Secondary | ICD-10-CM | POA: Diagnosis not present

## 2020-10-21 DIAGNOSIS — R55 Syncope and collapse: Secondary | ICD-10-CM | POA: Diagnosis not present

## 2020-10-21 DIAGNOSIS — K219 Gastro-esophageal reflux disease without esophagitis: Secondary | ICD-10-CM | POA: Diagnosis present

## 2020-10-21 DIAGNOSIS — K766 Portal hypertension: Secondary | ICD-10-CM | POA: Diagnosis not present

## 2020-10-21 DIAGNOSIS — I8511 Secondary esophageal varices with bleeding: Secondary | ICD-10-CM | POA: Diagnosis not present

## 2020-10-21 DIAGNOSIS — R140 Abdominal distension (gaseous): Secondary | ICD-10-CM | POA: Diagnosis not present

## 2020-10-21 DIAGNOSIS — K922 Gastrointestinal hemorrhage, unspecified: Secondary | ICD-10-CM | POA: Diagnosis not present

## 2020-10-21 DIAGNOSIS — D689 Coagulation defect, unspecified: Secondary | ICD-10-CM | POA: Diagnosis present

## 2020-10-21 DIAGNOSIS — K264 Chronic or unspecified duodenal ulcer with hemorrhage: Secondary | ICD-10-CM | POA: Diagnosis not present

## 2020-10-21 DIAGNOSIS — C220 Liver cell carcinoma: Secondary | ICD-10-CM

## 2020-10-21 DIAGNOSIS — Z87442 Personal history of urinary calculi: Secondary | ICD-10-CM | POA: Diagnosis not present

## 2020-10-21 DIAGNOSIS — K7460 Unspecified cirrhosis of liver: Secondary | ICD-10-CM

## 2020-10-21 DIAGNOSIS — Z20822 Contact with and (suspected) exposure to covid-19: Secondary | ICD-10-CM | POA: Diagnosis not present

## 2020-10-21 DIAGNOSIS — Z9221 Personal history of antineoplastic chemotherapy: Secondary | ICD-10-CM

## 2020-10-21 DIAGNOSIS — N186 End stage renal disease: Secondary | ICD-10-CM | POA: Diagnosis not present

## 2020-10-21 DIAGNOSIS — F1020 Alcohol dependence, uncomplicated: Secondary | ICD-10-CM | POA: Diagnosis present

## 2020-10-21 DIAGNOSIS — K7040 Alcoholic hepatic failure without coma: Secondary | ICD-10-CM | POA: Diagnosis present

## 2020-10-21 DIAGNOSIS — I499 Cardiac arrhythmia, unspecified: Secondary | ICD-10-CM | POA: Diagnosis not present

## 2020-10-21 DIAGNOSIS — K7030 Alcoholic cirrhosis of liver without ascites: Secondary | ICD-10-CM | POA: Diagnosis not present

## 2020-10-21 DIAGNOSIS — D62 Acute posthemorrhagic anemia: Secondary | ICD-10-CM | POA: Diagnosis not present

## 2020-10-21 DIAGNOSIS — M545 Low back pain, unspecified: Secondary | ICD-10-CM | POA: Diagnosis not present

## 2020-10-21 DIAGNOSIS — I9589 Other hypotension: Secondary | ICD-10-CM | POA: Diagnosis present

## 2020-10-21 DIAGNOSIS — E785 Hyperlipidemia, unspecified: Secondary | ICD-10-CM | POA: Diagnosis present

## 2020-10-21 DIAGNOSIS — R42 Dizziness and giddiness: Secondary | ICD-10-CM | POA: Diagnosis not present

## 2020-10-21 DIAGNOSIS — N250 Renal osteodystrophy: Secondary | ICD-10-CM | POA: Diagnosis not present

## 2020-10-21 DIAGNOSIS — Z8505 Personal history of malignant neoplasm of liver: Secondary | ICD-10-CM

## 2020-10-21 DIAGNOSIS — K7031 Alcoholic cirrhosis of liver with ascites: Secondary | ICD-10-CM | POA: Diagnosis present

## 2020-10-21 DIAGNOSIS — D631 Anemia in chronic kidney disease: Secondary | ICD-10-CM | POA: Diagnosis not present

## 2020-10-21 DIAGNOSIS — I480 Paroxysmal atrial fibrillation: Secondary | ICD-10-CM | POA: Diagnosis not present

## 2020-10-21 DIAGNOSIS — Z7189 Other specified counseling: Secondary | ICD-10-CM | POA: Diagnosis not present

## 2020-10-21 DIAGNOSIS — Z515 Encounter for palliative care: Secondary | ICD-10-CM | POA: Diagnosis not present

## 2020-10-21 DIAGNOSIS — I4819 Other persistent atrial fibrillation: Secondary | ICD-10-CM | POA: Diagnosis not present

## 2020-10-21 DIAGNOSIS — K3189 Other diseases of stomach and duodenum: Secondary | ICD-10-CM | POA: Diagnosis present

## 2020-10-21 DIAGNOSIS — Z888 Allergy status to other drugs, medicaments and biological substances status: Secondary | ICD-10-CM

## 2020-10-21 DIAGNOSIS — Z8249 Family history of ischemic heart disease and other diseases of the circulatory system: Secondary | ICD-10-CM

## 2020-10-21 DIAGNOSIS — Z87891 Personal history of nicotine dependence: Secondary | ICD-10-CM

## 2020-10-21 DIAGNOSIS — R58 Hemorrhage, not elsewhere classified: Secondary | ICD-10-CM | POA: Diagnosis not present

## 2020-10-21 DIAGNOSIS — K7290 Hepatic failure, unspecified without coma: Secondary | ICD-10-CM | POA: Diagnosis not present

## 2020-10-21 LAB — COMPREHENSIVE METABOLIC PANEL
ALT: 46 U/L — ABNORMAL HIGH (ref 0–44)
AST: 65 U/L — ABNORMAL HIGH (ref 15–41)
Albumin: 2.7 g/dL — ABNORMAL LOW (ref 3.5–5.0)
Alkaline Phosphatase: 94 U/L (ref 38–126)
Anion gap: 18 — ABNORMAL HIGH (ref 5–15)
BUN: 48 mg/dL — ABNORMAL HIGH (ref 8–23)
CO2: 21 mmol/L — ABNORMAL LOW (ref 22–32)
Calcium: 8.6 mg/dL — ABNORMAL LOW (ref 8.9–10.3)
Chloride: 91 mmol/L — ABNORMAL LOW (ref 98–111)
Creatinine, Ser: 5.14 mg/dL — ABNORMAL HIGH (ref 0.61–1.24)
GFR, Estimated: 11 mL/min — ABNORMAL LOW (ref >60)
Glucose, Bld: 205 mg/dL — ABNORMAL HIGH (ref 70–99)
Potassium: 4.5 mmol/L (ref 3.5–5.1)
Sodium: 130 mmol/L — ABNORMAL LOW (ref 135–145)
Total Bilirubin: 5.2 mg/dL — ABNORMAL HIGH (ref 0.3–1.2)
Total Protein: 5.5 g/dL — ABNORMAL LOW (ref 6.5–8.1)

## 2020-10-21 LAB — CBC WITH DIFFERENTIAL/PLATELET
Abs Immature Granulocytes: 0.09 10*3/uL — ABNORMAL HIGH (ref 0.00–0.07)
Basophils Absolute: 0 10*3/uL (ref 0.0–0.1)
Basophils Relative: 0 %
Eosinophils Absolute: 0.1 10*3/uL (ref 0.0–0.5)
Eosinophils Relative: 1 %
HCT: 24.1 % — ABNORMAL LOW (ref 39.0–52.0)
Hemoglobin: 7.7 g/dL — ABNORMAL LOW (ref 13.0–17.0)
Immature Granulocytes: 1 %
Lymphocytes Relative: 7 %
Lymphs Abs: 0.8 10*3/uL (ref 0.7–4.0)
MCH: 36 pg — ABNORMAL HIGH (ref 26.0–34.0)
MCHC: 32 g/dL (ref 30.0–36.0)
MCV: 112.6 fL — ABNORMAL HIGH (ref 80.0–100.0)
Monocytes Absolute: 0.8 10*3/uL (ref 0.1–1.0)
Monocytes Relative: 7 %
Neutro Abs: 9 10*3/uL — ABNORMAL HIGH (ref 1.7–7.7)
Neutrophils Relative %: 84 %
Platelets: 126 10*3/uL — ABNORMAL LOW (ref 150–400)
RBC: 2.14 MIL/uL — ABNORMAL LOW (ref 4.22–5.81)
RDW: 19.2 % — ABNORMAL HIGH (ref 11.5–15.5)
WBC: 10.8 10*3/uL — ABNORMAL HIGH (ref 4.0–10.5)
nRBC: 0.5 % — ABNORMAL HIGH (ref 0.0–0.2)

## 2020-10-21 LAB — RESP PANEL BY RT-PCR (FLU A&B, COVID) ARPGX2
Influenza A by PCR: NEGATIVE
Influenza B by PCR: NEGATIVE
SARS Coronavirus 2 by RT PCR: NEGATIVE

## 2020-10-21 LAB — I-STAT CHEM 8, ED
BUN: 45 mg/dL — ABNORMAL HIGH (ref 8–23)
Calcium, Ion: 1.04 mmol/L — ABNORMAL LOW (ref 1.15–1.40)
Chloride: 94 mmol/L — ABNORMAL LOW (ref 98–111)
Creatinine, Ser: 4.9 mg/dL — ABNORMAL HIGH (ref 0.61–1.24)
Glucose, Bld: 186 mg/dL — ABNORMAL HIGH (ref 70–99)
HCT: 24 % — ABNORMAL LOW (ref 39.0–52.0)
Hemoglobin: 8.2 g/dL — ABNORMAL LOW (ref 13.0–17.0)
Potassium: 4.6 mmol/L (ref 3.5–5.1)
Sodium: 132 mmol/L — ABNORMAL LOW (ref 135–145)
TCO2: 21 mmol/L — ABNORMAL LOW (ref 22–32)

## 2020-10-21 LAB — PREPARE RBC (CROSSMATCH)

## 2020-10-21 LAB — AMMONIA: Ammonia: 83 umol/L — ABNORMAL HIGH (ref 9–35)

## 2020-10-21 LAB — PROTIME-INR
INR: 2 — ABNORMAL HIGH (ref 0.8–1.2)
Prothrombin Time: 23.1 seconds — ABNORMAL HIGH (ref 11.4–15.2)

## 2020-10-21 LAB — ABO/RH: ABO/RH(D): A POS

## 2020-10-21 MED ORDER — SODIUM CHLORIDE 0.9 % IV SOLN
80.0000 mg | Freq: Once | INTRAVENOUS | Status: DC
  Filled 2020-10-21: qty 80

## 2020-10-21 MED ORDER — SODIUM CHLORIDE 0.9 % IV SOLN
1.0000 g | INTRAVENOUS | Status: AC
  Administered 2020-10-21 – 2020-10-25 (×5): 1 g via INTRAVENOUS
  Filled 2020-10-21 (×4): qty 10
  Filled 2020-10-21: qty 1
  Filled 2020-10-21: qty 10

## 2020-10-21 MED ORDER — PHYTONADIONE 5 MG PO TABS
10.0000 mg | ORAL_TABLET | Freq: Every day | ORAL | Status: AC
  Administered 2020-10-21 – 2020-10-23 (×3): 10 mg via ORAL
  Filled 2020-10-21 (×3): qty 2

## 2020-10-21 MED ORDER — PANTOPRAZOLE SODIUM 40 MG IV SOLR
40.0000 mg | Freq: Two times a day (BID) | INTRAVENOUS | Status: DC
  Administered 2020-10-25 – 2020-10-28 (×7): 40 mg via INTRAVENOUS
  Filled 2020-10-21 (×9): qty 40

## 2020-10-21 MED ORDER — HYDROCORTISONE 0.5 % EX CREA
TOPICAL_CREAM | CUTANEOUS | Status: DC | PRN
  Filled 2020-10-21: qty 28.35

## 2020-10-21 MED ORDER — PANTOPRAZOLE SODIUM 40 MG IV SOLR: 40.0000 mg | Freq: Two times a day (BID) | INTRAVENOUS | Status: DC

## 2020-10-21 MED ORDER — SODIUM CHLORIDE 0.9 % IV SOLN
50.0000 ug/h | INTRAVENOUS | Status: DC
  Administered 2020-10-21 – 2020-10-22 (×2): 50 ug/h via INTRAVENOUS
  Filled 2020-10-21 (×4): qty 1

## 2020-10-21 MED ORDER — RIFAXIMIN 550 MG PO TABS: 550.0000 mg | ORAL_TABLET | Freq: Two times a day (BID) | ORAL | 6 refills | Status: DC

## 2020-10-21 MED ORDER — SODIUM CHLORIDE 0.9% FLUSH
3.0000 mL | Freq: Two times a day (BID) | INTRAVENOUS | Status: DC
  Administered 2020-10-22 – 2020-10-28 (×11): 3 mL via INTRAVENOUS

## 2020-10-21 MED ORDER — RIFAXIMIN 550 MG PO TABS
550.0000 mg | ORAL_TABLET | Freq: Two times a day (BID) | ORAL | Status: DC
  Administered 2020-10-22 – 2020-10-28 (×14): 550 mg via ORAL
  Filled 2020-10-21 (×15): qty 1

## 2020-10-21 MED ORDER — SODIUM CHLORIDE 0.9 % IV SOLN
8.0000 mg/h | INTRAVENOUS | Status: AC
  Administered 2020-10-21 – 2020-10-24 (×8): 8 mg/h via INTRAVENOUS
  Filled 2020-10-21 (×11): qty 80

## 2020-10-21 MED ORDER — SODIUM CHLORIDE 0.9% IV SOLUTION: Freq: Once | INTRAVENOUS | Status: AC

## 2020-10-21 MED ORDER — SODIUM CHLORIDE 0.9 % IV SOLN
8.0000 mg/h | INTRAVENOUS | Status: DC
  Filled 2020-10-21: qty 80

## 2020-10-21 MED ORDER — OCTREOTIDE LOAD VIA INFUSION
50.0000 ug | Freq: Once | INTRAVENOUS | Status: AC
  Administered 2020-10-21: 50 ug via INTRAVENOUS
  Filled 2020-10-21: qty 25

## 2020-10-21 MED ORDER — HYDROCORTISONE 1 % EX CREA: TOPICAL_CREAM | Freq: Three times a day (TID) | CUTANEOUS | Status: DC | PRN

## 2020-10-21 MED ORDER — SODIUM CHLORIDE 0.9 % IV SOLN
10.0000 mL/h | Freq: Once | INTRAVENOUS | Status: AC
  Administered 2020-10-21: 10 mL/h via INTRAVENOUS

## 2020-10-21 MED ORDER — RIFAXIMIN 550 MG PO TABS: 550.0000 mg | ORAL_TABLET | Freq: Two times a day (BID) | ORAL | 0 refills | Status: DC

## 2020-10-21 MED ORDER — SODIUM CHLORIDE 0.9 % IV SOLN
80.0000 mg | Freq: Once | INTRAVENOUS | Status: AC
  Administered 2020-10-21: 80 mg via INTRAVENOUS
  Filled 2020-10-21: qty 80

## 2020-10-21 MED ORDER — METOPROLOL TARTRATE 5 MG/5ML IV SOLN
2.5000 mg | INTRAVENOUS | Status: DC | PRN
  Filled 2020-10-21: qty 5

## 2020-10-21 MED ORDER — LACTULOSE 10 GM/15ML PO SOLN: 20.0000 g | Freq: Three times a day (TID) | ORAL | 0 refills | Status: DC

## 2020-10-21 NOTE — H&P (Signed)
History and Physical    Dillon Perkins NLG:[id number] DOB: [birth year] DOA: 10/21/2020  PCP: Antony Contras, MD  Patient coming from: Home via EMS  I have personally briefly reviewed patient's old medical records in [place]  Chief Complaint: Bloody stool, dizziness  HPI: Dillon Perkins is a 70 y.o. 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  Patient states he was ambulating with the use of his walker earlier today when he became dizzy/lightheaded and fell to the ground.  He said he landed on his backside but did not lose consciousness or hit his head.  He said prior to this event he had a normal-appearing bowel movement however since then he has had 2 bowel movements with initially bright red blood and then burgundy colored stool.  He has not seen any other obvious bleeding including epistaxis, hemoptysis, hematemesis, hematuria.  He has not seen any dark black color stool.  He denies any nausea or vomiting.  He feels as if his abdomen is beginning to retain fluid again.  He denies any chest pain or dyspnea.  He has not seen any swelling in his lower extremities.  He reports adherence to dialysis with last treatment on 4/16.  He receives dialysis through a dialysis catheter in his right chest.  ED Course:  Initial vitals showed BP 82/65, pulse 128, RR 21, temp 97.4 F, SPO2 100% on room air.  Labs show sodium 130, potassium 4.5, bicarb 21, BUN 48, creatinine 5.14, serum glucose 205, total protein 5.5, albumin 2.7, AST 65, ALT 46, alk phos 94, total bilirubin 5.2, hemoglobin 7.7,  WBC 10.8, platelets 126,000, INR 2.0, ammonia 83.  SARS-CoV-2 PCR negative.  Influenza A/B PCR negative.  Portable chest x-ray without evidence of acute cardiopulmonary disease.  Right chest dialysis catheter noted in place.  Patient was ordered to receive 1 unit PRBC transfusion.  GI were consulted and have recommended starting continuous Protonix infusion, octreotide, IV ceftriaxone x5 days, vitamin K 10 mg daily for 3 days with plan for EGD tomorrow.  PCCM were consulted and feel patient is stable to admit to stepdown under hospitalist service.  Hospitalist service was consulted to admit for further evaluation and management.  Review of Systems: All systems reviewed and are negative except as documented in history of present illness above.   Past Medical History:  Diagnosis Date  . Alcoholic cirrhosis ([place])   . Atrial fibrillation ([place])   . End-stage renal disease on hemodialysis ([place]) 10/08/2020  . GERD (gastroesophageal reflux disease)   . Hepatocellular carcinoma ([place])   . History of chemotherapy taking immunotherapy   last dose 05-13-2020  . History of kidney stones   . Hyperlipidemia   . Hypertension     Past Surgical History:  Procedure Laterality Date  . APPENDECTOMY  yrs ago  . CYSTOSCOPY/URETEROSCOPY/HOLMIUM LASER/STENT PLACEMENT Bilateral 05/22/2020   Procedure: CYSTOSCOPY/RETROGRADE/URETEROSCOPY/HOLMIUM LASER/STENT PLACEMENT STONE BASKET RETRIVAL ;  Surgeon: Ceasar Mons, MD;  Location: [facility];  Service: Urology;  Laterality: Bilateral;  . IR ANGIOGRAM SELECTIVE EACH ADDITIONAL VESSEL  02/19/2020  . IR ANGIOGRAM SELECTIVE EACH ADDITIONAL VESSEL  02/19/2020  . IR ANGIOGRAM SELECTIVE EACH ADDITIONAL VESSEL  02/19/2020  .  IR ANGIOGRAM SELECTIVE EACH ADDITIONAL VESSEL  02/19/2020  . IR ANGIOGRAM SELECTIVE EACH ADDITIONAL VESSEL  02/19/2020  . IR ANGIOGRAM SELECTIVE EACH ADDITIONAL VESSEL  02/19/2020  . IR ANGIOGRAM SELECTIVE EACH ADDITIONAL  VESSEL  03/01/2020  . IR ANGIOGRAM SELECTIVE EACH ADDITIONAL VESSEL  03/01/2020  . IR ANGIOGRAM SELECTIVE EACH ADDITIONAL VESSEL  03/01/2020  . IR ANGIOGRAM VISCERAL SELECTIVE  02/19/2020  . IR ANGIOGRAM VISCERAL SELECTIVE  03/01/2020  . IR EMBO ARTERIAL NOT HEMORR HEMANG INC GUIDE ROADMAPPING  02/19/2020  . IR EMBO TUMOR ORGAN ISCHEMIA INFARCT INC GUIDE ROADMAPPING  02/19/2020  . IR EMBO TUMOR ORGAN ISCHEMIA INFARCT INC GUIDE ROADMAPPING  03/01/2020  . IR PARACENTESIS  07/15/2020  . IR PARACENTESIS  08/02/2020  . IR PARACENTESIS  08/19/2020  . IR PARACENTESIS  09/09/2020  . IR PARACENTESIS  10/07/2020  . IR PARACENTESIS  10/10/2020  . IR PARACENTESIS  10/14/2020  . IR RADIOLOGIST EVAL & MGMT  01/30/2020  . IR US GUIDE VASC ACCESS RIGHT  02/19/2020  . IR US GUIDE VASC ACCESS RIGHT  03/01/2020    Social History:  reports that he quit smoking about 48 years ago. His smoking use included cigarettes. He has a 0.50 pack-year smoking history. He has never used smokeless tobacco. He reports previous alcohol use of about 2.0 standard drinks of alcohol per week. He reports that he does not use drugs.  Allergies  Allergen Reactions  . Losartan Potassium Other (See Comments)  . Other Other (See Comments)  . Xarelto [Rivaroxaban] Other (See Comments)    gross hematuria    Family History  Problem Relation Age of Onset  . Heart disease Father 65       Heart attack  . Heart disease Sister 72       Heart attack  . Breast cancer Mother   . Colon cancer Neg Hx   . Stomach cancer Neg Hx   . Pancreatic cancer Neg Hx   . Rectal cancer Neg Hx   . Esophageal cancer Neg Hx      Prior to Admission medications   Medication Sig Start Date End Date Taking? Authorizing Provider  camphor-menthol [facility]) lotion Apply topically 2 (two) times daily. 10/14/20   Kayleen Memos, DO  diltiazem (CARDIZEM) 30 MG tablet Take 0.5 tablets (15 mg total) by mouth every 12 (twelve) hours. 10/14/20 01/12/21  Kayleen Memos, DO  feeding  supplement (ENSURE ENLIVE / ENSURE PLUS) LIQD Take 237 mLs by mouth 4 (four) times daily. 10/14/20   Kayleen Memos, DO  lactulose (CHRONULAC) 10 GM/15ML solution Take 30 mLs (20 g total) by mouth 3 (three) times daily. 10/21/20   Ladene Artist, MD  midodrine (PROAMATINE) 10 MG tablet Take 1 tablet (10 mg total) by mouth 3 (three) times daily. 10/14/20 01/12/21  Kayleen Memos, DO  multivitamin (RENA-VIT) TABS tablet Take 1 tablet by mouth at bedtime. 10/14/20 01/12/21  Kayleen Memos, DO  Nutritional Supplements (,FEEDING SUPPLEMENT, PROSOURCE PLUS) liquid Take 30 mLs by mouth 2 (two) times daily between meals for 7 days. 10/14/20 10/21/20  Kayleen Memos, DO  Nutritional Supplements (,FEEDING SUPPLEMENT, PROSOURCE PLUS) liquid Take 30 mLs by mouth 3 (three) times daily between meals for 7 days. 10/14/20 10/21/20  Kayleen Memos, DO  rifaximin (XIFAXAN) 550 MG TABS tablet Take 1 tablet (550 mg total) by mouth 2 (two) times daily for 12 doses. Samples of this drug were given to the patient, quantity 12, Lot Number [id number]  exp 04/26 10/21/20 10/27/20  Ladene Artist, MD    Physical Exam: Vitals:   10/21/20 1815 10/21/20 1830 10/21/20 1845 10/21/20 1900  BP: 96/76 110/67 98/73 100/65  Pulse: (!) 148 (!) 110 (!) 134 (!) 122  Resp: (!) '21 15 12 12  ' Temp:      TempSrc:      SpO2: 98% 98% 98% 95%   Constitutional: Resting supine in bed, NAD, calm, comfortable Eyes: PERRL, lids and conjunctivae normal ENMT: Mucous membranes are moist. Posterior pharynx clear of any exudate or lesions.Normal dentition.  Neck: normal, supple, no masses. Respiratory: clear to auscultation bilaterally, no wheezing, no crackles. Normal respiratory effort. No accessory muscle use.  Cardiovascular: Irregularly irregular and tachycardic, rate 100-120s on telemetry, no murmurs / rubs / gallops. No extremity edema. 2+ pedal pulses. Abdomen: Slightly nontense distended abdomen without tenderness, Bowel sounds positive.   Musculoskeletal: no clubbing / cyanosis. No joint deformity upper and lower extremities. Good ROM, no contractures. Normal muscle tone.  Skin: Slightly jaundiced appearance, no rashes, lesions, ulcers. No induration Neurologic: CN 2-12 grossly intact. Sensation intact. Strength 5/5 in all 4.  Psychiatric: Normal judgment and insight. Alert and oriented x 3. Normal mood.   Labs on Admission: I have personally reviewed following labs and imaging studies  CBC: Recent Labs  Lab 10/21/20 1503 10/21/20 1533  WBC 10.8*  --   NEUTROABS 9.0*  --   HGB 7.7* 8.2*  HCT 24.1* 24.0*  MCV 112.6*  --   PLT 126*  --    Basic Metabolic Panel: Recent Labs  Lab 10/21/20 1503 10/21/20 1533  NA 130* 132*  K 4.5 4.6  CL 91* 94*  CO2 21*  --   GLUCOSE 205* 186*  BUN 48* 45*  CREATININE 5.14* 4.90*  CALCIUM 8.6*  --    GFR: Estimated Creatinine Clearance: 13.4 mL/min (A) (by C-G formula based on SCr of 4.9 mg/dL (H)). Liver Function Tests: Recent Labs  Lab 10/21/20 1503  AST 65*  ALT 46*  ALKPHOS 94  BILITOT 5.2*  PROT 5.5*  ALBUMIN 2.7*   No results for input(s): LIPASE, AMYLASE in the last 168 hours. Recent Labs  Lab 10/21/20 1525  AMMONIA 83*   Coagulation Profile: Recent Labs  Lab 10/21/20 1503  INR 2.0*   Cardiac Enzymes: No results for input(s): CKTOTAL, CKMB, CKMBINDEX, TROPONINI in the last 168 hours. BNP (last 3 results) No results for input(s): PROBNP in the last 8760 hours. HbA1C: No results for input(s): HGBA1C in the last 72 hours. CBG: No results for input(s): GLUCAP in the last 168 hours. Lipid Profile: No results for input(s): CHOL, HDL, LDLCALC, TRIG, CHOLHDL, LDLDIRECT in the last 72 hours. Thyroid Function Tests: No results for input(s): TSH, T4TOTAL, FREET4, T3FREE, THYROIDAB in the last 72 hours. Anemia Panel: No results for input(s): VITAMINB12, FOLATE, FERRITIN, TIBC, IRON, RETICCTPCT in the last 72 hours. Urine analysis:    Component Value  Date/Time   COLORURINE AMBER (A) 10/09/2020 1153   APPEARANCEUR CLEAR 10/09/2020 1153   LABSPEC 1.016 10/09/2020 1153   PHURINE 5.0 10/09/2020 1153   GLUCOSEU NEGATIVE 10/09/2020 1153   HGBUR NEGATIVE 10/09/2020 1153   BILIRUBINUR NEGATIVE 10/09/2020 1153   KETONESUR NEGATIVE 10/09/2020 1153   PROTEINUR NEGATIVE 10/09/2020 1153   UROBILINOGEN 1.0 07/23/2014 0402   NITRITE NEGATIVE 10/09/2020 1153   LEUKOCYTESUR SMALL (A) 10/09/2020 1153    Radiological Exams on Admission: DG Chest 1 View  Result Date: 10/21/2020 CLINICAL DATA:  Fall, dizziness,  low back pain EXAM: CHEST  1 VIEW COMPARISON:  10/09/2020 FINDINGS: Lungs are clear.  No pleural effusion or pneumothorax. The heart is top-normal in size. Right chest dual lumen dialysis catheter. IMPRESSION: No evidence of acute cardiopulmonary disease. Electronically Signed   By: Julian Hy M.D.   On: 10/21/2020 15:54    EKG: Personally reviewed. A. fib, rate 131, PVC, low voltage, QTC 556.  Not significantly changed when compared to prior.  Assessment/Plan Principal Problem:   Acute GI bleeding Active Problems:   Atrial fibrillation with RVR (HCC)   Hepatocellular carcinoma (HCC)   ESRD on dialysis ([place])   Alcoholic cirrhosis of liver with ascites ([place])   Dillon Perkins is a 70 y.o. male with medical history significant for alcoholic cirrhosis with portal hypertension, severe portal hypertensive gastropathy, and esophageal varices, hepatocellular carcinoma s/p radioembolization and immunotherapy with nivolumab, persistent atrial fibrillation not on anticoagulation due to risk of bleeding, ESRD started on HD TTS 10/09/2019, chronic hypotension on midodrine who is admitted with acute GI bleed.  Acute GI bleed with symptomatic anemia Alcoholic cirrhosis Hepatocellular carcinoma s/p Y 90 and nivolumab Portal hypertension with esophageal varices: GI following, appreciate assistance.  Plan is for potential EGD late tomorrow  morning/midday.  He is already receiving transfusion of 1 unit PRBC at time of my evaluation.  MELD score is 35. -Clear liquid diet now, n.p.o. after midnight -Continue IV Protonix infusion -Continue IV octreotide infusion -Continue IV ceftriaxone 1 g daily x5 days -Continue rifaximin 550 mg twice daily -Vitamin K 10 mg daily x3 days per GI -Ammonia elevated however patient AAO x4 without evidence of hepatic encephalopathy, holding lactulose -Monitor H&H every 8 hours  Persistent atrial fibrillation with RVR: Chronically difficult to control A. fib due to sensitivity to medications.  Rate largely maintaining between 100-120 on admission, would not be too aggressive due to risk for hypotension.  Not on anticoagulation due to bleeding risk.  Can use IV metoprolol 2.5 mg if needed for persistent HR >120, hold for BP <90/60.  ESRD on TTS HD: Last dialyzed 4/16.  Currently no emergent need for HD.  Will need nephrology consult in a.m. for routine dialysis needs.  Goals of care: Discussed goals of care with patient at length.  He wants to pursue current management as detailed above with long-term goals to improve his day-to-day quality of life.  He is agreeable to palliative care consult while in hospital.  He is clear that [place] should be DNR in the event of significant decompensation or cardiac/respiratory arrest situation.  DVT prophylaxis: SCDs Code Status: DNR, confirmed with patient Family Communication: Discussed with patient's wife by phone Disposition Plan: From home, dispo pending clinical progress Consults called: GI, PCCM Level of care: Progressive Admission status:  Status is: Inpatient  Remains inpatient appropriate because:Ongoing diagnostic testing needed not appropriate for outpatient work up, IV treatments appropriate due to intensity of illness or inability to take PO and Inpatient level of care appropriate due to severity of illness   Dispo: The patient is from:  Home              Anticipated d/c is to: Home              Patient currently is not medically stable to d/c.   Zada Finders MD [facility]  If 7PM-7AM, please contact night-coverage [email or web address]  10/21/2020, 7:19 PM

## 2020-10-21 NOTE — ED Provider Notes (Signed)
[facility] EMERGENCY DEPARTMENT Provider Note   CSN: [id number] Arrival date & time: 10/21/20  1456     History No chief complaint on file.   Dillon Perkins is a 70 y.o. male.  The history is provided by the patient and medical records.   Dillon Perkins is a 70 y.o. male who presents to the Emergency Department complaining of G.I. bleed. He presents the emergency department for evaluation of G.I. bleed. He has a history of liver disease, CKD on dialysis as well as atrial fibrillation. Last night he began to feel dizzy. Today he was dizzy and fell due to it. He had three BMs today. First was normal, second were grossly bloody. EMS reports a fib with RVR heart rates in the 160s to 170s with blood pressure in the 80s. He is not on anticoagulation. No reports of fevers, vomiting, abdominal pain. Symptoms are severe and constant nature.    Past Medical History:  Diagnosis Date  . Alcoholic cirrhosis ([place])   . Atrial fibrillation ([place])   . End-stage renal disease on hemodialysis ([place]) 10/08/2020  . GERD (gastroesophageal reflux disease)   . Hepatocellular carcinoma ([place])   . History of chemotherapy taking immunotherapy   last dose 05-13-2020  . History of kidney stones   . Hyperlipidemia   . Hypertension     Patient Active Problem List   Diagnosis Date Noted  . Acute GI bleeding 10/21/2020  . Acute metabolic encephalopathy   . Alcoholic cirrhosis of liver with ascites ([place])   . Malnutrition of moderate degree 10/10/2020  . Sepsis ([place]) 10/09/2020  . Delirium 10/09/2020  . Altered mental status   . ESRD on dialysis ([place])   . Stage 3a chronic kidney disease ([place]) 09/05/2020  . Orthostatic hypotension 06/04/2020  . Kidney stones 05/22/2020  . Hepatocellular carcinoma ([place]) 03/26/2020  . Goals of care, counseling/discussion 03/26/2020  . Educated about COVID-19 virus infection 11/07/2018  . Dilated cardiomyopathy ([place]) 11/07/2018  . SOB (shortness of  breath) 11/07/2018  . Chest pain   . Acute renal insufficiency   . Paroxysmal atrial fibrillation ([facility])   . Bradycardia 07/22/2014  . Exercise intolerance 09/24/2012  . Abnormal CXR 09/06/2012  . Abnormal LFTs (liver function tests) 02/07/2012  . Hyperlipidemia 02/07/2012  . Atrial fibrillation with RVR ([place]) 01/31/2012  . Snoring 01/31/2012  . Essential hypertension 08/29/2010    Past Surgical History:  Procedure Laterality Date  . APPENDECTOMY  yrs ago  . CYSTOSCOPY/URETEROSCOPY/HOLMIUM LASER/STENT PLACEMENT Bilateral 05/22/2020   Procedure: CYSTOSCOPY/RETROGRADE/URETEROSCOPY/HOLMIUM LASER/STENT PLACEMENT STONE BASKET RETRIVAL ;  Surgeon: Ceasar Mons, MD;  Location: [facility];  Service: Urology;  Laterality: Bilateral;  . IR ANGIOGRAM SELECTIVE EACH ADDITIONAL VESSEL  02/19/2020  . IR ANGIOGRAM SELECTIVE EACH ADDITIONAL VESSEL  02/19/2020  . IR ANGIOGRAM SELECTIVE EACH ADDITIONAL VESSEL  02/19/2020  . IR ANGIOGRAM SELECTIVE EACH ADDITIONAL VESSEL  02/19/2020  . IR ANGIOGRAM SELECTIVE EACH ADDITIONAL VESSEL  02/19/2020  . IR ANGIOGRAM SELECTIVE EACH ADDITIONAL VESSEL  02/19/2020  . IR ANGIOGRAM SELECTIVE EACH ADDITIONAL VESSEL  03/01/2020  . IR ANGIOGRAM SELECTIVE EACH ADDITIONAL VESSEL  03/01/2020  . IR ANGIOGRAM SELECTIVE EACH ADDITIONAL VESSEL  03/01/2020  . IR ANGIOGRAM VISCERAL SELECTIVE  02/19/2020  . IR ANGIOGRAM VISCERAL SELECTIVE  03/01/2020  . IR EMBO ARTERIAL NOT HEMORR HEMANG INC GUIDE ROADMAPPING  02/19/2020  . IR EMBO TUMOR ORGAN ISCHEMIA INFARCT INC GUIDE ROADMAPPING  02/19/2020  . IR EMBO TUMOR ORGAN ISCHEMIA INFARCT INC  GUIDE ROADMAPPING  03/01/2020  . IR PARACENTESIS  07/15/2020  . IR PARACENTESIS  08/02/2020  . IR PARACENTESIS  08/19/2020  . IR PARACENTESIS  09/09/2020  . IR PARACENTESIS  10/07/2020  . IR PARACENTESIS  10/10/2020  . IR PARACENTESIS  10/14/2020  . IR RADIOLOGIST EVAL & MGMT  01/30/2020  . IR US GUIDE VASC ACCESS RIGHT  02/19/2020  .  IR US GUIDE VASC ACCESS RIGHT  03/01/2020       Family History  Problem Relation Age of Onset  . Heart disease Father 47       Heart attack  . Heart disease Sister 58       Heart attack  . Breast cancer Mother   . Colon cancer Neg Hx   . Stomach cancer Neg Hx   . Pancreatic cancer Neg Hx   . Rectal cancer Neg Hx   . Esophageal cancer Neg Hx     Social History   Tobacco Use  . Smoking status: Former Smoker    Packs/day: 1.00    Years: 0.50    Pack years: 0.50    Types: Cigarettes    Quit date: 08/21/1972    Years since quitting: 48.2  . Smokeless tobacco: Never Used  Vaping Use  . Vaping Use: Never used  Substance Use Topics  . Alcohol use: Not Currently    Alcohol/week: 2.0 standard drinks    Types: 2 Cans of beer per week  . Drug use: No    Home Medications Prior to Admission medications   Medication Sig Start Date End Date Taking? Authorizing Provider  acetaminophen (TYLENOL) 500 MG tablet Take 500-1,000 mg by mouth See admin instructions. Take 500-1,000 mg by mouth at bedtime as needed for mild pain from itching and an additional 500 mg once a day as needed for discomfort or headaches- max daily dosage of 1,500 mg from all sources   Yes [provider]  diltiazem (CARDIZEM) 30 MG tablet Take 0.5 tablets (15 mg total) by mouth every 12 (twelve) hours. Patient taking differently: Take 30 mg by mouth 3 (three) times daily. 10/14/20 01/12/21 Yes Hall, Lorenda Cahill, DO  feeding supplement (BOOST HIGH PROTEIN) LIQD Take 1 Container by mouth See admin instructions. Drink 1 container by mouth up to three times a day for supplementation   Yes [provider]  feeding supplement (ENSURE ENLIVE / ENSURE PLUS) LIQD Take 237 mLs by mouth 4 (four) times daily. 10/14/20  Yes Irene Pap N, DO  lactulose (CHRONULAC) 10 GM/15ML solution Take 30 mLs (20 g total) by mouth 3 (three) times daily. 10/21/20  Yes Ladene Artist, MD  midodrine (PROAMATINE) 10 MG tablet Take 1  tablet (10 mg total) by mouth 3 (three) times daily. 10/14/20 01/12/21 Yes Kayleen Memos, DO  multivitamin (RENA-VIT) TABS tablet Take 1 tablet by mouth at bedtime. 10/14/20 01/12/21 Yes Hall, Lorenda Cahill, DO  rifaximin (XIFAXAN) 550 MG TABS tablet Take 1 tablet (550 mg total) by mouth 2 (two) times daily for 12 doses. Samples of this drug were given to the patient, quantity 12, Lot Number [id number] exp 04/26 10/21/20 10/27/20 Yes Ladene Artist, MD  camphor-menthol [facility]) lotion Apply topically 2 (two) times daily. Patient not taking: No sig reported 10/14/20   Kayleen Memos, DO  Nutritional Supplements (,FEEDING SUPPLEMENT, PROSOURCE PLUS) liquid Take 30 mLs by mouth 2 (two) times daily between meals for 7 days. 10/14/20 10/21/20  Kayleen Memos, DO  Nutritional Supplements (,FEEDING SUPPLEMENT,  PROSOURCE PLUS) liquid Take 30 mLs by mouth 3 (three) times daily between meals for 7 days. 10/14/20 10/21/20  Kayleen Memos, DO    Allergies    Losartan, Losartan potassium, Other, and Rivaroxaban  Review of Systems   Review of Systems  All other systems reviewed and are negative.   Physical Exam Updated Vital Signs BP (!) 100/57 (BP Location: Left Arm)   Pulse (!) 125   Temp 97.9 F (36.6 C) (Oral)   Resp 18   Wt 83.6 kg   SpO2 97%   BMI 28.02 kg/m   Physical Exam Vitals and nursing note reviewed.  Constitutional:      General: He is in acute distress.     Appearance: He is well-developed. He is ill-appearing.  HENT:     Head: Normocephalic and atraumatic.  Cardiovascular:     Rate and Rhythm: Tachycardia present. Rhythm irregular.     Heart sounds: No murmur heard.   Pulmonary:     Effort: Pulmonary effort is normal. No respiratory distress.     Comments: Vas cath in right upper chest wall Abdominal:     Palpations: Abdomen is soft.     Tenderness: There is no abdominal tenderness. There is no guarding or rebound.  Genitourinary:    Comments: Gross blood on DRE Musculoskeletal:         General: No tenderness.  Skin:    General: Skin is warm and dry.  Neurological:     Mental Status: He is alert and oriented to person, place, and time.  Psychiatric:        Behavior: Behavior normal.     ED Results / Procedures / Treatments   Labs (all labs ordered are listed, but only abnormal results are displayed) Labs Reviewed  COMPREHENSIVE METABOLIC PANEL - Abnormal; Notable for the following components:      Result Value   Sodium 130 (*)    Chloride 91 (*)    CO2 21 (*)    Glucose, Bld 205 (*)    BUN 48 (*)    Creatinine, Ser 5.14 (*)    Calcium 8.6 (*)    Total Protein 5.5 (*)    Albumin 2.7 (*)    AST 65 (*)    ALT 46 (*)    Total Bilirubin 5.2 (*)    GFR, Estimated 11 (*)    Anion gap 18 (*)    All other components within normal limits  CBC WITH DIFFERENTIAL/PLATELET - Abnormal; Notable for the following components:   WBC 10.8 (*)    RBC 2.14 (*)    Hemoglobin 7.7 (*)    HCT 24.1 (*)    MCV 112.6 (*)    MCH 36.0 (*)    RDW 19.2 (*)    Platelets 126 (*)    nRBC 0.5 (*)    Neutro Abs 9.0 (*)    Abs Immature Granulocytes 0.09 (*)    All other components within normal limits  PROTIME-INR - Abnormal; Notable for the following components:   Prothrombin Time 23.1 (*)    INR 2.0 (*)    All other components within normal limits  AMMONIA - Abnormal; Notable for the following components:   Ammonia 83 (*)    All other components within normal limits  I-STAT CHEM 8, ED - Abnormal; Notable for the following components:   Sodium 132 (*)    Chloride 94 (*)    BUN 45 (*)    Creatinine, Ser 4.90 (*)  Glucose, Bld 186 (*)    Calcium, Ion 1.04 (*)    TCO2 21 (*)    Hemoglobin 8.2 (*)    HCT 24.0 (*)    All other components within normal limits  RESP PANEL BY RT-PCR (FLU A&B, COVID) [id number]  COMPREHENSIVE METABOLIC PANEL  CBC  PROTIME-INR  HEMOGLOBIN AND HEMATOCRIT, BLOOD  HEMOGLOBIN AND HEMATOCRIT, BLOOD  TYPE AND SCREEN  ABO/RH  PREPARE RBC (CROSSMATCH)     EKG EKG Interpretation  Date/Time:  Monday October 21 2020 15:10:20 EDT Ventricular Rate:  131 PR Interval:    QRS Duration: 57 QT Interval:  376 QTC Calculation: 556 R Axis:   9 Text Interpretation: Atrial fibrillation Ventricular premature complex Low voltage, extremity and precordial leads Prolonged QT interval Confirmed by Quintella Reichert [phone]) on 10/21/2020 4:21:43 PM   Radiology DG Chest 1 View  Result Date: 10/21/2020 CLINICAL DATA:  Fall, dizziness, low back pain EXAM: CHEST  1 VIEW COMPARISON:  10/09/2020 FINDINGS: Lungs are clear.  No pleural effusion or pneumothorax. The heart is top-normal in size. Right chest dual lumen dialysis catheter. IMPRESSION: No evidence of acute cardiopulmonary disease. Electronically Signed   By: Julian Hy M.D.   On: 10/21/2020 15:54    Procedures Procedures  CRITICAL CARE Performed by: Quintella Reichert   Total critical care time: 40 minutes  Critical care time was exclusive of separately billable procedures and treating other patients.  Critical care was necessary to treat or prevent imminent or life-threatening deterioration.  Critical care was time spent personally by me on the following activities: development of treatment plan with patient and/or surrogate as well as nursing, discussions with consultants, evaluation of patient's response to treatment, examination of patient, obtaining history from patient or surrogate, ordering and performing treatments and interventions, ordering and review of laboratory studies, ordering and review of radiographic studies, pulse oximetry and re-evaluation of patient's condition.  Medications Ordered in ED Medications  pantoprazole (PROTONIX) 80 mg in sodium chloride 0.9 % 100 mL (0.8 mg/mL) infusion (8 mg/hr Intravenous Restarted 10/21/20 2109)  pantoprazole (PROTONIX) injection 40 mg (has no administration in time range)  octreotide (SANDOSTATIN) 2 mcg/mL load via infusion 50 mcg (50 mcg  Intravenous Bolus from Bag 10/21/20 1748)    And  octreotide (SANDOSTATIN) 500 mcg in sodium chloride 0.9 % 250 mL (2 mcg/mL) infusion (50 mcg/hr Intravenous New Bag/Given 10/21/20 1747)  phytonadione (VITAMIN K) tablet 10 mg (10 mg Oral Given 10/21/20 1836)  cefTRIAXone (ROCEPHIN) 1 g in sodium chloride 0.9 % 100 mL IVPB (1 g Intravenous New Bag/Given 10/21/20 2114)  rifaximin (XIFAXAN) tablet 550 mg (has no administration in time range)  sodium chloride flush (NS) 0.9 % injection 3 mL (has no administration in time range)  metoprolol tartrate (LOPRESSOR) injection 2.5 mg (has no administration in time range)  hydrocortisone cream 1 % (has no administration in time range)  pantoprazole (PROTONIX) 80 mg in sodium chloride 0.9 % 100 mL IVPB (0 mg Intravenous Stopped 10/21/20 1911)  0.9 %  sodium chloride infusion (0 mL/hr Intravenous Stopped 10/21/20 1911)  0.9 %  sodium chloride infusion (Manually program via Guardrails IV Fluids) ( Intravenous Stopped 10/21/20 2108)    ED Course  I have reviewed the triage vital signs and the nursing notes.  Pertinent labs & imaging results that were available during my care of the patient were reviewed by me and considered in my medical decision making (see chart for details).    MDM Rules/Calculators/A&P  patient with history of cirrhosis, ESR D on dialysis, persistent a fib here for evaluation of dizziness and G.I. bleed. He is ill appearing on evaluation with tachycardia, generalized weakness. He has gross blood on rectal examination. Discussed with on-call provider with [facility].  Will start protonix, octreotide due to hx/o esophageal varices. Discussed with hospitalist, recommends [facility] CM consult. PC CM has evaluated the patient, recommend step down unit admission. Hospitalist consulted for admission.  Final Clinical Impression(s) / ED Diagnoses Final diagnoses:  GI bleed    Rx / DC Orders ED Discharge Orders    None        Quintella Reichert, MD 10/21/20 2345

## 2020-10-21 NOTE — Telephone Encounter (Signed)
Message sent to Azucena Freed, PA

## 2020-10-21 NOTE — H&P (View-Only) (Signed)
[facility] Consult: 4:01 PM 10/21/2020  LOS: 0 days    Referring Provider: Dr Ralene Bathe inED  Primary Care Physician:  Antony Contras, MD Primary Gastroenterologist:  Dr. Fuller Plan     Reason for Consultation: Passing burgundy blood per rectum.   HPI: Dillon Perkins is a 70 y.o. male.  PMH CKD stage V, had his first dialysis session on 10/08/2020.  A. fib, not on AC (recommended but patient declined).  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  Quit drinking alcohol over 1 year ago when he received diagnosis of cirrhosis. EGD in 03/2020 revealed mid and distal, grade 2, nonbleeding esophageal varices.  Mucosal changes consistent with long segments Barrett's, not biopsied due to presence of varices.  Small HH.  Severe portal hypertensive gastropathy.  Examined duodenum normal.  Esophageal varices.  Abdominal ascites, multiple paracentesis 1/10 through 10/07/2020: 5 paracenteses, 3 L removed each time.  Restaging MRI 07/04/2020 showed significantly improved tumor burden in the left liver and mixed response in the right liver.  Admitted 4/6 - 10/14/20 with new onset HE exhibited by several weeks of psychomotor slowing/delay and overt confusion starting a few days PTA.  Reported anorexia, diarrhea for which Dr. Benay Spice, oncologist, recommended Imodium resulting in several days without a bowel movement.  Progressive abdominal distention and abdominal girth. During the course of his admission underwent paracentesis on 4/7 (5.8 liters, no SBP) and 4/11 (4.6 liters), treated with albumin infusions 4/6 -4/11.  Vancomycin, cefepime initiated to treat staph capitis bacteremia.  Also noted was a macrocytic anemia (not requiring PRBCs) thrombocytopenia. 10/10/2020 ultrasound liver with Doppler: Hepatic cirrhosis, portal hypertension, ascites,  splenomegaly.  Patent PV with normal directional flow.  Previously treated left hepatic mass measuring 4.4 x 5 cm.  4.9 x 5.7 x 8.3 right hepatic mass, not seen on previous imaging, rule recommend MRI for further evaluation. 10/12/2020 MRI liver: Advance cirrhosis, severe iron deposition throughout the liver, multiple hepatic lesions.  Many of the previously's noted suspicious lesion regressed and disappeared following embolization.  Other lesions generally stable with exception of a lesion in segments 5 and 6 in the right lobe of the liver which is slightly larger compared with prior but demonstrates no internal enhancement.  Moderate to large volume ascites.  Multiple cystic lesions throughout the pancreas measuring 1.1 cm or less, stable compared with prior studies.  Small hiatal hernia and mild cardiomegaly also noted Encephalopathy improved with initiation of lactulose and rifaximin.  Had an episode of A. fib, RVR, cardiology initiated Cardizem 15 mg PO bid. hyponatremia resolved.  Requires chronic midodrine for hypotension.  Did not require transfusion for Hb 8.9.  Discharge meds included Rifaximin 550 bid, lactulose 20 g tid, no PPI or H2 blocker.     Unable to afford Rifaximin.  This AM GI started working on prior authorization and are able to supply 12 pills as a temporary measure.  It is possible that even with prior authorization insurance would not cover the medication and then staff would need to apply patient for patient assistance program for the medication. Early  this morning patient had a normal brown stool.  Yesterday he had 3 small brown stools.  Patient lost his balance despite using his walker and fell on his back.  Went to bed.  Woke up and had the first of 2 burgundy bloody stools.  His wife saw both of these.  He was not confused.  Called 911 and he was transported to [facility] ED.   Hgb 8.2 today, was 8.9 on 4/11.  MCV 109 one week ago. No platelets obtained yet.  INR 2, recent range was  1.9-2. Sodium 132.  BUN/creatinine 45/4.9, was 36/2.6 one week ago  Patient's been compliant with dialysis, last session was on Saturday.  He has been taking his lactulose and has not had any confusion.  Appetite remains depressed but there is no nausea, vomiting or abdominal pain.  Abdominal distention has not become an issue since discharge last week.   Past Medical History:  Diagnosis Date  . Alcoholic cirrhosis ([place])   . Atrial fibrillation ([place])   . End-stage renal disease on hemodialysis ([place]) 10/08/2020  . GERD (gastroesophageal reflux disease)   . Hepatocellular carcinoma ([place])   . History of chemotherapy taking immunotherapy   last dose 05-13-2020  . History of kidney stones   . Hyperlipidemia   . Hypertension     Past Surgical History:  Procedure Laterality Date  . APPENDECTOMY  yrs ago  . CYSTOSCOPY/URETEROSCOPY/HOLMIUM LASER/STENT PLACEMENT Bilateral 05/22/2020   Procedure: CYSTOSCOPY/RETROGRADE/URETEROSCOPY/HOLMIUM LASER/STENT PLACEMENT STONE BASKET RETRIVAL ;  Surgeon: Ceasar Mons, MD;  Location: [facility];  Service: Urology;  Laterality: Bilateral;  . IR ANGIOGRAM SELECTIVE EACH ADDITIONAL VESSEL  02/19/2020  . IR ANGIOGRAM SELECTIVE EACH ADDITIONAL VESSEL  02/19/2020  . IR ANGIOGRAM SELECTIVE EACH ADDITIONAL VESSEL  02/19/2020  . IR ANGIOGRAM SELECTIVE EACH ADDITIONAL VESSEL  02/19/2020  . IR ANGIOGRAM SELECTIVE EACH ADDITIONAL VESSEL  02/19/2020  . IR ANGIOGRAM SELECTIVE EACH ADDITIONAL VESSEL  02/19/2020  . IR ANGIOGRAM SELECTIVE EACH ADDITIONAL VESSEL  03/01/2020  . IR ANGIOGRAM SELECTIVE EACH ADDITIONAL VESSEL  03/01/2020  . IR ANGIOGRAM SELECTIVE EACH ADDITIONAL VESSEL  03/01/2020  . IR ANGIOGRAM VISCERAL SELECTIVE  02/19/2020  . IR ANGIOGRAM VISCERAL SELECTIVE  03/01/2020  . IR EMBO ARTERIAL NOT HEMORR HEMANG INC GUIDE ROADMAPPING  02/19/2020  . IR EMBO TUMOR ORGAN ISCHEMIA INFARCT INC GUIDE ROADMAPPING  02/19/2020  . IR EMBO TUMOR ORGAN  ISCHEMIA INFARCT INC GUIDE ROADMAPPING  03/01/2020  . IR PARACENTESIS  07/15/2020  . IR PARACENTESIS  08/02/2020  . IR PARACENTESIS  08/19/2020  . IR PARACENTESIS  09/09/2020  . IR PARACENTESIS  10/07/2020  . IR PARACENTESIS  10/10/2020  . IR PARACENTESIS  10/14/2020  . IR RADIOLOGIST EVAL & MGMT  01/30/2020  . IR US GUIDE VASC ACCESS RIGHT  02/19/2020  . IR US GUIDE VASC ACCESS RIGHT  03/01/2020    Prior to Admission medications   Medication Sig Start Date End Date Taking? Authorizing Provider  camphor-menthol [facility]) lotion Apply topically 2 (two) times daily. 10/14/20   Kayleen Memos, DO  diltiazem (CARDIZEM) 30 MG tablet Take 0.5 tablets (15 mg total) by mouth every 12 (twelve) hours. 10/14/20 01/12/21  Kayleen Memos, DO  feeding supplement (ENSURE ENLIVE / ENSURE PLUS) LIQD Take 237 mLs by mouth 4 (four) times daily. 10/14/20   Kayleen Memos, DO  lactulose (CHRONULAC) 10 GM/15ML solution Take 30 mLs (20 g total) by mouth 3 (three) times daily. 10/21/20   Ladene Artist,  MD  midodrine (PROAMATINE) 10 MG tablet Take 1 tablet (10 mg total) by mouth 3 (three) times daily. 10/14/20 01/12/21  Kayleen Memos, DO  multivitamin (RENA-VIT) TABS tablet Take 1 tablet by mouth at bedtime. 10/14/20 01/12/21  Kayleen Memos, DO  Nutritional Supplements (,FEEDING SUPPLEMENT, PROSOURCE PLUS) liquid Take 30 mLs by mouth 2 (two) times daily between meals for 7 days. 10/14/20 10/21/20  Kayleen Memos, DO  Nutritional Supplements (,FEEDING SUPPLEMENT, PROSOURCE PLUS) liquid Take 30 mLs by mouth 3 (three) times daily between meals for 7 days. 10/14/20 10/21/20  Kayleen Memos, DO  rifaximin (XIFAXAN) 550 MG TABS tablet Take 1 tablet (550 mg total) by mouth 2 (two) times daily for 12 doses. Samples of this drug were given to the patient, quantity 12, Lot Number [id number] exp 04/26 10/21/20 10/27/20  Ladene Artist, MD    Scheduled Meds:  Infusions:  PRN Meds:    Allergies as of 10/21/2020 - Review Complete 10/21/2020   Allergen Reaction Noted  . Losartan potassium Other (See Comments) 05/31/2020  . Other Other (See Comments) 05/31/2020  . Xarelto [rivaroxaban] Other (See Comments) 09/25/2020    Family History  Problem Relation Age of Onset  . Heart disease Father 62       Heart attack  . Heart disease Sister 74       Heart attack  . Breast cancer Mother   . Colon cancer Neg Hx   . Stomach cancer Neg Hx   . Pancreatic cancer Neg Hx   . Rectal cancer Neg Hx   . Esophageal cancer Neg Hx     Social History   Socioeconomic History  . Marital status: Married    Spouse name: [name]  . Number of children: 3  . Years of education: Not on file  . Highest education level: Not on file  Occupational History  . Occupation: retired  Tobacco Use  . Smoking status: Former Smoker    Packs/day: 1.00    Years: 0.50    Pack years: 0.50    Types: Cigarettes    Quit date: 08/21/1972    Years since quitting: 48.2  . Smokeless tobacco: Never Used  Vaping Use  . Vaping Use: Never used  Substance and Sexual Activity  . Alcohol use: Not Currently    Alcohol/week: 2.0 standard drinks    Types: 2 Cans of beer per week  . Drug use: No  . Sexual activity: Not on file  Other Topics Concern  . Not on file  Social History Narrative   Lives with wife.  Retired.  Three sons.    Social Determinants of Health   Financial Resource Strain: Not on file  Food Insecurity: Not on file  Transportation Needs: Not on file  Physical Activity: Not on file  Stress: Not on file  Social Connections: Not on file  Intimate Partner Violence: Not on file    REVIEW OF SYSTEMS: Constitutional: Weakness ENT:  No nose bleeds Pulm: No shortness of breath or cough CV:  No palpitations, no LE edema.  GU:  No hematuria, no frequency GI: See HPI. Heme: GI bleeding but no other unusual or excessive bleeding or bruising Transfusions: None Neuro:  No headaches, no peripheral tingling or numbness.  No syncope, no  seizures. Derm:  No itching, no rash or sores.  Endocrine:  No sweats or chills.  No polyuria or dysuria Immunization: Reviewed Travel:  None beyond local counties in last few months.    PHYSICAL  EXAM: Vital signs in last 24 hours: Vitals:   10/21/20 1511 10/21/20 1545  BP: 96/66 91/64  Pulse: (!) 120 (!) 123  Resp: 20 14  Temp: (!) 97.4 F (36.3 C)   SpO2: 96% 100%   Wt Readings from Last 3 Encounters:  10/14/20 81.9 kg  10/02/20 88.1 kg  09/24/20 84.7 kg    General: Patient is pale, looks chronically unwell but is alert and able to provide good history. Head: No facial asymmetry or swelling.  No signs of head trauma. Eyes: No scleral icterus.  No conjunctival pallor.  EOMI. Ears: Not hard of hearing Nose: No discharge or congestion Mouth: Tongue midline.  Mucosa moist, pink, clear.  Fair dentition. Neck: No JVD, no masses, no thyromegaly Lungs: Clear bilaterally without labored breathing or cough. Heart: Regular.  Rate in the 1 teens to 120s.  No MRG.  S1, S2 present. Abdomen: Soft, not distended or tender.  No obvious ascites.  No HSM, masses, bruits, hernias.  Active bowel sounds..   Rectal: DRE not performed but ED staff informed me that I DRE there was frank blood.  Observed the nurse removing bedpan from beneath the patient and there is a small to moderate amount, probably about 40 or 50 cc of burgundy liquid, malodorous stool in the bedpan. Musc/Skeltl: No joint redness, swelling or gross deformity. Extremities: No CCE. Neurologic: No asterixis.  Oriented x3.  Talkative.  Able to provide detailed history.  Moves all 4 limbs without gross weakness but strength not tested. Skin: Somewhat ashen coloration.  No sores or lesions. Nodes: No cervical adenopathy Psych: Calm, cooperative, fluid speech.  Intake/Output from previous day: No intake/output data recorded. Intake/Output this shift: No intake/output data recorded.  LAB RESULTS: Recent Labs    10/21/20 1533   HGB 8.2*  HCT 24.0*   BMET Lab Results  Component Value Date   NA 132 (L) 10/21/2020   NA 135 10/14/2020   NA 133 (L) 10/13/2020   K 4.6 10/21/2020   K 3.6 10/14/2020   K 3.6 10/13/2020   CL 94 (L) 10/21/2020   CL 98 10/14/2020   CL 96 (L) 10/13/2020   CO2 25 10/14/2020   CO2 30 10/13/2020   CO2 25 10/12/2020   GLUCOSE 186 (H) 10/21/2020   GLUCOSE 120 (H) 10/14/2020   GLUCOSE 108 (H) 10/13/2020   BUN 45 (H) 10/21/2020   BUN 36 (H) 10/14/2020   BUN 23 10/13/2020   CREATININE 4.90 (H) 10/21/2020   CREATININE 2.67 (H) 10/14/2020   CREATININE 2.27 (H) 10/13/2020   CALCIUM 9.3 10/14/2020   CALCIUM 9.3 10/13/2020   CALCIUM 9.0 10/12/2020   LFT No results for input(s): PROT, ALBUMIN, AST, ALT, ALKPHOS, BILITOT, BILIDIR, IBILI in the last 72 hours. PT/INR Lab Results  Component Value Date   INR 1.9 (H) 10/14/2020   INR 1.9 (H) 10/13/2020   INR 1.9 (H) 10/12/2020   Hepatitis Panel No results for input(s): HEPBSAG, HCVAB, HEPAIGM, HEPBIGM in the last 72 hours. C-Diff No components found for: CDIFF Lipase     Component Value Date/Time   LIPASE 68 (H) 10/09/2020 1055    Drugs of Abuse     Component Value Date/Time   LABOPIA NONE DETECTED 10/09/2020 1153   COCAINSCRNUR NONE DETECTED 10/09/2020 1153   LABBENZ POSITIVE (A) 10/09/2020 1153   AMPHETMU NONE DETECTED 10/09/2020 1153   THCU NONE DETECTED 10/09/2020 1153   LABBARB NONE DETECTED 10/09/2020 1153     RADIOLOGY STUDIES: DG Chest 1  View  Result Date: 10/21/2020 CLINICAL DATA:  Fall, dizziness, low back pain EXAM: CHEST  1 VIEW COMPARISON:  10/09/2020 FINDINGS: Lungs are clear.  No pleural effusion or pneumothorax. The heart is top-normal in size. Right chest dual lumen dialysis catheter. IMPRESSION: No evidence of acute cardiopulmonary disease. Electronically Signed   By: Julian Hy M.D.   On: 10/21/2020 15:54     IMPRESSION:   *   Burgundy stool. EGD in 03/2020 revealed mid and distal, grade 2,  nonbleeding esophageal varices.  Mucosal changes consistent with long segments Barrett's, not biopsied.  Small HH.  Severe portal hypertensive gastropathy.  Examined duodenum normal.  *  ESRD.  Started TTS on 4/6.    *   Cirrhosis attributed to ETOH.  Abstinent since 01/2020.  *   HCC. Multifocal HCC treated with Y 92 02/2020, nivolumab. 10/12/2020 MRI liver: Advance cirrhosis, severe iron deposition throughout the liver, multiple hepatic lesions.  Many of the previously's noted suspicious lesion regressed and disappeared following embolization.  Other lesions generally stable with exception of a lesion in segments 5 and 6 in the right lobe of the liver which is slightly larger compared with prior but demonstrates no internal enhancement AFP 8.7 on 3/1.    *   HE.   Improved since compliance with lactulose.  Working on Systems developer for [employer].    PLAN:     *   Supportive care.  Octreotide and PPI drip. Rocephin x 5 d.  *    EGD arranged for late tomorrow morning/midday.  Patient may require colonoscopy if no findings on EGD explain his bleeding.  *   Rifaximin 550 mg po bid.   hold lactulose for now given the bleeding. Vit K 10 mg po daily x 3.     *    okay for clear liquids but n.p.o. after midnight.  *   Serial CBC, INR.  Check CMET  *   Do not transfuse unless Hgb goes below 7, and do not over transfuse, 1 unit at a time is best in pt w portal htn and varices.  I canceled the 1 unit PRBC that has been ordered.   Azucena Freed  10/21/2020, 4:01 PM Phone [phone]   Attending physician's note   I have taken a history, examined the patient and reviewed the chart. I agree with the Advanced Practitioner's note, impression and recommendations.   80 yr M with ETOH cirrhosis decompensated with ascites, hepatic encephalopathy and hepatocellular carcinoma s/p Y90 and Nivolumab with 2 lesions enlarged in R liver compared to prior concerning for recurrent disease ESRD initiated  on HD 10/08/20  MELD score 34  He was brought to ER after a fall and also had episodes of hematochezia  Monitor Hgb Q8h and transfuse only if below 7, avoid over transfusion Octreotide and PPI gtt Ceftriaxone for SBP prophylaxis  Xifaximin 550mg  BID  Will plan for EGD tomorrow AM to exclude variceal hemorrhage NPO after midnight Clear liquids tonight  HCC: MRI concerning for recurrent disease. Not a candidate for liver transplant, local or systemic therapy with worsening decompensation.   Overall poor prognosis, please consult palliative care to discuss goals of care    K. Denzil Magnuson , MD [phone]

## 2020-10-21 NOTE — Telephone Encounter (Signed)
Patient was prescribed xifaxan 550 mg BID while inpatient. He has not been able to pick up and start on this because it was prescribed by the hospitalist and the prior auth has not been performed.  New rx sent.  Wife will come pick up 12 pills until OV on Wed with Amy Esterwood PA and prior auth performed.  She is advised that may have to use the assistance program if not going to be approved by insurance.  She understands that he needs to be taking lactulose as well.  New rx sent for this.

## 2020-10-21 NOTE — Consult Note (Addendum)
[facility] Consult: 4:01 PM 10/21/2020  LOS: 0 days    Referring Provider: Dr Ralene Bathe inED  Primary Care Physician:  Antony Contras, MD Primary Gastroenterologist:  Dr. Fuller Plan     Reason for Consultation: Passing burgundy blood per rectum.   HPI: Dillon Perkins is a 70 y.o. male.  PMH CKD stage V, had his first dialysis session on 10/08/2020.  A. fib, not on AC (recommended but patient declined).  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  Quit drinking alcohol over 1 year ago when he received diagnosis of cirrhosis. EGD in 03/2020 revealed mid and distal, grade 2, nonbleeding esophageal varices.  Mucosal changes consistent with long segments Barrett's, not biopsied due to presence of varices.  Small HH.  Severe portal hypertensive gastropathy.  Examined duodenum normal.  Esophageal varices.  Abdominal ascites, multiple paracentesis 1/10 through 10/07/2020: 5 paracenteses, 3 L removed each time.  Restaging MRI 07/04/2020 showed significantly improved tumor burden in the left liver and mixed response in the right liver.  Admitted 4/6 - 10/14/20 with new onset HE exhibited by several weeks of psychomotor slowing/delay and overt confusion starting a few days PTA.  Reported anorexia, diarrhea for which Dr. Benay Spice, oncologist, recommended Imodium resulting in several days without a bowel movement.  Progressive abdominal distention and abdominal girth. During the course of his admission underwent paracentesis on 4/7 (5.8 liters, no SBP) and 4/11 (4.6 liters), treated with albumin infusions 4/6 -4/11.  Vancomycin, cefepime initiated to treat staph capitis bacteremia.  Also noted was a macrocytic anemia (not requiring PRBCs) thrombocytopenia. 10/10/2020 ultrasound liver with Doppler: Hepatic cirrhosis, portal hypertension, ascites,  splenomegaly.  Patent PV with normal directional flow.  Previously treated left hepatic mass measuring 4.4 x 5 cm.  4.9 x 5.7 x 8.3 right hepatic mass, not seen on previous imaging, rule recommend MRI for further evaluation. 10/12/2020 MRI liver: Advance cirrhosis, severe iron deposition throughout the liver, multiple hepatic lesions.  Many of the previously's noted suspicious lesion regressed and disappeared following embolization.  Other lesions generally stable with exception of a lesion in segments 5 and 6 in the right lobe of the liver which is slightly larger compared with prior but demonstrates no internal enhancement.  Moderate to large volume ascites.  Multiple cystic lesions throughout the pancreas measuring 1.1 cm or less, stable compared with prior studies.  Small hiatal hernia and mild cardiomegaly also noted Encephalopathy improved with initiation of lactulose and rifaximin.  Had an episode of A. fib, RVR, cardiology initiated Cardizem 15 mg PO bid. hyponatremia resolved.  Requires chronic midodrine for hypotension.  Did not require transfusion for Hb 8.9.  Discharge meds included Rifaximin 550 bid, lactulose 20 g tid, no PPI or H2 blocker.     Unable to afford Rifaximin.  This AM GI started working on prior authorization and are able to supply 12 pills as a temporary measure.  It is possible that even with prior authorization insurance would not cover the medication and then staff would need to apply patient for patient assistance program for the medication. Early  this morning patient had a normal brown stool.  Yesterday he had 3 small brown stools.  Patient lost his balance despite using his walker and fell on his back.  Went to bed.  Woke up and had the first of 2 burgundy bloody stools.  His wife saw both of these.  He was not confused.  Called 911 and he was transported to [facility] ED.   Hgb 8.2 today, was 8.9 on 4/11.  MCV 109 one week ago. No platelets obtained yet.  INR 2, recent range was  1.9-2. Sodium 132.  BUN/creatinine 45/4.9, was 36/2.6 one week ago  Patient's been compliant with dialysis, last session was on Saturday.  He has been taking his lactulose and has not had any confusion.  Appetite remains depressed but there is no nausea, vomiting or abdominal pain.  Abdominal distention has not become an issue since discharge last week.   Past Medical History:  Diagnosis Date  . Alcoholic cirrhosis ([place])   . Atrial fibrillation ([place])   . End-stage renal disease on hemodialysis ([place]) 10/08/2020  . GERD (gastroesophageal reflux disease)   . Hepatocellular carcinoma ([place])   . History of chemotherapy taking immunotherapy   last dose 05-13-2020  . History of kidney stones   . Hyperlipidemia   . Hypertension     Past Surgical History:  Procedure Laterality Date  . APPENDECTOMY  yrs ago  . CYSTOSCOPY/URETEROSCOPY/HOLMIUM LASER/STENT PLACEMENT Bilateral 05/22/2020   Procedure: CYSTOSCOPY/RETROGRADE/URETEROSCOPY/HOLMIUM LASER/STENT PLACEMENT STONE BASKET RETRIVAL ;  Surgeon: Ceasar Mons, MD;  Location: [facility];  Service: Urology;  Laterality: Bilateral;  . IR ANGIOGRAM SELECTIVE EACH ADDITIONAL VESSEL  02/19/2020  . IR ANGIOGRAM SELECTIVE EACH ADDITIONAL VESSEL  02/19/2020  . IR ANGIOGRAM SELECTIVE EACH ADDITIONAL VESSEL  02/19/2020  . IR ANGIOGRAM SELECTIVE EACH ADDITIONAL VESSEL  02/19/2020  . IR ANGIOGRAM SELECTIVE EACH ADDITIONAL VESSEL  02/19/2020  . IR ANGIOGRAM SELECTIVE EACH ADDITIONAL VESSEL  02/19/2020  . IR ANGIOGRAM SELECTIVE EACH ADDITIONAL VESSEL  03/01/2020  . IR ANGIOGRAM SELECTIVE EACH ADDITIONAL VESSEL  03/01/2020  . IR ANGIOGRAM SELECTIVE EACH ADDITIONAL VESSEL  03/01/2020  . IR ANGIOGRAM VISCERAL SELECTIVE  02/19/2020  . IR ANGIOGRAM VISCERAL SELECTIVE  03/01/2020  . IR EMBO ARTERIAL NOT HEMORR HEMANG INC GUIDE ROADMAPPING  02/19/2020  . IR EMBO TUMOR ORGAN ISCHEMIA INFARCT INC GUIDE ROADMAPPING  02/19/2020  . IR EMBO TUMOR ORGAN  ISCHEMIA INFARCT INC GUIDE ROADMAPPING  03/01/2020  . IR PARACENTESIS  07/15/2020  . IR PARACENTESIS  08/02/2020  . IR PARACENTESIS  08/19/2020  . IR PARACENTESIS  09/09/2020  . IR PARACENTESIS  10/07/2020  . IR PARACENTESIS  10/10/2020  . IR PARACENTESIS  10/14/2020  . IR RADIOLOGIST EVAL & MGMT  01/30/2020  . IR US GUIDE VASC ACCESS RIGHT  02/19/2020  . IR US GUIDE VASC ACCESS RIGHT  03/01/2020    Prior to Admission medications   Medication Sig Start Date End Date Taking? Authorizing Provider  camphor-menthol [facility]) lotion Apply topically 2 (two) times daily. 10/14/20   Kayleen Memos, DO  diltiazem (CARDIZEM) 30 MG tablet Take 0.5 tablets (15 mg total) by mouth every 12 (twelve) hours. 10/14/20 01/12/21  Kayleen Memos, DO  feeding supplement (ENSURE ENLIVE / ENSURE PLUS) LIQD Take 237 mLs by mouth 4 (four) times daily. 10/14/20   Kayleen Memos, DO  lactulose (CHRONULAC) 10 GM/15ML solution Take 30 mLs (20 g total) by mouth 3 (three) times daily. 10/21/20   Ladene Artist,  MD  midodrine (PROAMATINE) 10 MG tablet Take 1 tablet (10 mg total) by mouth 3 (three) times daily. 10/14/20 01/12/21  Kayleen Memos, DO  multivitamin (RENA-VIT) TABS tablet Take 1 tablet by mouth at bedtime. 10/14/20 01/12/21  Kayleen Memos, DO  Nutritional Supplements (,FEEDING SUPPLEMENT, PROSOURCE PLUS) liquid Take 30 mLs by mouth 2 (two) times daily between meals for 7 days. 10/14/20 10/21/20  Kayleen Memos, DO  Nutritional Supplements (,FEEDING SUPPLEMENT, PROSOURCE PLUS) liquid Take 30 mLs by mouth 3 (three) times daily between meals for 7 days. 10/14/20 10/21/20  Kayleen Memos, DO  rifaximin (XIFAXAN) 550 MG TABS tablet Take 1 tablet (550 mg total) by mouth 2 (two) times daily for 12 doses. Samples of this drug were given to the patient, quantity 12, Lot Number [id number] exp 04/26 10/21/20 10/27/20  Ladene Artist, MD    Scheduled Meds:  Infusions:  PRN Meds:    Allergies as of 10/21/2020 - Review Complete 10/21/2020   Allergen Reaction Noted  . Losartan potassium Other (See Comments) 05/31/2020  . Other Other (See Comments) 05/31/2020  . Xarelto [rivaroxaban] Other (See Comments) 09/25/2020    Family History  Problem Relation Age of Onset  . Heart disease Father 13       Heart attack  . Heart disease Sister 8       Heart attack  . Breast cancer Mother   . Colon cancer Neg Hx   . Stomach cancer Neg Hx   . Pancreatic cancer Neg Hx   . Rectal cancer Neg Hx   . Esophageal cancer Neg Hx     Social History   Socioeconomic History  . Marital status: Married    Spouse name: [name]  . Number of children: 3  . Years of education: Not on file  . Highest education level: Not on file  Occupational History  . Occupation: retired  Tobacco Use  . Smoking status: Former Smoker    Packs/day: 1.00    Years: 0.50    Pack years: 0.50    Types: Cigarettes    Quit date: 08/21/1972    Years since quitting: 48.2  . Smokeless tobacco: Never Used  Vaping Use  . Vaping Use: Never used  Substance and Sexual Activity  . Alcohol use: Not Currently    Alcohol/week: 2.0 standard drinks    Types: 2 Cans of beer per week  . Drug use: No  . Sexual activity: Not on file  Other Topics Concern  . Not on file  Social History Narrative   Lives with wife.  Retired.  Three sons.    Social Determinants of Health   Financial Resource Strain: Not on file  Food Insecurity: Not on file  Transportation Needs: Not on file  Physical Activity: Not on file  Stress: Not on file  Social Connections: Not on file  Intimate Partner Violence: Not on file    REVIEW OF SYSTEMS: Constitutional: Weakness ENT:  No nose bleeds Pulm: No shortness of breath or cough CV:  No palpitations, no LE edema.  GU:  No hematuria, no frequency GI: See HPI. Heme: GI bleeding but no other unusual or excessive bleeding or bruising Transfusions: None Neuro:  No headaches, no peripheral tingling or numbness.  No syncope, no  seizures. Derm:  No itching, no rash or sores.  Endocrine:  No sweats or chills.  No polyuria or dysuria Immunization: Reviewed Travel:  None beyond local counties in last few months.    PHYSICAL  EXAM: Vital signs in last 24 hours: Vitals:   10/21/20 1511 10/21/20 1545  BP: 96/66 91/64  Pulse: (!) 120 (!) 123  Resp: 20 14  Temp: (!) 97.4 F (36.3 C)   SpO2: 96% 100%   Wt Readings from Last 3 Encounters:  10/14/20 81.9 kg  10/02/20 88.1 kg  09/24/20 84.7 kg    General: Patient is pale, looks chronically unwell but is alert and able to provide good history. Head: No facial asymmetry or swelling.  No signs of head trauma. Eyes: No scleral icterus.  No conjunctival pallor.  EOMI. Ears: Not hard of hearing Nose: No discharge or congestion Mouth: Tongue midline.  Mucosa moist, pink, clear.  Fair dentition. Neck: No JVD, no masses, no thyromegaly Lungs: Clear bilaterally without labored breathing or cough. Heart: Regular.  Rate in the 1 teens to 120s.  No MRG.  S1, S2 present. Abdomen: Soft, not distended or tender.  No obvious ascites.  No HSM, masses, bruits, hernias.  Active bowel sounds..   Rectal: DRE not performed but ED staff informed me that I DRE there was frank blood.  Observed the nurse removing bedpan from beneath the patient and there is a small to moderate amount, probably about 40 or 50 cc of burgundy liquid, malodorous stool in the bedpan. Musc/Skeltl: No joint redness, swelling or gross deformity. Extremities: No CCE. Neurologic: No asterixis.  Oriented x3.  Talkative.  Able to provide detailed history.  Moves all 4 limbs without gross weakness but strength not tested. Skin: Somewhat ashen coloration.  No sores or lesions. Nodes: No cervical adenopathy Psych: Calm, cooperative, fluid speech.  Intake/Output from previous day: No intake/output data recorded. Intake/Output this shift: No intake/output data recorded.  LAB RESULTS: Recent Labs    10/21/20 1533   HGB 8.2*  HCT 24.0*   BMET Lab Results  Component Value Date   NA 132 (L) 10/21/2020   NA 135 10/14/2020   NA 133 (L) 10/13/2020   K 4.6 10/21/2020   K 3.6 10/14/2020   K 3.6 10/13/2020   CL 94 (L) 10/21/2020   CL 98 10/14/2020   CL 96 (L) 10/13/2020   CO2 25 10/14/2020   CO2 30 10/13/2020   CO2 25 10/12/2020   GLUCOSE 186 (H) 10/21/2020   GLUCOSE 120 (H) 10/14/2020   GLUCOSE 108 (H) 10/13/2020   BUN 45 (H) 10/21/2020   BUN 36 (H) 10/14/2020   BUN 23 10/13/2020   CREATININE 4.90 (H) 10/21/2020   CREATININE 2.67 (H) 10/14/2020   CREATININE 2.27 (H) 10/13/2020   CALCIUM 9.3 10/14/2020   CALCIUM 9.3 10/13/2020   CALCIUM 9.0 10/12/2020   LFT No results for input(s): PROT, ALBUMIN, AST, ALT, ALKPHOS, BILITOT, BILIDIR, IBILI in the last 72 hours. PT/INR Lab Results  Component Value Date   INR 1.9 (H) 10/14/2020   INR 1.9 (H) 10/13/2020   INR 1.9 (H) 10/12/2020   Hepatitis Panel No results for input(s): HEPBSAG, HCVAB, HEPAIGM, HEPBIGM in the last 72 hours. C-Diff No components found for: CDIFF Lipase     Component Value Date/Time   LIPASE 68 (H) 10/09/2020 1055    Drugs of Abuse     Component Value Date/Time   LABOPIA NONE DETECTED 10/09/2020 1153   COCAINSCRNUR NONE DETECTED 10/09/2020 1153   LABBENZ POSITIVE (A) 10/09/2020 1153   AMPHETMU NONE DETECTED 10/09/2020 1153   THCU NONE DETECTED 10/09/2020 1153   LABBARB NONE DETECTED 10/09/2020 1153     RADIOLOGY STUDIES: DG Chest 1  View  Result Date: 10/21/2020 CLINICAL DATA:  Fall, dizziness, low back pain EXAM: CHEST  1 VIEW COMPARISON:  10/09/2020 FINDINGS: Lungs are clear.  No pleural effusion or pneumothorax. The heart is top-normal in size. Right chest dual lumen dialysis catheter. IMPRESSION: No evidence of acute cardiopulmonary disease. Electronically Signed   By: Julian Hy M.D.   On: 10/21/2020 15:54     IMPRESSION:   *   Burgundy stool. EGD in 03/2020 revealed mid and distal, grade 2,  nonbleeding esophageal varices.  Mucosal changes consistent with long segments Barrett's, not biopsied.  Small HH.  Severe portal hypertensive gastropathy.  Examined duodenum normal.  *  ESRD.  Started TTS on 4/6.    *   Cirrhosis attributed to ETOH.  Abstinent since 01/2020.  *   HCC. Multifocal HCC treated with Y 92 02/2020, nivolumab. 10/12/2020 MRI liver: Advance cirrhosis, severe iron deposition throughout the liver, multiple hepatic lesions.  Many of the previously's noted suspicious lesion regressed and disappeared following embolization.  Other lesions generally stable with exception of a lesion in segments 5 and 6 in the right lobe of the liver which is slightly larger compared with prior but demonstrates no internal enhancement AFP 8.7 on 3/1.    *   HE.   Improved since compliance with lactulose.  Working on Systems developer for [employer].    PLAN:     *   Supportive care.  Octreotide and PPI drip. Rocephin x 5 d.  *    EGD arranged for late tomorrow morning/midday.  Patient may require colonoscopy if no findings on EGD explain his bleeding.  *   Rifaximin 550 mg po bid.   hold lactulose for now given the bleeding. Vit K 10 mg po daily x 3.     *    okay for clear liquids but n.p.o. after midnight.  *   Serial CBC, INR.  Check CMET  *   Do not transfuse unless Hgb goes below 7, and do not over transfuse, 1 unit at a time is best in pt w portal htn and varices.  I canceled the 1 unit PRBC that has been ordered.   Azucena Freed  10/21/2020, 4:01 PM Phone [phone]   Attending physician's note   I have taken a history, examined the patient and reviewed the chart. I agree with the Advanced Practitioner's note, impression and recommendations.   33 yr M with ETOH cirrhosis decompensated with ascites, hepatic encephalopathy and hepatocellular carcinoma s/p Y90 and Nivolumab with 2 lesions enlarged in R liver compared to prior concerning for recurrent disease ESRD initiated  on HD 10/08/20  MELD score 34  He was brought to ER after a fall and also had episodes of hematochezia  Monitor Hgb Q8h and transfuse only if below 7, avoid over transfusion Octreotide and PPI gtt Ceftriaxone for SBP prophylaxis  Xifaximin 550mg  BID  Will plan for EGD tomorrow AM to exclude variceal hemorrhage NPO after midnight Clear liquids tonight  HCC: MRI concerning for recurrent disease. Not a candidate for liver transplant, local or systemic therapy with worsening decompensation.   Overall poor prognosis, please consult palliative care to discuss goals of care    K. Denzil Magnuson , MD [phone]

## 2020-10-21 NOTE — ED Notes (Signed)
Electronic Consent f or blood products was obtained & witnessed by this RN.

## 2020-10-21 NOTE — Telephone Encounter (Signed)
Please let the APP covering [facility] know.

## 2020-10-21 NOTE — Significant Event (Signed)
Saw and evaluated patient in the ED.  Speaking in full sentences.  Normotensive.  Hemoglobin appears at baseline.  Irregularly irregular with runs of A. fib with RVR.  Endorses some orthostatic changes.  Bright red blood at home.  No further episodes observed in the ED.  He has ongoing decompensated cirrhosis.  Meld NA score 35 on my calculation.  He has known tumors in the liver.  Very poor prognosis.  There is no role for ICU at this time.  We are happy to reevaluate if he decompensates or has ongoing bleeding.  Strongly recommend palliative care.  Discussed with him that some of his goals may not be realistic and that we should work hard to see if we can identify realistic goals and aggressively achieve them.  Recommendations: --Continue octreotide, PPI infusion -- NPO midnight for EGD per  GI -- Please maintain at least 2 large-bore IVs -- Admit to stepdown under TRH  Please call us back if we can be of further assistance.

## 2020-10-21 NOTE — ED Notes (Signed)
Report attempted x2

## 2020-10-21 NOTE — Telephone Encounter (Signed)
Pt's spouse is requesting a call back from a nurse again.

## 2020-10-21 NOTE — ED Triage Notes (Addendum)
Patient arrived by [facility] from home. Patient experienced dizziness last night and had ongoing dizziness this am that caused a fall. Patient had 3 stools this am and the last stool dark and tarry. Denies abdominal pain but complains lower back pain. Last dialysis on Saturday and received last paracentesis this past Monday. Patient has had dark stools in hospital during last admission but required no blood. Patient also reports routinely high ammonia levels. Alert and oriented CBG 216. aFIB rate 140s and hx of same Patient received 500NS prior to arrival

## 2020-10-21 NOTE — Telephone Encounter (Signed)
Inbound call from patient wife, [name]. Patient was in hospital seeing Dr. Francia Greaves. States there was medicine prescribed but unable to get the medicine, Rifaximin 550MG  because of cost and Dr. Nevada Crane have not called back. Also patient is having dizziness to the point of almost falling. Patient wife would like a phone call instead of appointment at this time. Best contact [phone] to discuss her issue.

## 2020-10-21 NOTE — Telephone Encounter (Signed)
Patient's wife reports that her husband contacted her. He had fallen due to being dizzy at home.  She reports that when she got home he had an episode of bloody diarrhea.  Patient is dizzy but not confused.  She has called 911 and they are transporting him to [facility] hospital now via ambulance. She wanted Dr. Fuller Plan to be aware

## 2020-10-21 NOTE — ED Notes (Signed)
Report attempted 

## 2020-10-22 ENCOUNTER — Inpatient Hospital Stay (HOSPITAL_COMMUNITY): Payer: Medicare Other | Admitting: Certified Registered Nurse Anesthetist

## 2020-10-22 ENCOUNTER — Encounter (HOSPITAL_COMMUNITY): Payer: Self-pay | Admitting: Internal Medicine

## 2020-10-22 ENCOUNTER — Encounter (HOSPITAL_COMMUNITY)
Admission: EM | Disposition: A | Discharge status: Home Health | Payer: Self-pay | Source: Home / Self Care | Attending: Internal Medicine

## 2020-10-22 DIAGNOSIS — K922 Gastrointestinal hemorrhage, unspecified: Secondary | ICD-10-CM | POA: Diagnosis not present

## 2020-10-22 DIAGNOSIS — K7031 Alcoholic cirrhosis of liver with ascites: Secondary | ICD-10-CM | POA: Diagnosis not present

## 2020-10-22 DIAGNOSIS — C220 Liver cell carcinoma: Secondary | ICD-10-CM | POA: Diagnosis not present

## 2020-10-22 HISTORY — PX: ESOPHAGOGASTRODUODENOSCOPY (EGD) WITH PROPOFOL: SHX5813

## 2020-10-22 LAB — COMPREHENSIVE METABOLIC PANEL
ALT: 55 U/L — ABNORMAL HIGH (ref 0–44)
AST: 71 U/L — ABNORMAL HIGH (ref 15–41)
Albumin: 2.9 g/dL — ABNORMAL LOW (ref 3.5–5.0)
Alkaline Phosphatase: 88 U/L (ref 38–126)
Anion gap: 13 (ref 5–15)
BUN: 55 mg/dL — ABNORMAL HIGH (ref 8–23)
CO2: 23 mmol/L (ref 22–32)
Calcium: 9 mg/dL (ref 8.9–10.3)
Chloride: 96 mmol/L — ABNORMAL LOW (ref 98–111)
Creatinine, Ser: 5.15 mg/dL — ABNORMAL HIGH (ref 0.61–1.24)
GFR, Estimated: 11 mL/min — ABNORMAL LOW (ref >60)
Glucose, Bld: 126 mg/dL — ABNORMAL HIGH (ref 70–99)
Potassium: 5.1 mmol/L (ref 3.5–5.1)
Sodium: 132 mmol/L — ABNORMAL LOW (ref 135–145)
Total Bilirubin: 7.2 mg/dL — ABNORMAL HIGH (ref 0.3–1.2)
Total Protein: 6 g/dL — ABNORMAL LOW (ref 6.5–8.1)

## 2020-10-22 LAB — CBC
HCT: 24.6 % — ABNORMAL LOW (ref 39.0–52.0)
Hemoglobin: 8.3 g/dL — ABNORMAL LOW (ref 13.0–17.0)
MCH: 34.6 pg — ABNORMAL HIGH (ref 26.0–34.0)
MCHC: 33.7 g/dL (ref 30.0–36.0)
MCV: 102.5 fL — ABNORMAL HIGH (ref 80.0–100.0)
Platelets: 97 10*3/uL — ABNORMAL LOW (ref 150–400)
RBC: 2.4 MIL/uL — ABNORMAL LOW (ref 4.22–5.81)
RDW: 21.2 % — ABNORMAL HIGH (ref 11.5–15.5)
WBC: 7.3 10*3/uL (ref 4.0–10.5)
nRBC: 0.4 % — ABNORMAL HIGH (ref 0.0–0.2)

## 2020-10-22 LAB — HEMOGLOBIN AND HEMATOCRIT, BLOOD
HCT: 22.2 % — ABNORMAL LOW (ref 39.0–52.0)
HCT: 22.5 % — ABNORMAL LOW (ref 39.0–52.0)
Hemoglobin: 7.6 g/dL — ABNORMAL LOW (ref 13.0–17.0)
Hemoglobin: 7.6 g/dL — ABNORMAL LOW (ref 13.0–17.0)

## 2020-10-22 LAB — PROTIME-INR
INR: 1.9 — ABNORMAL HIGH (ref 0.8–1.2)
Prothrombin Time: 21.7 seconds — ABNORMAL HIGH (ref 11.4–15.2)

## 2020-10-22 LAB — PHOSPHORUS: Phosphorus: 8.1 mg/dL — ABNORMAL HIGH (ref 2.5–4.6)

## 2020-10-22 SURGERY — ESOPHAGOGASTRODUODENOSCOPY (EGD) WITH PROPOFOL
Anesthesia: Monitor Anesthesia Care

## 2020-10-22 MED ORDER — DARBEPOETIN ALFA 60 MCG/0.3ML IJ SOSY
60.0000 ug | PREFILLED_SYRINGE | INTRAMUSCULAR | Status: DC
  Filled 2020-10-22: qty 0.3

## 2020-10-22 MED ORDER — FENTANYL CITRATE (PF) 100 MCG/2ML IJ SOLN: 12.5000 ug | Freq: Once | INTRAMUSCULAR | Status: DC | PRN

## 2020-10-22 MED ORDER — SODIUM CHLORIDE 0.9 % IV SOLN: INTRAVENOUS | Status: DC | PRN

## 2020-10-22 MED ORDER — PHENYLEPHRINE HCL-NACL 10-0.9 MG/250ML-% IV SOLN
INTRAVENOUS | Status: DC | PRN
  Administered 2020-10-22: 25 ug/min via INTRAVENOUS

## 2020-10-22 MED ORDER — ACETAMINOPHEN 325 MG PO TABS
650.0000 mg | ORAL_TABLET | Freq: Once | ORAL | Status: AC | PRN
  Administered 2020-10-22: 650 mg via ORAL
  Filled 2020-10-22: qty 2

## 2020-10-22 MED ORDER — CHLORHEXIDINE GLUCONATE CLOTH 2 % EX PADS
6.0000 | MEDICATED_PAD | Freq: Every day | CUTANEOUS | Status: DC
  Administered 2020-10-22 – 2020-10-23 (×2): 6 via TOPICAL

## 2020-10-22 MED ORDER — MIDODRINE HCL 5 MG PO TABS
10.0000 mg | ORAL_TABLET | Freq: Three times a day (TID) | ORAL | Status: DC
  Administered 2020-10-22 (×4): 10 mg via ORAL
  Filled 2020-10-22 (×6): qty 2

## 2020-10-22 MED ORDER — CHLORHEXIDINE GLUCONATE CLOTH 2 % EX PADS
6.0000 | MEDICATED_PAD | Freq: Every day | CUTANEOUS | Status: DC
  Administered 2020-10-22 – 2020-10-24 (×3): 6 via TOPICAL

## 2020-10-22 MED ORDER — SUCRALFATE 1 GM/10ML PO SUSP
1.0000 g | Freq: Three times a day (TID) | ORAL | Status: DC
  Administered 2020-10-22 – 2020-10-28 (×25): 1 g via ORAL
  Filled 2020-10-22 (×27): qty 10

## 2020-10-22 MED ORDER — PROCHLORPERAZINE EDISYLATE 10 MG/2ML IJ SOLN
5.0000 mg | Freq: Four times a day (QID) | INTRAMUSCULAR | Status: DC | PRN
  Administered 2020-10-22: 5 mg via INTRAVENOUS
  Filled 2020-10-22: qty 1
  Filled 2020-10-22: qty 2

## 2020-10-22 MED ORDER — METOPROLOL TARTRATE 5 MG/5ML IV SOLN: 2.5000 mg | INTRAVENOUS | Status: DC | PRN

## 2020-10-22 MED ORDER — ACETAMINOPHEN 325 MG PO TABS
650.0000 mg | ORAL_TABLET | Freq: Four times a day (QID) | ORAL | Status: DC | PRN
  Administered 2020-10-22: 650 mg via ORAL
  Filled 2020-10-22: qty 2

## 2020-10-22 MED ORDER — PROPOFOL 500 MG/50ML IV EMUL
INTRAVENOUS | Status: DC | PRN
  Administered 2020-10-22: 100 ug/kg/min via INTRAVENOUS

## 2020-10-22 SURGICAL SUPPLY — 15 items

## 2020-10-22 NOTE — Progress Notes (Signed)
   10/21/20 2206  Assess: MEWS Score  Temp 97.9 F (36.6 C)  Pulse Rate (!) 127  ECG Heart Rate (!) 124  SpO2 97 %  O2 Device Room Air  Assess: MEWS Score  MEWS Temp 0  MEWS Systolic 0  MEWS Pulse 2  MEWS RR 0  MEWS LOC 0  MEWS Score 2  MEWS Score Color Yellow  Assess: if the MEWS score is Yellow or Red  Were vital signs taken at a resting state? Yes  Focused Assessment No change from prior assessment  Early Detection of Sepsis Score *See Row Information* Low  MEWS guidelines implemented *See Row Information* Yes  Treat  MEWS Interventions Escalated (See documentation below)  Take Vital Signs  Increase Vital Sign Frequency  Yellow: Q 2hr X 2 then Q 4hr X 2, if remains yellow, continue Q 4hrs  Escalate  MEWS: Escalate Yellow: discuss with charge nurse/RN and consider discussing with provider and RRT  Notify: Charge Nurse/RN  Name of Charge Nurse/RN Notified Yetta Glassman, RN  Date Charge Nurse/RN Notified 10/21/20  Time Charge Nurse/RN Notified 2230  Document  Patient Outcome Stabilized after interventions  Progress note created (see row info) Yes

## 2020-10-22 NOTE — Anesthesia Preprocedure Evaluation (Addendum)
Anesthesia Evaluation  Patient identified by MRN, date of birth, ID band Patient awake    Reviewed: Allergy & Precautions, NPO status , Patient's Chart, lab work & pertinent test results  Airway Mallampati: II  TM Distance: >3 FB Neck ROM: Full    Dental  (+) Teeth Intact, Dental Advisory Given   Pulmonary former smoker,    Pulmonary exam normal breath sounds clear to auscultation       Cardiovascular hypertension, Pt. on medications + dysrhythmias Atrial Fibrillation  Rhythm:Irregular Rate:Abnormal     Neuro/Psych PSYCHIATRIC DISORDERS (Delirium) negative neurological ROS     GI/Hepatic GERD  ,(+) Cirrhosis       , Hepatocellular carcinoma  GI bleed.  Blood loss anemia.  Known esophageal varices and portal hypertensive gastropathy.   Endo/Other  negative endocrine ROS  Renal/GU ESRF and DialysisRenal disease (TTHSAT)     Musculoskeletal negative musculoskeletal ROS (+)   Abdominal   Peds  Hematology  (+) Blood dyscrasia (Thrombocytopenia), anemia ,   Anesthesia Other Findings Day of surgery medications reviewed with the patient.  Reproductive/Obstetrics                           Anesthesia Physical Anesthesia Plan  ASA: IV  Anesthesia Plan: MAC   Post-op Pain Management:    Induction: Intravenous  PONV Risk Score and Plan: 1 and Propofol infusion and Treatment may vary due to age or medical condition  Airway Management Planned: Nasal Cannula and Natural Airway  Additional Equipment:   Intra-op Plan:   Post-operative Plan:   Informed Consent: I have reviewed the patients History and Physical, chart, labs and discussed the procedure including the risks, benefits and alternatives for the proposed anesthesia with the patient or authorized representative who has indicated his/her understanding and acceptance.   Patient has DNR.  Discussed DNR with patient and Suspend DNR.    Dental advisory given  Plan Discussed with: CRNA and Anesthesiologist  Anesthesia Plan Comments:        Anesthesia Quick Evaluation

## 2020-10-22 NOTE — Progress Notes (Signed)
[facility] Progress Note Patient Name: CEZAR MISIASZEK DOB: [birth year] MRN: [id number]   Date of Service  10/22/2020  HPI/Events of Note  Notified patient complaining of itchiness. He states he takes Tylenol for this. And this is confirmed on his home medications.  eICU Interventions  Ordered prn Tylenol. Not to give more than 2 g in 24 hours     Intervention Category Minor Interventions: Routine modifications to care plan (e.g. PRN medications for pain, fever)  Shona Needles Eevee Borbon 10/22/2020, 8:51 PM

## 2020-10-22 NOTE — Consult Note (Addendum)
NAME:  Dillon Perkins, MRN:  [id number], DOB:  11/12/1950, LOS: 1 ADMISSION DATE:  10/21/2020, CONSULTATION DATE:  4/19 REFERRING MD:  Dr. Sabino Gasser, CHIEF COMPLAINT:  GI Bleed   History of Present Illness:  70 year old male presents to ED on 4/18 with dizziness and blood in stools. On arrival to ED BP 82/65. HR 128. INR 2. Ammonia 83. Gi Consulted. Started on PPI, Octreotide, and Rocephin. Given 1 unit RBC.   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 Underwent EGD on 4/19. Post-op with hypotension requiring low dose NEO and transfer to ICU  Significant Hospital Events: Including procedures, antibiotic start and stop dates in addition to other pertinent events   . 4/18 > Admit GI bleed . EGD with duodenal ulcer > transferred to ICU for hypotension on NEO  Interim History / Subjective:  As above.   Objective   Blood pressure 97/62, pulse (!) 145, temperature 97.7 F (36.5 C), temperature source Oral, resp. rate (!) 25, height 5\' 8"  (1.727 m), weight 83 kg, SpO2 95 %.        Intake/Output Summary (Last 24 hours) at 10/22/2020 1443 Last data filed at 10/22/2020 1223 Gross per 24 hour  Intake 1213.06 ml  Output 100 ml  Net 1113.06 ml   Filed Weights   10/21/20 2206 10/22/20 1038  Weight: 83.6 kg 83 kg    Examination: General: adult male, no distress  HENT: Dry MM  Lungs: Clear breath sounds, no use of accessory muscles  Cardiovascular: Tachy, Irregular, no MRG Abdomen: mildly distended, non-tender, active bowel sounds  Extremities: -edema  Neuro: alert, oriented, follows commands  GU: deferred   Labs/imaging that I havepersonally reviewed  (right click and "Reselect all SmartList  Selections" daily)  Labs and imaging   Resolved Hospital Problem list     Assessment & Plan:   Acute GI Bleed secondary to duodenal ulcer s/p EGD 4/19  H/O ETOH Cirrhosis, Hepatocellular Carcinoma s/p Y90 and nibolumab  Portal HTN with esophageal varices  Plan -GI Following -Plans to continue PPI gtt for 72 hours followed by PPI BID  -Continue rifaximin  -Continue Rocephin for 5 days   Acute on Chronic Hypotension on Midodrine, in setting of GI bleed  Plan -Continue home Midodrine  -Titrate NEO for MAP goal 73-71 and systolic >06 (given port HTN and Cirrhosis)   Anemia secondary to Acute GI bleed Plan -Trend CBC. Maintain Hemoglobin >7  PAF with RVR -HR maintaining 120-140 Plan -Cardiac Monitoring -not on anticoagulation due to high risk bleeding   ESRD with HD T/R/S Plan -Nephrology Consulted  -Trend BMP  Best practice (right click and "Reselect all SmartList Selections" daily)  Diet:  NPO DVT prophylaxis: SCD GI prophylaxis: PPI Glucose control:  SSI No Central venous access:  N/A Arterial line:  N/A Foley:  N/A Mobility:  bed rest  PT consulted: N/A Last date of multidisciplinary goals of care discussion [N/A] Code Status:  DNR Disposition: Transfer to ICU   Labs   CBC: Recent Labs  Lab 10/21/20 1503 10/21/20 1533 10/22/20 0047 10/22/20 0812  WBC 10.8*  --  7.3  --   NEUTROABS 9.0*  --   --   --   HGB 7.7* 8.2* 8.3* 7.6*  HCT 24.1* 24.0* 24.6* 22.2*  MCV 112.6*  --  102.5*  --   PLT 126*  --  97*  --     Basic Metabolic Panel: Recent Labs  Lab 10/21/20 1503 10/21/20 1533 10/22/20 0047  NA 130* 132* 132*  K 4.5 4.6 5.1  CL 91* 94* 96*  CO2 21*  --  23  GLUCOSE 205* 186* 126*  BUN 48* 45* 55*  CREATININE 5.14* 4.90* 5.15*  CALCIUM 8.6*  --  9.0   GFR: Estimated Creatinine Clearance: 13.8 mL/min (A) (by C-G formula based on SCr of 5.15 mg/dL (H)). Recent Labs  Lab 10/21/20 1503 10/22/20 0047  WBC 10.8* 7.3    Liver Function  Tests: Recent Labs  Lab 10/21/20 1503 10/22/20 0047  AST 65* 71*  ALT 46* 55*  ALKPHOS 94 88  BILITOT 5.2* 7.2*  PROT 5.5* 6.0*  ALBUMIN 2.7* 2.9*   No results for input(s): LIPASE, AMYLASE in the last 168 hours. Recent Labs  Lab 10/21/20 1525  AMMONIA 83*    ABG    Component Value Date/Time   HCO3 21.1 10/09/2020 1412   TCO2 21 (L) 10/21/2020 1533   ACIDBASEDEF 2.0 10/09/2020 1412   O2SAT 97.0 10/09/2020 1412     Coagulation Profile: Recent Labs  Lab 10/21/20 1503 10/22/20 0047  INR 2.0* 1.9*    Cardiac Enzymes: No results for input(s): CKTOTAL, CKMB, CKMBINDEX, TROPONINI in the last 168 hours.  HbA1C: Hgb A1c MFr Bld  Date/Time Value Ref Range Status  07/22/2014 05:50 PM 5.7 (H) <5.7 % Final    Comment:    (NOTE)                                                                       According to the ADA Clinical Practice Recommendations for 2011, when HbA1c is used as a screening test:  >=6.5%   Diagnostic of Diabetes Mellitus           (if abnormal result is confirmed) 5.7-6.4%   Increased risk of developing Diabetes Mellitus References:Diagnosis and Classification of Diabetes Mellitus,Diabetes IDPO,2423,53(IRWER 1):S62-S69 and Standards of Medical Care in         Diabetes - 2011,Diabetes XVQM,0867,61 (Suppl 1):S11-S61.     CBG: No results for input(s): GLUCAP in the last 168 hours.  Review of Systems:   Denies SOB. Denies pain. +Dark Stools.   Past Medical History:  He,  has a past medical history of Alcoholic cirrhosis ([place]), Atrial fibrillation ([place]), End-stage renal disease on hemodialysis ([place]) (10/08/2020), GERD (gastroesophageal reflux disease), Hepatocellular carcinoma ([place]), History of chemotherapy (taking immunotherapy), History of kidney stones, Hyperlipidemia, and Hypertension.   Surgical History:   Past Surgical History:  Procedure Laterality Date  . APPENDECTOMY  yrs ago  . CYSTOSCOPY/URETEROSCOPY/HOLMIUM LASER/STENT PLACEMENT  Bilateral 05/22/2020   Procedure: CYSTOSCOPY/RETROGRADE/URETEROSCOPY/HOLMIUM LASER/STENT PLACEMENT STONE BASKET RETRIVAL ;  Surgeon: Ceasar Mons, MD;  [facility];  Service: Urology;  Laterality: Bilateral;  . IR ANGIOGRAM SELECTIVE EACH ADDITIONAL VESSEL  02/19/2020  . IR ANGIOGRAM SELECTIVE EACH ADDITIONAL VESSEL  02/19/2020  . IR ANGIOGRAM SELECTIVE EACH ADDITIONAL VESSEL  02/19/2020  . IR ANGIOGRAM SELECTIVE EACH ADDITIONAL VESSEL  02/19/2020  . IR ANGIOGRAM SELECTIVE EACH ADDITIONAL VESSEL  02/19/2020  . IR ANGIOGRAM  SELECTIVE EACH ADDITIONAL VESSEL  02/19/2020  . IR ANGIOGRAM SELECTIVE EACH ADDITIONAL VESSEL  03/01/2020  . IR ANGIOGRAM SELECTIVE EACH ADDITIONAL VESSEL  03/01/2020  . IR ANGIOGRAM SELECTIVE EACH ADDITIONAL VESSEL  03/01/2020  . IR ANGIOGRAM VISCERAL SELECTIVE  02/19/2020  . IR ANGIOGRAM VISCERAL SELECTIVE  03/01/2020  . IR EMBO ARTERIAL NOT HEMORR HEMANG INC GUIDE ROADMAPPING  02/19/2020  . IR EMBO TUMOR ORGAN ISCHEMIA INFARCT INC GUIDE ROADMAPPING  02/19/2020  . IR EMBO TUMOR ORGAN ISCHEMIA INFARCT INC GUIDE ROADMAPPING  03/01/2020  . IR PARACENTESIS  07/15/2020  . IR PARACENTESIS  08/02/2020  . IR PARACENTESIS  08/19/2020  . IR PARACENTESIS  09/09/2020  . IR PARACENTESIS  10/07/2020  . IR PARACENTESIS  10/10/2020  . IR PARACENTESIS  10/14/2020  . IR RADIOLOGIST EVAL & MGMT  01/30/2020  . IR US GUIDE VASC ACCESS RIGHT  02/19/2020  . IR US GUIDE VASC ACCESS RIGHT  03/01/2020     Social History:   reports that he quit smoking about 48 years ago. His smoking use included cigarettes. He has a 0.50 pack-year smoking history. He has never used smokeless tobacco. He reports previous alcohol use of about 2.0 standard drinks of alcohol per week. He reports that he does not use drugs.   Family History:  His family history includes Breast cancer in his mother; Heart disease (age of onset: 6) in his father; Heart disease (age of onset: 20) in his sister. There  is no history of Colon cancer, Stomach cancer, Pancreatic cancer, Rectal cancer, or Esophageal cancer.   Allergies Allergies  Allergen Reactions  . Losartan Other (See Comments) and Cough    Dizziness and possible cough   . Losartan Potassium Other (See Comments) and Cough    Dizziness and possible cough  . Other Other (See Comments)    External lotions for itching- INEFFECTIVE  . Rivaroxaban Other (See Comments)    Gross hematuria      Home Medications  Prior to Admission medications   Medication Sig Start Date End Date Taking? Authorizing Provider  acetaminophen (TYLENOL) 500 MG tablet Take 500-1,000 mg by mouth See admin instructions. Take 500-1,000 mg by mouth at bedtime as needed for mild pain from itching and an additional 500 mg once a day as needed for discomfort or headaches- max daily dosage of 1,500 mg from all sources   Yes [provider]  diltiazem (CARDIZEM) 30 MG tablet Take 0.5 tablets (15 mg total) by mouth every 12 (twelve) hours. Patient taking differently: Take 30 mg by mouth 3 (three) times daily. 10/14/20 01/12/21 Yes Hall, Lorenda Cahill, DO  feeding supplement (BOOST HIGH PROTEIN) LIQD Take 1 Container by mouth See admin instructions. Drink 1 container by mouth up to three times a day for supplementation   Yes [provider]  feeding supplement (ENSURE ENLIVE / ENSURE PLUS) LIQD Take 237 mLs by mouth 4 (four) times daily. 10/14/20  Yes Irene Pap N, DO  lactulose (CHRONULAC) 10 GM/15ML solution Take 30 mLs (20 g total) by mouth 3 (three) times daily. 10/21/20  Yes Ladene Artist, MD  midodrine (PROAMATINE) 10 MG tablet Take 1 tablet (10 mg total) by mouth 3 (three) times daily. 10/14/20 01/12/21 Yes Kayleen Memos, DO  multivitamin (RENA-VIT) TABS tablet Take 1 tablet by mouth at bedtime. 10/14/20 01/12/21 Yes Hall, Lorenda Cahill, DO  rifaximin (XIFAXAN) 550 MG TABS tablet Take 1 tablet (550 mg total) by mouth 2 (two) times daily for 12 doses. Samples  of this  drug were given to the patient, quantity 12, Lot Number [id number] exp 04/26 10/21/20 10/27/20 Yes Ladene Artist, MD  camphor-menthol [facility]) lotion Apply topically 2 (two) times daily. Patient not taking: No sig reported 10/14/20   Kayleen Memos, DO     Critical care time: 32 minutes     CRITICAL CARE Performed by: Omar Person   Total critical care time: 32 minutes  Critical care time was exclusive of separately billable procedures and treating other patients.  Critical care was necessary to treat or prevent imminent or life-threatening deterioration.  Critical care was time spent personally by me on the following activities: development of treatment plan with patient and/or surrogate as well as nursing, discussions with consultants, evaluation of patient's response to treatment, examination of patient, obtaining history from patient or surrogate, ordering and performing treatments and interventions, ordering and review of laboratory studies, ordering and review of radiographic studies, pulse oximetry and re-evaluation of patient's condition.

## 2020-10-22 NOTE — Anesthesia Procedure Notes (Signed)
Procedure Name: MAC Date/Time: 10/22/2020 12:12 PM Performed by: Kathryne Hitch, CRNA Pre-anesthesia Checklist: Patient identified, Emergency Drugs available, Suction available and Patient being monitored Patient Re-evaluated:Patient Re-evaluated prior to induction Oxygen Delivery Method: Nasal cannula Preoxygenation: Pre-oxygenation with 100% oxygen Induction Type: IV induction Placement Confirmation: positive ETCO2 Dental Injury: Teeth and Oropharynx as per pre-operative assessment

## 2020-10-22 NOTE — Progress Notes (Signed)
Pt arrived to Endo recovery at 1231 on Neo gtt at 25mcg/min with B/P 81/65 and HR 113. Pt's Neo gtt changed to 73mcg/min at 1248 B/P 90/57 (66) HR 128 and changed again at 1300 to 15 mcg/min B/P 82/64 (67) HR 128. At 1311 pt's B/P was 78/52 (60) HR 131 and neo gtt was increased back to 20 mcg/min. pts PO Midirine was given at this time. Dr. Silverio Decamp aware that pt is still on Neo gtt with systolic blood pressures in the 80's. Dr. Percival Spanish made aware as well. Per Dr. Silverio Decamp and attending team agreed to admit pt to ICU. Pt resting comfortably, alert and oriented with no complaints at this time. Awaiting ICU bed. Jobe Igo, RN

## 2020-10-22 NOTE — Anesthesia Postprocedure Evaluation (Addendum)
Anesthesia Post Note  Patient: Dillon Perkins  Procedure(s) Performed: ESOPHAGOGASTRODUODENOSCOPY (EGD) WITH PROPOFOL (N/A )     Patient location during evaluation: Endoscopy Anesthesia Type: MAC Level of consciousness: awake and alert Pain management: pain level controlled Vital Signs Assessment: post-procedure vital signs reviewed and stable Respiratory status: spontaneous breathing, nonlabored ventilation, respiratory function stable and patient connected to nasal cannula oxygen Cardiovascular status: blood pressure returned to baseline (Rapid a-fib unchanged from baseline) Postop Assessment: no apparent nausea or vomiting Anesthetic complications: no   No complications documented.  Last Vitals:  Vitals:   10/22/20 1420 10/22/20 1430  BP: (!) 89/64 97/62  Pulse: (!) 138 (!) 145  Resp: 14 (!) 25  Temp:    SpO2: 96% 95%    Last Pain:  Vitals:   10/22/20 1410  TempSrc:   PainSc: 0-No pain                 Catalina Gravel

## 2020-10-22 NOTE — Op Note (Addendum)
[facility] Patient Name: Dillon Perkins Procedure Date : 10/22/2020 MRN: [id number] Attending MD: Mauri Pole , MD Date of Birth: 12/04/1950 CSN: [id number] Age: 70 Admit Type: Inpatient Procedure:                Upper GI endoscopy Indications:              Active gastrointestinal bleeding, Suspected upper                            gastrointestinal bleeding, Gastrointestinal                            bleeding of unknown origin Providers:                Mauri Pole, MD, Jobe Igo, RN,                            Benetta Spar, Technician Referring MD:              Medicines:                Monitored Anesthesia Care Complications:            No immediate complications. Estimated Blood Loss:     Estimated blood loss: none. Procedure:                Pre-Anesthesia Assessment:                           - Prior to the procedure, a History and Physical                            was performed, and patient medications and                            allergies were reviewed. The patient's tolerance of                            previous anesthesia was also reviewed. The risks                            and benefits of the procedure and the sedation                            options and risks were discussed with the patient.                            All questions were answered, and informed consent                            was obtained. Prior Anticoagulants: The patient has                            taken no previous anticoagulant or antiplatelet                            agents. ASA Grade Assessment:  IV - A patient with                            severe systemic disease that is a constant threat                            to life. After reviewing the risks and benefits,                            the patient was deemed in satisfactory condition to                            undergo the procedure.                           After obtaining  informed consent, the endoscope was                            passed under direct vision. Throughout the                            procedure, the patient's blood pressure, pulse, and                            oxygen saturations were monitored continuously. The                            [id number]-[id number] ([id number] Olympus gastroscope was                            introduced through the mouth, and advanced to the                            second part of duodenum. The upper GI endoscopy was                            accomplished without difficulty. The patient                            tolerated the procedure well. Scope In: Scope Out: Findings:      There were esophageal mucosal changes suggestive of long-segment       Barrett's esophagus present in the lower third of the esophagus. The       maximum longitudinal extent of these mucosal changes was 5 cm in length.      Grade II varices were found in the lower third of the esophagus. They       were 5 mm in largest diameter. No stigmata of recent bleeding      A small amount of food (residue) was found in the gastric fundus.      Mild portal hypertensive gastropathy was found in the entire examined       stomach.      Three non-obstructing non-bleeding cratered duodenal ulcers with       pigmented material were found in the duodenal bulb. The largest lesion  was 12 mm in largest dimension. Likely source of GI bleed Impression:               - Esophageal mucosal changes suggestive of                            long-segment Barrett's esophagus.                           - Grade II esophageal varices.                           - A small amount of food (residue) in the stomach.                           - Portal hypertensive gastropathy.                           - Non-obstructing non-bleeding duodenal ulcers with                            pigmented material. Source of GI bleed. Did not                            require endoscopic  intervention                           - No specimens collected. Recommendation:           - Patient has a contact number available for                            emergencies. The signs and symptoms of potential                            delayed complications were discussed with the                            patient. Return to normal activities tomorrow.                            Written discharge instructions were provided to the                            patient.                           - clear liquid diet today and then slowly advance                            to soft diet as tolerated.                           - Continue PPI gtt for 72 hours and then transition                            to IV  BID                           - Carafate 1gm before meals and bedtime                           - Monitor Hgb q8h and tranfuse if below 7                           - Follow an antireflux regimen.                           - Avoid NSAID's                           - Continue ceftriaxone for 5 days                           - Avoid large volume paracentesis Procedure Code(s):        --- Professional ---                           [phone], Esophagogastroduodenoscopy, flexible,                            transoral; diagnostic, including collection of                            specimen(s) by brushing or washing, when performed                            (separate procedure) Diagnosis Code(s):        --- Professional ---                           K22.8, Other specified diseases of esophagus                           I85.00, Esophageal varices without bleeding                           K76.6, Portal hypertension                           K31.89, Other diseases of stomach and duodenum                           K26.9, Duodenal ulcer, unspecified as acute or                            chronic, without hemorrhage or perforation                           K92.2, Gastrointestinal hemorrhage,  unspecified CPT copyright 2019 [employer]. All rights reserved. The codes documented in this report are preliminary and upon coder review may  be revised to meet current compliance requirements. Mauri Pole, MD 10/22/2020 12:43:04 PM This report  has been signed electronically. Number of Addenda: 0

## 2020-10-22 NOTE — Interval H&P Note (Signed)
History and Physical Interval Note:  10/22/2020 11:54 AM  Dillon Perkins  has presented today for surgery, with the diagnosis of GI bleed.  Blood loss anemia.  Known esophageal varices and portal hypertensive gastropathy..  The various methods of treatment have been discussed with the patient and family. After consideration of risks, benefits and other options for treatment, the patient has consented to  Procedure(s): ESOPHAGOGASTRODUODENOSCOPY (EGD) WITH PROPOFOL (N/A) as a surgical intervention.  The patient's history has been reviewed, patient examined, no change in status, stable for surgery.  I have reviewed the patient's chart and labs.  Questions were answered to the patient's satisfaction.     [name]

## 2020-10-22 NOTE — Consult Note (Signed)
Renal Service Consult Note [facility]  JADAKISS BARISH 10/22/2020 Sol Blazing, MD Requesting Physician: Dr Sabino Gasser  Reason for Consult: ESRD pt w/  HPI: The patient is a 70 y.o. year-old hx of HTN, HL, hepatocellular carcinoma, ESRD on HD, alcoholic cirrhosis and atrial fib admitted yesterday 4/18 for fall, syncope and GI bleeding.  Pt w/ known cirrhosis/ ascites, getting paracentesis periodically. In ED BP's were soft in the 85 range, RR 21, HR 128 w/ afib, Na 130 cr 5.1 and K 4.5. Hb 7.7, tbili 5.2, alb 2.7  plt 126K  wBC 10K. CXR negative. INR 2.0.  Seen by GI and was given octreotide, po vit K x 3days, IV PPI infusion, and IV rocephin. Pt admitted. Asked to see for dialysis.   Pt seen in ICU.  BP's high 80's.  No c/o at this time, no abd pain or CP or fevers. Ascites being managed weekly now , last LVP was 9 days ago on Monday prior.   No issues w/ dialysis. Has R chest TDC.  Just started on dialysis 2-3 wks ago.      ROS  denies CP  no joint pain   no HA  no blurry vision  no rash  no diarrhea  no nausea/ vomiting  no dysuria  no difficulty voiding  no change in urine color    Past Medical History  Past Medical History:  Diagnosis Date  . Alcoholic cirrhosis ([place])   . Atrial fibrillation ([place])   . End-stage renal disease on hemodialysis ([place]) 10/08/2020  . GERD (gastroesophageal reflux disease)   . Hepatocellular carcinoma ([place])   . History of chemotherapy taking immunotherapy   last dose 05-13-2020  . History of kidney stones   . Hyperlipidemia   . Hypertension    Past Surgical History  Past Surgical History:  Procedure Laterality Date  . APPENDECTOMY  yrs ago  . CYSTOSCOPY/URETEROSCOPY/HOLMIUM LASER/STENT PLACEMENT Bilateral 05/22/2020   Procedure: CYSTOSCOPY/RETROGRADE/URETEROSCOPY/HOLMIUM LASER/STENT PLACEMENT STONE BASKET RETRIVAL ;  Surgeon: Ceasar Mons, MD;  Location: [facility];  Service: Urology;   Laterality: Bilateral;  . IR ANGIOGRAM SELECTIVE EACH ADDITIONAL VESSEL  02/19/2020  . IR ANGIOGRAM SELECTIVE EACH ADDITIONAL VESSEL  02/19/2020  . IR ANGIOGRAM SELECTIVE EACH ADDITIONAL VESSEL  02/19/2020  . IR ANGIOGRAM SELECTIVE EACH ADDITIONAL VESSEL  02/19/2020  . IR ANGIOGRAM SELECTIVE EACH ADDITIONAL VESSEL  02/19/2020  . IR ANGIOGRAM SELECTIVE EACH ADDITIONAL VESSEL  02/19/2020  . IR ANGIOGRAM SELECTIVE EACH ADDITIONAL VESSEL  03/01/2020  . IR ANGIOGRAM SELECTIVE EACH ADDITIONAL VESSEL  03/01/2020  . IR ANGIOGRAM SELECTIVE EACH ADDITIONAL VESSEL  03/01/2020  . IR ANGIOGRAM VISCERAL SELECTIVE  02/19/2020  . IR ANGIOGRAM VISCERAL SELECTIVE  03/01/2020  . IR EMBO ARTERIAL NOT HEMORR HEMANG INC GUIDE ROADMAPPING  02/19/2020  . IR EMBO TUMOR ORGAN ISCHEMIA INFARCT INC GUIDE ROADMAPPING  02/19/2020  . IR EMBO TUMOR ORGAN ISCHEMIA INFARCT INC GUIDE ROADMAPPING  03/01/2020  . IR PARACENTESIS  07/15/2020  . IR PARACENTESIS  08/02/2020  . IR PARACENTESIS  08/19/2020  . IR PARACENTESIS  09/09/2020  . IR PARACENTESIS  10/07/2020  . IR PARACENTESIS  10/10/2020  . IR PARACENTESIS  10/14/2020  . IR RADIOLOGIST EVAL & MGMT  01/30/2020  . IR US GUIDE VASC ACCESS RIGHT  02/19/2020  . IR US GUIDE VASC ACCESS RIGHT  03/01/2020   Family History  Family History  Problem Relation Age of Onset  . Heart disease Father 40  Heart attack  . Heart disease Sister 1       Heart attack  . Breast cancer Mother   . Colon cancer Neg Hx   . Stomach cancer Neg Hx   . Pancreatic cancer Neg Hx   . Rectal cancer Neg Hx   . Esophageal cancer Neg Hx    Social History  reports that he quit smoking about 48 years ago. His smoking use included cigarettes. He has a 0.50 pack-year smoking history. He has never used smokeless tobacco. He reports previous alcohol use of about 2.0 standard drinks of alcohol per week. He reports that he does not use drugs. Allergies  Allergies  Allergen Reactions  . Losartan Other (See Comments) and  Cough    Dizziness and possible cough   . Losartan Potassium Other (See Comments) and Cough    Dizziness and possible cough  . Other Other (See Comments)    External lotions for itching- INEFFECTIVE  . Rivaroxaban Other (See Comments)    Gross hematuria    Home medications Prior to Admission medications   Medication Sig Start Date End Date Taking? Authorizing Provider  acetaminophen (TYLENOL) 500 MG tablet Take 500-1,000 mg by mouth See admin instructions. Take 500-1,000 mg by mouth at bedtime as needed for mild pain from itching and an additional 500 mg once a day as needed for discomfort or headaches- max daily dosage of 1,500 mg from all sources   Yes [provider]  diltiazem (CARDIZEM) 30 MG tablet Take 0.5 tablets (15 mg total) by mouth every 12 (twelve) hours. Patient taking differently: Take 30 mg by mouth 3 (three) times daily. 10/14/20 01/12/21 Yes Hall, Lorenda Cahill, DO  feeding supplement (BOOST HIGH PROTEIN) LIQD Take 1 Container by mouth See admin instructions. Drink 1 container by mouth up to three times a day for supplementation   Yes [provider]  feeding supplement (ENSURE ENLIVE / ENSURE PLUS) LIQD Take 237 mLs by mouth 4 (four) times daily. 10/14/20  Yes Irene Pap N, DO  lactulose (CHRONULAC) 10 GM/15ML solution Take 30 mLs (20 g total) by mouth 3 (three) times daily. 10/21/20  Yes Ladene Artist, MD  midodrine (PROAMATINE) 10 MG tablet Take 1 tablet (10 mg total) by mouth 3 (three) times daily. 10/14/20 01/12/21 Yes Kayleen Memos, DO  multivitamin (RENA-VIT) TABS tablet Take 1 tablet by mouth at bedtime. 10/14/20 01/12/21 Yes Hall, Lorenda Cahill, DO  rifaximin (XIFAXAN) 550 MG TABS tablet Take 1 tablet (550 mg total) by mouth 2 (two) times daily for 12 doses. Samples of this drug were given to the patient, quantity 12, Lot Number [id number] exp 04/26 10/21/20 10/27/20 Yes Ladene Artist, MD  camphor-menthol [facility]) lotion Apply topically 2 (two) times daily. Patient  not taking: No sig reported 10/14/20   Kayleen Memos, DO     Vitals:   10/22/20 1153 10/22/20 1231 10/22/20 1235 10/22/20 1241  BP: (!) 79/57 (!) 81/65 (!) 74/58 102/61  Pulse: (!) 136 (!) 113 (!) 128 (!) 131  Resp: 13 (!) 24 14 (!) 24  Temp: 97.8 F (36.6 C) 97.7 F (36.5 C)    TempSrc: Oral Oral    SpO2: 96% 95% 95% 97%  Weight:      Height:       Exam Gen alert, no distress No rash, cyanosis or gangrene Sclera anicteric, throat clear  No jvd or bruits Chest mild crackles L base, R clear , no wheezing, no ^wob RRR no MRG  Abd soft ntnd no mass 2+ moderate ascites, nontender GU normal MS no joint effusions or deformity Ext no LE or UE edema, no wounds or ulcers Neuro is alert, Ox 3 , nf  RIJ TDC       Home meds:  - lactulose 29ml tid/ rifaximin 550 bid  - cardizem 30 tid  - ensure enlive qid/ boost high prot tid  - prn's/ vitamins/ supplements    OP HD: [facility] TTS   3.5h  300/600  77kg  Hep none   TDC  - no esa     CXR 4/18 - IMPRESSION: No evidence of acute cardiopulmonary disease.   Assessment/ Plan: 1. GI bleed - acute w/ burgundy stool, soft BP's here. SP 1u prbc's yest evening. Hb 7.6 this am. Per primary team/ GI.  Was to get procedure per GI this afternoon.  2. 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 Taking lactulose and rifaximin.  3. Ascites - recurrent, gets serial paracentesis prn. Moderate ascites on exam.  4. Afib / RVR - per primary team, on diltiazem 5. ESRD - recent start to dialysis about 2- 3wks ago. HD TTS. BP's soft post-procedure and labs are okay. Will hold off on HD until the morning tomorrow.  6. BP/ volume - BP's soft to low-normal. Not taking any BP lowering meds at home (cardizem is for afib). No LE edema, moderate ascites, clear CXR. Minimal UF w/ HD tomorrow.   7. Anemia ckd+ abl - not on any esa yest. Will start darbe 60 ug weekly here, 1st dose tomorrow w/ HD.  8. MBD ckd - Ca stable. Phos on recent admit was low in 3.3- 5.0  range. Repeat phos. No binders for now.       Rob Shwanda Soltis  MD 10/22/2020, 12:56 PM  Recent Labs  Lab 10/21/20 1503 10/21/20 1533 10/22/20 0047 10/22/20 0812  WBC 10.8*  --  7.3  --   HGB 7.7*   < > 8.3* 7.6*   < > = values in this interval not displayed.   Recent Labs  Lab 10/21/20 1503 10/21/20 1533 10/22/20 0047  K 4.5 4.6 5.1  BUN 48* 45* 55*  CREATININE 5.14* 4.90* 5.15*  CALCIUM 8.6*  --  9.0

## 2020-10-22 NOTE — Transfer of Care (Signed)
Immediate Anesthesia Transfer of Care Note  Patient: [name]  Procedure(s) Performed: ESOPHAGOGASTRODUODENOSCOPY (EGD) WITH PROPOFOL (N/A )  Patient Location: Endoscopy Unit  Anesthesia Type:MAC  Level of Consciousness: drowsy and patient cooperative  Airway & Oxygen Therapy: Patient Spontanous Breathing and Patient connected to nasal cannula oxygen  Post-op Assessment: Report given to RN and Post -op Vital signs reviewed and stable  Post vital signs: Reviewed and stable  Last Vitals:  Vitals Value Taken Time  BP 81/65 10/22/20 1231  Temp 36.5 C 10/22/20 1231  Pulse 113 10/22/20 1231  Resp 24 10/22/20 1231  SpO2 95 % 10/22/20 1231    Last Pain:  Vitals:   10/22/20 1231  TempSrc: Oral  PainSc: Asleep      Patients Stated Pain Goal: 0 (35/70/17 7939)  Complications: No complications documented.

## 2020-10-22 NOTE — Plan of Care (Signed)
Patient seen and rounded on this morning. See H&P for full initial plan. He has a history of alcoholic cirrhosis with portal HTN, severe portal hypertensive gastropathy, and esophageal varices.  Also history of [place] s/p radioembolization and immunotherapy with nivolumab.  He presented with bloody stools and concern for GIB.  Hgb was 7.7 g/dL on admission (down from baseline 10-11 g/dL).  Blood pressure was soft but expected in context of underlying significant cirrhosis (90s over 60s).  He underwent EGD on 10/22/2020 which showed grade 2 esophageal varices, mild portal hypertensive gastropathy involving the stomach, and large cratered duodenal ulcer that was nonbleeding. His blood pressure further decreased during procedure and required support of Neo-Synephrine.  He did not require intervention during EGD and may have hypoTN in setting of sedation plus possibly ongoing anemia (Hgb 7.6 g/dL this am).   Plan: - discussed with PCCM, while on vasopressor support patient will transfer to the ICU for management - continue trending H/H and transfuse as necessary/indicated - further management deferred to PCCM who will take over as primary team at this time; appreciate assistance in the care of this patient  Dwyane Dee, MD [facility] 10/22/2020, 1:51 PM

## 2020-10-22 NOTE — Progress Notes (Signed)
Report given to [place], [place]. Bonnita Nasuti, RN aware to call report to [id number] nurse when available. Jobe Igo, RN

## 2020-10-23 ENCOUNTER — Encounter (HOSPITAL_COMMUNITY): Payer: Self-pay | Admitting: Gastroenterology

## 2020-10-23 ENCOUNTER — Ambulatory Visit: Payer: PRIVATE HEALTH INSURANCE | Admitting: Physician Assistant

## 2020-10-23 ENCOUNTER — Telehealth: Payer: Self-pay

## 2020-10-23 DIAGNOSIS — C220 Liver cell carcinoma: Secondary | ICD-10-CM | POA: Diagnosis not present

## 2020-10-23 DIAGNOSIS — N186 End stage renal disease: Secondary | ICD-10-CM | POA: Diagnosis not present

## 2020-10-23 DIAGNOSIS — K922 Gastrointestinal hemorrhage, unspecified: Secondary | ICD-10-CM | POA: Diagnosis not present

## 2020-10-23 DIAGNOSIS — K7031 Alcoholic cirrhosis of liver with ascites: Secondary | ICD-10-CM | POA: Diagnosis not present

## 2020-10-23 LAB — TROPONIN I (HIGH SENSITIVITY): Troponin I (High Sensitivity): 4677 ng/L (ref <18)

## 2020-10-23 LAB — MAGNESIUM: Magnesium: 2.4 mg/dL (ref 1.7–2.4)

## 2020-10-23 LAB — CBC
HCT: 21.9 % — ABNORMAL LOW (ref 39.0–52.0)
Hemoglobin: 7.4 g/dL — ABNORMAL LOW (ref 13.0–17.0)
MCH: 35.4 pg — ABNORMAL HIGH (ref 26.0–34.0)
MCHC: 33.8 g/dL (ref 30.0–36.0)
MCV: 104.8 fL — ABNORMAL HIGH (ref 80.0–100.0)
Platelets: 100 10*3/uL — ABNORMAL LOW (ref 150–400)
RBC: 2.09 MIL/uL — ABNORMAL LOW (ref 4.22–5.81)
RDW: 22.3 % — ABNORMAL HIGH (ref 11.5–15.5)
WBC: 8.5 10*3/uL (ref 4.0–10.5)
nRBC: 0.2 % (ref 0.0–0.2)

## 2020-10-23 LAB — BASIC METABOLIC PANEL
Anion gap: 14 (ref 5–15)
BUN: 76 mg/dL — ABNORMAL HIGH (ref 8–23)
CO2: 23 mmol/L (ref 22–32)
Calcium: 8.8 mg/dL — ABNORMAL LOW (ref 8.9–10.3)
Chloride: 95 mmol/L — ABNORMAL LOW (ref 98–111)
Creatinine, Ser: 5.85 mg/dL — ABNORMAL HIGH (ref 0.61–1.24)
GFR, Estimated: 10 mL/min — ABNORMAL LOW (ref >60)
Glucose, Bld: 141 mg/dL — ABNORMAL HIGH (ref 70–99)
Potassium: 4.8 mmol/L (ref 3.5–5.1)
Sodium: 132 mmol/L — ABNORMAL LOW (ref 135–145)

## 2020-10-23 LAB — PHOSPHORUS: Phosphorus: 7.5 mg/dL — ABNORMAL HIGH (ref 2.5–4.6)

## 2020-10-23 MED ORDER — BOOST / RESOURCE BREEZE PO LIQD CUSTOM
1.0000 | Freq: Three times a day (TID) | ORAL | Status: DC
  Administered 2020-10-23 – 2020-10-28 (×13): 1 via ORAL

## 2020-10-23 MED ORDER — MIDODRINE HCL 5 MG PO TABS: 20.0000 mg | ORAL_TABLET | Freq: Three times a day (TID) | ORAL | Status: DC

## 2020-10-23 MED ORDER — MIDODRINE HCL 5 MG PO TABS
20.0000 mg | ORAL_TABLET | Freq: Three times a day (TID) | ORAL | Status: DC
  Administered 2020-10-23 – 2020-10-28 (×18): 20 mg via ORAL
  Filled 2020-10-23 (×17): qty 4

## 2020-10-23 MED ORDER — HEPARIN SODIUM (PORCINE) 1000 UNIT/ML IJ SOLN
3200.0000 [IU] | Freq: Once | INTRAMUSCULAR | Status: AC
  Administered 2020-10-23: 3200 [IU]

## 2020-10-23 MED ORDER — ALBUMIN HUMAN 25 % IV SOLN
INTRAVENOUS | Status: AC
  Administered 2020-10-23: 25 g
  Filled 2020-10-23: qty 100

## 2020-10-23 MED ORDER — LACTULOSE 10 GM/15ML PO SOLN
20.0000 g | Freq: Two times a day (BID) | ORAL | Status: DC
  Administered 2020-10-23 – 2020-10-28 (×11): 20 g via ORAL
  Filled 2020-10-23 (×11): qty 30

## 2020-10-23 MED ORDER — DARBEPOETIN ALFA 60 MCG/0.3ML IJ SOSY
PREFILLED_SYRINGE | INTRAMUSCULAR | Status: AC
  Filled 2020-10-23: qty 0.3

## 2020-10-23 MED ORDER — DARBEPOETIN ALFA 60 MCG/0.3ML IJ SOSY
60.0000 ug | PREFILLED_SYRINGE | INTRAMUSCULAR | Status: DC
  Administered 2020-10-23: 60 ug via INTRAVENOUS
  Filled 2020-10-23: qty 0.3

## 2020-10-23 MED ORDER — HEPARIN SODIUM (PORCINE) 1000 UNIT/ML IJ SOLN
INTRAMUSCULAR | Status: AC
  Filled 2020-10-23: qty 4

## 2020-10-23 MED ORDER — MORPHINE SULFATE (PF) 2 MG/ML IV SOLN
1.0000 mg | Freq: Once | INTRAVENOUS | Status: AC
  Administered 2020-10-23: 2 mg via INTRAVENOUS
  Filled 2020-10-23: qty 1

## 2020-10-23 MED ORDER — RENA-VITE PO TABS
1.0000 | ORAL_TABLET | Freq: Every day | ORAL | Status: DC
  Administered 2020-10-23 – 2020-10-27 (×5): 1 via ORAL
  Filled 2020-10-23 (×5): qty 1

## 2020-10-23 NOTE — Progress Notes (Addendum)
[facility] Progress Note  Subjective: seen in ICU, no new c/o's.   Vitals:   10/23/20 1200 10/23/20 1210 10/23/20 1225 10/23/20 1230  BP: (!) 84/51   (!) 84/62  Pulse: (!) 131 (!) 120 (!) 139 (!) 124  Resp: 12 17 17 19   Temp:      TempSrc:      SpO2: 95% 94% 95% 97%  Weight:      Height:        Exam:  alert, nad , chron ill appearing  no jvd  Chest cta bilat  Cor reg no RG  Abd soft ntnd 2+ moderate ascites   Ext no LE edema   Alert, NF, ox3    Home meds:  - lactulose 32ml tid/ rifaximin 550 bid  - cardizem 30 tid  - ensure enlive qid/ boost high prot tid  - prn's/ vitamins/ supplements    OP HD: [facility] TTS   3.5h  300/600  77kg  Hep none   TDC  - no esa     CXR 4/18 - IMPRESSION: No evidence of acute cardiopulmonary disease.   Assessment/ Plan: 1. GI bleed - acute, sp 1u prbc's 4/18. Hb 7.4 today. EGD 4/19 + for duodenal ulcers likely source of bleeding. Also mild portal HTN gastropathy, grade II esophageal varices and early Barrett's esophagus changes.  2. 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 Taking lactulose and rifaximin.  3. Ascites - recurrent, gets serial paracentesis prn. Moderate ascites on exam.  4. Afib / RVR - per primary team, on diltiazem 5. ESRD - recent start to dialysis in early April. Cont HD TTS. Missed HD yest, plan HD this am and again tomorrow to get back on schedule. Poor candidate for CRRT since will not change the long-term outcome.  6. Hypotension- most likely due to cirrhosis physiology.  BP's remain low, in 80's mostly. Is on midodrine 10 tid, will ^ to 20 mg tid. Mentation is wnl.  If hypotension worsens / does not improve we may not be able to do dialysis. I have discussed w/ the patient.  Pressors not recommended given DNR status/ multiple organ failure, etc.  7. Anemia ckd+ abl - not on any esa yest. Will start darbe 60 ug weekly here, 1st dose tomorrow w/ HD.  8. MBD ckd - Ca stable. Phos on recent admit was  low in 3.3- 5.0 range. Repeat phos. No binders for now.  9. GOC - pt is DNR   Kelly Splinter 10/23/2020, 1:00 PM   Recent Labs  Lab 10/22/20 0047 10/22/20 0812 10/22/20 1632 10/22/20 2357  K 5.1  --   --  4.8  BUN 55*  --   --  76*  CREATININE 5.15*  --   --  5.85*  CALCIUM 9.0  --   --  8.8*  PHOS  --   --  8.1* 7.5*  HGB 8.3*   < > 7.6* 7.4*   < > = values in this interval not displayed.   Inpatient medications: . Chlorhexidine Gluconate Cloth  6 each Topical Daily  . Chlorhexidine Gluconate Cloth  6 each Topical Q0600  . darbepoetin (ARANESP) injection - DIALYSIS  60 mcg Intravenous Q Wed-HD  . feeding supplement  1 Container Oral TID BM  . heparin sodium (porcine)  3,200 Units Intracatheter Once  . midodrine  20 mg Oral TID WC  . multivitamin  1 tablet Oral QHS  . [START ON 10/25/2020] pantoprazole  40 mg Intravenous  Q12H  . rifaximin  550 mg Oral BID  . sodium chloride flush  3 mL Intravenous Q12H  . sucralfate  1 g Oral TID WC & HS   . cefTRIAXone (ROCEPHIN)  IV Stopped (10/22/20 2124)  . pantoprozole (PROTONIX) infusion 8 mg/hr (10/23/20 0700)   acetaminophen, hydrocortisone cream, metoprolol tartrate, prochlorperazine

## 2020-10-23 NOTE — Telephone Encounter (Signed)
Received fax prior authorization for Xifaxan from cover my meds for patient. Initiated PA on cover my meds. Waiting on response.

## 2020-10-23 NOTE — Telephone Encounter (Signed)
Received fax denial of PA for Xifaxan. Per Christella Scheuermann the reason for denial is the use of this drug is not a diagnosis covered under the Medicare Part D benefit. Will fax appeal to appeal fax number at [phone]

## 2020-10-23 NOTE — Progress Notes (Addendum)
Initial Nutrition Assessment  DOCUMENTATION CODES:   Not applicable  INTERVENTION:    Boost Breeze po TID, each supplement provides 250 kcal and 9 grams of protein.  Renal MVI daily.  NUTRITION DIAGNOSIS:   Increased nutrient needs related to chronic illness (end stage cirrhosis, ESRD on HD) as evidenced by estimated needs.  GOAL:   Patient will meet greater than or equal to 90% of their needs  MONITOR:   Diet advancement,PO intake,Supplement acceptance,Labs  REASON FOR ASSESSMENT:   Malnutrition Screening Tool    ASSESSMENT:   70 yo male admitted with GI bleed. PMH includes end stage ETOH cirrhosis, ESRD on HD, portal hypertensive gastropathy, esophageal varices, hepatocellular carcinoma, A fib, HLD, HTN, GERD.   4/19 EGD showed duodenal ulcers as likely source of GI bleed.  Patient required transfer to the ICU due to hypotension after EGD procedure.   Labs reviewed. Na 132, phos 7.5  Medications reviewed and include Aranesp, Protonix, Carafate, IV Rocephin.  Patient was recently in the hospital, identified to have moderate malnutrition 2 weeks ago with moderate subcutaneous fat depletion and mild-moderate-severe depletion of muscle mass. Suspect this is ongoing.   Currently on a clear liquid diet. Meal intakes: 50% of breakfast today  Weight history reviewed. Weights have fluctuated from 81.9 kg to 94.3 kg since January 2022. Difficult to determine actual dry weight loss d/t fluid shifts with renal disease on HD and ascites. Patient receives paracenteses periodically. He just started HD 2-3 weeks ago.    Diet Order:   Diet Order            Diet clear liquid Room service appropriate? Yes; Fluid consistency: Thin  Diet effective now                 EDUCATION NEEDS:   Not appropriate for education at this time  Skin:  Skin Assessment: Reviewed RN Assessment  Last BM:  4/20 rectal tube  Height:   Ht Readings from Last 1 Encounters:  10/22/20 5\' 8"   (1.727 m)    Weight:   Wt Readings from Last 1 Encounters:  10/23/20 82.7 kg    BMI:  Body mass index is 27.72 kg/m.  Estimated Nutritional Needs:   Kcal:  9735-3299  Protein:  100-125 gm  Fluid:  1 L + UOP    Lucas Mallow, RD, LDN, CNSC Please refer to Amion for contact information.

## 2020-10-23 NOTE — Progress Notes (Signed)
PROGRESS NOTE    Dillon Perkins  AOZ:[id number] DOB: December 27, 1950 DOA: 10/21/2020 PCP: Antony Contras, MD   Brief Narrative:  Dillon Perkins is a 70 y.o. 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 He has not seen any other obvious bleeding including epistaxis, hemoptysis, hematemesis, hematuria.  He has not seen any dark black color stool.  He denies any nausea or vomiting.  He feels as if his abdomen is beginning to retain fluid again.  He denies any chest pain or dyspnea.  He has not seen any swelling in his lower extremities.  He reports adherence to dialysis with last treatment on 4/16.  He receives dialysis through a dialysis catheter.  Assessment & Plan:   Principal Problem:   Acute GI bleeding Active Problems:   Atrial fibrillation with RVR (HCC)   Hepatocellular carcinoma (HCC)   ESRD on dialysis ([place])   Alcoholic cirrhosis of liver with ascites ([place])    Goals of care Palliative care/advanced care planning - Acute on chronic hypotension in the setting of ESRD and advanced alcoholic cirrhosis (see below) -Patient's blood pressure continues to be somewhat labile, worsening today with dialysis -discussed with nephrology, if this continues to be an issue he will likely not be able to tolerate further dialysis which dictates a somewhat poor prognosis -Palliative care has been consulted to further discuss goals of care with patient, he is DNR he does understand he has advanced liver and kidney disease -unclear if his  current goals of returning home to be independent are reasonable.  His morbidity mortality greatly increase with discontinuation of dialysis -likely weeks rather than months or years. -Continue to follow closely, will likely have further discussion with patient and his family as his disease evolves.  Regardless if patient were to improve somewhat over the next few days his prognosis is still poor in the long-term and would benefit from advanced planning.  Acute GI bleed with symptomatic anemia Alcoholic cirrhosis Hepatocellular carcinoma s/p Y 90 and nivolumab Portal hypertension with esophageal varices: - GI following, appreciate assistance.   -Scope remarkable for questionable long segment Barrett's, grade 2 varices with a nonbleeding duodenal ulcers GI recommending clear liquids to advance diet as tolerated -Continue PPI gtt and ceftriaxone x5 days -Status post transfusion of 1 unit PRBC at admission - MELD score is 35 -poor prognostic indicator -Clear liquid diet now, n.p.o. after midnight -Continue rifaximin 550 mg twice daily -Vitamin K 10 mg daily x3 days per GI -Ammonia elevated however patient AAO x4 without evidence of hepatic encephalopathy, holding lactulose  Persistent atrial fibrillation with RVR: Chronically difficult to control A. fib due to sensitivity to medications.  Rate largely maintaining between 100-120 on admission, would not be too aggressive due to risk for hypotension.  Not on anticoagulation due to bleeding risk.  Can use IV metoprolol 2.5 mg if needed for persistent HR >120, hold for BP <90/60.  Atypical chest pain in the setting of hypotension, A. fib RVR  -Troponin elevated to 4000, cardiology sidelined, evaluated November 2021, was not a candidate for cardiac cath at that time and he is arguably a much worse  candidate now. -We will continue supportive care, attempt to maintain MAP greater than 65 which is likely his primary etiology for his chest pain given its  onset with hypotension. -EKG currently pending  ESRD on TTS HD: - Nephrology following, appreciate insight and recommendations -Discussion today about hypotension during dialysis, poor prognostic indicator, already on max dose midodrine per pharmacy recommendations.   DVT prophylaxis: SCDs Code Status: DNR, confirmed with patient Family Communication: None present  Status is: Inpatient  Dispo: The patient is from: Home              Anticipated d/c is to: To be determined              Anticipated d/c date is: 48-72h pending clinical course              Patient currently not medically stable for discharge  Consultants:   Nephrology, PCCM, GI  Procedures:   EGD, dialysis  Antimicrobials:  Ceftriaxone x5 days  Subjective: No acute issues or events overnight, patient feels quite well, somewhat fixated on paracentesis and dialysis planning otherwise denies nausea vomiting diarrhea constipation headache fevers or chills.  Objective: Vitals:   10/23/20 0600 10/23/20 0630 10/23/20 0700 10/23/20 0711  BP: (!) 86/56 93/68 90/64    Pulse: (!) 113 (!) 123 (!) 142   Resp: 14 11    Temp:    97.6 F (36.4 C)  TempSrc:    Oral  SpO2: 94% 93% 93%   Weight:      Height:        Intake/Output Summary (Last 24 hours) at 10/23/2020 5573 Last data filed at 10/23/2020 0700 Gross per 24 hour  Intake 1639.4 ml  Output 25 ml  Net 1614.4 ml   Filed Weights   10/21/20 2206 10/22/20 1038 10/23/20 0349  Weight: 83.6 kg 83 kg 82.7 kg    Examination:  General:  Pleasantly resting in bed, No acute distress. HEENT:  Normocephalic atraumatic.  Sclerae mildly icteric, noninjected.  Extraocular movements intact bilaterally. Neck:  Without mass or deformity.  Trachea is midline. Lungs:  Clear to auscultate bilaterally without rhonchi, wheeze, or rales. Heart:  Regular rate and rhythm.  Without murmurs, rubs, or gallops. Abdomen:  Soft, nontender, moderately distended.  Without guarding or  rebound. Extremities: Without cyanosis, clubbing, edema, or obvious deformity. Vascular:  Dorsalis pedis and posterior tibial pulses palpable bilaterally. Skin:  Warm and dry, no erythema, mild jaundice   Data Reviewed: I have personally reviewed following labs and imaging studies  CBC: Recent Labs  Lab 10/21/20 1503 10/21/20 1533 10/22/20 0047 10/22/20 0812 10/22/20 1632 10/22/20 2357  WBC 10.8*  --  7.3  --   --  8.5  NEUTROABS 9.0*  --   --   --   --   --   HGB 7.7* 8.2* 8.3* 7.6* 7.6* 7.4*  HCT 24.1* 24.0* 24.6* 22.2* 22.5* 21.9*  MCV 112.6*  --  102.5*  --   --  104.8*  PLT 126*  --  97*  --   --  220*   Basic Metabolic Panel: Recent Labs  Lab 10/21/20 1503 10/21/20 1533 10/22/20 0047 10/22/20 1632 10/22/20 2357  NA 130* 132* 132*  --  132*  K 4.5 4.6 5.1  --  4.8  CL 91* 94* 96*  --  95*  CO2 21*  --  23  --  23  GLUCOSE 205* 186* 126*  --  141*  BUN 48* 45* 55*  --  76*  CREATININE 5.14* 4.90* 5.15*  --  5.85*  CALCIUM 8.6*  --  9.0  --  8.8*  MG  --   --   --   --  2.4  PHOS  --   --   --  8.1* 7.5*   GFR: Estimated Creatinine Clearance: 12.1 mL/min (A) (by C-G formula based on SCr of 5.85 mg/dL (H)). Liver Function Tests: Recent Labs  Lab 10/21/20 1503 10/22/20 0047  AST 65* 71*  ALT 46* 55*  ALKPHOS 94 88  BILITOT 5.2* 7.2*  PROT 5.5* 6.0*  ALBUMIN 2.7* 2.9*   No results for input(s): LIPASE, AMYLASE in the last 168 hours. Recent Labs  Lab 10/21/20 1525  AMMONIA 83*   Coagulation Profile: Recent Labs  Lab 10/21/20 1503 10/22/20 0047  INR 2.0* 1.9*   Cardiac Enzymes: No results for input(s): CKTOTAL, CKMB, CKMBINDEX, TROPONINI in the last 168 hours. BNP (last 3 results) No results for input(s): PROBNP in the last 8760 hours. HbA1C: No results for input(s): HGBA1C in the last 72 hours. CBG: No results for input(s): GLUCAP in the last 168 hours. Lipid Profile: No results for input(s): CHOL, HDL, LDLCALC, TRIG, CHOLHDL, LDLDIRECT in  the last 72 hours. Thyroid Function Tests: No results for input(s): TSH, T4TOTAL, FREET4, T3FREE, THYROIDAB in the last 72 hours. Anemia Panel: No results for input(s): VITAMINB12, FOLATE, FERRITIN, TIBC, IRON, RETICCTPCT in the last 72 hours. Sepsis Labs: No results for input(s): PROCALCITON, LATICACIDVEN in the last 168 hours.  Recent Results (from the past 240 hour(s))  Resp Panel by RT-PCR (Flu A&B, Covid) Nasopharyngeal Swab     Status: None   Collection Time: 10/21/20  4:51 PM   Specimen: Nasopharyngeal Swab; Nasopharyngeal(NP) swabs in vial transport medium  Result Value Ref Range Status   SARS Coronavirus 2 by RT PCR NEGATIVE NEGATIVE Final    Comment: (NOTE) SARS-CoV-2 target nucleic acids are NOT DETECTED.  The SARS-CoV-2 RNA is generally detectable in upper respiratory specimens during the acute phase of infection. The lowest concentration of SARS-CoV-2 viral copies this assay can detect is 138 copies/mL. A negative result does not preclude SARS-Cov-2 infection and should not be used as the sole basis for treatment or other patient management decisions. A negative result may occur with  improper specimen collection/handling, submission of specimen other than nasopharyngeal swab, presence of viral mutation(s) within the areas targeted by this assay, and inadequate number of viral copies(<138 copies/mL). A negative result must be combined with clinical observations, patient history, and epidemiological information. The expected result is Negative.  Fact Sheet for Patients:  EntrepreneurPulse.com.au  Fact Sheet for Healthcare Providers:  IncredibleEmployment.be  This test is no t yet approved or cleared by the [place] FDA and  has been authorized for detection and/or diagnosis of SARS-CoV-2 by FDA under an Emergency Use Authorization (EUA). This EUA will remain  in effect (meaning this test can be used) for the duration of  the COVID-19 declaration under Section 564(b)(1) of the Act, 21 U.S.C.section 360bbb-3(b)(1), unless the authorization is terminated  or revoked sooner.       Influenza A by PCR NEGATIVE NEGATIVE Final   Influenza B by PCR NEGATIVE NEGATIVE Final    Comment: (NOTE) The Xpert Xpress SARS-CoV-2/FLU/RSV plus assay is intended as an aid in the diagnosis of influenza from Nasopharyngeal swab specimens and should not be used as a sole basis for treatment. Nasal washings and aspirates are unacceptable for Xpert Xpress SARS-CoV-2/FLU/RSV testing.  Fact Sheet for Patients:  EntrepreneurPulse.com.au  Fact Sheet for Healthcare Providers: IncredibleEmployment.be  This test is not yet approved or cleared by the [place] FDA and has been authorized for detection and/or diagnosis of SARS-CoV-2 by FDA under an Emergency Use Authorization (EUA). This EUA will remain in effect (meaning this test can be used) for the duration of the COVID-19 declaration under Section 564(b)(1) of the Act, 21 U.S.C. section 360bbb-3(b)(1), unless the authorization is terminated or revoked.  Performed at [facility], [place] [address], [place], [place] [address]      Radiology Studies: DG Chest 1 View  Result Date: 10/21/2020 CLINICAL DATA:  Fall, dizziness, low back pain EXAM: CHEST  1 VIEW COMPARISON:  10/09/2020 FINDINGS: Lungs are clear.  No pleural effusion or pneumothorax. The heart is top-normal in size. Right chest dual lumen dialysis catheter. IMPRESSION: No evidence of acute cardiopulmonary disease. Electronically Signed   By: Julian Hy M.D.   On: 10/21/2020 15:54   Scheduled Meds: . Chlorhexidine Gluconate Cloth  6 each Topical Daily  . Chlorhexidine Gluconate Cloth  6 each Topical Q0600  . darbepoetin (ARANESP) injection - DIALYSIS  60 mcg Intravenous Q Tue-HD  . midodrine  10 mg Oral TID WC  . [START ON 10/25/2020] pantoprazole  40 mg Intravenous  Q12H  . phytonadione  10 mg Oral Daily  . rifaximin  550 mg Oral BID  . sodium chloride flush  3 mL Intravenous Q12H  . sucralfate  1 g Oral TID WC & HS   Continuous Infusions: . cefTRIAXone (ROCEPHIN)  IV Stopped (10/22/20 2124)  . pantoprozole (PROTONIX) infusion 8 mg/hr (10/23/20 0700)     LOS: 2 days   Time spent: 31min  Yoneko Talerico C Daniell Paradise, DO [facility]  If 7PM-7AM, please contact night-coverage [email or web address]  10/23/2020, 7:12 AM

## 2020-10-23 NOTE — Progress Notes (Addendum)
Daily Rounding Note  10/23/2020, 1:17 PM  LOS: 2 days   SUBJECTIVE:   Chief complaint:      Transferred yesterday afternoon to 20M, medical ICU from unit 6 E.   Following the EGD yesterday patient developed hypotension requiring pressure support with Neo-Synephrine during EGD.  Was transferred to the ICU for close management. The neo was not in use by the time pt seen by CCM yesterday PM Smear burgundy stool this AM.    Developed central chest pressure at rest this Am Belly not feeling tight, no nausea.    OBJECTIVE:         Vital signs in last 24 hours:    Temp:  [97.3 F (36.3 C)-98.2 F (36.8 C)] 97.4 F (36.3 C) (04/20 1100) Pulse Rate:  [84-174] 124 (04/20 1230) Resp:  [11-30] 19 (04/20 1230) BP: (64-104)/(48-79) 72/56 (04/20 1300) SpO2:  [80 %-98 %] 97 % (04/20 1230) Weight:  [82.7 kg-83 kg] 83 kg (04/20 1058) Last BM Date: 10/23/20 Filed Weights   10/22/20 1038 10/23/20 0349 10/23/20 1058  Weight: 83 kg 82.7 kg 83 kg   General: Chronically and acutely ill-appearing.  Less engaged and less agitated/anxious than yesterday. Heart: RRR. Chest: No labored breathing or cough. Abdomen: Soft.  Slight to moderate distention/protuberance.  No tenderness.  Bowel sounds active Extremities: No CCE. Neuro/Psych: Affect blunted, disengaged but will answer questions with simple few word answers.  Slight asterixis.  Moves all 4 limbs.  Intake/Output from previous day: 04/19 0701 - 04/20 0700 In: 1639.4 [I.V.:1539.3; IV Piggyback:100.1] Out: 25 [Urine:25]  Intake/Output this shift: Total I/O In: 203 [P.O.:200; I.V.:3] Out: 50 [Urine:50]  Lab Results: Recent Labs    10/21/20 1503 10/21/20 1533 10/22/20 0047 10/22/20 0812 10/22/20 1632 10/22/20 2357  WBC 10.8*  --  7.3  --   --  8.5  HGB 7.7*   < > 8.3* 7.6* 7.6* 7.4*  HCT 24.1*   < > 24.6* 22.2* 22.5* 21.9*  PLT 126*  --  97*  --   --  100*   < > = values in  this interval not displayed.   BMET Recent Labs    10/21/20 1503 10/21/20 1533 10/22/20 0047 10/22/20 2357  NA 130* 132* 132* 132*  K 4.5 4.6 5.1 4.8  CL 91* 94* 96* 95*  CO2 21*  --  23 23  GLUCOSE 205* 186* 126* 141*  BUN 48* 45* 55* 76*  CREATININE 5.14* 4.90* 5.15* 5.85*  CALCIUM 8.6*  --  9.0 8.8*   LFT Recent Labs    10/21/20 1503 10/22/20 0047  PROT 5.5* 6.0*  ALBUMIN 2.7* 2.9*  AST 65* 71*  ALT 46* 55*  ALKPHOS 94 88  BILITOT 5.2* 7.2*   PT/INR Recent Labs    10/21/20 1503 10/22/20 0047  LABPROT 23.1* 21.7*  INR 2.0* 1.9*   Hepatitis Panel No results for input(s): HEPBSAG, HCVAB, HEPAIGM, HEPBIGM in the last 72 hours.  Studies/Results: DG Chest 1 View  Result Date: 10/21/2020 CLINICAL DATA:  Fall, dizziness, low back pain EXAM: CHEST  1 VIEW COMPARISON:  10/09/2020 FINDINGS: Lungs are clear.  No pleural effusion or pneumothorax. The heart is top-normal in size. Right chest dual lumen dialysis catheter. IMPRESSION: No evidence of acute cardiopulmonary disease. Electronically Signed   By: Julian Hy M.D.   On: 10/21/2020 15:54   Scheduled Meds: . Chlorhexidine Gluconate Cloth  6 each Topical Daily  . Chlorhexidine Gluconate Cloth  6 each Topical Q0600  . darbepoetin (ARANESP) injection - DIALYSIS  60 mcg Intravenous Q Wed-HD  . feeding supplement  1 Container Oral TID BM  . heparin sodium (porcine)  3,200 Units Intracatheter Once  . midodrine  20 mg Oral TID WC  . multivitamin  1 tablet Oral QHS  . [START ON 10/25/2020] pantoprazole  40 mg Intravenous Q12H  . rifaximin  550 mg Oral BID  . sodium chloride flush  3 mL Intravenous Q12H  . sucralfate  1 g Oral TID WC & HS   Continuous Infusions: . cefTRIAXone (ROCEPHIN)  IV Stopped (10/22/20 2124)  . pantoprozole (PROTONIX) infusion 8 mg/hr (10/23/20 0700)   PRN Meds:.acetaminophen, hydrocortisone cream, metoprolol tartrate, prochlorperazine   ASSESMENT:   *   GI bleed.  Presented with  hematochezia 10/22/2020 EGD showing mucosa consistent with long segment Barrett's.  Nonbleeding, grade 2 distal esophageal varices.  Small amount of food residue in the gastric fundus.  Nonbleeding, mild portal hypertensive gastropathy.  Nonbleeding duodenal ulcers containing pigmented material, these are source of GI bleed.  Did not require endoscopic intervention. Day 3/5 Rocephin Protonix drip in place.  72 hours finished tomorrow at about 1700. AC/hs Carafate in place.  *    Anemia Hgb 8.3 >> 7.6 >> 1 PRBC >> 7.4. Aranesp weekly in place.  *    Thrombocytopenia.  Platelets 100 K.  *   Coagulopathy.  A 3/3 oral vitamin K.  INR 1.9.  *    ESRD.  Initiated hemodialysis 10/09/2020.  Missed dialysis session 4/18, now receiving first dialysis since 4/16.  *    Hyponatremia.  Sodium stable at 132.  *    Hepatic encephalopathy.  Xifaxan in place.  Lactulose on hold due to presenting with hematochezia.  Affect less engaged, slight asterixis suggests worsening encephalopathy  *   Acute on chronic hypotension.  On midodrine at baseline.  Neo-Synephrine off after use during egd yest.  *    resting chest pressure.  Troponins 4677! ECG completed, not released   *    Ascites.  No history of SBP.  Last paracentesis 4/11, 4.6 L removed.  By exam today does not look like he needs paracentesis.    PLAN   *   Complete 72 hours of PPI drip.  At that point can probably switch to Protonix 40 po bid.  *  Advance diet  *    Restarted lactulose 20 g bid, at home take same dose but 3 times daily.  *    complete 5 days Rocephin   Azucena Freed  10/23/2020, 1:17 PM Phone [phone]   Attending physician's note   I have taken an interval history, reviewed the chart and examined the patient. I agree with the Advanced Practitioner's note, impression and recommendations.    Complains of chest pain, troponin elevated to > 4000 Palliative care and PCCM discussing goals of care  Hemoglobin remained  stable PPI twice daily  Lactulose 20 cc 2-3 times daily  Ascites: Not tense, moderate Plan to hold LVP for now  GI is available if needed, please call with any questions  I have spent 35 minutes of patient care (this includes precharting, chart review, review of results, face-to-face time used for counseling as well as treatment plan and follow-up. The patient was provided an opportunity to ask questions and all were answered. The patient agreed with the plan and demonstrated an understanding of the instructions.  Damaris Hippo , MD [phone]

## 2020-10-24 ENCOUNTER — Inpatient Hospital Stay (HOSPITAL_COMMUNITY): Payer: Medicare Other

## 2020-10-24 DIAGNOSIS — I4891 Unspecified atrial fibrillation: Secondary | ICD-10-CM

## 2020-10-24 DIAGNOSIS — K7031 Alcoholic cirrhosis of liver with ascites: Secondary | ICD-10-CM | POA: Diagnosis not present

## 2020-10-24 DIAGNOSIS — Z515 Encounter for palliative care: Secondary | ICD-10-CM

## 2020-10-24 DIAGNOSIS — R188 Other ascites: Secondary | ICD-10-CM

## 2020-10-24 DIAGNOSIS — K922 Gastrointestinal hemorrhage, unspecified: Secondary | ICD-10-CM

## 2020-10-24 DIAGNOSIS — K7011 Alcoholic hepatitis with ascites: Secondary | ICD-10-CM

## 2020-10-24 DIAGNOSIS — N186 End stage renal disease: Secondary | ICD-10-CM | POA: Diagnosis not present

## 2020-10-24 DIAGNOSIS — C220 Liver cell carcinoma: Secondary | ICD-10-CM | POA: Diagnosis not present

## 2020-10-24 DIAGNOSIS — K264 Chronic or unspecified duodenal ulcer with hemorrhage: Principal | ICD-10-CM

## 2020-10-24 DIAGNOSIS — Z7189 Other specified counseling: Secondary | ICD-10-CM

## 2020-10-24 HISTORY — PX: IR PARACENTESIS: IMG2679

## 2020-10-24 LAB — CBC
HCT: 20.3 % — ABNORMAL LOW (ref 39.0–52.0)
Hemoglobin: 6.8 g/dL — CL (ref 13.0–17.0)
MCH: 35.8 pg — ABNORMAL HIGH (ref 26.0–34.0)
MCHC: 33.5 g/dL (ref 30.0–36.0)
MCV: 106.8 fL — ABNORMAL HIGH (ref 80.0–100.0)
Platelets: 108 10*3/uL — ABNORMAL LOW (ref 150–400)
RBC: 1.9 MIL/uL — ABNORMAL LOW (ref 4.22–5.81)
RDW: 22.3 % — ABNORMAL HIGH (ref 11.5–15.5)
WBC: 8.7 10*3/uL (ref 4.0–10.5)
nRBC: 0.8 % — ABNORMAL HIGH (ref 0.0–0.2)

## 2020-10-24 LAB — BASIC METABOLIC PANEL
Anion gap: 10 (ref 5–15)
BUN: 46 mg/dL — ABNORMAL HIGH (ref 8–23)
CO2: 26 mmol/L (ref 22–32)
Calcium: 8.8 mg/dL — ABNORMAL LOW (ref 8.9–10.3)
Chloride: 99 mmol/L (ref 98–111)
Creatinine, Ser: 4.32 mg/dL — ABNORMAL HIGH (ref 0.61–1.24)
GFR, Estimated: 14 mL/min — ABNORMAL LOW (ref >60)
Glucose, Bld: 154 mg/dL — ABNORMAL HIGH (ref 70–99)
Potassium: 4 mmol/L (ref 3.5–5.1)
Sodium: 135 mmol/L (ref 135–145)

## 2020-10-24 LAB — GLUCOSE, CAPILLARY: Glucose-Capillary: 92 mg/dL (ref 70–99)

## 2020-10-24 LAB — HEMOGLOBIN AND HEMATOCRIT, BLOOD
HCT: 23.4 % — ABNORMAL LOW (ref 39.0–52.0)
Hemoglobin: 7.9 g/dL — ABNORMAL LOW (ref 13.0–17.0)

## 2020-10-24 LAB — PREPARE RBC (CROSSMATCH)

## 2020-10-24 MED ORDER — CHLORHEXIDINE GLUCONATE 0.12 % MT SOLN
15.0000 mL | Freq: Two times a day (BID) | OROMUCOSAL | Status: DC
  Administered 2020-10-24 – 2020-10-28 (×8): 15 mL via OROMUCOSAL
  Filled 2020-10-24 (×6): qty 15

## 2020-10-24 MED ORDER — SODIUM CHLORIDE 0.9% IV SOLUTION: Freq: Once | INTRAVENOUS | Status: AC

## 2020-10-24 MED ORDER — LIDOCAINE HCL 1 % IJ SOLN
INTRAMUSCULAR | Status: AC | PRN
  Administered 2020-10-24: 10 mL

## 2020-10-24 MED ORDER — SODIUM CHLORIDE 0.9% FLUSH: 10.0000 mL | INTRAVENOUS | Status: DC | PRN

## 2020-10-24 MED ORDER — SODIUM CHLORIDE 0.9% FLUSH
10.0000 mL | Freq: Two times a day (BID) | INTRAVENOUS | Status: DC
  Administered 2020-10-25 – 2020-10-28 (×3): 10 mL

## 2020-10-24 MED ORDER — LIDOCAINE HCL 1 % IJ SOLN
INTRAMUSCULAR | Status: AC
  Filled 2020-10-24: qty 20

## 2020-10-24 MED ORDER — ORAL CARE MOUTH RINSE
15.0000 mL | Freq: Two times a day (BID) | OROMUCOSAL | Status: DC
  Administered 2020-10-25 – 2020-10-27 (×5): 15 mL via OROMUCOSAL

## 2020-10-24 NOTE — Progress Notes (Signed)
[facility] Progress Note  Subjective: seen in ICU, no new c/o's.   Vitals:   10/24/20 1000 10/24/20 1042 10/24/20 1058 10/24/20 1100  BP: 115/85 115/85 117/80 109/73  Pulse: (!) 118 (!) 118 (!) 103 (!) 108  Resp: 15 15 16 16   Temp:  97.6 F (36.4 C)    TempSrc:  Oral    SpO2: 96% 97%    Weight:  84 kg    Height:        Exam:  alert, nad , chron ill appearing  no jvd  Chest cta bilat  Cor reg no RG  Abd soft ntnd 2-3+  ascites   Ext no LE edema   Alert, NF, ox3    Home meds:  - lactulose 52ml tid/ rifaximin 550 bid  - cardizem 30 tid  - ensure enlive qid/ boost high prot tid  - prn's/ vitamins/ supplements    OP HD: [facility] TTS   3.5h  300/600  77kg  Hep none   TDC  - no esa     CXR 4/18 - IMPRESSION: No evidence of acute cardiopulmonary disease.   Assessment/ Plan: 1. GI bleed - acute, sp 1u prbc's 4/18. Hb 7.4 today. EGD 4/19 + for duodenal ulcers likely source of bleeding. Also mild portal HTN gastropathy, grade II esophageal varices and early Barrett's esophagus changes.  2. 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 Taking lactulose and rifaximin.  3. Ascites - recurrent, now is getting weekly paracentesis as OP. Inpatient LVP per pmd.  4. Afib / RVR - per primary team, on diltiazem 5. ESRD - recent start to dialysis in early April. Poor candidate for CRRT since will not change the long-term outcome. Cont HD TTS. HD today.  6. Hypotension- most likely due to cirrhosis physiology +/- anemia.  BP's a bit better on higher-dose midodrine. Mentation is wnl. Pressors not recommended given DNR status/ multiple organ failure, etc. Pt and wife are aware that prognosis w/ regards to dialysis is not good given end-stage liver disease and it's complications.  7. Anemia ckd+ abl - started darbe 60 ug weekly here (rec'd on 4/20).  8. MBD ckd - Ca stable. Phos on recent admit was low in 3.3- 5.0 range. Repeat phos. No binders for now.  9. GOC - pt is DNR.  Palliative care consultation is pending. Prognosis is very poor.    Rob Anita Laguna 10/24/2020, 11:14 AM   Recent Labs  Lab 10/22/20 1632 10/22/20 2357 10/24/20 0354  K  --  4.8 4.0  BUN  --  76* 46*  CREATININE  --  5.85* 4.32*  CALCIUM  --  8.8* 8.8*  PHOS 8.1* 7.5*  --   HGB 7.6* 7.4* 6.8*   Inpatient medications: . Chlorhexidine Gluconate Cloth  6 each Topical Daily  . Chlorhexidine Gluconate Cloth  6 each Topical Q0600  . darbepoetin (ARANESP) injection - DIALYSIS  60 mcg Intravenous Q Wed-HD  . feeding supplement  1 Container Oral TID BM  . lactulose  20 g Oral BID  . midodrine  20 mg Oral TID WC  . multivitamin  1 tablet Oral QHS  . [START ON 10/25/2020] pantoprazole  40 mg Intravenous Q12H  . rifaximin  550 mg Oral BID  . sodium chloride flush  10-40 mL Intracatheter Q12H  . sodium chloride flush  3 mL Intravenous Q12H  . sucralfate  1 g Oral TID WC & HS   . cefTRIAXone (ROCEPHIN)  IV Stopped (10/23/20 1755)  .  pantoprozole (PROTONIX) infusion 8 mg/hr (10/24/20 0800)   acetaminophen, hydrocortisone cream, metoprolol tartrate, prochlorperazine, sodium chloride flush

## 2020-10-24 NOTE — Consult Note (Signed)
Consultation Note Date: 10/24/2020   Patient Name: Dillon Perkins  DOB: [birth year]  MRN: [id number]  Age / Sex: 70 y.o., male  PCP: Antony Contras, MD Referring Physician: Little Ishikawa, MD  Reason for Consultation: Establishing goals of care "ESRD, approaching end stage liver"  HPI/Patient Profile: 70 y.o. 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 Recently hospitalized 4/6-4/11 with acute encephalopathy and elevated ammonia level.  He presented to the emergency department on 10/21/2020 with dizziness and blood in his stool. In the ED, he was started on protonix infusion, octreotide, and vitamin K. He was admitted to [facility] with acute GI bleed with symptomatic anemia.   EGD 4/19 showed non-obstructing non-bleeding duodenal ulcers as the source of GI bleeding. Post procedure patient developed hypotension requiring low dose NEO and transfer to ICU.   MELD score on admission of 35.   Clinical Assessment and Goals of Care: I have reviewed medical records including EPIC notes, labs and imaging, and received report from bedside RN.   I met with wife/[name] to discuss diagnosis, prognosis, GOC, EOL wishes, disposition, and options. She met me outside patient's room and requests that we speak privately first.   Patient is known to PMT - he was followed by our service during his recent hospitalization. I revewied Palliative Medicine as specialized medical care for people living with serious illness. It focuses on providing relief from the symptoms and stress of a serious illness.   Brief life review of the patient. Married to wife  Dillon Perkins for 34 years. They have 3 sons and 4 grandchildren. He is a retired Hotel manager. Dillon Perkins shares that he is "very strong in his faith". He practices the [employer].  Since the last hospitalization, Dillon Perkins reports it had become increasingly difficult to care for him at home. As far as functional and nutritional status, there has been a significant decline. [name] verbalizes understanding that this will only continue to worsen.   We discussed his current illness and what it means in the larger context of his ongoing co-morbidities.  Natural disease trajectory of end stage liver disease was discussed. Dillon Perkins shares that [name] had verbalized today to their son that "he is going to die soon" and was asking their son to help look after Dillon Perkins after he is gone. We discussed the possibility that [name] may not stabilize enough to leave the hospital or would further decline. In this event, the recommendation would be transition to comfort care in-house.   In the event [name] becomes stable enough for discharge, Dillon Perkins expresses concern about caring for him at home. We discussed various disposition options--SNF/rehab versus home health with PT versus home with hospice. Provided education that the goal of SNF/rehab is improvement or at least stabilization of functional status, where the focus of hospice is comfort and quality of life  within the setting of end-stage illness. Discussed that he would not be eligible for hospice care while still receiving dialysis, as it is considered a life-prolonging intervention.   I attempted to elicit values and goals of care important to the patient. Dillon Perkins shares that [name] would very much want to be at home if at all possible.The difference between full scope medical intervention and comfort care was considered in light of the patient's goals of care, emphasizing quality versus quantity of life.  I presented the option to [place] of stopping dialysis and  getting him home with hospice. Discussed his prognosis would be limited, but that he may 1-2 weeks of time at home with family. As it would be short-term situation, discussed option of hiring private care giving to supplement the care provided by hospice. [name] requests time to consider this possibility - we discussed having this conversation together with [employer].   Questions and concerns were addressed. Dillon Perkins was provided with my contact info and encouraged to call with questions or concerns.    Primary decision maker: Patient, with support from his wife Dillon Perkins    SUMMARY OF RECOMMENDATIONS    DNR/DNI as previously documented  Continue current medical care  If patient does not improve or further declines, would recommend transition to comfort care in-house  In the event he becomes stable for discharge, Dillon Perkins expresses concern about caring for him at home if his prognosis is months  Discussed the option of stopping dialysis and getting him home with hospice - prognosis would be limited to less than 2 weeks - could utilize private care giving to supplement care provided by hospice  Plan for follow-up discussion tomorrow with both patient and wife  Code Status/Advance Care Planning:  DNR  Symptom Management:   Per primary team  Palliative Prophylaxis:   Oral Care and Turn Reposition  Additional Recommendations (Limitations, Scope, Preferences) :  MOST form completed 10/09/20 and is on file in [place]  Cardiopulmonary Resuscitation: Do Not Attempt Resuscitation (DNR/No CPR)  Medical Interventions: Limited Additional Interventions: Use medical treatment, IV fluids and cardiac monitoring as indicated, DO NOT USE intubation or mechanical ventilation. May consider use of less invasive airway support such as BiPAP or CPAP. Also provide comfort measures. Transfer to the hospital if indicated. Avoid intensive care.   Antibiotics: Determine use of limitation of  antibiotics when infection occurs  IV Fluids: IV fluids for a defined trial period  Feeding Tube: No feeding tube    Psycho-social/Spiritual:   Created space and opportunity for patient and family to express thoughts and feelings regarding patient's current medical situation.   Emotional support provided  Prognosis:   Very poor  Discharge Planning: To Be Determined      Primary Diagnoses: Present on Admission: . Acute GI bleeding . Atrial fibrillation with RVR ([place]) . Hepatocellular carcinoma ([place]) . Alcoholic cirrhosis of liver with ascites ([place])   I have reviewed the medical record, interviewed the patient and family, and examined the patient. The following aspects are pertinent.  Past Medical History:  Diagnosis Date  . Alcoholic cirrhosis ([place])   . Atrial fibrillation ([place])   . End-stage renal disease on hemodialysis ([place]) 10/08/2020  . GERD (gastroesophageal reflux disease)   . Hepatocellular carcinoma ([place])   . History of chemotherapy taking immunotherapy   last dose 05-13-2020  . History of kidney stones   . Hyperlipidemia   . Hypertension     Family History  Problem Relation Age of Onset  . Heart  disease Father 14       Heart attack  . Heart disease Sister 20       Heart attack  . Breast cancer Mother   . Colon cancer Neg Hx   . Stomach cancer Neg Hx   . Pancreatic cancer Neg Hx   . Rectal cancer Neg Hx   . Esophageal cancer Neg Hx    Scheduled Meds: . Chlorhexidine Gluconate Cloth  6 each Topical Daily  . Chlorhexidine Gluconate Cloth  6 each Topical Q0600  . darbepoetin (ARANESP) injection - DIALYSIS  60 mcg Intravenous Q Wed-HD  . feeding supplement  1 Container Oral TID BM  . lactulose  20 g Oral BID  . midodrine  20 mg Oral TID WC  . multivitamin  1 tablet Oral QHS  . [START ON 10/25/2020] pantoprazole  40 mg Intravenous Q12H  . rifaximin  550 mg Oral BID  . sodium chloride flush  10-40 mL Intracatheter Q12H  . sodium chloride flush  3 mL  Intravenous Q12H  . sucralfate  1 g Oral TID WC & HS   Continuous Infusions: . cefTRIAXone (ROCEPHIN)  IV Stopped (10/23/20 1755)  . pantoprozole (PROTONIX) infusion 8 mg/hr (10/24/20 1100)   PRN Meds:.acetaminophen, hydrocortisone cream, metoprolol tartrate, prochlorperazine, sodium chloride flush Medications Prior to Admission:  Prior to Admission medications   Medication Sig Start Date End Date Taking? Authorizing Provider  acetaminophen (TYLENOL) 500 MG tablet Take 500-1,000 mg by mouth See admin instructions. Take 500-1,000 mg by mouth at bedtime as needed for mild pain from itching and an additional 500 mg once a day as needed for discomfort or headaches- max daily dosage of 1,500 mg from all sources   Yes [provider]  diltiazem (CARDIZEM) 30 MG tablet Take 0.5 tablets (15 mg total) by mouth every 12 (twelve) hours. Patient taking differently: Take 30 mg by mouth 3 (three) times daily. 10/14/20 01/12/21 Yes Hall, Lorenda Cahill, DO  feeding supplement (BOOST HIGH PROTEIN) LIQD Take 1 Container by mouth See admin instructions. Drink 1 container by mouth up to three times a day for supplementation   Yes [provider]  feeding supplement (ENSURE ENLIVE / ENSURE PLUS) LIQD Take 237 mLs by mouth 4 (four) times daily. 10/14/20  Yes Irene Pap N, DO  lactulose (CHRONULAC) 10 GM/15ML solution Take 30 mLs (20 g total) by mouth 3 (three) times daily. 10/21/20  Yes Ladene Artist, MD  midodrine (PROAMATINE) 10 MG tablet Take 1 tablet (10 mg total) by mouth 3 (three) times daily. 10/14/20 01/12/21 Yes Kayleen Memos, DO  multivitamin (RENA-VIT) TABS tablet Take 1 tablet by mouth at bedtime. 10/14/20 01/12/21 Yes Hall, Lorenda Cahill, DO  rifaximin (XIFAXAN) 550 MG TABS tablet Take 1 tablet (550 mg total) by mouth 2 (two) times daily for 12 doses. Samples of this drug were given to the patient, quantity 12, Lot Number [id number] exp 04/26 10/21/20 10/27/20 Yes Ladene Artist, MD  camphor-menthol  [facility]) lotion Apply topically 2 (two) times daily. Patient not taking: No sig reported 10/14/20   Kayleen Memos, DO   Allergies  Allergen Reactions  . Losartan Other (See Comments) and Cough    Dizziness and possible cough   . Losartan Potassium Other (See Comments) and Cough    Dizziness and possible cough  . Other Other (See Comments)    External lotions for itching- INEFFECTIVE  . Rivaroxaban Other (See Comments)    Gross hematuria    Review of  Systems  Physical Exam Constitutional:      General: He is not in acute distress.    Appearance: He is ill-appearing.  Cardiovascular:     Rate and Rhythm: Tachycardia present.  Pulmonary:     Effort: Pulmonary effort is normal.  Neurological:     Mental Status: He is alert and oriented to person, place, and time.     Vital Signs: BP (!) 89/62   Pulse (!) 119   Temp (!) 97.3 F (36.3 C) (Axillary)   Resp 16   Ht '5\' 8"'  (1.727 m)   Wt 84 kg   SpO2 96%   BMI 28.16 kg/m  Pain Scale: 0-10   Pain Score: 0-No pain   SpO2: SpO2: 96 % O2 Device:SpO2: 96 % O2 Flow Rate: .O2 Flow Rate (L/min): 3 L/min  IO: Intake/output summary:   Intake/Output Summary (Last 24 hours) at 10/24/2020 1207 Last data filed at 10/24/2020 1100 Gross per 24 hour  Intake 719.36 ml  Output 50 ml  Net 669.36 ml    LBM: Last BM Date: 10/24/20 Baseline Weight: Weight: 83.6 kg Most recent weight: Weight: 84 kg      Palliative Assessment/Data: PPS 30-40%     Time In: 1800 Time Out: 1912 Time Total: 72 minutes Greater than 50%  of this time was spent counseling and coordinating care related to the above assessment and plan.  Signed by: Lavena Bullion, NP   Please contact Palliative Medicine Team phone at [phone] for questions and concerns.  For individual provider: See Shea Evans

## 2020-10-24 NOTE — Procedures (Signed)
Ultrasound-guided  therapeutic paracentesis performed yielding 3.3 liters of yellow  fluid. No immediate complications. EBL none.  ° °

## 2020-10-24 NOTE — Telephone Encounter (Signed)
Received fax from [place] stating that the information they received was incomplete. Attached was extra forms to be filled out. Answered questions and faxed forms back to [facility].

## 2020-10-24 NOTE — Progress Notes (Signed)
HOSPITAL MEDICINE OVERNIGHT EVENT NOTE    Notified by nursing hemoglobin has decreased from 7.4 to 6.8.  Nursing reports small amounts of maroon colored stool/blood overnight.  Hemodynamically stable.  Will order 1 unit PRBC transfusion to be given today.  Continue to monitor for evidence of bleeding.  Day provider to notify gastroenterology of ongoing anemia on day shift.   Vernelle Emerald  MD [facility]

## 2020-10-24 NOTE — Progress Notes (Addendum)
[facility] ROUNDING NOTE   Subjective: Hgb trended down to 6.8. Undergoing dialysis this am Patient says he is feeling better today   Objective: Vital signs in last 24 hours: Temp:  [97.3 F (36.3 C)-98 F (36.7 C)] 97.3 F (36.3 C) (04/21 1115) Pulse Rate:  [85-130] 119 (04/21 1426) Resp:  [10-25] 12 (04/21 1426) BP: (79-119)/(53-85) 116/73 (04/21 1426) SpO2:  [91 %-100 %] 99 % (04/21 1426) Weight:  [82.8 kg-84 kg] 82.8 kg (04/21 1426) Last BM Date: 10/24/20 General: NAD    Intake/Output from previous day: 04/20 0701 - 04/21 0700 In: 541.9 [P.O.:200; I.V.:241.9; IV Piggyback:100] Out: 100 [Urine:100] Intake/Output this shift: Total I/O In: 1083.6 [P.O.:690; I.V.:52.6; Blood:341] Out: 9244 [Urine:10; Other:1000; Stool:225]   Lab Results: Recent Labs    10/22/20 0047 10/22/20 0812 10/22/20 2357 10/24/20 0354 10/24/20 1423  WBC 7.3  --  8.5 8.7  --   HGB 8.3*   < > 7.4* 6.8* 7.9*  PLT 97*  --  100* 108*  --   MCV 102.5*  --  104.8* 106.8*  --    < > = values in this interval not displayed.   BMET Recent Labs    10/22/20 0047 10/22/20 2357 10/24/20 0354  NA 132* 132* 135  K 5.1 4.8 4.0  CL 96* 95* 99  CO2 23 23 26   GLUCOSE 126* 141* 154*  BUN 55* 76* 46*  CREATININE 5.15* 5.85* 4.32*  CALCIUM 9.0 8.8* 8.8*   LFT Recent Labs    10/21/20 1503 10/22/20 0047  PROT 5.5* 6.0*  ALBUMIN 2.7* 2.9*  AST 65* 71*  ALT 46* 55*  ALKPHOS 94 88  BILITOT 5.2* 7.2*   PT/INR Recent Labs    10/21/20 1503 10/22/20 0047  INR 2.0* 1.9*      Imaging/Other results: No results found.    Assessment &Plan  70 year old male with alcoholic cirrhosis decompensated by ascites, hepatic encephalopathy, recurrent hepatocellular carcinoma and end-stage renal disease, initiated on hemodialysis October 09, 2020  [place] 34  GI bleed: S/p EGD during this admission noted multiple duodenal ulcers.  Melena secondary to cratered duodenal ulcers, clean-based  with no visible vessel.  Did not require endoscopic intervention. Small esophageal varices  He is passing dark maroon stool, possible residual blood vs ischemic colitis (he was hypotensive intermittently  last 2 days) Thrombocytopenia secondary to portal hypertension and coagulopathy is also likely contributing to the ongoing bleed Continue to monitor hemoglobin and transfuse as needed if below 7 Hemoglobin responded appropriately to PRBC transfusion Do not recommend colonoscopy or further endoscopic intervention at this point  Hepatic encephalopathy: Stable Continue Xifaxan and lactulose  Ascites: Moderate, not tense.  Hold off large-volume paracentesis given hypotension and his volume status is being managed through dialysis  Hepatocellular carcinoma: 2 lesions in the right lobe of liver appear to be enlarged compared to prior concerning for recurrent hepatocellular carcinoma He is not a candidate for liver transplant, local or systemic therapy  A. fib with RVR NSTEMI with chest pain and elevated troponin  Overall poor prognosis, ongoing discussion regarding goals of care.  Await palliative care consult  GI is available on standby, please call with any questions.       Damaris Hippo , MD [phone]  [facility]

## 2020-10-24 NOTE — Telephone Encounter (Signed)
Received fax from [place] stating [place] has been approved from 07/06/2020-04/22/21.

## 2020-10-24 NOTE — Progress Notes (Signed)
PROGRESS NOTE    Dillon CASWELL  Perkins:[id number] DOB: 1951/04/18 DOA: 10/21/2020 PCP: Antony Contras, MD   Brief Narrative:  Dillon Perkins is a 70 y.o. 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 He has not seen any other obvious bleeding including epistaxis, hemoptysis, hematemesis, hematuria.  He has not seen any dark black color stool.  He denies any nausea or vomiting.  He feels as if his abdomen is beginning to retain fluid again.  He denies any chest pain or dyspnea.  He has not seen any swelling in his lower extremities.  He reports adherence to dialysis with last treatment on 4/16.  He receives dialysis through a dialysis catheter.  Assessment & Plan:   Principal Problem:   Acute GI bleeding Active Problems:   Atrial fibrillation with RVR (HCC)   Hepatocellular carcinoma (HCC)   ESRD on dialysis ([place])   Alcoholic cirrhosis of liver with ascites ([place])  Goals of care Palliative care/advanced care planning - Acute on chronic hypotension in the setting of ESRD and advanced alcoholic cirrhosis (see below) -Patient's blood pressure continues to be somewhat labile, worsening today with dialysis -discussed with nephrology, if this continues to be an issue he will likely not be able to tolerate further dialysis which dictates a somewhat poor prognosis -Palliative care has been consulted to further discuss goals of care with patient, he is DNR he does understand he has advanced liver and kidney disease -unclear if his current  goals of returning home to be independent are reasonable.  His morbidity mortality greatly increase with discontinuation of dialysis -likely weeks rather than months or years. -Continue to follow closely, will likely have further discussion with patient and his family as his disease evolves.  Regardless if patient were to improve somewhat over the next few days his prognosis is still poor in the long-term and would benefit from advanced planning.  Acute GI bleed with symptomatic anemia, ongoing Alcoholic cirrhosis Hepatocellular carcinoma s/p Y 90 and nivolumab Portal hypertension with esophageal varices: -Continues to have maroon stool overnight, hemoglobin dropped requiring additional unit transfusion - Hgb improved on recheck - GI following, appreciate assistance.   - Upper endoscopy remarkable for questionable long segment Barrett's, grade 2 varices with a nonbleeding duodenal ulcers GI recommending clear liquids to advance diet as tolerated - Continue PPI gtt and ceftriaxone x5 days - Additional unit transfused overnight, 2 units since admission total - MELD score is 35 -poor prognostic indicator - paracentesis later today - limit 3L - will likely need twice weekly para's moving forward - Clear liquid diet now, n.p.o. after midnight - Continue rifaximin 550 mg twice daily - Vitamin K 10 mg daily x3 days (completed0 - Ammonia elevated however patient AAO x4 hepatic encephalopathy resolved; lactulose ongoing  Persistent atrial fibrillation with RVR: - Chronically difficult to control A. fib due to sensitivity to medications.   - Rate largely maintaining between 100-120 on admission, would not be too aggressive due to risk for hypotension.   - Not on anticoagulation due to bleeding risk.   - PRN IV metoprolol 2.5 mg if needed for persistent HR >120,  hold for BP <90/60.  Atypical chest pain in the setting of hypotension, A. fib RVR  - Chest pain resolved with resolution of hypotension -  Troponin elevated to 4000, cardiology sidelined, evaluated November 2021, was not a candidate for cardiac cath at that time and he is arguably a much worse candidate now. - We will continue supportive care, attempt to maintain MAP greater than 65 which is likely his primary etiology for his chest pain given its onset with hypotension.  ESRD on TTS HD: - Nephrology following, appreciate insight and recommendations -Discussion today about hypotension during dialysis, poor prognostic indicator, already on max dose midodrine per pharmacy recommendations.   DVT prophylaxis: SCDs Code Status: DNR, confirmed with patient Family Communication: None present  Status is: Inpatient  Dispo: The patient is from: Home              Anticipated d/c is to: To be determined              Anticipated d/c date is: 48-72h pending clinical course              Patient currently not medically stable for discharge  Consultants:   Nephrology, PCCM, GI  Procedures:   EGD, dialysis  Antimicrobials:  Ceftriaxone x5 days  Subjective: Hemoglobin dropped overnight, 1 unit transfusion pending, denies nausea vomiting diarrhea constipation headache fevers or chills.  Feels generally quite well today in the setting of improved blood pressure.  Objective: Vitals:   10/24/20 0545 10/24/20 0600 10/24/20 0609 10/24/20 0630  BP:  107/74    Pulse: 98 91 93 95  Resp: 16 20 10 17   Temp:   97.7 F (36.5 C)   TempSrc:   Oral   SpO2: 98% 96% 100% 98%  Weight:      Height:        Intake/Output Summary (Last 24 hours) at 10/24/2020 0706 Last data filed at 10/24/2020 0600 Gross per 24 hour  Intake 531.87 ml  Output 100 ml  Net 431.87 ml   Filed Weights   10/23/20 1058 10/23/20 1420 10/24/20 0330  Weight: 83 kg 83 kg 83.7 kg    Examination:  General:  Pleasantly resting in bed, No acute distress. HEENT:  Normocephalic atraumatic.  Sclerae mildly icteric, noninjected.  Extraocular movements intact  bilaterally. Neck:  Without mass or deformity.  Trachea is midline. Lungs:  Clear to auscultate bilaterally without rhonchi, wheeze, or rales. Heart:  Regular rate and rhythm.  Without murmurs, rubs, or gallops. Abdomen:  Soft, nontender, moderately distended.  Without guarding or rebound. Extremities: Without cyanosis, clubbing, edema, or obvious deformity. Vascular:  Dorsalis pedis and posterior tibial pulses palpable bilaterally. Skin:  Warm and dry, no erythema, mild jaundice   Data Reviewed: I have personally reviewed following labs and imaging studies  CBC: Recent Labs  Lab 10/21/20 1503 10/21/20 1533 10/22/20 0047 10/22/20 0812 10/22/20 1632 10/22/20 2357 10/24/20 0354  WBC 10.8*  --  7.3  --   --  8.5 8.7  NEUTROABS 9.0*  --   --   --   --   --   --   HGB 7.7*   < > 8.3* 7.6* 7.6* 7.4* 6.8*  HCT 24.1*   < > 24.6* 22.2* 22.5* 21.9* 20.3*  MCV 112.6*  --  102.5*  --   --  104.8* 106.8*  PLT 126*  --  97*  --   --  100* 108*   < > = values in this interval not displayed.  Basic Metabolic Panel: Recent Labs  Lab 10/21/20 1503 10/21/20 1533 10/22/20 0047 10/22/20 1632 10/22/20 2357 10/24/20 0354  NA 130* 132* 132*  --  132* 135  K 4.5 4.6 5.1  --  4.8 4.0  CL 91* 94* 96*  --  95* 99  CO2 21*  --  23  --  23 26  GLUCOSE 205* 186* 126*  --  141* 154*  BUN 48* 45* 55*  --  76* 46*  CREATININE 5.14* 4.90* 5.15*  --  5.85* 4.32*  CALCIUM 8.6*  --  9.0  --  8.8* 8.8*  MG  --   --   --   --  2.4  --   PHOS  --   --   --  8.1* 7.5*  --    GFR: Estimated Creatinine Clearance: 16.5 mL/min (A) (by C-G formula based on SCr of 4.32 mg/dL (H)). Liver Function Tests: Recent Labs  Lab 10/21/20 1503 10/22/20 0047  AST 65* 71*  ALT 46* 55*  ALKPHOS 94 88  BILITOT 5.2* 7.2*  PROT 5.5* 6.0*  ALBUMIN 2.7* 2.9*   No results for input(s): LIPASE, AMYLASE in the last 168 hours. Recent Labs  Lab 10/21/20 1525  AMMONIA 83*   Coagulation Profile: Recent Labs  Lab  10/21/20 1503 10/22/20 0047  INR 2.0* 1.9*   Cardiac Enzymes: No results for input(s): CKTOTAL, CKMB, CKMBINDEX, TROPONINI in the last 168 hours. BNP (last 3 results) No results for input(s): PROBNP in the last 8760 hours. HbA1C: No results for input(s): HGBA1C in the last 72 hours. CBG: No results for input(s): GLUCAP in the last 168 hours. Lipid Profile: No results for input(s): CHOL, HDL, LDLCALC, TRIG, CHOLHDL, LDLDIRECT in the last 72 hours. Thyroid Function Tests: No results for input(s): TSH, T4TOTAL, FREET4, T3FREE, THYROIDAB in the last 72 hours. Anemia Panel: No results for input(s): VITAMINB12, FOLATE, FERRITIN, TIBC, IRON, RETICCTPCT in the last 72 hours. Sepsis Labs: No results for input(s): PROCALCITON, LATICACIDVEN in the last 168 hours.  Recent Results (from the past 240 hour(s))  Resp Panel by RT-PCR (Flu A&B, Covid) Nasopharyngeal Swab     Status: None   Collection Time: 10/21/20  4:51 PM   Specimen: Nasopharyngeal Swab; Nasopharyngeal(NP) swabs in vial transport medium  Result Value Ref Range Status   SARS Coronavirus 2 by RT PCR NEGATIVE NEGATIVE Final    Comment: (NOTE) SARS-CoV-2 target nucleic acids are NOT DETECTED.  The SARS-CoV-2 RNA is generally detectable in upper respiratory specimens during the acute phase of infection. The lowest concentration of SARS-CoV-2 viral copies this assay can detect is 138 copies/mL. A negative result does not preclude SARS-Cov-2 infection and should not be used as the sole basis for treatment or other patient management decisions. A negative result may occur with  improper specimen collection/handling, submission of specimen other than nasopharyngeal swab, presence of viral mutation(s) within the areas targeted by this assay, and inadequate number of viral copies(<138 copies/mL). A negative result must be combined with clinical observations, patient history, and epidemiological information. The expected result is  Negative.  Fact Sheet for Patients:  EntrepreneurPulse.com.au  Fact Sheet for Healthcare Providers:  IncredibleEmployment.be  This test is no t yet approved or cleared by the [place] FDA and  has been authorized for detection and/or diagnosis of SARS-CoV-2 by FDA under an Emergency Use Authorization (EUA). This EUA will remain  in effect (meaning this test can be used) for the duration of the COVID-19 declaration under Section  564(b)(1) of the Act, 21 U.S.C.section 360bbb-3(b)(1), unless the authorization is terminated  or revoked sooner.       Influenza A by PCR NEGATIVE NEGATIVE Final   Influenza B by PCR NEGATIVE NEGATIVE Final    Comment: (NOTE) The Xpert Xpress SARS-CoV-2/FLU/RSV plus assay is intended as an aid in the diagnosis of influenza from Nasopharyngeal swab specimens and should not be used as a sole basis for treatment. Nasal washings and aspirates are unacceptable for Xpert Xpress SARS-CoV-2/FLU/RSV testing.  Fact Sheet for Patients: EntrepreneurPulse.com.au  Fact Sheet for Healthcare Providers: IncredibleEmployment.be  This test is not yet approved or cleared by the [place] FDA and has been authorized for detection and/or diagnosis of SARS-CoV-2 by FDA under an Emergency Use Authorization (EUA). This EUA will remain in effect (meaning this test can be used) for the duration of the COVID-19 declaration under Section 564(b)(1) of the Act, 21 U.S.C. section 360bbb-3(b)(1), unless the authorization is terminated or revoked.  Performed at [facility], [place] [address] [place], [place] [address]      Radiology Studies: No results found. Scheduled Meds: . Chlorhexidine Gluconate Cloth  6 each Topical Daily  . Chlorhexidine Gluconate Cloth  6 each Topical Q0600  . darbepoetin (ARANESP) injection - DIALYSIS  60 mcg Intravenous Q Wed-HD  . feeding supplement  1 Container  Oral TID BM  . lactulose  20 g Oral BID  . midodrine  20 mg Oral TID WC  . multivitamin  1 tablet Oral QHS  . [START ON 10/25/2020] pantoprazole  40 mg Intravenous Q12H  . rifaximin  550 mg Oral BID  . sodium chloride flush  10-40 mL Intracatheter Q12H  . sodium chloride flush  3 mL Intravenous Q12H  . sucralfate  1 g Oral TID WC & HS   Continuous Infusions: . cefTRIAXone (ROCEPHIN)  IV Stopped (10/23/20 1755)  . pantoprozole (PROTONIX) infusion 8 mg/hr (10/24/20 0600)     LOS: 3 days   Time spent: 45min  Anthonie Lotito C Larell Baney, DO [facility]  If 7PM-7AM, please contact night-coverage [email or web address]  10/24/2020, 7:06 AM

## 2020-10-25 LAB — BPAM RBC
Blood Product Expiration Date: 202204252359
Blood Product Expiration Date: 202205072359
ISSUE DATE / TIME: 202204181839
ISSUE DATE / TIME: 202204210543
Unit Type and Rh: 6200
Unit Type and Rh: 6200

## 2020-10-25 LAB — TYPE AND SCREEN
ABO/RH(D): A POS
Antibody Screen: NEGATIVE
Unit division: 0
Unit division: 0

## 2020-10-25 LAB — BASIC METABOLIC PANEL
Anion gap: 9 (ref 5–15)
BUN: 27 mg/dL — ABNORMAL HIGH (ref 8–23)
CO2: 27 mmol/L (ref 22–32)
Calcium: 8.8 mg/dL — ABNORMAL LOW (ref 8.9–10.3)
Chloride: 98 mmol/L (ref 98–111)
Creatinine, Ser: 3.21 mg/dL — ABNORMAL HIGH (ref 0.61–1.24)
GFR, Estimated: 20 mL/min — ABNORMAL LOW (ref >60)
Glucose, Bld: 109 mg/dL — ABNORMAL HIGH (ref 70–99)
Potassium: 3.8 mmol/L (ref 3.5–5.1)
Sodium: 134 mmol/L — ABNORMAL LOW (ref 135–145)

## 2020-10-25 LAB — CBC
HCT: 23.8 % — ABNORMAL LOW (ref 39.0–52.0)
Hemoglobin: 8.1 g/dL — ABNORMAL LOW (ref 13.0–17.0)
MCH: 35.8 pg — ABNORMAL HIGH (ref 26.0–34.0)
MCHC: 34 g/dL (ref 30.0–36.0)
MCV: 105.3 fL — ABNORMAL HIGH (ref 80.0–100.0)
Platelets: 103 10*3/uL — ABNORMAL LOW (ref 150–400)
RBC: 2.26 MIL/uL — ABNORMAL LOW (ref 4.22–5.81)
RDW: 21.4 % — ABNORMAL HIGH (ref 11.5–15.5)
WBC: 8.2 10*3/uL (ref 4.0–10.5)
nRBC: 1.9 % — ABNORMAL HIGH (ref 0.0–0.2)

## 2020-10-25 MED ORDER — CHLORHEXIDINE GLUCONATE CLOTH 2 % EX PADS
6.0000 | MEDICATED_PAD | Freq: Every day | CUTANEOUS | Status: DC
  Administered 2020-10-25 – 2020-10-28 (×4): 6 via TOPICAL

## 2020-10-25 NOTE — Progress Notes (Signed)
PROGRESS NOTE    Dillon Perkins  WEX:[id number] DOB: 21-Jun-1951 DOA: 10/21/2020 PCP: Antony Contras, MD   Brief Narrative:  Dillon Perkins is a 70 y.o. 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 He has not seen any other obvious bleeding including epistaxis, hemoptysis, hematemesis, hematuria.  He has not seen any dark black color stool.  He denies any nausea or vomiting.  He feels as if his abdomen is beginning to retain fluid again.  He denies any chest pain or dyspnea.  He has not seen any swelling in his lower extremities.  He reports adherence to dialysis with last treatment on 4/16.  He receives dialysis through a dialysis catheter.  Assessment & Plan:   Principal Problem:   GI bleed Active Problems:   Atrial fibrillation with RVR (HCC)   Hepatocellular carcinoma (HCC)   ESRD on dialysis (HCC)   Alcoholic cirrhosis of liver with ascites (HCC)   Ascites   Acute GI bleeding  Goals of care Palliative care/advanced care planning - Acute on chronic hypotension in the setting of ESRD and advanced alcoholic cirrhosis (see below) -Palliative care to have formal discussion with patient and family later today -Given patient currently requires dialysis 3 times a week, will likely need paracentesis twice per week he is looking at spending 5 days a week out of house and some sort of facility which we discussed was not consistent with our previous goals of care and improvement in quality of life as his renal and hepatic function  continue to wane and his disease is progressed. -Patient indicates today that his main goal is to get back home which I told him would be certainly reasonable with palliative or hospice involved.  If he wishes to go home and be fully independent or even a reasonably safe discharge with minimal assistance he would likely need to go to rehab and skilled care for some time which may be weeks given his baseline -Patient remains DNR, this decision was made previously in the setting of his advanced disease -If patient were to discharge home with palliative or hospice would likely discontinue dialysis, I would offer an abdominal drain for his recurrent ascites so that he could be drained at home without needing to be transported, this would also improve his symptom management and is in line with increased quality of life.  We discussed that this would carry an increased risk of infection but thus far the family appears to be agreeable that the risk of infection is outweighed at least acutely by the benefits of quality of life  Acute GI bleed with symptomatic anemia, ongoing Alcoholic cirrhosis Hepatocellular carcinoma s/p Y 90 and nivolumab Portal hypertension with esophageal varices: -Hemoglobin somewhat stable overnight, no further episodes of bleeding -Depending on discussion as above with palliative care patient may not wish for further invasive care or testing, upper endoscopy previously negative for acute bleeding -Patient to complete PPI gtt and ceftriaxone today -No recurrent bleeding thus no need for new transfusion, 2 units since admission total - MELD score is 35 -poor prognostic indicator - paracentesis yesterday with 3.3 L withdrawn, -abdominal symptoms  markedly improved post fluid withdrawal - At this point will advance diet back to renal diet now that we are leaning away from further intervention or procedure.  If patient does wish to have abdominal drain placed will follow with IR, unclear if  he needs to be n.p.o. for this procedure - Continue rifaximin 550 mg twice daily - Vitamin K x3 days (completed0 -Continue lactulose, goal 3 bowel movements per day  Persistent atrial fibrillation with RVR: - Chronically difficult to control A. fib due to sensitivity to medications.   - Rate largely maintaining between 100-120 on admission, would not be too aggressive due to risk for hypotension.   - Not on anticoagulation due to bleeding risk.   - PRN IV metoprolol 2.5 mg if needed for persistent HR >120, hold for BP <90/60.  Atypical chest pain in the setting of hypotension, A. fib RVR, resolved - Chest pain resolved with resolution of hypotension - Troponin elevated to 4000, cardiology sidelined, evaluated November 2021, was not a candidate for cardiac cath at that time and he is arguably a much worse candidate now. - We will continue supportive care, attempt to maintain MAP greater than 65 which is likely his primary etiology for his chest pain given its onset with hypotension.  ESRD on TTS HD: - Nephrology following, appreciate insight and recommendations -Discussion today about hypotension during dialysis, poor prognostic indicator, already on max dose midodrine per pharmacy recommendations.  DVT prophylaxis: SCDs Code Status: DNR, confirmed with patient Family Communication: Wife updated over the phone at length  Status is: Inpatient  Dispo: The patient is from: Home              Anticipated d/c is to: To be determined              Anticipated d/c date is: 48-72h pending clinical course              Patient currently not medically stable for discharge given ongoing need for possible intervention, IV medications and close monitoring.  Consultants:   Nephrology, PCCM, GI  Procedures:   EGD, dialysis  Antimicrobials:  Ceftriaxone x5 days, completed  Subjective: No acute issues or events overnight denies nausea vomiting diarrhea constipation headache fevers or chills.   Hemoglobin stabilized, patient states he feels "quite well" and is requesting discharge home which we discussed would need further planning and discussion for goals of care  Objective: Vitals:   10/25/20 0600 10/25/20 0630 10/25/20 0700 10/25/20 0728  BP: 94/67  112/61   Pulse: (!) 106 (!) 116 (!) 141   Resp: 13 20 (!) 22   Temp:    97.8 F (36.6 C)  TempSrc:    Oral  SpO2: 97% 98% (!) 85%   Weight:      Height:        Intake/Output Summary (Last 24 hours) at 10/25/2020 0734 Last data filed at 10/24/2020 1900 Gross per 24 hour  Intake 1373.6 ml  Output 1295 ml  Net 78.6 ml   Filed Weights   10/24/20 1042 10/24/20 1426 10/25/20 0400  Weight: 84 kg 82.8 kg 79.9 kg    Examination:  General:  Pleasantly resting in bed, No acute distress. HEENT:  Normocephalic atraumatic.  Sclerae mildly icteric, noninjected.  Extraocular movements intact bilaterally. Neck:  Without mass or deformity.  Trachea is midline. Lungs:  Clear to auscultate bilaterally without rhonchi, wheeze, or rales. Heart:  Regular rate and rhythm.  Without murmurs, rubs, or gallops. Abdomen:  Soft, nontender,  moderately distended.  Without guarding or rebound. Extremities: Without cyanosis, clubbing, edema, or obvious deformity. Vascular:  Dorsalis pedis and posterior tibial pulses palpable bilaterally. Skin:  Warm and dry, no erythema, mild jaundice   Data Reviewed: I have personally reviewed following labs and imaging studies  CBC: Recent Labs  Lab 10/21/20 1503 10/21/20 1533 10/22/20 0047 10/22/20 0812 10/22/20 1632 10/22/20 2357 10/24/20 0354 10/24/20 1423 10/25/20 0152  WBC 10.8*  --  7.3  --   --  8.5 8.7  --  8.2  NEUTROABS 9.0*  --   --   --   --   --   --   --   --   HGB 7.7*   < > 8.3*   < > 7.6* 7.4* 6.8* 7.9* 8.1*  HCT 24.1*   < > 24.6*   < > 22.5* 21.9* 20.3* 23.4* 23.8*  MCV 112.6*  --  102.5*  --   --  104.8* 106.8*  --  105.3*  PLT 126*  --  97*  --   --  100* 108*  --  103*   < > =  values in this interval not displayed.   Basic Metabolic Panel: Recent Labs  Lab 10/21/20 1503 10/21/20 1533 10/22/20 0047 10/22/20 1632 10/22/20 2357 10/24/20 0354 10/25/20 0152  NA 130* 132* 132*  --  132* 135 134*  K 4.5 4.6 5.1  --  4.8 4.0 3.8  CL 91* 94* 96*  --  95* 99 98  CO2 21*  --  23  --  23 26 27   GLUCOSE 205* 186* 126*  --  141* 154* 109*  BUN 48* 45* 55*  --  76* 46* 27*  CREATININE 5.14* 4.90* 5.15*  --  5.85* 4.32* 3.21*  CALCIUM 8.6*  --  9.0  --  8.8* 8.8* 8.8*  MG  --   --   --   --  2.4  --   --   PHOS  --   --   --  8.1* 7.5*  --   --    GFR: Estimated Creatinine Clearance: 20.4 mL/min (A) (by C-G formula based on SCr of 3.21 mg/dL (H)). Liver Function Tests: Recent Labs  Lab 10/21/20 1503 10/22/20 0047  AST 65* 71*  ALT 46* 55*  ALKPHOS 94 88  BILITOT 5.2* 7.2*  PROT 5.5* 6.0*  ALBUMIN 2.7* 2.9*   No results for input(s): LIPASE, AMYLASE in the last 168 hours. Recent Labs  Lab 10/21/20 1525  AMMONIA 83*   Coagulation Profile: Recent Labs  Lab 10/21/20 1503 10/22/20 0047  INR 2.0* 1.9*   Cardiac Enzymes: No results for input(s): CKTOTAL, CKMB, CKMBINDEX, TROPONINI in the last 168 hours. BNP (last 3 results) No results for input(s): PROBNP in the last 8760 hours. HbA1C: No results for input(s): HGBA1C in the last 72 hours. CBG: Recent Labs  Lab 10/24/20 2032  GLUCAP 92   Lipid Profile: No results for input(s): CHOL, HDL, LDLCALC, TRIG, CHOLHDL, LDLDIRECT in the last 72 hours. Thyroid Function Tests: No results for input(s): TSH, T4TOTAL, FREET4, T3FREE, THYROIDAB in the last 72 hours. Anemia Panel: No results for input(s): VITAMINB12, FOLATE, FERRITIN, TIBC, IRON, RETICCTPCT in the last 72 hours. Sepsis Labs: No results for input(s): PROCALCITON, LATICACIDVEN in the last 168 hours.  Recent Results (from the past 240 hour(s))  Resp Panel by RT-PCR (Flu A&B, Covid) Nasopharyngeal Swab     Status: None   Collection Time:  10/21/20  4:51 PM  Specimen: Nasopharyngeal Swab; Nasopharyngeal(NP) swabs in vial transport medium  Result Value Ref Range Status   SARS Coronavirus 2 by RT PCR NEGATIVE NEGATIVE Final    Comment: (NOTE) SARS-CoV-2 target nucleic acids are NOT DETECTED.  The SARS-CoV-2 RNA is generally detectable in upper respiratory specimens during the acute phase of infection. The lowest concentration of SARS-CoV-2 viral copies this assay can detect is 138 copies/mL. A negative result does not preclude SARS-Cov-2 infection and should not be used as the sole basis for treatment or other patient management decisions. A negative result may occur with  improper specimen collection/handling, submission of specimen other than nasopharyngeal swab, presence of viral mutation(s) within the areas targeted by this assay, and inadequate number of viral copies(<138 copies/mL). A negative result must be combined with clinical observations, patient history, and epidemiological information. The expected result is Negative.  Fact Sheet for Patients:  EntrepreneurPulse.com.au  Fact Sheet for Healthcare Providers:  IncredibleEmployment.be  This test is no t yet approved or cleared by the [place] FDA and  has been authorized for detection and/or diagnosis of SARS-CoV-2 by FDA under an Emergency Use Authorization (EUA). This EUA will remain  in effect (meaning this test can be used) for the duration of the COVID-19 declaration under Section 564(b)(1) of the Act, 21 U.S.C.section 360bbb-3(b)(1), unless the authorization is terminated  or revoked sooner.       Influenza A by PCR NEGATIVE NEGATIVE Final   Influenza B by PCR NEGATIVE NEGATIVE Final    Comment: (NOTE) The Xpert Xpress SARS-CoV-2/FLU/RSV plus assay is intended as an aid in the diagnosis of influenza from Nasopharyngeal swab specimens and should not be used as a sole basis for treatment. Nasal washings  and aspirates are unacceptable for Xpert Xpress SARS-CoV-2/FLU/RSV testing.  Fact Sheet for Patients: EntrepreneurPulse.com.au  Fact Sheet for Healthcare Providers: IncredibleEmployment.be  This test is not yet approved or cleared by the [place] FDA and has been authorized for detection and/or diagnosis of SARS-CoV-2 by FDA under an Emergency Use Authorization (EUA). This EUA will remain in effect (meaning this test can be used) for the duration of the COVID-19 declaration under Section 564(b)(1) of the Act, 21 U.S.C. section 360bbb-3(b)(1), unless the authorization is terminated or revoked.  Performed at [facility], [place] [address], [place], [place] [address]      Radiology Studies: IR Paracentesis  Result Date: 10/24/2020 INDICATION: Patient with history of hepatocellular carcinoma, end-stage renal disease, alcoholic cirrhosis, recurrent ascites. Request received for therapeutic paracentesis. EXAM: ULTRASOUND GUIDED THERAPEUTIC PARACENTESIS MEDICATIONS: 1% lidocaine to skin and subcutaneous tissue COMPLICATIONS: None immediate. PROCEDURE: Informed written consent was obtained from the patient after a discussion of the risks, benefits and alternatives to treatment. A timeout was performed prior to the initiation of the procedure. Initial ultrasound scanning demonstrates a large amount of ascites within the right upper to mid abdominal quadrant. The right upper to mid abdomen was prepped and draped in the usual sterile fashion. 1% lidocaine was used for local anesthesia. Following this, a 19 gauge, 7-cm, Yueh catheter was introduced. An ultrasound image was saved for documentation purposes. The paracentesis was performed. The catheter was removed and a dressing was applied. The patient tolerated the procedure well without immediate post procedural complication. FINDINGS: A total of approximately 3.3 liters of yellow fluid was removed.  IMPRESSION: Successful ultrasound-guided therapeutic paracentesis yielding 3.3 liters of peritoneal fluid. Read by: Rowe Robert, PA-C Electronically Signed   By: Markus Daft M.D.   On:  10/24/2020 16:37   Scheduled Meds: . chlorhexidine  15 mL Mouth Rinse BID  . Chlorhexidine Gluconate Cloth  6 each Topical Q0600  . darbepoetin (ARANESP) injection - DIALYSIS  60 mcg Intravenous Q Wed-HD  . feeding supplement  1 Container Oral TID BM  . lactulose  20 g Oral BID  . mouth rinse  15 mL Mouth Rinse q12n4p  . midodrine  20 mg Oral TID WC  . multivitamin  1 tablet Oral QHS  . pantoprazole  40 mg Intravenous Q12H  . rifaximin  550 mg Oral BID  . sodium chloride flush  10-40 mL Intracatheter Q12H  . sodium chloride flush  3 mL Intravenous Q12H  . sucralfate  1 g Oral TID WC & HS   Continuous Infusions: . cefTRIAXone (ROCEPHIN)  IV 1 g (10/24/20 1746)     LOS: 4 days   Time spent: 45min  Jencarlo Bonadonna C Kaydyn Sayas, DO [facility]  If 7PM-7AM, please contact night-coverage [email or web address]  10/25/2020, 7:34 AM

## 2020-10-25 NOTE — Progress Notes (Incomplete)
Daily Progress Note   Patient Name: Dillon Perkins       Date: 10/25/2020 DOB: September 01, 1950  Age: 70 y.o. MRN#: [id number] Attending Physician: Little Ishikawa, MD Primary Care Physician: Antony Contras, MD Admit Date: 10/21/2020  Reason for Consultation/Follow-up: Establishing goals of care "ESRD, approaching end stage liver"  Subjective: 14:00--I met with patient today in his room. He is OOB to the recliner. He shares that he is hopeful to "get stronger" and "get better". We discussed that these are not realistic goals in the setting of end-stage liver disease and ESRD.   [name] shares that his goal is to go home, as soon as possible. He wants to sit on his back deck and watch the birds, as well as have time to spend with his family. The difference between full scope medical intervention and comfort care was considered in light of the patient's goals of care, emphasizing quality versus quantity of life. I presented the option to [place] of stopping dialysis and getting him home with hospice. Discussed his prognosis would be limited, but that he may have 1-2 weeks of quality time at home with his family. I provided reassurance that hospice services would ensure his comfort and dignity at home during this time.   [name] verbalizes understanding that his time is very limited. He also states he "is not ready to give up". He is hopeful to have at least 30 more days on this earth. He is not ready yet to stop dialysis - would like to continue outpatient dialysis for another 2 weeks and "see how it goes". At this point, [name] has returned to the room. We discuss a potential plan of discharging home with home health and continuing dialysis (outpatient) for another few weeks, then transitioning to hospice  care. This would require him to be transported to the dialysis center 3 times per week. We also discussed the issue of recurrent ascites. [name] and [name] have the understanding that he will be able to have an abdominal drain placed so that he will not require paracentesis twice weekly. I provided education that this type of drain is usually only placed when patients transition to comfort care, due to the high risk of infection.   15:40--I spoke with Dr. Avon Gully by phone and updated him on my discussion with patient and  wife. Larena Sox that the abdominal drain will not be offered unless he is discharging home with hospice care, due to the high risk of infection.  20:15--I spoke with [name] by phone and let her know the drain will not be an option unless he is going into hospice care. She expresses appreciation for the update - since our discussion earlier today she has been concerned about being able to get him to dialysis from home. He has yet to work with PT and [name] shares that he became dizzy while standing only briefly.   I let her know I will be by tomorrow and we can discuss further in-person.   Length of Stay: 4  Current Medications: Scheduled Meds:  . chlorhexidine  15 mL Mouth Rinse BID  . Chlorhexidine Gluconate Cloth  6 each Topical Q0600  . darbepoetin (ARANESP) injection - DIALYSIS  60 mcg Intravenous Q Wed-HD  . feeding supplement  1 Container Oral TID BM  . lactulose  20 g Oral BID  . mouth rinse  15 mL Mouth Rinse q12n4p  . midodrine  20 mg Oral TID WC  . multivitamin  1 tablet Oral QHS  . pantoprazole  40 mg Intravenous Q12H  . rifaximin  550 mg Oral BID  . sodium chloride flush  10-40 mL Intracatheter Q12H  . sodium chloride flush  3 mL Intravenous Q12H  . sucralfate  1 g Oral TID WC & HS      PRN Meds: acetaminophen, hydrocortisone cream, metoprolol tartrate, prochlorperazine, sodium chloride flush  Physical Exam Constitutional:      General: He is not  in acute distress.    Appearance: He is ill-appearing.  Cardiovascular:     Rate and Rhythm: Tachycardia present.  Pulmonary:     Effort: Pulmonary effort is normal.  Skin:    Coloration: Skin is jaundiced.  Neurological:     Mental Status: He is alert and oriented to person, place, and time.     Motor: Weakness present.             Vital Signs: BP 112/76   Pulse (!) 110   Temp 98 F (36.7 C) (Oral)   Resp 20   Ht '5\' 8"'  (1.727 m)   Wt 79.9 kg   SpO2 98%   BMI 26.78 kg/m  SpO2: SpO2: 98 % O2 Device: O2 Device: Room Air O2 Flow Rate: O2 Flow Rate (L/min): 3 L/min  Intake/output summary:   Intake/Output Summary (Last 24 hours) at 10/25/2020 2033 Last data filed at 10/25/2020 1600 Gross per 24 hour  Intake 240 ml  Output 200 ml  Net 40 ml   LBM: Last BM Date: 10/25/20 Baseline Weight: Weight: 83.6 kg Most recent weight: Weight: 79.9 kg       Palliative Assessment/Data: PPS 30-40%       Palliative Care Assessment & Plan   HPI/Patient Profile: 70 y.o. 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 Recently hospitalized 4/6-4/11 with acute encephalopathy and elevated ammonia level.  He presented to the emergency department on 10/21/2020 with dizziness and blood in his stool. In the ED, he was started on protonix infusion, octreotide, and vitamin K. He was admitted to [facility] with acute GI bleed with symptomatic anemia.   EGD 4/19 showed non-obstructing non-bleeding duodenal ulcers as  the source of GI bleeding. Post procedure patient developed hypotension requiring low dose NEO and transfer to ICU.   MELD score on admission of 35.   Assessment: - acute GI bleed with symptomatic anemia -  alcoholic cirrhosis - hepatocellular carcinoma s/p Y90 and immunotherapy - portal hypertension with esophageal varices - persistent a fib with RVR - ERSD on HD - Generalized weakness  Recommendations/Plan:  DNR/DNI as previously documented  Continue current medical care  Patient's goal is to go home  Patient knows his prognosis is limited, but is struggling with decision to stop dialysis  Unfortunately an abdominal drain will not be op  I will follow-up with patient and wife tomorrow  Goals of Care and Additional Recommendations:  Limitations on Scope of Treatment: Full scope medical treatment  Code Status:  DNR/DNI  Prognosis:  Very poor  Discharge Planning:  To Be Determined  Care plan was discussed with Dr. Avon Gully and bedside RN  Thank you for allowing the Palliative Medicine Team to assist in the care of this patient.   Total Time 70 minutes Prolonged Time Billed  yes       Greater than 50%  of this time was spent counseling and coordinating care related to the above assessment and plan.  Lavena Bullion, NP  Please contact Palliative Medicine Team phone at [phone] for questions and concerns.

## 2020-10-25 NOTE — Progress Notes (Signed)
Daily Progress Note   Patient Name: Dillon Perkins       Date: 10/25/2020 DOB: 20-Jan-1951  Age: 70 y.o. MRN#: [id number] Attending Physician: Little Ishikawa, MD Primary Care Physician: Antony Contras, MD Admit Date: 10/21/2020  Reason for Consultation/Follow-up: Establishing goals of care "ESRD, approaching end stage liver"  Subjective: 14:00--I met with patient today in his room. He is OOB to the recliner. He shares that he is hopeful to "get stronger" and "get better". We discussed that these are not realistic goals in the setting of end-stage liver disease and ESRD.   [name] shares that his goal is to go home, as soon as possible. He wants to sit on his back deck and watch the birds, as well as have time to spend with his family. The difference between full scope medical intervention and comfort care was considered in light of the patient's goals of care, emphasizing quality versus quantity of life. I presented the option to [name] of stopping dialysis and getting him home with hospice. Discussed his prognosis would be limited, but that he may have 1-2 weeks of quality time at home with his family. I provided reassurance that hospice services would ensure his comfort and dignity at home during this time.   [name] verbalizes understanding that his time is very limited. He also states he "is not ready to give up". He is hopeful to have at least 30 more days on this earth. He is not ready yet to stop dialysis - would like to continue outpatient dialysis for another 2 weeks and "see how it goes". At this point, [name] has returned to the room. We discuss a potential plan of discharging home with home health and continuing dialysis (outpatient) for another few weeks, then transitioning to hospice  care. This would require him to be transported to the dialysis center 3 times per week. We also discussed the issue of recurrent ascites. [name] and [name] have the understanding that he will be able to have an abdominal drain placed so that he will not require paracentesis twice weekly. Discussed that this type of drain is usually only placed when patients transition to comfort care, due to the high risk of infection.   15:40--I spoke with Dr. Avon Gully by phone and updated him on my discussion with patient and wife. Clarified  that the abdominal drain will not be offered unless he is discharging home with hospice care, due to the high risk of infection.  20:15--I spoke with [name] by phone and let her know the drain will not be an option unless he is going into hospice care. She expresses appreciation for the update - since our discussion earlier today she has been concerned about being able to get him to dialysis from home. He has yet to work with PT and [name] shares that he became dizzy while standing only briefly.   I let her know I will be by tomorrow and we can discuss further in-person.   Length of Stay: 4  Current Medications: Scheduled Meds:  . chlorhexidine  15 mL Mouth Rinse BID  . Chlorhexidine Gluconate Cloth  6 each Topical Q0600  . darbepoetin (ARANESP) injection - DIALYSIS  60 mcg Intravenous Q Wed-HD  . feeding supplement  1 Container Oral TID BM  . lactulose  20 g Oral BID  . mouth rinse  15 mL Mouth Rinse q12n4p  . midodrine  20 mg Oral TID WC  . multivitamin  1 tablet Oral QHS  . pantoprazole  40 mg Intravenous Q12H  . rifaximin  550 mg Oral BID  . sodium chloride flush  10-40 mL Intracatheter Q12H  . sodium chloride flush  3 mL Intravenous Q12H  . sucralfate  1 g Oral TID WC & HS      PRN Meds: acetaminophen, hydrocortisone cream, metoprolol tartrate, prochlorperazine, sodium chloride flush  Physical Exam Constitutional:      General: He is not in acute  distress.    Appearance: He is ill-appearing.  Cardiovascular:     Rate and Rhythm: Tachycardia present.  Pulmonary:     Effort: Pulmonary effort is normal.  Skin:    Coloration: Skin is jaundiced.  Neurological:     Mental Status: He is alert and oriented to person, place, and time.     Motor: Weakness present.             Vital Signs: BP 112/76   Pulse (!) 110   Temp 98 F (36.7 C) (Oral)   Resp 20   Ht '5\' 8"'  (1.727 m)   Wt 79.9 kg   SpO2 98%   BMI 26.78 kg/m  SpO2: SpO2: 98 % O2 Device: O2 Device: Room Air O2 Flow Rate: O2 Flow Rate (L/min): 3 L/min  Intake/output summary:   Intake/Output Summary (Last 24 hours) at 10/25/2020 2033 Last data filed at 10/25/2020 1600 Gross per 24 hour  Intake 240 ml  Output 200 ml  Net 40 ml   LBM: Last BM Date: 10/25/20 Baseline Weight: Weight: 83.6 kg Most recent weight: Weight: 79.9 kg       Palliative Assessment/Data: PPS 30-40%       Palliative Care Assessment & Plan   HPI/Patient Profile: 70 y.o. 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 Recently hospitalized 4/6-4/11 with acute encephalopathy and elevated ammonia level.  He presented to the emergency department on 10/21/2020 with dizziness and blood in his stool. In the ED, he was started on protonix infusion, octreotide, and vitamin K. He was admitted to [facility] with acute GI bleed with symptomatic anemia.   EGD 4/19 showed non-obstructing non-bleeding duodenal ulcers as the source  of GI bleeding. Post procedure patient developed hypotension requiring low dose NEO and transfer to ICU.   MELD score on admission of 35.   Assessment: - acute GI bleed with symptomatic anemia - alcoholic  cirrhosis - hepatocellular carcinoma s/p Y90 and immunotherapy - portal hypertension with esophageal varices - persistent a fib with RVR - ERSD on HD - Generalized weakness  Recommendations/Plan:  DNR/DNI as previously documented  Continue current medical care  Patient's goal is to go home  Patient knows his time is limited, but is struggling with decision to stop dialysis  Unfortunately an abdominal drain will not be option unless discharging home with hospice  I will follow-up with patient and wife tomorrow  Goals of Care and Additional Recommendations:  Limitations on Scope of Treatment: Full scope medical treatment  Code Status:  DNR/DNI  Prognosis:  Very poor  Discharge Planning:  To Be Determined  Care plan was discussed with Dr. Avon Gully and bedside RN  Thank you for allowing the Palliative Medicine Team to assist in the care of this patient.   Total Time 70 minutes Prolonged Time Billed  yes       Greater than 50%  of this time was spent counseling and coordinating care related to the above assessment and plan.  Lavena Bullion, NP  Please contact Palliative Medicine Team phone at [phone] for questions and concerns.

## 2020-10-25 NOTE — Progress Notes (Signed)
[facility] Progress Note  Subjective: seen in ICU, no new c/o's. BP's up a bit, feels stronger.   Vitals:   10/25/20 0630 10/25/20 0700 10/25/20 0728 10/25/20 0833  BP:  112/61    Pulse: (!) 116 (!) 141  95  Resp: 20 (!) 22  14  Temp:   97.8 F (36.6 C)   TempSrc:   Oral   SpO2: 98% (!) 85%  95%  Weight:      Height:        Exam:  alert, nad , chron ill appearing  no jvd  Chest cta bilat  Cor reg no RG  Abd soft ntnd 2-3+  ascites   Ext no LE edema   Alert, NF, ox3    Home meds:  - lactulose 34ml tid/ rifaximin 550 bid  - cardizem 30 tid  - ensure enlive qid/ boost high prot tid  - prn's/ vitamins/ supplements    OP HD: [facility] TTS   3.5h  300/600  77kg  Hep none   TDC  - no esa     CXR 4/18 - IMPRESSION: No evidence of acute cardiopulmonary disease.   Assessment/ Plan: 1. GI bleed - acute, sp 1u prbc's 4/18. Hb 7.4 today. EGD 4/19 + for duodenal ulcers likely source of bleeding. Also mild portal HTN gastropathy, grade II esophageal varices and early Barrett's esophagus changes.  2. 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 Taking lactulose and rifaximin.  3. Ascites - recurrent, now is getting weekly paracentesis as OP. Inpatient LVP per pmd.  4. Afib / RVR - per primary team, HR 90- 120, not on any rate lowering meds , prob due to low BP's.  5. ESRD - recent start to dialysis in early April. Poor candidate for CRRT since will not change the long-term outcome. Cont HD TTS. HD Saturday. Should be able to dialyze upstairs.   6. Hypotension/ vol- most likely due to cirrhosis physiology +/- anemia.  BP's a bit better on max-dose midodrine 20 tid. 2kg up by wts, no sig edema on exam.  7. Anemia ckd+ abl - started darbe 60 ug weekly here (rec'd 4/20).  8. MBD ckd - Ca stable. Phos on recent admit was low in 3.3- 5.0 range. Repeat phos. No binders for now.  9. GOC - pt is DNR. Palliative care consultation appreciated. Overall prognosis is very poor.     Rob Demaris Bousquet 10/25/2020, 12:50 PM   Recent Labs  Lab 10/22/20 1632 10/22/20 2357 10/24/20 0354 10/24/20 1423 10/25/20 0152  K  --  4.8 4.0  --  3.8  BUN  --  76* 46*  --  27*  CREATININE  --  5.85* 4.32*  --  3.21*  CALCIUM  --  8.8* 8.8*  --  8.8*  PHOS 8.1* 7.5*  --   --   --   HGB 7.6* 7.4* 6.8* 7.9* 8.1*   Inpatient medications: . chlorhexidine  15 mL Mouth Rinse BID  . Chlorhexidine Gluconate Cloth  6 each Topical Q0600  . darbepoetin (ARANESP) injection - DIALYSIS  60 mcg Intravenous Q Wed-HD  . feeding supplement  1 Container Oral TID BM  . lactulose  20 g Oral BID  . mouth rinse  15 mL Mouth Rinse q12n4p  . midodrine  20 mg Oral TID WC  . multivitamin  1 tablet Oral QHS  . pantoprazole  40 mg Intravenous Q12H  . rifaximin  550 mg Oral BID  . sodium chloride  flush  10-40 mL Intracatheter Q12H  . sodium chloride flush  3 mL Intravenous Q12H  . sucralfate  1 g Oral TID WC & HS   . cefTRIAXone (ROCEPHIN)  IV 1 g (10/24/20 1746)   acetaminophen, hydrocortisone cream, metoprolol tartrate, prochlorperazine, sodium chloride flush

## 2020-10-26 LAB — CBC
HCT: 23.3 % — ABNORMAL LOW (ref 39.0–52.0)
Hemoglobin: 7.8 g/dL — ABNORMAL LOW (ref 13.0–17.0)
MCH: 35.5 pg — ABNORMAL HIGH (ref 26.0–34.0)
MCHC: 33.5 g/dL (ref 30.0–36.0)
MCV: 105.9 fL — ABNORMAL HIGH (ref 80.0–100.0)
Platelets: 112 10*3/uL — ABNORMAL LOW (ref 150–400)
RBC: 2.2 MIL/uL — ABNORMAL LOW (ref 4.22–5.81)
RDW: 21.7 % — ABNORMAL HIGH (ref 11.5–15.5)
WBC: 7.3 10*3/uL (ref 4.0–10.5)
nRBC: 0.7 % — ABNORMAL HIGH (ref 0.0–0.2)

## 2020-10-26 LAB — BASIC METABOLIC PANEL
Anion gap: 11 (ref 5–15)
BUN: 41 mg/dL — ABNORMAL HIGH (ref 8–23)
CO2: 24 mmol/L (ref 22–32)
Calcium: 8.8 mg/dL — ABNORMAL LOW (ref 8.9–10.3)
Chloride: 98 mmol/L (ref 98–111)
Creatinine, Ser: 4.37 mg/dL — ABNORMAL HIGH (ref 0.61–1.24)
GFR, Estimated: 14 mL/min — ABNORMAL LOW (ref >60)
Glucose, Bld: 116 mg/dL — ABNORMAL HIGH (ref 70–99)
Potassium: 3.8 mmol/L (ref 3.5–5.1)
Sodium: 133 mmol/L — ABNORMAL LOW (ref 135–145)

## 2020-10-26 NOTE — Evaluation (Signed)
Physical Therapy Evaluation Patient Details Name: Dillon Perkins MRN: [id number] DOB: 1950/12/24 Today's Date: 10/26/2020   History of Present Illness  Dillon Perkins is a 70 y.o. 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    Clinical Impression  Pt admitted with above. Pt and wife talking with palliative. Per notes pt with prognosis of less than 30 days to live. Pt desires to continues HD as he doesn't want to give up and really wants to sit on the back deck with his wife and enjoy the birds. In effort of trying to get patient home we discussed using a w/c to get to/from HD via exiting the back of the house versus trying to negotiate 5 steps. Pt continues with low BP and erratic HR with poor activity tolerance and increased falls risk. I do not recommend pt navigating stairs even with the assist of his wife, especially after HD. Pt will need a w/c upon d/c to all for safe mobility in home and to/from medical appts and HD. Acute PT to cont to follow.    Follow Up Recommendations Home health PT;Supervision/Assistance - 24 hour    Equipment Recommendations  Wheelchair (measurements PT);Wheelchair cushion (measurements PT)    Recommendations for Other Services       Precautions / Restrictions Precautions Precautions: Fall;Other (comment) Precaution Comments: watch HR and BP Restrictions Weight Bearing Restrictions: No      Mobility  Bed Mobility               General bed mobility comments: pt up in chair upon arrival    Transfers Overall transfer level: Needs  assistance Equipment used: None Transfers: Sit to/from Stand Sit to Stand: Supervision         General transfer comment: verbal cues to push up from arm rests  Ambulation/Gait Ambulation/Gait assistance: Min guard Gait Distance (Feet): 10 Feet (x1, 50x1, 60x1) Assistive device: Rolling walker (2 wheeled) Gait Pattern/deviations: Step-through pattern;Decreased stride length;Trunk flexed Gait velocity: de Gait velocity interpretation: <1.31 ft/sec, indicative of household ambulator General Gait Details: pt with onset of "fatigue spell"/dizziness requiring pt to sit each time with exception of second bout in which pt needed to sit due to fatigue and SOB. Pt's BP captured on third amb bout to be 68/43, starting at 94/68. HR also irratic from 138-168bpm.  Stairs            Wheelchair Mobility    Modified Rankin (Stroke Patients Only)       Balance Overall balance assessment: Needs assistance   Sitting balance-Leahy Scale: Good       Standing balance-Leahy Scale: Poor Standing balance comment: need RW at this time                             Pertinent Vitals/Pain Pain Assessment: No/denies pain    Home Living Family/patient expects to be discharged to:: Private residence Living Arrangements: Spouse/significant other Available Help at Discharge: Family;Available 24 hours/day (wife took FMLA) Type of Home: House Home Access: Stairs to enter Entrance Stairs-Rails: Left Entrance Stairs-Number of Steps: 5 Home Layout: One level Home Equipment: Walker - 2 wheels;Bedside commode  Additional Comments: wife also reports it is a level entry in the back of the house but there is a grassy hill    Prior Function Level of Independence: Needs assistance   Gait / Transfers Assistance Needed: pt was amb with RW however collapsed and fell, now has dizzy spells that causes him to collapse  ADL's / Homemaking Assistance Needed: was completing with set up         Hand Dominance   Dominant Hand: Right    Extremity/Trunk Assessment   Upper Extremity Assessment Upper Extremity Assessment: Generalized weakness    Lower Extremity Assessment Lower Extremity Assessment: Generalized weakness    Cervical / Trunk Assessment Cervical / Trunk Assessment: Other exceptions (does have chronic ascitis)  Communication   Communication: No difficulties  Cognition Arousal/Alertness: Awake/alert Behavior During Therapy: WFL for tasks assessed/performed Overall Cognitive Status: Within Functional Limits for tasks assessed                                 General Comments: pt does have some word finding dfificulty, delayed processing, pt aware      General Comments General comments (skin integrity, edema, etc.): HR irratic from 130-160s, BP soft dropping to 60s/40s during amb    Exercises     Assessment/Plan    PT Assessment Patient needs continued PT services  PT Problem List Decreased strength;Decreased activity tolerance;Decreased balance;Decreased mobility;Decreased knowledge of use of DME;Decreased safety awareness       PT Treatment Interventions DME instruction;Gait training;Stair training;Functional mobility training;Therapeutic activities;Therapeutic exercise;Balance training;Neuromuscular re-education;Patient/family education    PT Goals (Current goals can be found in the Care Plan section)  Acute Rehab PT Goals Patient Stated Goal: Get back home PT Goal Formulation: With patient/family Time For Goal Achievement: 11/09/20 Potential to Achieve Goals: Fair    Frequency Min 3X/week   Barriers to discharge        Co-evaluation               AM-PAC PT "6 Clicks" Mobility  Outcome Measure Help needed turning from your back to your side while in a flat bed without using bedrails?: None Help needed moving from lying on your back to sitting on the side of a flat bed without using bedrails?: None Help needed  moving to and from a bed to a chair (including a wheelchair)?: None Help needed standing up from a chair using your arms (e.g., wheelchair or bedside chair)?: None Help needed to walk in hospital room?: A Little Help needed climbing 3-5 steps with a railing? : A Little 6 Click Score: 22    End of Session Equipment Utilized During Treatment: Gait belt Activity Tolerance: Patient tolerated treatment well Patient left: in chair;with call bell/phone within reach;with family/visitor present Nurse Communication: Mobility status PT Visit Diagnosis: Other abnormalities of gait and mobility (R26.89);Muscle weakness (generalized) (M62.81)    Time: 6468-0321 PT Time Calculation (min) (ACUTE ONLY): 34 min   Charges:   PT Evaluation $PT Eval Moderate Complexity: 1 Mod PT Treatments $Gait Training: 8-22 mins        Kittie Plater, PT, DPT Acute Rehabilitation Services Pager #: [phone] Office #: [phone]   Berline Lopes 10/26/2020, 11:29 AM

## 2020-10-26 NOTE — Progress Notes (Signed)
Patient transported to 5W on telemetry accompanied by RN. All belongings with patient including cell phone. Pt was stable and placed on 5W telemetry with receiving nurse.  Wife is at bedside with all of her belongings.

## 2020-10-26 NOTE — Progress Notes (Signed)
Daily Progress Note   Patient Name: Dillon Perkins       Date: 10/26/2020 DOB: 14-Aug-1950  Age: 70 y.o. MRN#: [id number] Attending Physician: Little Ishikawa, MD Primary Care Physician: Antony Contras, MD Admit Date: 10/21/2020  Reason for Consultation/Follow-up: Establishing goals of care "ESRD, approaching end stage liver"  Subjective: Per PT note this morning, patient was able to ambulate with rolling walker on 3 different bouts (26ft, 80ft, and 31ft) but did have to sit each time due to fatigue. Also noted low BP 68/32 on third bout and tachycardia 138-168 bpm.   Patient was able to tolerate a full HD session today.   18:00--After patient returned from HD, I met in the room with Dr. Avon Gully, patient, and wife. Discussed that since he has tolerated HD and seems to be stable, he may be able to discharge home as soon as tomorrow.  Dr. Avon Gully clarified that the abdominal drain is an option only for when he is ready to stop dialysis and transition to hospice care. In the meantime, he will do outpatient dialysis 3 days per week (TTS schedule) and outpatient paracentesis twice weekly. Discussed that this will be difficult, but will give him additional time per his wishes.  Discussed that at some point, as his condition progresses, he will no longer be able to tolerate dialysis. At this time, it would be appropriate to transition to hospice care.  I offered and explained the option for an outpatient palliative referral to check-in  with patient and family and continue goals of care discussions. Discussed that outpatient palliative can also assist with transition to hospice care when his condition declines.     Length of Stay: 5  Current Medications: Scheduled Meds:  . chlorhexidine   15 mL Mouth Rinse BID  . Chlorhexidine Gluconate Cloth  6 each Topical Q0600  . darbepoetin (ARANESP) injection - DIALYSIS  60 mcg Intravenous Q Wed-HD  . feeding supplement  1 Container Oral TID BM  . lactulose  20 g Oral BID  . mouth rinse  15 mL Mouth Rinse q12n4p  . midodrine  20 mg Oral TID WC  . multivitamin  1 tablet Oral QHS  . pantoprazole  40 mg Intravenous Q12H  . rifaximin  550 mg Oral BID  . sodium chloride flush  10-40 mL Intracatheter  Q12H  . sodium chloride flush  3 mL Intravenous Q12H  . sucralfate  1 g Oral TID WC & HS       PRN Meds: acetaminophen, hydrocortisone cream, metoprolol tartrate, prochlorperazine, sodium chloride flush  Physical Exam Vitals reviewed.  Constitutional:      General: He is not in acute distress.    Appearance: He is ill-appearing.  Cardiovascular:     Rate and Rhythm: Tachycardia present.  Pulmonary:     Effort: Pulmonary effort is normal.  Skin:    Coloration: Skin is jaundiced.  Neurological:     Mental Status: He is alert and oriented to person, place, and time.     Motor: Weakness present.             Vital Signs: BP 94/63 (BP Location: Right Arm)   Pulse (!) 116   Temp 97.7 F (36.5 C) (Oral)   Resp 12   Ht $R'5\' 8"'fh$  (1.727 m)   Wt 80.1 kg   SpO2 99%   BMI 26.85 kg/m  SpO2: SpO2: 99 % O2 Device: O2 Device: Room Air O2 Flow Rate: O2 Flow Rate (L/min): 3 L/min  Intake/output summary:   Intake/Output Summary (Last 24 hours) at 10/26/2020 1810 Last data filed at 10/26/2020 1700 Gross per 24 hour  Intake 613 ml  Output 0 ml  Net 613 ml   LBM: Last BM Date: 10/25/20 Baseline Weight: Weight: 83.6 kg Most recent weight: Weight: 80.1 kg       Palliative Assessment/Data: PPS 30-40%        Palliative Care Assessment & Plan    HPI/Patient Profile: 70 y.o. 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 Recently hospitalized 4/6-4/11 with acute encephalopathy and elevated ammonia level.  He presented to the emergency department on 10/21/2020 with dizziness and blood in his stool. In the ED, he was started on protonix infusion, octreotide, and vitamin K. He was admitted to [facility] with acute GI bleed with symptomatic anemia.    EGD 4/19 showed non-obstructing non-bleeding duodenal ulcers as the source of GI bleeding. Post procedure patient developed hypotension requiring low dose NEO and transfer to ICU.    MELD score on admission of 35.    Assessment: - acute GI bleed with symptomatic anemia - alcoholic cirrhosis - hepatocellular carcinoma s/p Y90 and immunotherapy - portal hypertension with esophageal varices - persistent a fib with RVR - ERSD on HD - Generalized weakness  Recommendations/Plan:  DNR/DNI as previously documented  Continue current medical care  Plan for discharge home with outpatient dialysis 3 times per week and paracentesis twice weekly  Referral to outpatient palliative  PMT will continue to follow  Goals of Care and Additional Recommendations:  Limitations on Scope of Treatment: Full Scope Treatment  Code Status: DNR/DNI  Prognosis:  Very poor, likely weeks to months  Discharge Planning:  Home with Home Health and outpatient palliative   Thank you for allowing the Palliative Medicine Team to assist in the care of this patient.   Total Time 35 minutes Prolonged Time Billed  no       Greater than 50%  of this time was spent counseling and coordinating care related to the above assessment and plan.  Lavena Bullion, NP  Please contact Palliative Medicine Team phone  at [phone] for questions and concerns.

## 2020-10-26 NOTE — Progress Notes (Signed)
PROGRESS NOTE    Dillon Perkins  GUY:[id number] DOB: 08-Jul-1950 DOA: 10/21/2020 PCP: Antony Contras, MD   Brief Narrative:  Dillon Perkins is a 70 y.o. 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 He has not seen any other obvious bleeding including epistaxis, hemoptysis, hematemesis, hematuria.  He has not seen any dark black color stool.  He denies any nausea or vomiting.  He feels as if his abdomen is beginning to retain fluid again.  He denies any chest pain or dyspnea.  He has not seen any swelling in his lower extremities.  He reports adherence to dialysis with last treatment on 4/16.  He receives dialysis through a dialysis catheter.  Assessment & Plan:   Principal Problem:   GI bleed Active Problems:   Atrial fibrillation with RVR (HCC)   Hepatocellular carcinoma (HCC)   ESRD on dialysis ([place])   Alcoholic cirrhosis of liver with ascites (HCC)   Ascites   Acute GI bleeding   Goals of care Palliative care/advanced care planning - Acute on chronic hypotension in the setting of ESRD and advanced alcoholic cirrhosis (see below) -Palliative care and myself had multiple discussions with patient and his wife. -Given patient currently requires dialysis 3 times a week, will likely need paracentesis twice per week he is looking at spending 5 days a week out of house and some sort of facility which we discussed was not consistent with our previous goals of care and improvement in quality of life as his renal and hepatic  function continue to wane and his disease is progressed. -Patient continues to iterate that his goal is to come home which we discussed would be quite difficult given his need for transport 5 days a week. -Patient remains DNR, this decision was made previously in the setting of his advanced disease -At this time patient and wife do not appear to be moving forward with hospice discussion, as such I do not believe it is in the patient's best interest to place a abdominal port/catheter for fluid drainage in the setting of complication and infection risk.  This could be done if patient were hospice for comfort measures only but this is not a risk-free endeavor and as such it would only be placed at end-of-life care to improve comfort and to ensure patient does not require unnecessary transport or intervention.  Acute GI bleed with symptomatic anemia, stabilizing Alcoholic cirrhosis Hepatocellular carcinoma s/p Y 90 and nivolumab Portal hypertension with esophageal varices: -Hemoglobin minimally downtrending without overt signs of bleeding -Previously completed ceftriaxone PPI drip -No recurrent bleeding thus no need for new transfusion, 2 units since admission total - MELD score is 35 -poor prognostic indicator - Planned for twice weekly paracentesis in the near future - At this point will advance diet back to renal diet now that we are leaning away from further intervention or procedure.  If patient does wish to have abdominal drain placed will follow with IR, unclear if he needs to be n.p.o. for this procedure - Continue rifaximin 550 mg twice daily - Vitamin K x3 days (completed0 - Continue lactulose, goal 3 bowel movements per  day  Persistent atrial fibrillation with RVR: - Chronically difficult to control A. fib due to sensitivity to medications.   - Rate largely maintaining between 100-120 on admission, would not be too aggressive due to risk for hypotension.   - Not on anticoagulation due  to bleeding risk.   - PRN IV metoprolol 2.5 mg if needed for persistent HR >120, hold for BP <90/60.  Atypical chest pain in the setting of profound hypotension, A. fib RVR, resolved - Chest pain resolved with resolution of hypotension - Troponin elevated to 4000, cardiology sidelined, evaluated November 2021, was not a candidate for cardiac cath at that time and he is arguably a much worse candidate now. - We will continue supportive care, attempt to maintain MAP greater than 65 which is likely his primary etiology for his chest pain given its onset with hypotension.  ESRD on TTS HD: - Nephrology following, appreciate insight and recommendations -Discussion today about hypotension during dialysis, poor prognostic indicator, already on max dose midodrine per pharmacy recommendations -Patient's tolerance of dialysis will likely dictate his ongoing care moving forward.  As he does not wish to quit dialysis for hospice.  He does continue to have profound hypotension and our concern is that if he cannot tolerate dialysis he will continue to decline quite rapidly.  DVT prophylaxis: SCDs Code Status: DNR, confirmed with patient and wife Family Communication: Wife updated at bedside  Status is: Inpatient  Dispo: The patient is from: Home              Anticipated d/c is to: To be determined              Anticipated d/c date is: 48-72h pending clinical course              Patient currently not medically stable for discharge given ongoing need for possible intervention, IV medications and close monitoring.  Consultants:   Nephrology, PCCM, GI  Procedures:   EGD, dialysis  Antimicrobials:  Ceftriaxone x5 days, completed  Subjective: No acute issues or events overnight; denies nausea vomiting diarrhea constipation headache fevers or chills.  No further episodes of bleeding reported.  Otherwise feels quite well, ambulating without overt difficulty and continues to request discharge home once  his medical complications have resolved.  Objective: Vitals:   10/26/20 0340 10/26/20 0400 10/26/20 0500 10/26/20 0600  BP:  120/82 101/86 106/65  Pulse:  (!) 134 (!) 121 98  Resp:  19 (!) 29 13  Temp: (!) 97.5 F (36.4 C)     TempSrc: Oral     SpO2:  99% 99% 99%  Weight:      Height:        Intake/Output Summary (Last 24 hours) at 10/26/2020 0727 Last data filed at 10/25/2020 1600 Gross per 24 hour  Intake 240 ml  Output 200 ml  Net 40 ml   Filed Weights   10/24/20 1042 10/24/20 1426 10/25/20 0400  Weight: 84 kg 82.8 kg 79.9 kg    Examination:  General:  Pleasantly resting in bed, No acute distress. HEENT:  Normocephalic atraumatic.  Sclerae mildly icteric, noninjected.  Extraocular movements intact bilaterally. Neck:  Without mass or deformity.  Trachea is midline. Lungs:  Clear to auscultate bilaterally without rhonchi, wheeze, or rales. Heart:  Regular rate and rhythm.  Without murmurs, rubs, or gallops. Abdomen:  Soft, nontender, moderately distended.  Without guarding or rebound.  Rectal tube putting out dark brown stool Extremities: Without cyanosis, clubbing, edema, or obvious deformity.  Vascular:  Dorsalis pedis and posterior tibial pulses palpable bilaterally. Skin:  Warm and dry, no erythema, mild jaundice   Data Reviewed: I have personally reviewed following labs and imaging studies  CBC: Recent Labs  Lab 10/21/20 1503 10/21/20 1533 10/22/20 0047 10/22/20 0812 10/22/20 2357 10/24/20 0354 10/24/20 1423 10/25/20 0152 10/26/20 0103  WBC 10.8*  --  7.3  --  8.5 8.7  --  8.2 7.3  NEUTROABS 9.0*  --   --   --   --   --   --   --   --   HGB 7.7*   < > 8.3*   < > 7.4* 6.8* 7.9* 8.1* 7.8*  HCT 24.1*   < > 24.6*   < > 21.9* 20.3* 23.4* 23.8* 23.3*  MCV 112.6*  --  102.5*  --  104.8* 106.8*  --  105.3* 105.9*  PLT 126*  --  97*  --  100* 108*  --  103* 112*   < > = values in this interval not displayed.   Basic Metabolic Panel: Recent Labs  Lab  10/22/20 0047 10/22/20 1632 10/22/20 2357 10/24/20 0354 10/25/20 0152 10/26/20 0103  NA 132*  --  132* 135 134* 133*  K 5.1  --  4.8 4.0 3.8 3.8  CL 96*  --  95* 99 98 98  CO2 23  --  23 26 27 24   GLUCOSE 126*  --  141* 154* 109* 116*  BUN 55*  --  76* 46* 27* 41*  CREATININE 5.15*  --  5.85* 4.32* 3.21* 4.37*  CALCIUM 9.0  --  8.8* 8.8* 8.8* 8.8*  MG  --   --  2.4  --   --   --   PHOS  --  8.1* 7.5*  --   --   --    GFR: Estimated Creatinine Clearance: 15 mL/min (A) (by C-G formula based on SCr of 4.37 mg/dL (H)). Liver Function Tests: Recent Labs  Lab 10/21/20 1503 10/22/20 0047  AST 65* 71*  ALT 46* 55*  ALKPHOS 94 88  BILITOT 5.2* 7.2*  PROT 5.5* 6.0*  ALBUMIN 2.7* 2.9*   No results for input(s): LIPASE, AMYLASE in the last 168 hours. Recent Labs  Lab 10/21/20 1525  AMMONIA 83*   Coagulation Profile: Recent Labs  Lab 10/21/20 1503 10/22/20 0047  INR 2.0* 1.9*   Cardiac Enzymes: No results for input(s): CKTOTAL, CKMB, CKMBINDEX, TROPONINI in the last 168 hours. BNP (last 3 results) No results for input(s): PROBNP in the last 8760 hours. HbA1C: No results for input(s): HGBA1C in the last 72 hours. CBG: Recent Labs  Lab 10/24/20 2032  GLUCAP 92   Lipid Profile: No results for input(s): CHOL, HDL, LDLCALC, TRIG, CHOLHDL, LDLDIRECT in the last 72 hours. Thyroid Function Tests: No results for input(s): TSH, T4TOTAL, FREET4, T3FREE, THYROIDAB in the last 72 hours. Anemia Panel: No results for input(s): VITAMINB12, FOLATE, FERRITIN, TIBC, IRON, RETICCTPCT in the last 72 hours. Sepsis Labs: No results for input(s): PROCALCITON, LATICACIDVEN in the last 168 hours.  Recent Results (from the past 240 hour(s))  Resp Panel by RT-PCR (Flu A&B, Covid) Nasopharyngeal Swab     Status: None   Collection Time: 10/21/20  4:51 PM   Specimen: Nasopharyngeal Swab; Nasopharyngeal(NP) swabs in vial transport medium  Result Value Ref Range Status   SARS Coronavirus 2  by RT PCR NEGATIVE NEGATIVE Final    Comment: (NOTE) SARS-CoV-2 target nucleic acids are NOT DETECTED.  The  SARS-CoV-2 RNA is generally detectable in upper respiratory specimens during the acute phase of infection. The lowest concentration of SARS-CoV-2 viral copies this assay can detect is 138 copies/mL. A negative result does not preclude SARS-Cov-2 infection and should not be used as the sole basis for treatment or other patient management decisions. A negative result may occur with  improper specimen collection/handling, submission of specimen other than nasopharyngeal swab, presence of viral mutation(s) within the areas targeted by this assay, and inadequate number of viral copies(<138 copies/mL). A negative result must be combined with clinical observations, patient history, and epidemiological information. The expected result is Negative.  Fact Sheet for Patients:  EntrepreneurPulse.com.au  Fact Sheet for Healthcare Providers:  IncredibleEmployment.be  This test is no t yet approved or cleared by the [place] FDA and  has been authorized for detection and/or diagnosis of SARS-CoV-2 by FDA under an Emergency Use Authorization (EUA). This EUA will remain  in effect (meaning this test can be used) for the duration of the COVID-19 declaration under Section 564(b)(1) of the Act, 21 U.S.C.section 360bbb-3(b)(1), unless the authorization is terminated  or revoked sooner.       Influenza A by PCR NEGATIVE NEGATIVE Final   Influenza B by PCR NEGATIVE NEGATIVE Final    Comment: (NOTE) The Xpert Xpress SARS-CoV-2/FLU/RSV plus assay is intended as an aid in the diagnosis of influenza from Nasopharyngeal swab specimens and should not be used as a sole basis for treatment. Nasal washings and aspirates are unacceptable for Xpert Xpress SARS-CoV-2/FLU/RSV testing.  Fact Sheet for Patients: EntrepreneurPulse.com.au  Fact  Sheet for Healthcare Providers: IncredibleEmployment.be  This test is not yet approved or cleared by the [place] FDA and has been authorized for detection and/or diagnosis of SARS-CoV-2 by FDA under an Emergency Use Authorization (EUA). This EUA will remain in effect (meaning this test can be used) for the duration of the COVID-19 declaration under Section 564(b)(1) of the Act, 21 U.S.C. section 360bbb-3(b)(1), unless the authorization is terminated or revoked.  Performed at [facility], [facility] [address] [place], [place] [address]      Radiology Studies: IR Paracentesis  Result Date: 10/24/2020 INDICATION: Patient with history of hepatocellular carcinoma, end-stage renal disease, alcoholic cirrhosis, recurrent ascites. Request received for therapeutic paracentesis. EXAM: ULTRASOUND GUIDED THERAPEUTIC PARACENTESIS MEDICATIONS: 1% lidocaine to skin and subcutaneous tissue COMPLICATIONS: None immediate. PROCEDURE: Informed written consent was obtained from the patient after a discussion of the risks, benefits and alternatives to treatment. A timeout was performed prior to the initiation of the procedure. Initial ultrasound scanning demonstrates a large amount of ascites within the right upper to mid abdominal quadrant. The right upper to mid abdomen was prepped and draped in the usual sterile fashion. 1% lidocaine was used for local anesthesia. Following this, a 19 gauge, 7-cm, Yueh catheter was introduced. An ultrasound image was saved for documentation purposes. The paracentesis was performed. The catheter was removed and a dressing was applied. The patient tolerated the procedure well without immediate post procedural complication. FINDINGS: A total of approximately 3.3 liters of yellow fluid was removed. IMPRESSION: Successful ultrasound-guided therapeutic paracentesis yielding 3.3 liters of peritoneal fluid. Read by: Rowe Robert, PA-C Electronically Signed   By:  Markus Daft M.D.   On: 10/24/2020 16:37   Scheduled Meds: . chlorhexidine  15 mL Mouth Rinse BID  . Chlorhexidine Gluconate Cloth  6 each Topical Q0600  . darbepoetin (ARANESP) injection - DIALYSIS  60 mcg Intravenous Q Wed-HD  . feeding  supplement  1 Container Oral TID BM  . lactulose  20 g Oral BID  . mouth rinse  15 mL Mouth Rinse q12n4p  . midodrine  20 mg Oral TID WC  . multivitamin  1 tablet Oral QHS  . pantoprazole  40 mg Intravenous Q12H  . rifaximin  550 mg Oral BID  . sodium chloride flush  10-40 mL Intracatheter Q12H  . sodium chloride flush  3 mL Intravenous Q12H  . sucralfate  1 g Oral TID WC & HS   Continuous Infusions:    LOS: 5 days   Time spent: 103min  Aerith Canal C Shamariah Shewmake, DO [facility]  If 7PM-7AM, please contact night-coverage [email or web address]  10/26/2020, 7:27 AM

## 2020-10-26 NOTE — Progress Notes (Signed)
[facility] Progress Note  Subjective: seen in ICU, no new c/o's. BP's soft, sitting up in a chair. BP 70's.   Vitals:   10/26/20 1300 10/26/20 1330 10/26/20 1400 10/26/20 1430  BP: (!) 73/45 (!) 76/50 (!) 89/66 (!) 82/52  Pulse: (!) 112 (!) 106 (!) 111 (!) 104  Resp: 16 16  12   Temp:      TempSrc:      SpO2:      Weight:      Height:        Exam:  alert, nad , chron ill appearing  no jvd  Chest cta bilat  Cor reg no RG  Abd soft ntnd 2-3+  ascites   Ext no LE edema   Alert, NF, ox3    Home meds:  - lactulose 98ml tid/ rifaximin 550 bid  - cardizem 30 tid  - ensure enlive qid/ boost high prot tid  - prn's/ vitamins/ supplements    OP HD: [facility] TTS   3.5h  300/600  77kg  Hep none   TDC  - no esa     CXR 4/18 - IMPRESSION: No evidence of acute cardiopulmonary disease.   Assessment/ Plan: 1. GI bleed - acute, sp 1u prbc's 4/18. Hb 7.4 today. EGD 4/19 + for duodenal ulcers likely source of bleeding. Also mild portal HTN gastropathy, grade II esophageal varices and early Barrett's esophagus changes.  2. 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 Taking lactulose and rifaximin.  3. Ascites - recurrent, now is getting weekly paracentesis as OP. Inpatient LVP per pmd.  4. Afib / RVR - per primary team, HR 90- 120, not on any rate lowering meds , prob due to low BP's.  5. ESRD - recent start to dialysis in early April. Poor candidate for CRRT. Cont HD TTS. HD today. Will need BP's > 70- 75 in order to dialyze safely. If can't maintain BP's on HD will need hospice.   6. Hypotension/ vol- most likely due to cirrhosis physiology +/- anemia.  BP's a bit better on max-dose midodrine 20 tid. 2kg up by wts, no LE edema on exam, always has ascites.  7. Anemia ckd+ abl - started darbe 60 ug weekly here (rec'd 4/20).  8. MBD ckd - Ca stable. Phos on recent admit was low in 3.3- 5.0 range. Repeat phos. No binders for now.  9. GOC - pt is DNR. Palliative care  consultation appreciated. Overall prognosis is very poor.    Rob Roshonda Sperl 10/26/2020, 3:01 PM   Recent Labs  Lab 10/22/20 1632 10/22/20 2357 10/24/20 0354 10/25/20 0152 10/26/20 0103  K  --  4.8   < > 3.8 3.8  BUN  --  76*   < > 27* 41*  CREATININE  --  5.85*   < > 3.21* 4.37*  CALCIUM  --  8.8*   < > 8.8* 8.8*  PHOS 8.1* 7.5*  --   --   --   HGB 7.6* 7.4*   < > 8.1* 7.8*   < > = values in this interval not displayed.   Inpatient medications: . chlorhexidine  15 mL Mouth Rinse BID  . Chlorhexidine Gluconate Cloth  6 each Topical Q0600  . darbepoetin (ARANESP) injection - DIALYSIS  60 mcg Intravenous Q Wed-HD  . feeding supplement  1 Container Oral TID BM  . lactulose  20 g Oral BID  . mouth rinse  15 mL Mouth Rinse q12n4p  . midodrine  20  mg Oral TID WC  . multivitamin  1 tablet Oral QHS  . pantoprazole  40 mg Intravenous Q12H  . rifaximin  550 mg Oral BID  . sodium chloride flush  10-40 mL Intracatheter Q12H  . sodium chloride flush  3 mL Intravenous Q12H  . sucralfate  1 g Oral TID WC & HS    acetaminophen, hydrocortisone cream, metoprolol tartrate, prochlorperazine, sodium chloride flush

## 2020-10-27 LAB — BASIC METABOLIC PANEL
Anion gap: 11 (ref 5–15)
BUN: 24 mg/dL — ABNORMAL HIGH (ref 8–23)
CO2: 24 mmol/L (ref 22–32)
Calcium: 8.5 mg/dL — ABNORMAL LOW (ref 8.9–10.3)
Chloride: 97 mmol/L — ABNORMAL LOW (ref 98–111)
Creatinine, Ser: 3.18 mg/dL — ABNORMAL HIGH (ref 0.61–1.24)
GFR, Estimated: 20 mL/min — ABNORMAL LOW (ref >60)
Glucose, Bld: 93 mg/dL (ref 70–99)
Potassium: 3.2 mmol/L — ABNORMAL LOW (ref 3.5–5.1)
Sodium: 132 mmol/L — ABNORMAL LOW (ref 135–145)

## 2020-10-27 LAB — CBC
HCT: 23.5 % — ABNORMAL LOW (ref 39.0–52.0)
Hemoglobin: 7.8 g/dL — ABNORMAL LOW (ref 13.0–17.0)
MCH: 35.5 pg — ABNORMAL HIGH (ref 26.0–34.0)
MCHC: 33.2 g/dL (ref 30.0–36.0)
MCV: 106.8 fL — ABNORMAL HIGH (ref 80.0–100.0)
Platelets: 91 10*3/uL — ABNORMAL LOW (ref 150–400)
RBC: 2.2 MIL/uL — ABNORMAL LOW (ref 4.22–5.81)
RDW: 22.5 % — ABNORMAL HIGH (ref 11.5–15.5)
WBC: 7.3 10*3/uL (ref 4.0–10.5)
nRBC: 1.1 % — ABNORMAL HIGH (ref 0.0–0.2)

## 2020-10-27 NOTE — Progress Notes (Signed)
Daily Progress Note   Patient Name: Dillon Perkins       Date: 10/27/2020 DOB: 05-05-1951  Age: 70 y.o. MRN#: [id number] Attending Physician: Little Ishikawa, MD Primary Care Physician: Antony Contras, MD Admit Date: 10/21/2020  Reason for Consultation/Follow-up: Establishing goals of care "ESRD, approaching end stage liver"  Subjective: I met with wife/Chris outside the room. Patient had flexi-seal removed earlier and is currently on the bedside commode. Plan is for paracentesis tomorrow, then discharge home. [name] has emphasized to [name] that if "this doesn't work", the alternative plan is transition to hospice. She states [name] is ok with that.  I let [name] know I had spoken with liaison from [facility] and made the referral for outpatient palliative. Per liaison, they would likely be able to see him within the next week. I provided her with the phone number for the referral line. [name] expresses appreciation for PMT support.    Length of Stay: 6  Current Medications: Scheduled Meds:  . chlorhexidine  15 mL Mouth Rinse BID  . Chlorhexidine Gluconate Cloth  6 each Topical Q0600  . darbepoetin (ARANESP) injection - DIALYSIS  60 mcg Intravenous Q Wed-HD  . feeding supplement  1 Container Oral TID BM  . lactulose  20 g Oral BID  . mouth rinse  15 mL Mouth Rinse q12n4p  . midodrine  20 mg Oral TID WC  . multivitamin  1 tablet Oral QHS  . pantoprazole  40 mg Intravenous Q12H  . rifaximin  550 mg Oral BID  . sodium chloride flush  10-40 mL Intracatheter Q12H  . sodium chloride flush  3 mL Intravenous Q12H  . sucralfate  1 g Oral TID WC & HS      PRN Meds: acetaminophen, hydrocortisone cream, metoprolol tartrate, prochlorperazine, sodium chloride flush       Vital  Signs: BP 111/76 (BP Location: Left Arm)   Pulse 94   Temp 97.9 F (36.6 C) (Oral)   Resp 19   Ht '5\' 8"'  (1.727 m)   Wt 80.1 kg   SpO2 95%   BMI 26.85 kg/m  SpO2: SpO2: 95 % O2 Device: O2 Device: Room Air O2 Flow Rate: O2 Flow Rate (L/min): 3 L/min  Intake/output summary:   Intake/Output Summary (Last 24 hours) at 10/27/2020 1857 Last data  filed at 10/26/2020 2300 Gross per 24 hour  Intake --  Output 75 ml  Net -75 ml   LBM: Last BM Date: 10/25/20 Baseline Weight: Weight: 83.6 kg Most recent weight: Weight: 80.1 kg       Palliative Assessment/Data: PPS 30-40%        Palliative Care Assessment & Plan   HPI/Patient Profile: 70 y.o. 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 Recently hospitalized 4/6-4/11 with acute encephalopathy and elevated ammonia level.  He presented to the emergency department on 10/21/2020 with dizziness and blood in his stool. In the ED, he was started on protonix infusion, octreotide, and vitamin K. He was admitted to [facility] with acute GI bleed with symptomatic anemia.    EGD 4/19 showed non-obstructing non-bleeding duodenal ulcers as the source of GI bleeding. Post procedure patient developed hypotension requiring low dose NEO and transfer to ICU.    MELD score on admission of 35.    Assessment: - acute GI bleed with symptomatic anemia - alcoholic cirrhosis - hepatocellular carcinoma s/p Y90 and immunotherapy - portal hypertension with esophageal varices - persistent a fib with RVR - ERSD on HD - Generalized weakness  Recommendations/Plan:  DNR/DNI as previously documented  Continue current medical care  Plan for discharge home with outpatient dialysis 3 times  per week and paracentesis twice weekly  Referral to outpatient palliative - TOC order entered and spoke with liaison for [facility]    Goals of Care and Additional Recommendations:  Limitations on Scope of Treatment: Full Scope Treatment  Code Status: DNR/DNI  Prognosis:  Very poor  Discharge Planning:  Home with Palliative Services   Thank you for allowing the Palliative Medicine Team to assist in the care of this patient.   Total Time 25 minutes Prolonged Time Billed  no       Greater than 50%  of this time was spent counseling and coordinating care related to the above assessment and plan.  Lavena Bullion, NP  Please contact Palliative Medicine Team phone at [phone] for questions and concerns.

## 2020-10-27 NOTE — Discharge Summary (Incomplete)
Physician Discharge Summary  Dillon Perkins:[id number] DOB: September 03, 1950 DOA: 10/21/2020  PCP: Antony Contras, MD  Admit date: 10/21/2020 Discharge date: 10/27/2020  Admitted From: *** Disposition:  ***  Recommendations for Outpatient Follow-up:  1. Follow up with PCP in 1-2 weeks 2. Please obtain BMP/CBC in one week 3. Please follow up on the following pending results:  Home Health:***  Equipment/Devices:***  Discharge Condition:***  CODE STATUS:***  Diet recommendation:    Brief/Interim Summary: Dillon C Hofmannis a 70 y.o.malewith medical history significant foralcoholic cirrhosis with portal hypertension, severe portal hypertensive gastropathy, and esophageal varices, hepatocellular carcinoma s/p radioembolization and immunotherapy with nivolumab, persistent atrial fibrillation not on anticoagulation due to risk of bleeding, ESRD started on HD TTS 10/09/2019, chronic hypotension on midodrine who presents to the ED for evaluation of dizziness andblood in hisstools - initially bright red blood and then burgundy colored stool. He has not seen any other obvious bleeding including epistaxis, hemoptysis, hematemesis, hematuria. He has not seen any dark black color stool. He denies any nausea or vomiting. He feels as if his abdomen is beginning to retain fluid again. He denies any chest pain or dyspnea. He has not seen any swelling in his lower extremities. He reports adherence to dialysis with last treatment on 4/16. He receives dialysis through a dialysis catheter.  *** *** *** *** HHPT --> Transport issues? Paracentesis Mon/Friday (***HOW DO I SCHEDULE THIS?)  Discharge Diagnoses:  Principal Problem:   GI bleed Active Problems:   Atrial fibrillation with RVR (HCC)   Hepatocellular carcinoma (HCC)   ESRD on dialysis ([place])   Alcoholic cirrhosis of liver with ascites ([place])   Ascites   Acute GI bleeding    Discharge Instructions   Allergies as of 10/27/2020       Reactions   Losartan Other (See Comments), Cough   Dizziness and possible cough   Losartan Potassium Other (See Comments), Cough   Dizziness and possible cough   Other Other (See Comments)   External lotions for itching- INEFFECTIVE   Rivaroxaban Other (See Comments)   Gross hematuria    Med Rec must be completed prior to using this SMARTLINK***       Allergies  Allergen Reactions  . Losartan Other (See Comments) and Cough    Dizziness and possible cough   . Losartan Potassium Other (See Comments) and Cough    Dizziness and possible cough  . Other Other (See Comments)    External lotions for itching- INEFFECTIVE  . Rivaroxaban Other (See Comments)    Gross hematuria     Consultations:  ***Specify Physician/Group   Procedures/Studies: CT Abdomen Pelvis Wo Contrast  Result Date: 10/09/2020 CLINICAL DATA:  Abdominal distension. 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 COMPARISON:  MRI from 07/04/2020 FINDINGS: Lower chest: Trace bilateral pleural effusions identified. Hepatobiliary: The liver appears cirrhotic. Treated tumor within segment 2 is measures 5.3 x 4.3 cm on today's exam, image 17/3. This is compared with 5.0 x 4.6 cm previously. The other known liver lesions are difficult to assess reflecting lack of IV contrast material. Sludge and tiny stones identified within the dependent portion of the gallbladder. Pancreas: Unremarkable. No pancreatic ductal dilatation or surrounding inflammatory changes. Spleen: Normal in size without focal abnormality. Adrenals/Urinary Tract: Normal appearance of the adrenal glands. Small bilateral renal calculi identified. No hydronephrosis. Urinary bladder  is unremarkable. Stomach/Bowel: The stomach appears nondistended. Small bowel loops  are unremarkable. Mild diffuse colonic wall thickening is nonspecific in the setting of ascites and portal venous hypertension. Vascular/Lymphatic: Aortic atherosclerosis. No aneurysm. Previous GDA embolization. Esophageal and upper abdominal varices. No abdominopelvic adenopathy. Reproductive: Prostate is unremarkable. Other: Large volume of ascites within the abdomen and pelvis. A left inguinal hernia is identified which is filled with ascites as well as a nonobstructed loop of sigmoid colon. No discrete fluid collections identified. Musculoskeletal: Bilateral L5 pars defects identified. Mild anterolisthesis of L5 on S1 noted. IMPRESSION: 1. Large volume of ascites identified within the abdomen and pelvis. 2. Cirrhosis with known multifocal hepatocellular carcinoma. Status post Y 90 microsphere ablation. Dominant lesion in segment 2 appears similar to 07/04/2020 3.  Aortic Atherosclerosis (ICD10-I70.0). 4. Gallstones. 5. Trace pleural effusions. Electronically Signed   By: Kerby Moors M.D.   On: 10/09/2020 13:49   DG Chest 1 View  Result Date: 10/21/2020 CLINICAL DATA:  Fall, dizziness, low back pain EXAM: CHEST  1 VIEW COMPARISON:  10/09/2020 FINDINGS: Lungs are clear.  No pleural effusion or pneumothorax. The heart is top-normal in size. Right chest dual lumen dialysis catheter. IMPRESSION: No evidence of acute cardiopulmonary disease. Electronically Signed   By: Julian Hy M.D.   On: 10/21/2020 15:54   CT Head Wo Contrast  Result Date: 10/09/2020 CLINICAL DATA:  Neurological deficit EXAM: CT HEAD WITHOUT CONTRAST TECHNIQUE: Contiguous axial images were obtained from the base of the skull through the vertex without intravenous contrast. COMPARISON:  Prior head CT 05/29/2020 FINDINGS: Brain: No evidence of acute infarction, hemorrhage, hydrocephalus, extra-axial collection or mass lesion/mass effect. Stable scattered foci of white matter hypoattenuation consistent with chronic microvascular  ischemic white matter disease. Tiny bilateral cerebellar hemisphere lacunar infarcts are also unchanged. Mild cortical atrophy without interval progression. Vascular: No hyperdense vessel or unexpected calcification. Atherosclerotic vascular calcifications in the bilateral cavernous and supraclinoid carotid arteries. Skull: Normal. Negative for fracture or focal lesion. Sinuses/Orbits: No acute finding. Other: None. IMPRESSION: 1. No acute intracranial abnormality. 2. Stable cortical atrophy, chronic microvascular ischemic white matter disease and bilateral cerebellar hemisphere lacunar infarcts. Electronically Signed   By: Jacqulynn Cadet M.D.   On: 10/09/2020 11:00   MR LIVER W WO CONTRAST  Result Date: 10/13/2020 CLINICAL DATA:  70 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 Staging examination. Acute hepatic encephalopathy. EXAM: MRI ABDOMEN WITHOUT AND WITH CONTRAST TECHNIQUE: Multiplanar multisequence MR imaging of the abdomen was performed both before and after the administration of intravenous contrast. CONTRAST:  43mL GADAVIST GADOBUTROL 1 MMOL/ML IV SOLN COMPARISON:  Abdominal MRI 07/04/2020. FINDINGS: Lower chest: Small hiatal hernia.  Mild cardiomegaly. Hepatobiliary: Liver has a shrunken appearance and nodular contour, indicative of advanced cirrhosis. Diffuse loss of signal intensity throughout the hepatic parenchyma on in phase dual echo images and diffuse low T2 signal intensity throughout the hepatic parenchyma, indicative of extensive iron deposition. Numerous T1 and T2 hypointense nodules scattered throughout the hepatic parenchyma, most of which are subcentimeter in size and do not enhance. In addition, there are 2 larger lesions which are T1 hyperintense and generally T2 hypointense, without enhancement measuring 5.4 x 4.4 cm in segment 2 (axial image 29 of series 10/1) which is stable compared to the prior examination, and 5.8 x 3.9 cm predominantly in segments 5 and 6 (axial image  42 of series 10/1) which is increased in size compared to the prior study (previously 5.3 x 3.2 cm). Both of these lesions demonstrates  some degree of diffusion restriction. In segment 3 of the liver there is a T1 hypointense nonenhancing area (axial image 38 of series 1038) which corresponds to a previously noted lesion, likely a indicative of post embolization scarring. Additional lesion in segment 8 of the liver (axial image 34 of series 10/1) measuring 1.3 cm in diameter, stable in retrospect compared to the prior study, also mildly T1 hyperintense, T2 hypointense without definite enhancement on post gadolinium imaging. Other previously noted smaller lesions are not confidently identified on today's examination. No intra or extrahepatic biliary ductal dilatation. Pancreas: Multiple tiny T1 hypointense, T2 hyperintense, nonenhancing lesions are noted throughout the pancreas, largest of which is in the tail of the pancreas measuring only 11 mm, similar in size and number to the prior examination, favored to represent small benign lesions such as pancreatic pseudocysts or side branch IPMNs (intraductal papillary mucinous neoplasms). No definite communication with the main pancreatic duct noted. No peripancreatic fluid collections. Spleen: Spleen is mildly enlarged measuring 11.1 x 6.9 x 10.9 cm (estimated splenic volume of 417 mL) . Adrenals/Urinary Tract: bilateral kidneys and adrenal glands are normal in appearance. No hydroureteronephrosis in the visualized portions of the abdomen. Stomach/Bowel: Visualized portions are unremarkable. Vascular/Lymphatic: Aortic atherosclerosis, without evidence of aneurysm in the visualized abdominal vasculature. Portal vein is patent and normal in caliber. No lymphadenopathy noted in the abdomen. Other:  Moderate to large volume of ascites. Musculoskeletal: No aggressive appearing osseous lesions are noted in the visualized portions of the skeleton. IMPRESSION: 1. Advanced  cirrhosis severe iron deposition throughout the liver and multiple hepatic lesions, as above. Many of the previously noted suspicious lesions have regressed or disappeared following prior embolization procedure. The other lesions which remain are generally stable compared to the prior study, with the exception of the lesion in the right lobe of the liver between segments 5 and 6 which is slightly larger than the prior study, but demonstrates no definitive internal enhancement. Continued attention on follow-up studies is recommended to ensure stability or regression. 2. Moderate to large volume of ascites. 3. Multiple small cystic lesions scattered throughout the pancreas measuring 1.1 cm in size or less, stable compared to prior studies, likely to represent benign lesions as discussed above. Attention at time of routine follow-up is recommended to ensure continued stability. 4. Small hiatal hernia. 5. Mild cardiomegaly. Electronically Signed   By: Vinnie Langton M.D.   On: 10/13/2020 09:57   DG Chest Port 1 View  Result Date: 10/09/2020 CLINICAL DATA:  Altered mental status. History of hepatocellular carcinoma EXAM: PORTABLE CHEST 1 VIEW COMPARISON:  May 22, 2020. FINDINGS: Note that a small amount of the lateral left base not included. Central catheter tip is in the right atrium. No pneumothorax. There is no edema or airspace opacity. Heart is mildly enlarged with pulmonary vascularity normal. No adenopathy. There is aortic atherosclerosis. No bone lesions. A focus of calcification is noted in the left carotid artery. IMPRESSION: Note that a small portion of the lateral left base not included. Visualized lungs clear. Stable cardiac prominence. Central catheter tip in right atrium without pneumothorax. Aortic Atherosclerosis (ICD10-I70.0). Focus of calcification noted in left carotid artery. Electronically Signed   By: Lowella Grip III M.D.   On: 10/09/2020 09:36   US LIVER DOPPLER  Result Date:  10/10/2020 CLINICAL DATA:  Cirrhosis of liver with ascites EXAM: DUPLEX ULTRASOUND OF LIVER TECHNIQUE: Color and duplex Doppler ultrasound was performed to evaluate the hepatic in-flow and out-flow vessels. COMPARISON:  CT  abdomen/pelvis yesterday FINDINGS: Liver: Diffusely nodular liver contour. The liver appears small in the parenchyma is diffusely heterogeneous and echogenic. Hypoechoic soft tissue mass in the left upper lobe measures 4.8 x 4.4 x 5.0 cm. Hypoechoic solid mass in the right hepatic lobe measures approximately 4.9 x 5.7 x 8.3 cm. Diffusely thickened gallbladder wall. Sludge is present within the gallbladder lumen. The common bile duct is normal at 3 mm. Main Portal Vein size: 1.2 cm Portal Vein Velocities Main Prox:  41 cm/sec with hepatopetal directional flow. Main Mid: 55 cm/sec with hepatopetal directional flow. Main Dist:  71 cm/sec with hepatopetal directional flow. Right: Unable to visualize Left: 28 cm/sec with hepatopetal directional flow. Hepatic Vein Velocities Right:  27 cm/sec Middle:  32 cm/sec Left:  68 cm/sec IVC: Present and patent with normal respiratory phasicity. Hepatic Artery Velocity:  109 cm/sec Splenic Vein Velocity:  34 cm/sec Spleen: 8.4 cm x 12.0 cm x 8.2 cm with a total volume of 436 cm^3 (411 cm^3 is upper limit normal) Portal Vein Occlusion/Thrombus: No Splenic Vein Occlusion/Thrombus: No Ascites: None Varices: Moderate ascites. IMPRESSION: 1. 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 2. Portal veins remain patent with hepatopetal directional flow. 3. Previously treated left hepatic mass measures approximately 4.8 x 4.4 x 5.0 cm. Viability of the lesion cannot be assessed with ultrasound. 4. Right hepatic mass measuring approximately 4.9 x 5.7 x 8.3 cm not identified on prior imaging. Recommend further evaluation with gadolinium-enhanced MRI of the abdomen when the patient's clinical status is improved, preferably as an  outpatient to facilitate optimal imaging quality and diagnostic sensitivity. Electronically Signed   By: Jacqulynn Cadet M.D.   On: 10/10/2020 07:45   IR Paracentesis  Result Date: 10/24/2020 INDICATION: Patient with history of hepatocellular carcinoma, end-stage renal disease, alcoholic cirrhosis, recurrent ascites. Request received for therapeutic paracentesis. EXAM: ULTRASOUND GUIDED THERAPEUTIC PARACENTESIS MEDICATIONS: 1% lidocaine to skin and subcutaneous tissue COMPLICATIONS: None immediate. PROCEDURE: Informed written consent was obtained from the patient after a discussion of the risks, benefits and alternatives to treatment. A timeout was performed prior to the initiation of the procedure. Initial ultrasound scanning demonstrates a large amount of ascites within the right upper to mid abdominal quadrant. The right upper to mid abdomen was prepped and draped in the usual sterile fashion. 1% lidocaine was used for local anesthesia. Following this, a 19 gauge, 7-cm, Yueh catheter was introduced. An ultrasound image was saved for documentation purposes. The paracentesis was performed. The catheter was removed and a dressing was applied. The patient tolerated the procedure well without immediate post procedural complication. FINDINGS: A total of approximately 3.3 liters of yellow fluid was removed. IMPRESSION: Successful ultrasound-guided therapeutic paracentesis yielding 3.3 liters of peritoneal fluid. Read by: Rowe Robert, PA-C Electronically Signed   By: Markus Daft M.D.   On: 10/24/2020 16:37   IR Paracentesis  Result Date: 10/14/2020 INDICATION: Patient with a history of hepatocellular carcinoma and alcoholic cirrhosis presents today with ascites. Interventional Radiology asked to perform a therapeutic paracentesis. EXAM: ULTRASOUND GUIDED PARACENTESIS MEDICATIONS: 1% lidocaine 10 mL COMPLICATIONS: None immediate. PROCEDURE: Informed written consent was obtained from the patient after a  discussion of the risks, benefits and alternatives to treatment. A timeout was performed prior to the initiation of the procedure. Initial ultrasound scanning demonstrates a large amount of ascites within the left lower abdominal quadrant. The left lower abdomen was prepped and draped in the usual sterile fashion. 1% lidocaine was used for local anesthesia. Following  this, a 19 gauge, 7-cm, Yueh catheter was introduced. An ultrasound image was saved for documentation purposes. The paracentesis was performed. The catheter was removed and a dressing was applied. The patient tolerated the procedure well without immediate post procedural complication. FINDINGS: A total of approximately 4.6 of clear yellow fluid was removed. IMPRESSION: Successful ultrasound-guided paracentesis yielding 4.6 liters of peritoneal fluid. Read by: Soyla Dryer, NP Electronically Signed   By: Corrie Mckusick D.O.   On: 10/14/2020 13:02   IR Paracentesis  Result Date: 10/10/2020 INDICATION: History of multifocal hepatocellular cancer. Recurrent ascites. Request for diagnostic and therapeutic paracentesis. EXAM: ULTRASOUND GUIDED LEFT LOWER QUADRANT PARACENTESIS MEDICATIONS: 1% plain lidocaine, 5 mL COMPLICATIONS: None immediate. PROCEDURE: Informed written consent was obtained from the patient after a discussion of the risks, benefits and alternatives to treatment. A timeout was performed prior to the initiation of the procedure. Initial ultrasound scanning demonstrates a large amount of ascites within the left lower abdominal quadrant. The left lower abdomen was prepped and draped in the usual sterile fashion. 1% lidocaine was used for local anesthesia. Following this, a 19 gauge, 7-cm, Yueh catheter was introduced. An ultrasound image was saved for documentation purposes. The paracentesis was performed. The catheter was removed and a dressing was applied. The patient tolerated the procedure well without immediate post procedural  complication. FINDINGS: A total of approximately 5.8 L of clear yellow fluid was removed. Samples were sent to the laboratory as requested by the clinical team. IMPRESSION: Successful ultrasound-guided paracentesis yielding 5.8 liters of peritoneal fluid. Read by: Ascencion Dike PA-C Electronically Signed   By: Sandi Mariscal M.D.   On: 10/10/2020 15:40   IR Paracentesis  Result Date: 10/07/2020 INDICATION: Patient with history of cirrhosis, recurrent ascites. Request is made for therapeutic paracentesis of up to 3 L maximum. EXAM: ULTRASOUND GUIDED THERAPEUTIC PARACENTESIS MEDICATIONS: 10 mL 1% lidocaine COMPLICATIONS: None immediate. PROCEDURE: Informed written consent was obtained from the patient after a discussion of the risks, benefits and alternatives to treatment. A timeout was performed prior to the initiation of the procedure. Initial ultrasound scanning demonstrates a large amount of ascites within the right lower abdominal quadrant. The right lower abdomen was prepped and draped in the usual sterile fashion. 1% lidocaine was used for local anesthesia. Following this, a 19 gauge, 7-cm, Yueh catheter was introduced. An ultrasound image was saved for documentation purposes. The paracentesis was performed. The catheter was removed and a dressing was applied. The patient tolerated the procedure well without immediate post procedural complication. FINDINGS: A total of approximately 3.0 liters of yellow fluid was removed. IMPRESSION: Successful ultrasound-guided therapeutic paracentesis yielding 3.0 liters of peritoneal fluid. Read by: Brynda Greathouse PA-C Electronically Signed   By: Aletta Edouard M.D.   On: 10/07/2020 13:19      Subjective: ***   Discharge Exam: Vitals:   10/27/20 0400 10/27/20 0747  BP: 101/77 101/69  Pulse: 100 (!) 102  Resp: 14 17  Temp: 98 F (36.7 C) 97.6 F (36.4 C)  SpO2: 97% 99%   Vitals:   10/26/20 2000 10/26/20 2355 10/27/20 0400 10/27/20 0747  BP: 97/73 92/62  101/77 101/69  Pulse: 93 61 100 (!) 102  Resp: 16 15 14 17   Temp: 98 F (36.7 C) 98 F (36.7 C) 98 F (36.7 C) 97.6 F (36.4 C)  TempSrc: Oral Oral Oral Oral  SpO2: 99% 98% 97% 99%  Weight:      Height:        General: Pt is  alert, awake, not in acute distress Cardiovascular: RRR, S1/S2 +, no rubs, no gallops Respiratory: CTA bilaterally, no wheezing, no rhonchi Abdominal: Soft, NT, ND, bowel sounds + Extremities: no edema, no cyanosis    The results of significant diagnostics from this hospitalization (including imaging, microbiology, ancillary and laboratory) are listed below for reference.     Microbiology: Recent Results (from the past 240 hour(s))  Resp Panel by RT-PCR (Flu A&B, Covid) Nasopharyngeal Swab     Status: None   Collection Time: 10/21/20  4:51 PM   Specimen: Nasopharyngeal Swab; Nasopharyngeal(NP) swabs in vial transport medium  Result Value Ref Range Status   SARS Coronavirus 2 by RT PCR NEGATIVE NEGATIVE Final    Comment: (NOTE) SARS-CoV-2 target nucleic acids are NOT DETECTED.  The SARS-CoV-2 RNA is generally detectable in upper respiratory specimens during the acute phase of infection. The lowest concentration of SARS-CoV-2 viral copies this assay can detect is 138 copies/mL. A negative result does not preclude SARS-Cov-2 infection and should not be used as the sole basis for treatment or other patient management decisions. A negative result may occur with  improper specimen collection/handling, submission of specimen other than nasopharyngeal swab, presence of viral mutation(s) within the areas targeted by this assay, and inadequate number of viral copies(<138 copies/mL). A negative result must be combined with clinical observations, patient history, and epidemiological information. The expected result is Negative.  Fact Sheet for Patients:  EntrepreneurPulse.com.au  Fact Sheet for Healthcare Providers:   IncredibleEmployment.be  This test is no t yet approved or cleared by the [place] FDA and  has been authorized for detection and/or diagnosis of SARS-CoV-2 by FDA under an Emergency Use Authorization (EUA). This EUA will remain  in effect (meaning this test can be used) for the duration of the COVID-19 declaration under Section 564(b)(1) of the Act, 21 U.S.C.section 360bbb-3(b)(1), unless the authorization is terminated  or revoked sooner.       Influenza A by PCR NEGATIVE NEGATIVE Final   Influenza B by PCR NEGATIVE NEGATIVE Final    Comment: (NOTE) The Xpert Xpress SARS-CoV-2/FLU/RSV plus assay is intended as an aid in the diagnosis of influenza from Nasopharyngeal swab specimens and should not be used as a sole basis for treatment. Nasal washings and aspirates are unacceptable for Xpert Xpress SARS-CoV-2/FLU/RSV testing.  Fact Sheet for Patients: EntrepreneurPulse.com.au  Fact Sheet for Healthcare Providers: IncredibleEmployment.be  This test is not yet approved or cleared by the [place] FDA and has been authorized for detection and/or diagnosis of SARS-CoV-2 by FDA under an Emergency Use Authorization (EUA). This EUA will remain in effect (meaning this test can be used) for the duration of the COVID-19 declaration under Section 564(b)(1) of the Act, 21 U.S.C. section 360bbb-3(b)(1), unless the authorization is terminated or revoked.  Performed at [facility], [place] [address], [place], [place] [address]      Labs: BNP (last 3 results) Recent Labs    02/27/20 0827 07/04/20 1010  BNP 326.6* 9,937.1*   Basic Metabolic Panel: Recent Labs  Lab 10/22/20 1632 10/22/20 2357 10/24/20 0354 10/25/20 0152 10/26/20 0103 10/27/20 0057  NA  --  132* 135 134* 133* 132*  K  --  4.8 4.0 3.8 3.8 3.2*  CL  --  95* 99 98 98 97*  CO2  --  23 26 27 24 24   GLUCOSE  --  141* 154* 109* 116* 93  BUN  --   76* 46* 27* 41* 24*  CREATININE  --  5.85* 4.32* 3.21* 4.37* 3.18*  CALCIUM  --  8.8* 8.8* 8.8* 8.8* 8.5*  MG  --  2.4  --   --   --   --   PHOS 8.1* 7.5*  --   --   --   --    Liver Function Tests: Recent Labs  Lab 10/21/20 1503 10/22/20 0047  AST 65* 71*  ALT 46* 55*  ALKPHOS 94 88  BILITOT 5.2* 7.2*  PROT 5.5* 6.0*  ALBUMIN 2.7* 2.9*   No results for input(s): LIPASE, AMYLASE in the last 168 hours. Recent Labs  Lab 10/21/20 1525  AMMONIA 83*   CBC: Recent Labs  Lab 10/21/20 1503 10/21/20 1533 10/22/20 2357 10/24/20 0354 10/24/20 1423 10/25/20 0152 10/26/20 0103 10/27/20 0057  WBC 10.8*   < > 8.5 8.7  --  8.2 7.3 7.3  NEUTROABS 9.0*  --   --   --   --   --   --   --   HGB 7.7*   < > 7.4* 6.8* 7.9* 8.1* 7.8* 7.8*  HCT 24.1*   < > 21.9* 20.3* 23.4* 23.8* 23.3* 23.5*  MCV 112.6*   < > 104.8* 106.8*  --  105.3* 105.9* 106.8*  PLT 126*   < > 100* 108*  --  103* 112* 91*   < > = values in this interval not displayed.   Cardiac Enzymes: No results for input(s): CKTOTAL, CKMB, CKMBINDEX, TROPONINI in the last 168 hours. BNP: Invalid input(s): POCBNP CBG: Recent Labs  Lab 10/24/20 2032  GLUCAP 92   D-Dimer No results for input(s): DDIMER in the last 72 hours. Hgb A1c No results for input(s): HGBA1C in the last 72 hours. Lipid Profile No results for input(s): CHOL, HDL, LDLCALC, TRIG, CHOLHDL, LDLDIRECT in the last 72 hours. Thyroid function studies No results for input(s): TSH, T4TOTAL, T3FREE, THYROIDAB in the last 72 hours.  Invalid input(s): FREET3 Anemia work up No results for input(s): VITAMINB12, FOLATE, FERRITIN, TIBC, IRON, RETICCTPCT in the last 72 hours. Urinalysis    Component Value Date/Time   COLORURINE AMBER (A) 10/09/2020 1153   APPEARANCEUR CLEAR 10/09/2020 1153   LABSPEC 1.016 10/09/2020 1153   PHURINE 5.0 10/09/2020 1153   GLUCOSEU NEGATIVE 10/09/2020 1153   HGBUR NEGATIVE 10/09/2020 1153   BILIRUBINUR NEGATIVE 10/09/2020 1153    KETONESUR NEGATIVE 10/09/2020 1153   PROTEINUR NEGATIVE 10/09/2020 1153   UROBILINOGEN 1.0 07/23/2014 0402   NITRITE NEGATIVE 10/09/2020 1153   LEUKOCYTESUR SMALL (A) 10/09/2020 1153   Sepsis Labs Invalid input(s): PROCALCITONIN,  WBC,  LACTICIDVEN Microbiology Recent Results (from the past 240 hour(s))  Resp Panel by RT-PCR (Flu A&B, Covid) Nasopharyngeal Swab     Status: None   Collection Time: 10/21/20  4:51 PM   Specimen: Nasopharyngeal Swab; Nasopharyngeal(NP) swabs in vial transport medium  Result Value Ref Range Status   SARS Coronavirus 2 by RT PCR NEGATIVE NEGATIVE Final    Comment: (NOTE) SARS-CoV-2 target nucleic acids are NOT DETECTED.  The SARS-CoV-2 RNA is generally detectable in upper respiratory specimens during the acute phase of infection. The lowest concentration of SARS-CoV-2 viral copies this assay can detect is 138 copies/mL. A negative result does not preclude SARS-Cov-2 infection and should not be used as the sole basis for treatment or other patient management decisions. A negative result may occur with  improper specimen collection/handling, submission of specimen other than nasopharyngeal swab, presence of viral mutation(s) within the areas targeted by this assay, and inadequate number of  viral copies(<138 copies/mL). A negative result must be combined with clinical observations, patient history, and epidemiological information. The expected result is Negative.  Fact Sheet for Patients:  EntrepreneurPulse.com.au  Fact Sheet for Healthcare Providers:  IncredibleEmployment.be  This test is no t yet approved or cleared by the [place] FDA and  has been authorized for detection and/or diagnosis of SARS-CoV-2 by FDA under an Emergency Use Authorization (EUA). This EUA will remain  in effect (meaning this test can be used) for the duration of the COVID-19 declaration under Section 564(b)(1) of the Act,  21 U.S.C.section 360bbb-3(b)(1), unless the authorization is terminated  or revoked sooner.       Influenza A by PCR NEGATIVE NEGATIVE Final   Influenza B by PCR NEGATIVE NEGATIVE Final    Comment: (NOTE) The Xpert Xpress SARS-CoV-2/FLU/RSV plus assay is intended as an aid in the diagnosis of influenza from Nasopharyngeal swab specimens and should not be used as a sole basis for treatment. Nasal washings and aspirates are unacceptable for Xpert Xpress SARS-CoV-2/FLU/RSV testing.  Fact Sheet for Patients: EntrepreneurPulse.com.au  Fact Sheet for Healthcare Providers: IncredibleEmployment.be  This test is not yet approved or cleared by the [place] FDA and has been authorized for detection and/or diagnosis of SARS-CoV-2 by FDA under an Emergency Use Authorization (EUA). This EUA will remain in effect (meaning this test can be used) for the duration of the COVID-19 declaration under Section 564(b)(1) of the Act, 21 U.S.C. section 360bbb-3(b)(1), unless the authorization is terminated or revoked.  Performed at [facility], [place] [address], [place], [place] [address]      Time coordinating discharge: Over 30 minutes  SIGNED:   Little Ishikawa, DO [facility] 10/27/2020, 7:52 AM Pager   If 7PM-7AM, please contact night-coverage [email or web address]

## 2020-10-27 NOTE — Progress Notes (Signed)
PROGRESS NOTE    Dillon Perkins  FKC:[id number] DOB: Jun 06, 1951 DOA: 10/21/2020 PCP: Antony Contras, MD   Brief Narrative:  Dillon Perkins is a 70 y.o. 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 He has not seen any other obvious bleeding including epistaxis, hemoptysis, hematemesis, hematuria.  He has not seen any dark black color stool.  He denies any nausea or vomiting.  He feels as if his abdomen is beginning to retain fluid again.  He denies any chest pain or dyspnea.  He has not seen any swelling in his lower extremities.  He reports adherence to dialysis with last treatment on 4/16.  He receives dialysis through a dialysis catheter.  Patient admitted as above with acute blood loss anemia profound hypotension in the setting of hepatocellular carcinoma, end-stage liver disease alcoholic cirrhosis and end-stage kidney failure on dialysis Tuesday Thursday Saturday.  Patient's anemia resolved, upper endoscopy was unremarkable for acute bleeding.  Patient's hemoglobin is now stabilized, he continues to be markedly hypotensive having difficulty initially with dialysis and recurrent paracentesis which he needs to maintain volume status.  Lengthy discussion was had daily with patient and palliative care given his tenuous circumstances and likely will have worsening condition over the next few weeks.  We discussed discharge home with hospice versus hospice house.  Patient does wish to return home but is not yet ready to give up on dialysis  or recurrent paracentesis.  At this point this means the patient will be transferred to dialysis on Tuesday Thursday Saturday with planned paracentesis on Monday and Friday meaning that 5 days of the week the patient will be requiring transport given his new ambulatory dysfunction and weakness he is unstable to transverse stairs in front of his house which makes transport family and his wife markedly limited.  Plan is to have final paracentesis on 10/28/2020 with ultimate discharge home and to set up transport for dialysis and biweekly paracentesis as described above.  Ultimately patient's blood pressure and symptom management with dialysis will likely be the major factor in whether or not he continues dialysis in the interim or will transition to hospice.  Wife was at bedside for most of our discussions and understands how sick the patient is.  She is agreeable for hospice and having the patient home with symptom care.  Patient however continues to wish for more aggressive treatment in the setting of dialysis and paracentesis.  We did discuss that should patient choose to move forward with hospice we could provide an abdominal drain for symptom management but this would not be a reasonable solution in the interim while he continues to seek somewhat aggressive care for his end-stage liver disease given his high risk for bleeding and infection.  Patient otherwise stable and agreeable for discharge home, continue to await paracentesis, further evaluation with interventional radiology and transport issues as discussed above.  Assessment & Plan:   Principal Problem:   GI bleed Active Problems:   Atrial fibrillation with RVR (HCC)   Hepatocellular carcinoma (HCC)   ESRD on dialysis ([place])   Alcoholic cirrhosis of liver with ascites (HCC)   Ascites  Acute GI bleeding   Goals of care Palliative care/advanced care planning - Acute on chronic hypotension in the setting of ESRD and advanced alcoholic cirrhosis  (see below) -Palliative care and myself had multiple discussions with patient and his wife. -Given patient currently requires dialysis 3 times a week, will likely need paracentesis twice per week he is looking at spending 5 days a week out of house and some sort of facility which we discussed was not consistent with our previous goals of care and improvement in quality of life as his renal and hepatic function continue to wane and his disease is progressed. -Patient continues to iterate that his goal is to come home which we discussed would be quite difficult given his need for transport 5 days a week. -Patient remains DNR, this decision was made previously in the setting of his advanced disease -At this time patient and wife do not appear to be moving forward with hospice discussion, as such I do not believe it is in the patient's best interest to place a abdominal port/catheter for fluid drainage in the setting of complication and infection risk.  This could be done if patient were hospice for comfort measures only but this is not a risk-free endeavor and as such it would only be placed at end-of-life care to improve comfort and to ensure patient does not require unnecessary transport or intervention.  Acute GI bleed with symptomatic anemia, stabilizing Alcoholic cirrhosis Hepatocellular carcinoma s/p Y 90 and nivolumab Portal hypertension with esophageal varices: -Hemoglobin minimally downtrending without overt signs of bleeding -Previously completed ceftriaxone PPI drip -No recurrent bleeding thus no need for new transfusion, 2 units since admission total - MELD score is 35 -poor prognostic indicator - Planned for twice weekly paracentesis in the near future - At this point will advance diet back to renal diet now that we are leaning away from further intervention or procedure.  If patient does wish to have abdominal drain placed will follow with IR, unclear if he needs to be n.p.o. for this  procedure - Continue rifaximin 550 mg twice daily - Vitamin K x3 days (completed0 - Continue lactulose, goal 3 bowel movements per day  Persistent atrial fibrillation with RVR: - Chronically difficult to control A. fib due to sensitivity to medications.   - Rate largely maintaining between 100-120 on admission, would not be too aggressive due to risk for hypotension.   - Not on anticoagulation due to bleeding risk.   - PRN IV metoprolol 2.5 mg if needed for persistent HR >120, hold for BP <90/60.  Atypical chest pain in the setting of profound hypotension, A. fib RVR, resolved - Chest pain resolved with resolution of hypotension - Troponin elevated to 4000, cardiology sidelined, evaluated November 2021, was not a candidate for cardiac cath at that time and he is arguably a much worse candidate now. - We will continue supportive care, attempt to maintain MAP greater than 65 which is likely his primary etiology for his chest pain given its onset with hypotension.  ESRD on TTS HD: - Nephrology following, appreciate insight and recommendations -Discussion today about hypotension during dialysis, poor prognostic indicator, already on max dose midodrine per pharmacy recommendations -Patient's tolerance of dialysis will likely dictate his ongoing care moving forward.  As he does not wish to quit dialysis for hospice.  He does continue to have profound hypotension and our concern is that if he cannot tolerate dialysis he will continue to decline quite rapidly.  DVT prophylaxis: SCDs  Code Status: DNR, confirmed with patient and wife Family Communication: Wife updated at bedside  Status is: Inpatient  Dispo: The patient is from: Home              Anticipated d/c is to: Home              Anticipated d/c date is: 24 hours              Patient currently is medically stable for discharge awaiting repeat paracentesis and transport  Consultants:   Nephrology, PCCM, GI  Procedures:   EGD,  dialysis  Antimicrobials:  Ceftriaxone x5 days, completed  Subjective: No acute issues or events overnight no further episodes of bleeding, denies nausea vomiting diarrhea constipation headache fevers or chills.  Objective: Vitals:   10/27/20 0400 10/27/20 0747 10/27/20 1010 10/27/20 1148  BP: 101/77 101/69 97/71 (!) 112/92  Pulse: 100 (!) 102 90 (!) 101  Resp: 14 17 20  (!) 21  Temp: 98 F (36.7 C) 97.6 F (36.4 C) 97.8 F (36.6 C) 97.9 F (36.6 C)  TempSrc: Oral Oral Oral Oral  SpO2: 97% 99% 98% 100%  Weight:      Height:        Intake/Output Summary (Last 24 hours) at 10/27/2020 1632 Last data filed at 10/26/2020 2300 Gross per 24 hour  Intake 250 ml  Output 75 ml  Net 175 ml   Filed Weights   10/25/20 0400 10/26/20 1238 10/26/20 1614  Weight: 79.9 kg 80.1 kg 80.1 kg    Examination:  General:  Pleasantly resting in bed, No acute distress. HEENT:  Normocephalic atraumatic.  Sclerae mildly icteric, noninjected.  Extraocular movements intact bilaterally. Neck:  Without mass or deformity.  Trachea is midline. Lungs:  Clear to auscultate bilaterally without rhonchi, wheeze, or rales. Heart:  Regular rate and rhythm.  Without murmurs, rubs, or gallops. Abdomen:  Soft, nontender, moderately distended.  Without guarding or rebound.  Rectal tube putting out dark brown stool Extremities: Without cyanosis, clubbing, edema, or obvious deformity. Vascular:  Dorsalis pedis and posterior tibial pulses palpable bilaterally. Skin:  Warm and dry, no erythema, mild jaundice   Data Reviewed: I have personally reviewed following labs and imaging studies  CBC: Recent Labs  Lab 10/21/20 1503 10/21/20 1533 10/22/20 2357 10/24/20 0354 10/24/20 1423 10/25/20 0152 10/26/20 0103 10/27/20 0057  WBC 10.8*   < > 8.5 8.7  --  8.2 7.3 7.3  NEUTROABS 9.0*  --   --   --   --   --   --   --   HGB 7.7*   < > 7.4* 6.8* 7.9* 8.1* 7.8* 7.8*  HCT 24.1*   < > 21.9* 20.3* 23.4* 23.8* 23.3*  23.5*  MCV 112.6*   < > 104.8* 106.8*  --  105.3* 105.9* 106.8*  PLT 126*   < > 100* 108*  --  103* 112* 91*   < > = values in this interval not displayed.   Basic Metabolic Panel: Recent Labs  Lab 10/22/20 1632 10/22/20 2357 10/24/20 0354 10/25/20 0152 10/26/20 0103 10/27/20 0057  NA  --  132* 135 134* 133* 132*  K  --  4.8 4.0 3.8 3.8 3.2*  CL  --  95* 99 98 98 97*  CO2  --  23 26 27 24 24   GLUCOSE  --  141* 154* 109* 116* 93  BUN  --  76* 46* 27* 41* 24*  CREATININE  --  5.85* 4.32* 3.21* 4.37* 3.18*  CALCIUM  --  8.8* 8.8* 8.8* 8.8* 8.5*  MG  --  2.4  --   --   --   --   PHOS 8.1* 7.5*  --   --   --   --    GFR: Estimated Creatinine Clearance: 20.6 mL/min (A) (by C-G formula based on SCr of 3.18 mg/dL (H)). Liver Function Tests: Recent Labs  Lab 10/21/20 1503 10/22/20 0047  AST 65* 71*  ALT 46* 55*  ALKPHOS 94 88  BILITOT 5.2* 7.2*  PROT 5.5* 6.0*  ALBUMIN 2.7* 2.9*   No results for input(s): LIPASE, AMYLASE in the last 168 hours. Recent Labs  Lab 10/21/20 1525  AMMONIA 83*   Coagulation Profile: Recent Labs  Lab 10/21/20 1503 10/22/20 0047  INR 2.0* 1.9*   Cardiac Enzymes: No results for input(s): CKTOTAL, CKMB, CKMBINDEX, TROPONINI in the last 168 hours. BNP (last 3 results) No results for input(s): PROBNP in the last 8760 hours. HbA1C: No results for input(s): HGBA1C in the last 72 hours. CBG: Recent Labs  Lab 10/24/20 2032  GLUCAP 92   Lipid Profile: No results for input(s): CHOL, HDL, LDLCALC, TRIG, CHOLHDL, LDLDIRECT in the last 72 hours. Thyroid Function Tests: No results for input(s): TSH, T4TOTAL, FREET4, T3FREE, THYROIDAB in the last 72 hours. Anemia Panel: No results for input(s): VITAMINB12, FOLATE, FERRITIN, TIBC, IRON, RETICCTPCT in the last 72 hours. Sepsis Labs: No results for input(s): PROCALCITON, LATICACIDVEN in the last 168 hours.  Recent Results (from the past 240 hour(s))  Resp Panel by RT-PCR (Flu A&B, Covid)  Nasopharyngeal Swab     Status: None   Collection Time: 10/21/20  4:51 PM   Specimen: Nasopharyngeal Swab; Nasopharyngeal(NP) swabs in vial transport medium  Result Value Ref Range Status   SARS Coronavirus 2 by RT PCR NEGATIVE NEGATIVE Final    Comment: (NOTE) SARS-CoV-2 target nucleic acids are NOT DETECTED.  The SARS-CoV-2 RNA is generally detectable in upper respiratory specimens during the acute phase of infection. The lowest concentration of SARS-CoV-2 viral copies this assay can detect is 138 copies/mL. A negative result does not preclude SARS-Cov-2 infection and should not be used as the sole basis for treatment or other patient management decisions. A negative result may occur with  improper specimen collection/handling, submission of specimen other than nasopharyngeal swab, presence of viral mutation(s) within the areas targeted by this assay, and inadequate number of viral copies(<138 copies/mL). A negative result must be combined with clinical observations, patient history, and epidemiological information. The expected result is Negative.  Fact Sheet for Patients:  EntrepreneurPulse.com.au  Fact Sheet for Healthcare Providers:  IncredibleEmployment.be  This test is no t yet approved or cleared by the [place] FDA and  has been authorized for detection and/or diagnosis of SARS-CoV-2 by FDA under an Emergency Use Authorization (EUA). This EUA will remain  in effect (meaning this test can be used) for the duration of the COVID-19 declaration under Section 564(b)(1) of the Act, 21 U.S.C.section 360bbb-3(b)(1), unless the authorization is terminated  or revoked sooner.       Influenza A by PCR NEGATIVE NEGATIVE Final   Influenza B by PCR NEGATIVE NEGATIVE Final    Comment: (NOTE) The Xpert Xpress SARS-CoV-2/FLU/RSV plus assay is intended as an aid in the diagnosis of influenza from Nasopharyngeal swab specimens and should not be  used as a sole basis for treatment. Nasal washings and aspirates are unacceptable for Xpert Xpress SARS-CoV-2/FLU/RSV testing.  Fact Sheet for Patients: EntrepreneurPulse.com.au  Fact  Sheet for Healthcare Providers: IncredibleEmployment.be  This test is not yet approved or cleared by the [place] and has been authorized for detection and/or diagnosis of SARS-CoV-2 by FDA under an Emergency Use Authorization (EUA). This EUA will remain in effect (meaning this test can be used) for the duration of the COVID-19 declaration under Section 564(b)(1) of the Act, 21 U.S.C. section 360bbb-3(b)(1), unless the authorization is terminated or revoked.  Performed at [facility], [place] [address], [place], [place] [address]      Radiology Studies: No results found. Scheduled Meds: . chlorhexidine  15 mL Mouth Rinse BID  . Chlorhexidine Gluconate Cloth  6 each Topical Q0600  . darbepoetin (ARANESP) injection - DIALYSIS  60 mcg Intravenous Q Wed-HD  . feeding supplement  1 Container Oral TID BM  . lactulose  20 g Oral BID  . mouth rinse  15 mL Mouth Rinse q12n4p  . midodrine  20 mg Oral TID WC  . multivitamin  1 tablet Oral QHS  . pantoprazole  40 mg Intravenous Q12H  . rifaximin  550 mg Oral BID  . sodium chloride flush  10-40 mL Intracatheter Q12H  . sodium chloride flush  3 mL Intravenous Q12H  . sucralfate  1 g Oral TID WC & HS   Continuous Infusions:    LOS: 6 days   Time spent: 84min  Patrina Andreas C Shivam Mestas, DO [facility]  If 7PM-7AM, please contact night-coverage [email or web address]  10/27/2020, 4:32 PM

## 2020-10-27 NOTE — Progress Notes (Signed)
Subjective: Sitting in bedside chair, no complaints awaiting paracentesis, said tolerated dialysis yesterday  Objective Vital signs in last 24 hours: Vitals:   10/26/20 2355 10/27/20 0400 10/27/20 0747 10/27/20 1010  BP: 92/62 101/77 101/69 97/71  Pulse: 61 100 (!) 102 90  Resp: 15 14 17 20   Temp:  98 F (36.7 C) 97.6 F (36.4 C) 97.8 F (36.6 C)  TempSrc: Oral Oral Oral Oral  SpO2: 98% 97% 99% 98%  Weight:      Height:       Weight change:   Physical Exam: General: Alert, pleasant appropriate NAD Heart: RRR no MRG Lungs: CTA nonlabored breathing Abdomen: Bowel sounds positive normoactive nontender distended with ascites Extremities: No pedal edema Dialysis Access: Right IJ PermCath dressing dry clear  Home meds: - lactulose 53ml tid/ rifaximin 550 bid - cardizem 30 tid - ensure enlive qid/ boost high prot tid - prn's/ vitamins/ supplements   OP HD:[facility] TTS 3.5h 300/600 77kg Hep none TDC - no esa  CXR 4/18 -IMPRESSION: No evidence of acute cardiopulmonary disease.   Problem/Plan: 1. GI bleed -acute, sp 1u prbc's 4/18. Hb 7.4 > 7.8 this a.m. EGD 4/19 + for duodenal ulcers likely source of bleeding. Also mild portal HTN gastropathy, grade II esophageal varices and early Barrett's esophagus changes.  2. 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 Taking lactulose and rifaximin.  3. Ascites - recurrent, now is getting weekly paracentesis as OP. Inpatient LVP per pmd. Noted 3 L paracentesis on 4/21 and for today also 4. Afib / RVR - per primary team, HR 90- 120, not on any rate lowering meds , prob due to low BP's.  5. ESRD - recent start to dialysis in early April. Poor candidate for CRRT. Cont HDTTS. HD tolerated yesterday BP stable. Will need BP's > 70- 75 in order to dialyze safely. If can't maintain BP's on HD will need hospice.   6. Hypotension/ vol- most likely due to cirrhosis physiology +/- anemia.  BP's a bit better on max-dose midodrine  20 tid. 2kg up by wts, no LE edema on exam, always has ascites.  7. Anemiackd+ abl - started darbe 60 ug weekly here (rec'd 4/20).  8. MBD ckd - Ca stable corrected equals 9.6,. Phos on recent admit was low in 3.3- 5.0 range. Repeat phos= 7.5 not on binders will need to start calcium acetate 1 with meals follow-up calcium ,with liver failure avoid binders that would be  toxic because of his liver failure 9. GOC - pt is DNR. Palliative care consultation appreciated. Overall prognosis is very poor.   Ernest Haber, PA-C [facility] Bee[facility] [phone] 10/27/2020,11:33 AM  LOS: 6 days   Labs: Basic Metabolic Panel: Recent Labs  Lab 10/22/20 1632 10/22/20 2357 10/24/20 0354 10/25/20 0152 10/26/20 0103 10/27/20 0057  NA  --  132*   < > 134* 133* 132*  K  --  4.8   < > 3.8 3.8 3.2*  CL  --  95*   < > 98 98 97*  CO2  --  23   < > 27 24 24   GLUCOSE  --  141*   < > 109* 116* 93  BUN  --  76*   < > 27* 41* 24*  CREATININE  --  5.85*   < > 3.21* 4.37* 3.18*  CALCIUM  --  8.8*   < > 8.8* 8.8* 8.5*  PHOS 8.1* 7.5*  --   --   --   --    < > =  values in this interval not displayed.   Liver Function Tests: Recent Labs  Lab 10/21/20 1503 10/22/20 0047  AST 65* 71*  ALT 46* 55*  ALKPHOS 94 88  BILITOT 5.2* 7.2*  PROT 5.5* 6.0*  ALBUMIN 2.7* 2.9*   No results for input(s): LIPASE, AMYLASE in the last 168 hours. Recent Labs  Lab 10/21/20 1525  AMMONIA 83*   CBC: Recent Labs  Lab 10/21/20 1503 10/21/20 1533 10/22/20 2357 10/24/20 0354 10/24/20 1423 10/25/20 0152 10/26/20 0103 10/27/20 0057  WBC 10.8*   < > 8.5 8.7  --  8.2 7.3 7.3  NEUTROABS 9.0*  --   --   --   --   --   --   --   HGB 7.7*   < > 7.4* 6.8*   < > 8.1* 7.8* 7.8*  HCT 24.1*   < > 21.9* 20.3*   < > 23.8* 23.3* 23.5*  MCV 112.6*   < > 104.8* 106.8*  --  105.3* 105.9* 106.8*  PLT 126*   < > 100* 108*  --  103* 112* 91*   < > = values in this interval not displayed.   Cardiac Enzymes: No results  for input(s): CKTOTAL, CKMB, CKMBINDEX, TROPONINI in the last 168 hours. CBG: Recent Labs  Lab 10/24/20 2032  GLUCAP 92    Studies/Results: No results found. Medications:  . chlorhexidine  15 mL Mouth Rinse BID  . Chlorhexidine Gluconate Cloth  6 each Topical Q0600  . darbepoetin (ARANESP) injection - DIALYSIS  60 mcg Intravenous Q Wed-HD  . feeding supplement  1 Container Oral TID BM  . lactulose  20 g Oral BID  . mouth rinse  15 mL Mouth Rinse q12n4p  . midodrine  20 mg Oral TID WC  . multivitamin  1 tablet Oral QHS  . pantoprazole  40 mg Intravenous Q12H  . rifaximin  550 mg Oral BID  . sodium chloride flush  10-40 mL Intracatheter Q12H  . sodium chloride flush  3 mL Intravenous Q12H  . sucralfate  1 g Oral TID WC & HS

## 2020-10-27 NOTE — Progress Notes (Signed)
   10/27/20 0747  Assess: MEWS Score  Temp 97.6 F (36.4 C)  BP 101/69  Pulse Rate (!) 102  ECG Heart Rate (!) 119  Resp 17  SpO2 99 %  Assess: MEWS Score  MEWS Temp 0  MEWS Systolic 0  MEWS Pulse 2  MEWS RR 0  MEWS LOC 0  MEWS Score 2  MEWS Score Color Yellow  Assess: if the MEWS score is Yellow or Red  Were vital signs taken at a resting state? Yes  Focused Assessment No change from prior assessment  Early Detection of Sepsis Score *See Row Information* Low  MEWS guidelines implemented *See Row Information* Yes  Treat  MEWS Interventions Other (Comment) (monitoring patient )  Pain Scale 0-10  Pain Score 0  Take Vital Signs  Increase Vital Sign Frequency  Yellow: Q 2hr X 2 then Q 4hr X 2, if remains yellow, continue Q 4hrs  Escalate  MEWS: Escalate Yellow: discuss with charge nurse/RN and consider discussing with provider and RRT  Notify: Charge Nurse/RN  Name of Charge Nurse/RN Notified Liana, RN  Date Charge Nurse/RN Notified 10/27/20  Time Charge Nurse/RN Notified 0750  Document  Progress note created (see row info) Yes

## 2020-10-27 NOTE — TOC Progression Note (Signed)
Transition of Care [facility]) - Progression Note    Patient Details  Name: Dillon Perkins MRN: [id number] Date of Birth: 17-Aug-1950  Transition of Care [facility]) CM/SW [place], [place] Phone Number: 10/27/2020, 1:11 PM  Clinical Narrative:    CSW met with pt and spouse at bedside.  CSW went assisted pt's spouse with filling out application for [facility].  CSW informed pt's spouse that application for [facility] will not be process by pt's discharge.  Pt's spouse is aware that pt will have to be transported to HD until [facility] has been accepted.  [facility] has been faxed.  TOC will continue to assist with disposition planning.        Expected Discharge Plan and Services                                                 Social Determinants of Health (SDOH) Interventions    Readmission Risk Interventions Readmission Risk Prevention Plan 10/11/2020  Transportation Screening Complete  Medication Review Press photographer) Complete  HRI or [place] Complete  SW Recovery Care/Counseling Consult Complete  Palliative Care Screening Complete  Skilled Nursing Facility Not Applicable  Some recent data might be hidden

## 2020-10-28 ENCOUNTER — Inpatient Hospital Stay: Payer: Medicare Other | Admitting: Nurse Practitioner

## 2020-10-28 ENCOUNTER — Inpatient Hospital Stay (HOSPITAL_COMMUNITY): Payer: Medicare Other

## 2020-10-28 ENCOUNTER — Telehealth: Payer: Self-pay | Admitting: Gastroenterology

## 2020-10-28 ENCOUNTER — Other Ambulatory Visit: Payer: Self-pay

## 2020-10-28 DIAGNOSIS — N186 End stage renal disease: Secondary | ICD-10-CM

## 2020-10-28 HISTORY — PX: IR PARACENTESIS: IMG2679

## 2020-10-28 LAB — BASIC METABOLIC PANEL
Anion gap: 13 (ref 5–15)
BUN: 38 mg/dL — ABNORMAL HIGH (ref 8–23)
CO2: 21 mmol/L — ABNORMAL LOW (ref 22–32)
Calcium: 8.8 mg/dL — ABNORMAL LOW (ref 8.9–10.3)
Chloride: 97 mmol/L — ABNORMAL LOW (ref 98–111)
Creatinine, Ser: 4.44 mg/dL — ABNORMAL HIGH (ref 0.61–1.24)
GFR, Estimated: 13 mL/min — ABNORMAL LOW (ref >60)
Glucose, Bld: 95 mg/dL (ref 70–99)
Potassium: 3.1 mmol/L — ABNORMAL LOW (ref 3.5–5.1)
Sodium: 131 mmol/L — ABNORMAL LOW (ref 135–145)

## 2020-10-28 LAB — CBC
HCT: 24.8 % — ABNORMAL LOW (ref 39.0–52.0)
Hemoglobin: 8.2 g/dL — ABNORMAL LOW (ref 13.0–17.0)
MCH: 35.5 pg — ABNORMAL HIGH (ref 26.0–34.0)
MCHC: 33.1 g/dL (ref 30.0–36.0)
MCV: 107.4 fL — ABNORMAL HIGH (ref 80.0–100.0)
Platelets: 90 10*3/uL — ABNORMAL LOW (ref 150–400)
RBC: 2.31 MIL/uL — ABNORMAL LOW (ref 4.22–5.81)
RDW: 23.1 % — ABNORMAL HIGH (ref 11.5–15.5)
WBC: 6.3 10*3/uL (ref 4.0–10.5)
nRBC: 0.8 % — ABNORMAL HIGH (ref 0.0–0.2)

## 2020-10-28 MED ORDER — RIFAXIMIN 550 MG PO TABS: 550.0000 mg | ORAL_TABLET | Freq: Two times a day (BID) | ORAL | 0 refills | Status: AC

## 2020-10-28 MED ORDER — PANTOPRAZOLE SODIUM 40 MG PO TBEC: 40.0000 mg | DELAYED_RELEASE_TABLET | Freq: Two times a day (BID) | ORAL | 1 refills | Status: AC

## 2020-10-28 MED ORDER — HYDROCORTISONE 1 % EX CREA: TOPICAL_CREAM | Freq: Three times a day (TID) | CUTANEOUS | 0 refills | Status: AC | PRN

## 2020-10-28 MED ORDER — LIDOCAINE HCL 1 % IJ SOLN
INTRAMUSCULAR | Status: AC
  Filled 2020-10-28: qty 20

## 2020-10-28 MED ORDER — SUCRALFATE 1 GM/10ML PO SUSP: 1.0000 g | Freq: Three times a day (TID) | ORAL | 0 refills | Status: DC

## 2020-10-28 MED ORDER — PANTOPRAZOLE SODIUM 40 MG PO TBEC: 40.0000 mg | DELAYED_RELEASE_TABLET | Freq: Every day | ORAL | 1 refills | Status: DC

## 2020-10-28 MED ORDER — MIDODRINE HCL 10 MG PO TABS: 20.0000 mg | ORAL_TABLET | Freq: Three times a day (TID) | ORAL | 1 refills | Status: AC

## 2020-10-28 MED ORDER — LACTULOSE 10 GM/15ML PO SOLN: 20.0000 g | Freq: Two times a day (BID) | ORAL | 0 refills | Status: DC

## 2020-10-28 MED ORDER — POTASSIUM CHLORIDE CRYS ER 20 MEQ PO TBCR
40.0000 meq | EXTENDED_RELEASE_TABLET | Freq: Once | ORAL | Status: AC
  Administered 2020-10-28: 40 meq via ORAL
  Filled 2020-10-28: qty 2

## 2020-10-28 MED ORDER — LIDOCAINE HCL (PF) 1 % IJ SOLN
INTRAMUSCULAR | Status: DC | PRN
  Administered 2020-10-28: 10 mL

## 2020-10-28 NOTE — TOC Transition Note (Addendum)
Transition of Care [facility]) - CM/SW Discharge Note   Patient Details  Name: Dillon Perkins MRN: [id number] Date of Birth: 1950-09-19  Transition of Care [facility]) CM/SW Contact:  Sharin Mons, RN Phone Number: 10/28/2020, 3:57 PM   Clinical Narrative:    Patient will DC to: home  Anticipated DC date: 10/28/2020 Family notified: yes Transport by: Corey Harold  Admitted with  C/o dizziness, GI BLEED. Hx of alcoholic cirrhosis/ portal hypertension, severe portal hypertensive gastropathy, esophageal varices, hepatocellular carcinoma, persistent atrial fibrillation, ESRD started on HD TTS 10/09/2019, chronic hypotension. From home with wife. Per MD patient ready for DC today . RN, patient, patient's family, and [facility] notified of DC. DME need noted, W/C. Referral made with [facility] for W/C. W/C will be delivered to pt 's home on tomorrow., wife made aware. Pt without Rx med concerns. Post hospital f/u noted on AVS.  [name] (Spouse)     [phone]       RNCM will sign off for now as intervention is no longer needed. Please consult [place] again if new needs arise.    Final next level of care: [place] Barriers to Discharge: No Barriers Identified   Patient Goals and CMS Choice        Discharge Placement                       Discharge Plan and Services                DME Arranged: Lightweight manual wheelchair with seat cushion DME Agency: [facility] Date DME Agency Contacted: 10/28/20 Time DME Agency Contacted: [phone] Representative spoke with at DME Agency: [place] Determinants of Health ([place]) Interventions     Readmission Risk Interventions Readmission Risk Prevention Plan 10/11/2020  Transportation Screening Complete  Medication Review Press photographer) Complete  HRI or [place] Complete  SW Recovery Care/Counseling Consult Complete  Palliative Care Screening Complete  [place] Not Applicable  Some recent data might be hidden

## 2020-10-28 NOTE — Progress Notes (Signed)
Physical Therapy Treatment Patient Details Name: Dillon Perkins MRN: [id number] DOB: 03-08-1951 Today's Date: 10/28/2020    History of Present Illness Dillon Perkins is a 70 y.o. 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    PT Comments    Pt reports needing to have BM, motivated to get into bathroom this session. Pt requiring several stand attempts prior to being able to ambulate to bathroom, given orthostasis and severe dizziness/tremors (see below). PT educated pt on UE exercise (UE punch ups, "raise the roof") to attempt to elevate BP, to always wait for dizziness s/s to pass prior to continuing mobility, to use an automatic BP cuff at home to monitor BP, and to have assist from family for all mobility. Pt ambulatory to and from bathroom without dizziness symptoms, performing mobility with supervision to min guard level of assist. PT recommending ted hose for home use to help maintain BP while upright. Plans to d/c today.   Follow Up Recommendations  Home health PT;Supervision/Assistance - 24 hour     Equipment Recommendations  Wheelchair (measurements PT);Wheelchair cushion (measurements PT)    Recommendations for Other Services       Precautions / Restrictions Precautions Precautions: Fall;Other (comment) Precaution Comments: watch HR and BP    Mobility  Bed Mobility Overal bed mobility: Needs Assistance Bed Mobility: Supine to Sit;Sit to Supine     Supine to sit: Supervision Sit to supine: Supervision   General bed mobility comments: for safety, increased time  and use of bedrails    Transfers Overall transfer level: Needs assistance Equipment used: Rolling walker (2 wheeled) Transfers: Sit to/from Stand Sit to Stand: Min guard         General transfer comment: for safety, STS x3 from EOB x2 and toilet x1. Pt with symptomatic hypotension during first stand, with BP/HR 82/61 and 108 bpm  Ambulation/Gait Ambulation/Gait assistance: Min guard Gait Distance (Feet): 10 Feet (x2) Assistive device: Rolling walker (2 wheeled) Gait Pattern/deviations: Step-through pattern;Decreased stride length;Trunk flexed Gait velocity: decr   General Gait Details: min guard for safety, verbal cuing for room navigation, monitoring for dizziness.   Stairs             Wheelchair Mobility    Modified Rankin (Stroke Patients Only)       Balance Overall balance assessment: Needs assistance   Sitting balance-Leahy Scale: Good       Standing balance-Leahy Scale: Poor Standing balance comment: need RW                            Cognition Arousal/Alertness: Awake/alert Behavior During Therapy: WFL for tasks assessed/performed Overall Cognitive Status: Within Functional Limits for tasks assessed                                 General Comments: pt does have some word finding dfificulty, delayed processing, pt aware      Exercises      General Comments        Pertinent Vitals/Pain Pain Assessment: No/denies pain Faces Pain Scale: No hurt  Home Living                      Prior Function            PT Goals (current goals can now be found in the care plan section) Acute Rehab PT Goals Patient Stated Goal: Get back home PT Goal Formulation: With patient/family Time For Goal Achievement: 11/09/20 Potential to Achieve Goals: Fair Progress towards PT goals: Progressing toward goals    Frequency    Min 3X/week      PT Plan Current plan remains appropriate    Co-evaluation               AM-PAC PT "6 Clicks" Mobility   Outcome Measure  Help needed turning from your back to your side while in a flat bed without using bedrails?: None Help needed moving from lying on your back to sitting on the side of a flat bed without using bedrails?: None Help needed moving to and from a bed to a chair (including a wheelchair)?: A Little Help needed standing up from a chair using your arms (e.g., wheelchair or bedside chair)?: A Little Help needed to walk in hospital room?: A Little Help needed climbing 3-5 steps with a railing? : A Little 6 Click Score: 20    End of Session   Activity Tolerance: Patient tolerated treatment well;Patient limited by fatigue Patient left: in chair;in bed;with bed alarm set Nurse Communication: Mobility status PT Visit Diagnosis: Other abnormalities of gait and mobility (R26.89);Muscle weakness (generalized) (M62.81)     Time: 6606-3016 PT Time Calculation (min) (ACUTE ONLY): 32 min  Charges:  $Gait Training: 8-22 mins $Therapeutic Activity: 8-22 mins                    Stacie Glaze, PT DPT Acute Rehabilitation Services Pager [phone]  Office [phone]   Louis Matte 10/28/2020, 2:34 PM

## 2020-10-28 NOTE — Telephone Encounter (Signed)
I spoke with in patient nurse Levada Dy, asking for patient to have follow up here twice a day for paracentesis.  Patient has dialysis on Tues, Thurs, Sat.  Levada Dy asked that I assist in arranging outpatient paracentesis, but she has no definite orders for schedule, max liters removed, or if needs albumin.  Patient will come in and see Alonza Bogus, PA on 4/29.  Patient not seen as an inpatient since 4/21.  Last orders from Dr. Silverio Decamp were to hold LVP due to profound hypotension. Appointment arranged with Alonza Bogus, PA for 4/29.  Earlier appointments available, but patient not available due to dialysis.

## 2020-10-28 NOTE — TOC Progression Note (Addendum)
Transition of Care [facility]) - Progression Note    Patient Details  Name: Dillon Perkins MRN: [id number] Date of Birth: [birth year]  Transition of Care [facility]) CM/SW [place], [place] Phone Number: 10/28/2020, 12:39 PM  Clinical Narrative:     CSW called pt's wife to confirm plan for Home with [facility]. RNCM will confirm pt is active with [facility] for palliative. Pt's wife confirms plan for him to return home. CSW to arrange first 3 days of [facility] transportation for HD TTS 10am -- 230. Wife informed that she will be responsible for arranging [facility] for HD and Paracentesis moving forward. Pt will ned [facility] transport home. Wife will be getting a ramp for the home.   CSW called and left message with [facility](pt is preapproved). Pt to d/c after Paracentesis today.   1334: CSW called [facility] and confirmed that transportation has been scheduled for HD on 4/26, 4/28, and 4/30  1530: CSW called [facility] for d/c home. CSW called pt wife and notified her that paracentesis OP has been scheduled but she would need to schedule transportation for those appointments. Wife is considering driving pt home herself since [facility] will be late; she will notify RN of her decision.   Expected Discharge Plan: [place] Barriers to Discharge: No Barriers Identified  Expected Discharge Plan and Services Expected Discharge Plan: [place]                                               Social Determinants of Health (SDOH) Interventions    Readmission Risk Interventions Readmission Risk Prevention Plan 10/11/2020  Transportation Screening Complete  Medication Review ([place]) Complete  HRI or [place] Complete  SW Recovery Care/Counseling Consult Complete  Palliative Care Screening Complete  [place] Not Applicable  Some recent data might be hidden

## 2020-10-28 NOTE — Telephone Encounter (Signed)
Levada Dy from [facility] called to speak with a nurse to schedule future Paracentesis appts for this patient prior to his discharge today. Please call [phone].

## 2020-10-28 NOTE — Progress Notes (Signed)
[name] to be D/C'd Home per MD order.  Discussed with the patient and all questions fully answered.  VSS, Skin clean, dry and intact without evidence of skin break down, no evidence of skin tears noted. IV catheter discontinued intact. Site without signs and symptoms of complications. Dressing and pressure applied.  An After Visit Summary was printed and given to the patient. Patient received prescription.  D/c education completed with patient/family including follow up instructions, medication list, d/c activities limitations if indicated, with other d/c instructions as indicated by MD - patient able to verbalize understanding, all questions fully answered.   Patient instructed to return to ED, call 911, or call MD for any changes in condition.   Patient escorted via [place], and D/C home via private auto.  Lillia Abed Herberta Pickron 10/28/2020 5:50 PM

## 2020-10-28 NOTE — Procedures (Signed)
PROCEDURE SUMMARY:  Successful image-guided paracentesis from the right lower abdomen.  Yielded 3.0 liters of clear yellow fluid which was the requested maximum. Residual peritoneal fluid remains on post procedure [place].  No immediate complications.  EBL: < 1 mL Patient tolerated well.   Specimen was not sent for labs.  Please see imaging section of Epic for full dictation.  Joaquim Nam PA-C 10/28/2020 3:06 PM

## 2020-10-28 NOTE — Discharge Summary (Addendum)
Physician Discharge Summary  Dillon Perkins:[id number] DOB: May 10, 1951 DOA: 10/21/2020  PCP: Antony Contras, MD  Admit date: 10/21/2020 Discharge date: 10/28/2020  Admitted From: Home Disposition:  Home  Discharge Condition:Stable CODE STATUS:DNR Diet recommendation: renal diet   Brief/Interim Summary:  Dillon Peer Hofmannis a 70 y.o.malewith medical history significant foralcoholic cirrhosis with portal hypertension, severe portal hypertensive gastropathy, and esophageal varices, hepatocellular carcinoma s/p radioembolization and immunotherapy with nivolumab, persistent atrial fibrillation not on anticoagulation due to risk of bleeding, ESRD started on HD TTS 10/09/2019, chronic hypotension on midodrine who presents to the ED for evaluation of dizziness andblood in hisstools - initially bright red blood and then burgundy colored stool. He had not seen any other obvious bleeding including epistaxis, hemoptysis, hematemesis, hematuria. He had not seen any swelling in his lower extremities. He reported adherence to dialysis with last treatment on 4/16. He receives dialysis through a dialysis catheter. Patient admitted as above with acute blood loss anemia profound hypotension in the setting of hepatocellular carcinoma, end-stage liver disease alcoholic cirrhosis and end-stage kidney failure on dialysis Tuesday Thursday Saturday.  Patient's anemia resolved, upper endoscopy was unremarkable for acute bleeding.  Patient's hemoglobin is now stabilized, he continues to be markedly hypotensive having difficulty initially with dialysis and recurrent paracentesis which he needs to maintain volume status.  Lengthy discussion was held daily with patient and palliative care given his tenuous circumstances and likely will have worsening condition over the next few weeks.  We discussed discharge home with hospice versus hospice house.  Patient does wish to return home but is not yet ready to give up on  dialysis or recurrent paracentesis.  At this point this means the patient will be transferred to dialysis on Tuesday Thursday Saturday with planned paracentesis on Monday and Friday meaning that 5 days of the week the patient will be requiring transport given his new ambulatory dysfunction and weakness he is unstable to transverse stairs in front of his house which makes transport family and his wife markedly limited.  He will have  final paracentesis on 10/28/2020 with ultimate discharge home and to set up transport for dialysis and biweekly paracentesis as described above.  Ultimately patient's blood pressure and symptom management with dialysis will likely be the major factor in whether or not he continues dialysis in the interim or will transition to hospice.  Extensive discussion was held with the wife about his poor prognosis, multiple comorbidities. She was agreeable for hospice and having the patient home with symptom care.  Patient however continued to wish for more aggressive treatment in the setting of dialysis and paracentesis.  We did discuss that should patient choose to move forward with hospice we could provide an abdominal drain for symptom management but this would not be a reasonable solution in the interim while he continues to seek somewhat aggressive care for his end-stage liver disease given his high risk for bleeding and infection.Palliative care was also following and recommended outpatient follow-up. Patient otherwise stable and agreeable for discharge home, patient being discharged to home after thoracentesis today.  Social worker helping for transportation for hemodialysis.  Patient very eager to go home today.  Following problems were addressed during his hospitalization:  Goals of care Palliative care/advanced care planning - Acute on chronic hypotension in the setting of ESRD and advanced alcoholic cirrhosis  -Palliative care and we had multiple discussions with patient and his  wife. -Given patient currently requires dialysis 3 times a week, will likely need paracentesis twice  per week he is looking at spending 5 days a week out of house and some sort of facility which we discussed was not consistent with our previous goals of care and improvement in quality of life as his renal and hepatic function continue to wane and his disease is progressed. -Patient continues to iterate that his goal is to come home which we discussed would be quite difficult given his need for transport 5 days a week. -Patient remains DNR, this decision was made previously in the setting of his advanced disease -Plan for outpatient follow-up with palliative care  Acute GI bleed with symptomatic anemia, stabilizing Alcoholic cirrhosis Hepatocellular carcinomas/p Y 90 and nivolumab Portal hypertension with esophageal varices: -Hemoglobin minimally downtrending without overt signs of bleeding -Previously completed ceftriaxone PPI drip -No recurrent bleeding thus no need for new transfusion, 2 units since admission total - MELDscore is 35 -poor prognostic indicator - Planned for twice weekly paracentesis in the near future - Continue rifaximin 550 mg twice daily - Vitamin K x3 days (completed0 - Continue lactulose, goal 3 bowel movements per day  Persistent atrial fibrillation with RVR: - Chronically difficult to control A. fib due to sensitivity to medications.  - Rate largely maintaining between 100-120 on admission, would not be too aggressive due to risk for hypotension.   Not on rate controlling her blood pressure medications due to hypotension. - Not on anticoagulation due to bleeding risk.   Atypical chest pain in the setting of profound hypotension, A. fib RVR, resolved - Chest pain resolved with resolution of hypotension - Troponin elevated to 4000, cardiology sidelined, evaluated November 2021, was not a candidate for cardiac cath at that time and he is arguably a much worse  candidate now.   ESRD on TTS HD: - Nephrology were following, appreciate insight and recommendations -Patient's tolerance of dialysis will likely dictate his ongoing care moving forward.  As he does not wish to quit dialysis for hospice.  He does continue to have profound hypotension and our concern is that if he cannot tolerate dialysis he will continue to decline quite rapidly  Discharge Diagnoses:  Principal Problem:   GI bleed Active Problems:   Atrial fibrillation with RVR ([facility])   Hepatocellular carcinoma ([facility])   ESRD on dialysis ([facility])   Alcoholic cirrhosis of liver with ascites ([facility])   Ascites   Acute GI bleeding    Discharge Instructions  Discharge Instructions    Diet - low sodium heart healthy   Complete by: As directed    Discharge instructions   Complete by: As directed    1)Please take prescribed medications as instructed 2)Follow up with your gastroenterologist as an outpatient.Follow up with radiology for paracentesis 3)Follow up with palliative care  as an outpatient 4)Follow up with nephrology for dialysis.Please undergo dialysis as scheduled 5)Follow up with your PCP in a week and do a CBC , BMP tests in a week   Increase activity slowly   Complete by: As directed      Allergies as of 10/28/2020      Reactions   Losartan Other (See Comments), Cough   Dizziness and possible cough   Losartan Potassium Other (See Comments), Cough   Dizziness and possible cough   Other Other (See Comments)   External lotions for itching- INEFFECTIVE   Rivaroxaban Other (See Comments)   Gross hematuria      Medication List    STOP taking these medications   diltiazem 30 MG tablet Commonly known as: Cardizem  TAKE these medications   acetaminophen 500 MG tablet Commonly known as: TYLENOL Take 500-1,000 mg by mouth See admin instructions. Take 500-1,000 mg by mouth at bedtime as needed for mild pain from itching and an additional 500 mg once a day as needed for  discomfort or headaches- max daily dosage of 1,500 mg from all sources   camphor-menthol lotion Commonly known as: SARNA Apply topically 2 (two) times daily.   feeding supplement Liqd Take 1 Container by mouth See admin instructions. Drink 1 container by mouth up to three times a day for supplementation What changed: Another medication with the same name was removed. Continue taking this medication, and follow the directions you see here.   feeding supplement Liqd Take 237 mLs by mouth 4 (four) times daily. What changed: Another medication with the same name was removed. Continue taking this medication, and follow the directions you see here.   hydrocortisone cream 1 % Apply topically 3 (three) times daily as needed for itching.   lactulose 10 GM/15ML solution Commonly known as: CHRONULAC Take 30 mLs (20 g total) by mouth 2 (two) times daily. What changed: when to take this   midodrine 10 MG tablet Commonly known as: PROAMATINE Take 2 tablets (20 mg total) by mouth 3 (three) times daily with meals. What changed:   how much to take  when to take this   multivitamin Tabs tablet Take 1 tablet by mouth at bedtime.   pantoprazole 40 MG tablet Commonly known as: Protonix Take 1 tablet (40 mg total) by mouth 2 (two) times daily.   rifaximin 550 MG Tabs tablet Commonly known as: XIFAXAN Take 1 tablet (550 mg total) by mouth 2 (two) times daily for 60 doses. Samples of this drug were given to the patient, quantity 12, Lot Number [id number] exp 04/26   sucralfate 1 GM/10ML suspension Commonly known as: CARAFATE Take 10 mLs (1 g total) by mouth 4 (four) times daily -  with meals and at bedtime.            Durable Medical Equipment  (From admission, onward)         Start     Ordered   10/28/20 1544  For home use only DME lightweight manual wheelchair with seat cushion  Once       Comments: Patient suffers from weakness, ESRD which impairs their ability to perform daily  activities like walking in the home.  A rolling walker will not resolve  issue with performing activities of daily living. A wheelchair will allow patient to safely perform daily activities. Patient is not able to propel themselves in the home using a standard weight wheelchair due to weakness. Patient can self propel in the lightweight wheelchair. Length of need 12 months Accessories: elevating leg rests (ELRs), wheel locks, extensions and anti-tippers.   10/28/20 1544          Follow-up Information    Antony Contras, MD. Schedule an appointment as soon as possible for a visit in 1 week(s).   Specialty: Family Medicine Contact information: [place] [address] [phone]        [facility]. Call on 11/01/2020.   Specialty: Gastroenterology Why: Hospital follow up scheduled for  11/01/2020 at 1:30 pm with Alonza Bogus PA to address paracentesis Contact information: [place] [address] [phone]             Allergies  Allergen Reactions  . Losartan Other (See  Comments) and Cough    Dizziness and possible cough   . Losartan Potassium Other (See Comments) and Cough    Dizziness and possible cough  . Other Other (See Comments)    External lotions for itching- INEFFECTIVE  . Rivaroxaban Other (See Comments)    Gross hematuria     Consultations:  Nephrology, palliative care   Procedures/Studies: CT Abdomen Pelvis Wo Contrast  Result Date: 10/09/2020 CLINICAL DATA:  Abdominal distension. 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 COMPARISON:  MRI from 07/04/2020 FINDINGS: Lower chest: Trace bilateral pleural effusions identified. Hepatobiliary: The liver appears cirrhotic. Treated tumor  within segment 2 is measures 5.3 x 4.3 cm on today's exam, image 17/3. This is compared with 5.0 x 4.6 cm previously. The other known liver lesions are difficult to assess reflecting lack of IV contrast material. Sludge and tiny stones identified within the dependent portion of the gallbladder. Pancreas: Unremarkable. No pancreatic ductal dilatation or surrounding inflammatory changes. Spleen: Normal in size without focal abnormality. Adrenals/Urinary Tract: Normal appearance of the adrenal glands. Small bilateral renal calculi identified. No hydronephrosis. Urinary bladder is unremarkable. Stomach/Bowel: The stomach appears nondistended. Small bowel loops are unremarkable. Mild diffuse colonic wall thickening is nonspecific in the setting of ascites and portal venous hypertension. Vascular/Lymphatic: Aortic atherosclerosis. No aneurysm. Previous GDA embolization. Esophageal and upper abdominal varices. No abdominopelvic adenopathy. Reproductive: Prostate is unremarkable. Other: Large volume of ascites within the abdomen and pelvis. A left inguinal hernia is identified which is filled with ascites as well as a nonobstructed loop of sigmoid colon. No discrete fluid collections identified. Musculoskeletal: Bilateral L5 pars defects identified. Mild anterolisthesis of L5 on S1 noted. IMPRESSION: 1. Large volume of ascites identified within the abdomen and pelvis. 2. Cirrhosis with known multifocal hepatocellular carcinoma. Status post Y 90 microsphere ablation. Dominant lesion in segment 2 appears similar to 07/04/2020 3.  Aortic Atherosclerosis (ICD10-I70.0). 4. Gallstones. 5. Trace pleural effusions. Electronically Signed   By: Kerby Moors M.D.   On: 10/09/2020 13:49   DG Chest 1 View  Result Date: 10/21/2020 CLINICAL DATA:  Fall, dizziness, low back pain EXAM: CHEST  1 VIEW COMPARISON:  10/09/2020 FINDINGS: Lungs are clear.  No pleural effusion or pneumothorax. The heart is top-normal in size. Right chest  dual lumen dialysis catheter. IMPRESSION: No evidence of acute cardiopulmonary disease. Electronically Signed   By: Julian Hy M.D.   On: 10/21/2020 15:54   CT Head Wo Contrast  Result Date: 10/09/2020 CLINICAL DATA:  Neurological deficit EXAM: CT HEAD WITHOUT CONTRAST TECHNIQUE: Contiguous axial images were obtained from the base of the skull through the vertex without intravenous contrast. COMPARISON:  Prior head CT 05/29/2020 FINDINGS: Brain: No evidence of acute infarction, hemorrhage, hydrocephalus, extra-axial collection or mass lesion/mass effect. Stable scattered foci of white matter hypoattenuation consistent with chronic microvascular ischemic white matter disease. Tiny bilateral cerebellar hemisphere lacunar infarcts are also unchanged. Mild cortical atrophy without interval progression. Vascular: No hyperdense vessel or unexpected calcification. Atherosclerotic vascular calcifications in the bilateral cavernous and supraclinoid carotid arteries. Skull: Normal. Negative for fracture or focal lesion. Sinuses/Orbits: No acute finding. Other: None. IMPRESSION: 1. No acute intracranial abnormality. 2. Stable cortical atrophy, chronic microvascular ischemic white matter disease and bilateral cerebellar hemisphere lacunar infarcts. Electronically Signed   By: Jacqulynn Cadet M.D.   On: 10/09/2020 11:00  MR LIVER W WO CONTRAST  Result Date: 10/13/2020 CLINICAL DATA:  70 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 Staging examination. Acute hepatic encephalopathy. EXAM: MRI ABDOMEN WITHOUT AND WITH CONTRAST TECHNIQUE: Multiplanar multisequence MR imaging of the abdomen was performed both before and after the administration of intravenous contrast. CONTRAST:  100mL GADAVIST GADOBUTROL 1 MMOL/ML IV SOLN COMPARISON:  Abdominal MRI 07/04/2020. FINDINGS: Lower chest: Small hiatal hernia.  Mild cardiomegaly. Hepatobiliary: Liver has a shrunken appearance and nodular contour, indicative of  advanced cirrhosis. Diffuse loss of signal intensity throughout the hepatic parenchyma on in phase dual echo images and diffuse low T2 signal intensity throughout the hepatic parenchyma, indicative of extensive iron deposition. Numerous T1 and T2 hypointense nodules scattered throughout the hepatic parenchyma, most of which are subcentimeter in size and do not enhance. In addition, there are 2 larger lesions which are T1 hyperintense and generally T2 hypointense, without enhancement measuring 5.4 x 4.4 cm in segment 2 (axial image 29 of series 10/1) which is stable compared to the prior examination, and 5.8 x 3.9 cm predominantly in segments 5 and 6 (axial image 42 of series 10/1) which is increased in size compared to the prior study (previously 5.3 x 3.2 cm). Both of these lesions demonstrates some degree of diffusion restriction. In segment 3 of the liver there is a T1 hypointense nonenhancing area (axial image 38 of series 1038) which corresponds to a previously noted lesion, likely a indicative of post embolization scarring. Additional lesion in segment 8 of the liver (axial image 34 of series 10/1) measuring 1.3 cm in diameter, stable in retrospect compared to the prior study, also mildly T1 hyperintense, T2 hypointense without definite enhancement on post gadolinium imaging. Other previously noted smaller lesions are not confidently identified on today's examination. No intra or extrahepatic biliary ductal dilatation. Pancreas: Multiple tiny T1 hypointense, T2 hyperintense, nonenhancing lesions are noted throughout the pancreas, largest of which is in the tail of the pancreas measuring only 11 mm, similar in size and number to the prior examination, favored to represent small benign lesions such as pancreatic pseudocysts or side branch IPMNs (intraductal papillary mucinous neoplasms). No definite communication with the main pancreatic duct noted. No peripancreatic fluid collections. Spleen: Spleen is mildly  enlarged measuring 11.1 x 6.9 x 10.9 cm (estimated splenic volume of 417 mL) . Adrenals/Urinary Tract: bilateral kidneys and adrenal glands are normal in appearance. No hydroureteronephrosis in the visualized portions of the abdomen. Stomach/Bowel: Visualized portions are unremarkable. Vascular/Lymphatic: Aortic atherosclerosis, without evidence of aneurysm in the visualized abdominal vasculature. Portal vein is patent and normal in caliber. No lymphadenopathy noted in the abdomen. Other:  Moderate to large volume of ascites. Musculoskeletal: No aggressive appearing osseous lesions are noted in the visualized portions of the skeleton. IMPRESSION: 1. 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 Many of the previously noted suspicious lesions have regressed or disappeared following prior embolization procedure. The other lesions which remain are generally stable compared to the prior study, with the exception of the lesion in the right lobe of the liver between segments 5 and 6 which is slightly larger than the prior study, but demonstrates no definitive internal enhancement. Continued attention on follow-up studies is recommended to ensure stability or regression. 2. Moderate to large volume of ascites. 3. Multiple small cystic lesions scattered throughout the pancreas measuring 1.1 cm in size or less, stable compared to prior studies, likely to represent benign lesions as discussed above. Attention at  time of routine follow-up is recommended to ensure continued stability. 4. Small hiatal hernia. 5. Mild cardiomegaly. Electronically Signed   By: Vinnie Langton M.D.   On: 10/13/2020 09:57   DG Chest Port 1 View  Result Date: 10/09/2020 CLINICAL DATA:  Altered mental status. History of hepatocellular carcinoma EXAM: PORTABLE CHEST 1 VIEW COMPARISON:  May 22, 2020. FINDINGS: Note that a small amount of the lateral left base not included. Central  catheter tip is in the right atrium. No pneumothorax. There is no edema or airspace opacity. Heart is mildly enlarged with pulmonary vascularity normal. No adenopathy. There is aortic atherosclerosis. No bone lesions. A focus of calcification is noted in the left carotid artery. IMPRESSION: Note that a small portion of the lateral left base not included. Visualized lungs clear. Stable cardiac prominence. Central catheter tip in right atrium without pneumothorax. Aortic Atherosclerosis (ICD10-I70.0). Focus of calcification noted in left carotid artery. Electronically Signed   By: Lowella Grip III M.D.   On: 10/09/2020 09:36   US LIVER DOPPLER  Result Date: 10/10/2020 CLINICAL DATA:  Cirrhosis of liver with ascites EXAM: DUPLEX ULTRASOUND OF LIVER TECHNIQUE: Color and duplex Doppler ultrasound was performed to evaluate the hepatic in-flow and out-flow vessels. COMPARISON:  CT abdomen/pelvis yesterday FINDINGS: Liver: Diffusely nodular liver contour. The liver appears small in the parenchyma is diffusely heterogeneous and echogenic. Hypoechoic soft tissue mass in the left upper lobe measures 4.8 x 4.4 x 5.0 cm. Hypoechoic solid mass in the right hepatic lobe measures approximately 4.9 x 5.7 x 8.3 cm. Diffusely thickened gallbladder wall. Sludge is present within the gallbladder lumen. The common bile duct is normal at 3 mm. Main Portal Vein size: 1.2 cm Portal Vein Velocities Main Prox:  41 cm/sec with hepatopetal directional flow. Main Mid: 55 cm/sec with hepatopetal directional flow. Main Dist:  71 cm/sec with hepatopetal directional flow. Right: Unable to visualize Left: 28 cm/sec with hepatopetal directional flow. Hepatic Vein Velocities Right:  27 cm/sec Middle:  32 cm/sec Left:  68 cm/sec IVC: Present and patent with normal respiratory phasicity. Hepatic Artery Velocity:  109 cm/sec Splenic Vein Velocity:  34 cm/sec Spleen: 8.4 cm x 12.0 cm x 8.2 cm with a total volume of 436 cm^3 (411 cm^3 is upper limit  normal) Portal Vein Occlusion/Thrombus: No Splenic Vein Occlusion/Thrombus: No Ascites: None Varices: Moderate ascites. IMPRESSION: 1. 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 2. Portal veins remain patent with hepatopetal directional flow. 3. Previously treated left hepatic mass measures approximately 4.8 x 4.4 x 5.0 cm. Viability of the lesion cannot be assessed with ultrasound. 4. Right hepatic mass measuring approximately 4.9 x 5.7 x 8.3 cm not identified on prior imaging. Recommend further evaluation with gadolinium-enhanced MRI of the abdomen when the patient's clinical status is improved, preferably as an outpatient to facilitate optimal imaging quality and diagnostic sensitivity. Electronically Signed   By: Jacqulynn Cadet M.D.   On: 10/10/2020 07:45   IR Paracentesis  Result Date: 10/28/2020 INDICATION: Patient with history of end-stage renal disease on hemodialysis, hepatocellular carcinoma, alcoholic cirrhosis, recurrent ascites. Request to IR for therapeutic paracentesis with 3 L EXAM: ULTRASOUND GUIDED THERAPEUTIC PARACENTESIS MEDICATIONS: 8 mL 1% lidocaine COMPLICATIONS: None immediate. PROCEDURE: Informed written consent was obtained from the patient after a discussion of the risks, benefits and alternatives to treatment. A timeout was performed prior to the initiation of the procedure. Initial ultrasound scanning demonstrates a large amount of ascites within the right lower abdominal quadrant. The  right lower abdomen was prepped and draped in the usual sterile fashion. 1% lidocaine was used for local anesthesia. Following this, a 19 gauge, 7-cm, Yueh catheter was introduced. An ultrasound image was saved for documentation purposes. The paracentesis was performed. The catheter was removed and a dressing was applied. The patient tolerated the procedure well without immediate post procedural complication. FINDINGS: A total of approximately 3.0 L of clear  yellow fluid was removed. IMPRESSION: Successful ultrasound-guided paracentesis yielding 3.0 liters of peritoneal fluid. Read by Candiss Norse, PA-C Electronically Signed   By: Miachel Roux M.D.   On: 10/28/2020 15:08   IR Paracentesis  Result Date: 10/24/2020 INDICATION: Patient with history of hepatocellular carcinoma, end-stage renal disease, alcoholic cirrhosis, recurrent ascites. Request received for therapeutic paracentesis. EXAM: ULTRASOUND GUIDED THERAPEUTIC PARACENTESIS MEDICATIONS: 1% lidocaine to skin and subcutaneous tissue COMPLICATIONS: None immediate. PROCEDURE: Informed written consent was obtained from the patient after a discussion of the risks, benefits and alternatives to treatment. A timeout was performed prior to the initiation of the procedure. Initial ultrasound scanning demonstrates a large amount of ascites within the right upper to mid abdominal quadrant. The right upper to mid abdomen was prepped and draped in the usual sterile fashion. 1% lidocaine was used for local anesthesia. Following this, a 19 gauge, 7-cm, Yueh catheter was introduced. An ultrasound image was saved for documentation purposes. The paracentesis was performed. The catheter was removed and a dressing was applied. The patient tolerated the procedure well without immediate post procedural complication. FINDINGS: A total of approximately 3.3 liters of yellow fluid was removed. IMPRESSION: Successful ultrasound-guided therapeutic paracentesis yielding 3.3 liters of peritoneal fluid. Read by: Rowe Robert, PA-C Electronically Signed   By: Markus Daft M.D.   On: 10/24/2020 16:37   IR Paracentesis  Result Date: 10/14/2020 INDICATION: Patient with a history of hepatocellular carcinoma and alcoholic cirrhosis presents today with ascites. Interventional Radiology asked to perform a therapeutic paracentesis. EXAM: ULTRASOUND GUIDED PARACENTESIS MEDICATIONS: 1% lidocaine 10 mL COMPLICATIONS: None immediate. PROCEDURE:  Informed written consent was obtained from the patient after a discussion of the risks, benefits and alternatives to treatment. A timeout was performed prior to the initiation of the procedure. Initial ultrasound scanning demonstrates a large amount of ascites within the left lower abdominal quadrant. The left lower abdomen was prepped and draped in the usual sterile fashion. 1% lidocaine was used for local anesthesia. Following this, a 19 gauge, 7-cm, Yueh catheter was introduced. An ultrasound image was saved for documentation purposes. The paracentesis was performed. The catheter was removed and a dressing was applied. The patient tolerated the procedure well without immediate post procedural complication. FINDINGS: A total of approximately 4.6 of clear yellow fluid was removed. IMPRESSION: Successful ultrasound-guided paracentesis yielding 4.6 liters of peritoneal fluid. Read by: Soyla Dryer, NP Electronically Signed   By: Corrie Mckusick D.O.   On: 10/14/2020 13:02   IR Paracentesis  Result Date: 10/10/2020 INDICATION: History of multifocal hepatocellular cancer. Recurrent ascites. Request for diagnostic and therapeutic paracentesis. EXAM: ULTRASOUND GUIDED LEFT LOWER QUADRANT PARACENTESIS MEDICATIONS: 1% plain lidocaine, 5 mL COMPLICATIONS: None immediate. PROCEDURE: Informed written consent was obtained from the patient after a discussion of the risks, benefits and alternatives to treatment. A timeout was performed prior to the initiation of the procedure. Initial ultrasound scanning demonstrates a large amount of ascites within the left lower abdominal quadrant. The left lower abdomen was prepped and draped in the usual sterile fashion. 1% lidocaine was used for local anesthesia. Following this,  a 19 gauge, 7-cm, Yueh catheter was introduced. An ultrasound image was saved for documentation purposes. The paracentesis was performed. The catheter was removed and a dressing was applied. The patient  tolerated the procedure well without immediate post procedural complication. FINDINGS: A total of approximately 5.8 L of clear yellow fluid was removed. Samples were sent to the laboratory as requested by the clinical team. IMPRESSION: Successful ultrasound-guided paracentesis yielding 5.8 liters of peritoneal fluid. Read by: Ascencion Dike PA-C Electronically Signed   By: Sandi Mariscal M.D.   On: 10/10/2020 15:40   IR Paracentesis  Result Date: 10/07/2020 INDICATION: Patient with history of cirrhosis, recurrent ascites. Request is made for therapeutic paracentesis of up to 3 L maximum. EXAM: ULTRASOUND GUIDED THERAPEUTIC PARACENTESIS MEDICATIONS: 10 mL 1% lidocaine COMPLICATIONS: None immediate. PROCEDURE: Informed written consent was obtained from the patient after a discussion of the risks, benefits and alternatives to treatment. A timeout was performed prior to the initiation of the procedure. Initial ultrasound scanning demonstrates a large amount of ascites within the right lower abdominal quadrant. The right lower abdomen was prepped and draped in the usual sterile fashion. 1% lidocaine was used for local anesthesia. Following this, a 19 gauge, 7-cm, Yueh catheter was introduced. An ultrasound image was saved for documentation purposes. The paracentesis was performed. The catheter was removed and a dressing was applied. The patient tolerated the procedure well without immediate post procedural complication. FINDINGS: A total of approximately 3.0 liters of yellow fluid was removed. IMPRESSION: Successful ultrasound-guided therapeutic paracentesis yielding 3.0 liters of peritoneal fluid. Read by: Brynda Greathouse PA-C Electronically Signed   By: Aletta Edouard M.D.   On: 10/07/2020 13:19      Subjective: Patient seen and examined at the bedside this morning.  Overall comfortable during my evaluation, heart rate fluctuating from 100-120, which he says is chronic.  Blood pressure was overall stable.  He has  been planned for paracentesis today before discharge.  He says that his goal is to go home today.  Discharge Exam: Vitals:   10/28/20 1213 10/28/20 1541  BP: 108/80 104/76  Pulse: (!) 104 90  Resp: 15 12  Temp: 97.6 F (36.4 C) 97.7 F (36.5 C)  SpO2: 97% 98%   Vitals:   10/28/20 0903 10/28/20 1150 10/28/20 1213 10/28/20 1541  BP: 100/64 106/76 108/80 104/76  Pulse: 100 99 (!) 104 90  Resp: 16 16 15 12   Temp:   97.6 F (36.4 C) 97.7 F (36.5 C)  TempSrc:   Oral Oral  SpO2: 100% 100% 97% 98%  Weight:      Height:        General: Pt is alert, awake, not in acute distress Cardiovascular: Sinus tachycardia, no rubs, no gallops,  dialysis catheter on the right chest Respiratory: CTA bilaterally, no wheezing, no rhonchi Abdominal: Distended, bowel sounds present, ascites Extremities: no edema, no cyanosis    The results of significant diagnostics from this hospitalization (including imaging, microbiology, ancillary and laboratory) are listed below for reference.     Microbiology: Recent Results (from the past 240 hour(s))  Resp Panel by RT-PCR (Flu A&B, Covid) Nasopharyngeal Swab     Status: None   Collection Time: 10/21/20  4:51 PM   Specimen: Nasopharyngeal Swab; Nasopharyngeal(NP) swabs in vial transport medium  Result Value Ref Range Status   SARS Coronavirus 2 by RT PCR NEGATIVE NEGATIVE Final    Comment: (NOTE) SARS-CoV-2 target nucleic acids are NOT DETECTED.  The SARS-CoV-2 RNA is generally detectable in  upper respiratory specimens during the acute phase of infection. The lowest concentration of SARS-CoV-2 viral copies this assay can detect is 138 copies/mL. A negative result does not preclude SARS-Cov-2 infection and should not be used as the sole basis for treatment or other patient management decisions. A negative result may occur with  improper specimen collection/handling, submission of specimen other than nasopharyngeal swab, presence of viral  mutation(s) within the areas targeted by this assay, and inadequate number of viral copies(<138 copies/mL). A negative result must be combined with clinical observations, patient history, and epidemiological information. The expected result is Negative.  Fact Sheet for Patients:  EntrepreneurPulse.com.au  Fact Sheet for Healthcare Providers:  IncredibleEmployment.be  This test is no t yet approved or cleared by the [place] FDA and  has been authorized for detection and/or diagnosis of SARS-CoV-2 by FDA under an Emergency Use Authorization (EUA). This EUA will remain  in effect (meaning this test can be used) for the duration of the COVID-19 declaration under Section 564(b)(1) of the Act, 21 U.S.C.section 360bbb-3(b)(1), unless the authorization is terminated  or revoked sooner.       Influenza A by PCR NEGATIVE NEGATIVE Final   Influenza B by PCR NEGATIVE NEGATIVE Final    Comment: (NOTE) The Xpert Xpress SARS-CoV-2/FLU/RSV plus assay is intended as an aid in the diagnosis of influenza from Nasopharyngeal swab specimens and should not be used as a sole basis for treatment. Nasal washings and aspirates are unacceptable for Xpert Xpress SARS-CoV-2/FLU/RSV testing.  Fact Sheet for Patients: EntrepreneurPulse.com.au  Fact Sheet for Healthcare Providers: IncredibleEmployment.be  This test is not yet approved or cleared by the [place] FDA and has been authorized for detection and/or diagnosis of SARS-CoV-2 by FDA under an Emergency Use Authorization (EUA). This EUA will remain in effect (meaning this test can be used) for the duration of the COVID-19 declaration under Section 564(b)(1) of the Act, 21 U.S.C. section 360bbb-3(b)(1), unless the authorization is terminated or revoked.  Performed at [facility], [place] [address] [place], [place] [address]      Labs: BNP (last 3  results) Recent Labs    02/27/20 0827 07/04/20 1010  BNP 326.6* 4,742.5*   Basic Metabolic Panel: Recent Labs  Lab 10/22/20 1632 10/22/20 2357 10/24/20 0354 10/25/20 0152 10/26/20 0103 10/27/20 0057 10/28/20 0126  NA  --  132* 135 134* 133* 132* 131*  K  --  4.8 4.0 3.8 3.8 3.2* 3.1*  CL  --  95* 99 98 98 97* 97*  CO2  --  23 26 27 24 24  21*  GLUCOSE  --  141* 154* 109* 116* 93 95  BUN  --  76* 46* 27* 41* 24* 38*  CREATININE  --  5.85* 4.32* 3.21* 4.37* 3.18* 4.44*  CALCIUM  --  8.8* 8.8* 8.8* 8.8* 8.5* 8.8*  MG  --  2.4  --   --   --   --   --   PHOS 8.1* 7.5*  --   --   --   --   --    Liver Function Tests: Recent Labs  Lab 10/22/20 0047  AST 71*  ALT 55*  ALKPHOS 88  BILITOT 7.2*  PROT 6.0*  ALBUMIN 2.9*   No results for input(s): LIPASE, AMYLASE in the last 168 hours. No results for input(s): AMMONIA in the last 168 hours. CBC: Recent Labs  Lab 10/24/20 0354 10/24/20 1423 10/25/20 0152 10/26/20 0103 10/27/20 0057 10/28/20 0126  WBC 8.7  --  8.2 7.3 7.3 6.3  HGB 6.8* 7.9* 8.1* 7.8* 7.8* 8.2*  HCT 20.3* 23.4* 23.8* 23.3* 23.5* 24.8*  MCV 106.8*  --  105.3* 105.9* 106.8* 107.4*  PLT 108*  --  103* 112* 91* 90*   Cardiac Enzymes: No results for input(s): CKTOTAL, CKMB, CKMBINDEX, TROPONINI in the last 168 hours. BNP: Invalid input(s): POCBNP CBG: Recent Labs  Lab 10/24/20 2032  GLUCAP 92   D-Dimer No results for input(s): DDIMER in the last 72 hours. Hgb A1c No results for input(s): HGBA1C in the last 72 hours. Lipid Profile No results for input(s): CHOL, HDL, LDLCALC, TRIG, CHOLHDL, LDLDIRECT in the last 72 hours. Thyroid function studies No results for input(s): TSH, T4TOTAL, T3FREE, THYROIDAB in the last 72 hours.  Invalid input(s): FREET3 Anemia work up No results for input(s): VITAMINB12, FOLATE, FERRITIN, TIBC, IRON, RETICCTPCT in the last 72 hours. Urinalysis    Component Value Date/Time   COLORURINE AMBER (A) 10/09/2020 1153    APPEARANCEUR CLEAR 10/09/2020 1153   LABSPEC 1.016 10/09/2020 1153   PHURINE 5.0 10/09/2020 1153   GLUCOSEU NEGATIVE 10/09/2020 1153   HGBUR NEGATIVE 10/09/2020 1153   BILIRUBINUR NEGATIVE 10/09/2020 1153   KETONESUR NEGATIVE 10/09/2020 1153   PROTEINUR NEGATIVE 10/09/2020 1153   UROBILINOGEN 1.0 07/23/2014 0402   NITRITE NEGATIVE 10/09/2020 1153   LEUKOCYTESUR SMALL (A) 10/09/2020 1153   Sepsis Labs Invalid input(s): PROCALCITONIN,  WBC,  LACTICIDVEN Microbiology Recent Results (from the past 240 hour(s))  Resp Panel by RT-PCR (Flu A&B, Covid) Nasopharyngeal Swab     Status: None   Collection Time: 10/21/20  4:51 PM   Specimen: Nasopharyngeal Swab; Nasopharyngeal(NP) swabs in vial transport medium  Result Value Ref Range Status   SARS Coronavirus 2 by RT PCR NEGATIVE NEGATIVE Final    Comment: (NOTE) SARS-CoV-2 target nucleic acids are NOT DETECTED.  The SARS-CoV-2 RNA is generally detectable in upper respiratory specimens during the acute phase of infection. The lowest concentration of SARS-CoV-2 viral copies this assay can detect is 138 copies/mL. A negative result does not preclude SARS-Cov-2 infection and should not be used as the sole basis for treatment or other patient management decisions. A negative result may occur with  improper specimen collection/handling, submission of specimen other than nasopharyngeal swab, presence of viral mutation(s) within the areas targeted by this assay, and inadequate number of viral copies(<138 copies/mL). A negative result must be combined with clinical observations, patient history, and epidemiological information. The expected result is Negative.  Fact Sheet for Patients:  EntrepreneurPulse.com.au  Fact Sheet for Healthcare Providers:  IncredibleEmployment.be  This test is no t yet approved or cleared by the [place] FDA and  has been authorized for detection and/or diagnosis of SARS-CoV-2  by FDA under an Emergency Use Authorization (EUA). This EUA will remain  in effect (meaning this test can be used) for the duration of the COVID-19 declaration under Section 564(b)(1) of the Act, 21 U.S.C.section 360bbb-3(b)(1), unless the authorization is terminated  or revoked sooner.       Influenza A by PCR NEGATIVE NEGATIVE Final   Influenza B by PCR NEGATIVE NEGATIVE Final    Comment: (NOTE) The Xpert Xpress SARS-CoV-2/FLU/RSV plus assay is intended as an aid in the diagnosis of influenza from Nasopharyngeal swab specimens and should not be used as a sole basis for treatment. Nasal washings and aspirates are unacceptable for Xpert Xpress SARS-CoV-2/FLU/RSV testing.  Fact Sheet for Patients: EntrepreneurPulse.com.au  Fact Sheet for Healthcare Providers: IncredibleEmployment.be  This test is not  yet approved or cleared by the [place] and has been authorized for detection and/or diagnosis of SARS-CoV-2 by FDA under an Emergency Use Authorization (EUA). This EUA will remain in effect (meaning this test can be used) for the duration of the COVID-19 declaration under Section 564(b)(1) of the Act, 21 U.S.C. section 360bbb-3(b)(1), unless the authorization is terminated or revoked.  Performed at [facility], [place] [address], [place], [place] [address]     Please note: You were cared for by a hospitalist during your hospital stay. Once you are discharged, your primary care physician will handle any further medical issues. Please note that NO REFILLS for any discharge medications will be authorized once you are discharged, as it is imperative that you return to your primary care physician (or establish a relationship with a primary care physician if you do not have one) for your post hospital discharge needs so that they can reassess your need for medications and monitor your lab values.    Time coordinating discharge: 40  minutes  SIGNED:   Shelly Coss, MD  [facility] 10/28/2020, 3:59 PM Pager [phone]  If 7PM-7AM, please contact night-coverage [email or web address] Password [email or web address]

## 2020-10-28 NOTE — Consult Note (Signed)
   [facility] Inpatient Consult   10/28/2020  Dillon Perkins 1951/03/14 [id number]  [facility] [AC[facility]] Patient: Medicare CMS DCE  Primary Care Provider: Antony Contras, MD, [facility] Physician, is listed to do the Transition of Care  Patient screened for hospitalization with noted high risk score for unplanned readmission risk and to assess for potential [facility] service needs for post hospital transition.  Review of patient's medical record reveals patient had been referred to  [facility] program but was not opened. 10/29/20 Spoke with [facility] today and confirmed patient was never enrolled due to program changes.  Plan:  Patient's primary care provider does the transition of care follow up calls and appointments.  Patient is home with [facility]. 10/29/20 Will follow up with wife for any additional needs since transitioning home yesterday.  For questions contact:   Natividad Brood, RN BSN [facility] Liaison  [phone] business mobile phone Toll free office [phone]  Fax number: [phone] [name]

## 2020-10-28 NOTE — Progress Notes (Signed)
[facility] Progress Note   Subjective:   Pt seen in room, eager to discharge. Planning for paracentesis today. Denies SOB, CP, palpitations and dizziness  Objective Vitals:   10/27/20 2305 10/28/20 0305 10/28/20 0803 10/28/20 0903  BP: 94/69 108/86  100/64  Pulse: 88 97 (!) 118 100  Resp: 15 17  16   Temp: 97.7 F (36.5 C) 97.7 F (36.5 C) 97.8 F (36.6 C)   TempSrc: Oral Oral Oral   SpO2: 99% 98%  100%  Weight:      Height:       Physical Exam General: well developed male, alert and in NAD Heart: tachycardic, regular rhythm. No murmr Lungs: Respirations unlabored. Lungs CTA without wheezing or rales Abdomen: Normoactive bowel sounds, +ascites Extremities: No edema b/l lower extremities Dialysis Access:  R [facility] with clean dry dressing  Additional Objective Labs: Basic Metabolic Panel: Recent Labs  Lab 10/22/20 1632 10/22/20 2357 10/24/20 0354 10/26/20 0103 10/27/20 0057 10/28/20 0126  NA  --  132*   < > 133* 132* 131*  K  --  4.8   < > 3.8 3.2* 3.1*  CL  --  95*   < > 98 97* 97*  CO2  --  23   < > 24 24 21*  GLUCOSE  --  141*   < > 116* 93 95  BUN  --  76*   < > 41* 24* 38*  CREATININE  --  5.85*   < > 4.37* 3.18* 4.44*  CALCIUM  --  8.8*   < > 8.8* 8.5* 8.8*  PHOS 8.1* 7.5*  --   --   --   --    < > = values in this interval not displayed.   Liver Function Tests: Recent Labs  Lab 10/21/20 1503 10/22/20 0047  AST 65* 71*  ALT 46* 55*  ALKPHOS 94 88  BILITOT 5.2* 7.2*  PROT 5.5* 6.0*  ALBUMIN 2.7* 2.9*   No results for input(s): LIPASE, AMYLASE in the last 168 hours. CBC: Recent Labs  Lab 10/21/20 1503 10/21/20 1533 10/24/20 0354 10/24/20 1423 10/25/20 0152 10/26/20 0103 10/27/20 0057 10/28/20 0126  WBC 10.8*   < > 8.7  --  8.2 7.3 7.3 6.3  NEUTROABS 9.0*  --   --   --   --   --   --   --   HGB 7.7*   < > 6.8*   < > 8.1* 7.8* 7.8* 8.2*  HCT 24.1*   < > 20.3*   < > 23.8* 23.3* 23.5* 24.8*  MCV 112.6*   < > 106.8*  --   105.3* 105.9* 106.8* 107.4*  PLT 126*   < > 108*  --  103* 112* 91* 90*   < > = values in this interval not displayed.   Blood Culture    Component Value Date/Time   SDES FLUID ABDOMEN PERITONEAL 10/10/2020 1510   SDES FLUID PERITONEAL ABDOMEN 10/10/2020 1510   SPECREQUEST NONE 10/10/2020 1510   SPECREQUEST NONE 10/10/2020 1510   CULT  10/10/2020 1510    NO GROWTH 5 DAYS Performed at [facility], [place] [address], [place], [place] [address]    REPTSTATUS 10/11/2020 FINAL 10/10/2020 1510   REPTSTATUS 10/15/2020 FINAL 10/10/2020 1510   CBG: Recent Labs  Lab 10/24/20 2032  GLUCAP 92   Medications:  . chlorhexidine  15 mL Mouth Rinse BID  . Chlorhexidine Gluconate Cloth  6 each Topical Q0600  . darbepoetin (  ARANESP) injection - DIALYSIS  60 mcg Intravenous Q Wed-HD  . feeding supplement  1 Container Oral TID BM  . lactulose  20 g Oral BID  . mouth rinse  15 mL Mouth Rinse q12n4p  . midodrine  20 mg Oral TID WC  . multivitamin  1 tablet Oral QHS  . pantoprazole  40 mg Intravenous Q12H  . potassium chloride  40 mEq Oral Once  . rifaximin  550 mg Oral BID  . sodium chloride flush  10-40 mL Intracatheter Q12H  . sodium chloride flush  3 mL Intravenous Q12H  . sucralfate  1 g Oral TID WC & HS    Dialysis Orders: [facility] TTS 3.5h 300/600 77kg Hep none TDC - no esa  CXR 4/18 -IMPRESSION: No evidence of acute cardiopulmonary disease.  Assessment/Plan:  1. GI bleed -acute, sp 1u prbc's 4/18. Hb 7.4 > 7.8 this a.m. EGD 4/19 + for duodenal ulcers likely source of bleeding. Also mild portal HTN gastropathy, grade II esophageal varices and early Barrett's esophagus changes.  2. 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 Taking lactulose and rifaximin.  3. Ascites - recurrent, now is getting weekly paracentesis as OP. Inpatient LVP per pmd. Noted 3 L paracentesis on 4/21 and planned for paracentesis again today 4. Afib / RVR - per primary team, HR 90- 120, not on  any rate lowering meds , prob due to low BP's.  5. ESRD - recent start to dialysis in early April. Poor candidate for CRRT. Cont HDTTS.Will need BP's > 70- 75 in order to dialyze safely. If can't maintain BP's on HD will need hospice.Next HD tomorrow outpatient 6. Hypotension/ vol- most likely due to cirrhosis physiology +/- anemia. BP's a bit better on max-dose midodrine 20 tid. 2kg up by wts, no LEedema on exam, always has ascites.Dry weight moving target with ascites 7. Anemiackd+ abl - started darbe 60 ug weekly here (rec'd 4/20). Hgb stable at 8.2.  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 Phos on recent admit was low in 3.3- 5.0 range. Repeat phos 7.5 not on binders will need to start calcium acetate 1 with meals follow-up calcium  9. GOC - pt is DNR. Palliative care consultation appreciated. Overall prognosis is very poor. Patient is aware of this and wants to continue current interventions until he cannot tolerate them anymore.    Anice Paganini, PA-C 10/28/2020, 10:12 AM  [facility] Pager: [phone]

## 2020-10-29 DIAGNOSIS — N186 End stage renal disease: Secondary | ICD-10-CM | POA: Diagnosis not present

## 2020-10-29 DIAGNOSIS — Z992 Dependence on renal dialysis: Secondary | ICD-10-CM | POA: Diagnosis not present

## 2020-10-29 DIAGNOSIS — D631 Anemia in chronic kidney disease: Secondary | ICD-10-CM | POA: Diagnosis not present

## 2020-10-30 ENCOUNTER — Telehealth: Payer: Self-pay

## 2020-10-30 ENCOUNTER — Telehealth: Payer: Self-pay | Admitting: Nurse Practitioner

## 2020-10-30 NOTE — Telephone Encounter (Signed)
Spoke with patient's sister-in-law Maudie Mercury and scheduled an in-person Palliative Consult for 11/01/20 @ 12:30PM  COVID screening was negative. Pets in the home will put away before NP arrives. Patient lives with wife.  Consent obtained; updated Outlook/Netsmart/Team List and Epic.  Family is aware they may be receiving a call from NP the day before or day of to confirm appointment.

## 2020-10-30 NOTE — Telephone Encounter (Signed)
TC from [place] with [facility] stating that Pt's family is requesting that palliative care and would like to start as soon as possible. Langley Gauss is requesting a verbal order to start care from Dr Benay Spice. Discussed with Dr Renee Ramus verbal order.

## 2020-10-30 NOTE — Telephone Encounter (Signed)
I returned Dillon Perkins's call.  We discussed follow-up at the [facility].  She would like to hold on this for now, main focus focus is on getting him to dialysis.  It is very difficult to get him in and out of the house. She requests a referral to the palliative care program..  We will make the referral.  She will contact the office when she is ready to schedule a follow-up appointment.

## 2020-10-30 NOTE — Telephone Encounter (Signed)
Return tc to Pt inquiring about information on Palliative Care. Informed Pt's wife that we received a call from [facility] with request and Dr Benay Spice gave a verbal order to start care. Pt. Wife pleased with this information and stated that she wants to cancel appointments for now and focus on dialysis for Pt. Informed her that if she needed Dr. Benay Spice to see Pt she can give the office a call. Pt's wife verbalized understanding.

## 2020-10-31 ENCOUNTER — Telehealth: Payer: Self-pay | Admitting: Gastroenterology

## 2020-10-31 DIAGNOSIS — D631 Anemia in chronic kidney disease: Secondary | ICD-10-CM | POA: Diagnosis not present

## 2020-10-31 DIAGNOSIS — N186 End stage renal disease: Secondary | ICD-10-CM | POA: Diagnosis not present

## 2020-10-31 DIAGNOSIS — Z992 Dependence on renal dialysis: Secondary | ICD-10-CM | POA: Diagnosis not present

## 2020-10-31 NOTE — Telephone Encounter (Signed)
Patients wife called said the patient was given Carafate solution at the hospital and he would do better with a pill form medication asking if we can switch it please advise.

## 2020-10-31 NOTE — Telephone Encounter (Signed)
Discussed with wife that we will look at this at the office visit tomorrow.  Patient has an office visit at 1:30 .  He is not able to afford the xifaxan still $1200.  She is advised we will help her tomorrow with the assistance program.  Alonza Bogus, PA will review his meds and determine if okay to change to carafate tabs vs. Slurry.

## 2020-11-01 ENCOUNTER — Ambulatory Visit (INDEPENDENT_AMBULATORY_CARE_PROVIDER_SITE_OTHER): Payer: Medicare Other | Admitting: Gastroenterology

## 2020-11-01 ENCOUNTER — Telehealth: Payer: Self-pay

## 2020-11-01 ENCOUNTER — Encounter: Payer: Self-pay | Admitting: Gastroenterology

## 2020-11-01 ENCOUNTER — Other Ambulatory Visit: Payer: Self-pay | Admitting: Gastroenterology

## 2020-11-01 ENCOUNTER — Other Ambulatory Visit: Payer: Medicare Other | Admitting: Nurse Practitioner

## 2020-11-01 ENCOUNTER — Other Ambulatory Visit: Payer: Self-pay

## 2020-11-01 VITALS — BP 90/60 | HR 88 | Ht 68.0 in

## 2020-11-01 DIAGNOSIS — Z515 Encounter for palliative care: Secondary | ICD-10-CM | POA: Diagnosis not present

## 2020-11-01 DIAGNOSIS — K7031 Alcoholic cirrhosis of liver with ascites: Secondary | ICD-10-CM | POA: Diagnosis not present

## 2020-11-01 DIAGNOSIS — I9589 Other hypotension: Secondary | ICD-10-CM | POA: Diagnosis not present

## 2020-11-01 DIAGNOSIS — K7682 Hepatic encephalopathy: Secondary | ICD-10-CM

## 2020-11-01 DIAGNOSIS — K7290 Hepatic failure, unspecified without coma: Secondary | ICD-10-CM | POA: Diagnosis not present

## 2020-11-01 DIAGNOSIS — I8500 Esophageal varices without bleeding: Secondary | ICD-10-CM | POA: Insufficient documentation

## 2020-11-01 MED ORDER — SUCRALFATE 1 G PO TABS: 1.0000 g | ORAL_TABLET | Freq: Three times a day (TID) | ORAL | 1 refills | Status: AC

## 2020-11-01 NOTE — Progress Notes (Signed)
[facility] Consult Note Telephone: [phone]  Fax: [phone]    Date of encounter: 11/01/20 PATIENT NAME: Dillon Perkins [address] [place] [address]   [phone] (home)  DOB: Jun 12, 1951 MRN: [id number]   PRIMARY CARE PROVIDER:    Antony Contras, MD,  [address] [place] [address] [phone]  REFERRING PROVIDER:   Antony Contras, MD [place],  [place] [address] [phone]  RESPONSIBLE PARTY:    Contact Information    Name Relation Home Work Mobile   Jilberto, Vanderwall [phone]  [phone]     I met face to face with patient and family in home. Palliative Care was asked to follow this patient by consultation request of  Antony Contras, MD to address advance care planning and complex medical decision making. This is the initial visit.                                  ASSESSMENT AND PLAN / RECOMMENDATIONS:   Advance Care Planning/Goals of Care:  Today's visit consisted of building trust and discussions on Palliative care medicine as a specialized medical care for people living with serious illness, aimed at facilitating improved quality of life through symptoms relief, assisting with advance care planning and establishing goals of care. Family expressed appreciation for education provided on Palliative care and how it differs from Hospice service. Palliative care will continue to provide support to patient, family and the medical team. Code  Status: DNR Goal of Care: Goal of care is function. Directive: Signed DNR and MOST forms present in home, copy on [facility] EMR. Details of MOST form include limited additional intervention, determine use or limitation of antibiotics, IV fluids for a defined trial period. Patient desire to continue agressive treatment at this time. Palliative care will continue to provide support to patient, family and  the medical team.   I spent 16 minutes providing this consultation. More than 50% of the time in this consultation was spent in counseling and care coordination. ------------------------------------------------------------------------------  Symptom Management/Plan: Chronic low blood pressure: Condition complicated by patient receiving hemodialysis three times a week and paracentesis twice a week. Condition lead to generalized weakness and poor physical function, with patient depending on family for all his ADLs.  Plan: contiune current plan of care, follow up palliative care visit scheduled for 2 weeks to re-visit discussion regarding goals of care. End stage renal diseases: Cr/GFR 4.44/13 on 10/28/2020. Family has questions about patient's prognosis and time line without dialysis. Family made aware that patient's prognosis is like very poor with or without dialysis given his other comorbid condition. I will reach out to his Nephrologist to discuss patient's status.  Follow up Palliative Care Visit: Palliative care will continue to follow for complex medical decision making, advance care planning, and clarification of goals. Return 2 weeks or prn to evaluate patient's response to ongoing dialysis.  PPS: 40% weak  HOSPICE ELIGIBILITY/DIAGNOSIS: TBD  CHIEF COMPLAIN: low blood pressures  History obtained from review of EMR and discussion with patient and family.   HISTORY OF PRESENT ILLNESS:  Dillon Perkins is a 70 y.o. year old male with medical problems significant for alcoholic cirrhosis with portal hypertension, severe portal hypertensive gastropathy, and esophageal varices, hepatocellular carcinoma s/p radioembolization and immunotherapy with nivolumab, persistent atrial fibrillation not on anticoagulation due to risk of bleeding,  End stage renal disease started on hemodialysis on 10/09/2019. Family report patient with chronic hypotension which is aggravated by hemodialysis and paracentesis,  with patient having several episodes or near fall and occasional syncopal episodes. Family report patient's condition is negatively affecting his function and quality of life. No report of fever, chills, or increased SOB.  Patient with significant deconditioning due to worsening comobid conditions.  I reviewed available labs, medications, imaging, studies and related documents from the Epic EMR.  Records reviewed and summarized above.   Physical Exam: Limited physical exam as patient left mid visit for a medical appointment. Constitutional: NAD General: chronically ill and frail appearing  CV: no LE edema Pulmonary: no increased work of breathing, no cough, room air MSK: moves all extremities Neuro: generalized weakness,  no cognitive impairment Psych: non-anxious affect, A and O x 3  CURRENT PROBLEM LIST:  Patient Active Problem List   Diagnosis Date Noted  . Ascites   . Acute GI bleeding   . GI bleed 10/21/2020  . Acute metabolic encephalopathy   . Alcoholic cirrhosis of liver with ascites ([facility])   . Malnutrition of moderate degree 10/10/2020  . Sepsis ([place] [facility]) 10/09/2020  . Delirium 10/09/2020  . Altered mental status   . ESRD on dialysis ([facility])   . Stage 3a chronic kidney disease ([facility]) 09/05/2020  . Orthostatic hypotension 06/04/2020  . Kidney stones 05/22/2020  . Hepatocellular carcinoma ([facility]) 03/26/2020  . Goals of care, counseling/discussion 03/26/2020  . Educated about COVID-19 virus infection 11/07/2018  . Dilated cardiomyopathy ([facility]) 11/07/2018  . SOB (shortness of breath) 11/07/2018  . Chest pain   . Acute renal insufficiency   . Paroxysmal atrial fibrillation ([facility])   . Bradycardia 07/22/2014  . Exercise intolerance 09/24/2012  . Abnormal CXR 09/06/2012  . Abnormal LFTs (liver function tests) 02/07/2012  . Hyperlipidemia 02/07/2012  . Atrial fibrillation with RVR ([facility]) 01/31/2012  . Snoring 01/31/2012  . Essential hypertension 08/29/2010   PAST MEDICAL HISTORY:   Active Ambulatory Problems    Diagnosis Date Noted  . Essential hypertension 08/29/2010  . Atrial fibrillation with RVR ([facility]) 01/31/2012  . Snoring 01/31/2012  . Abnormal LFTs (liver function tests) 02/07/2012  . Hyperlipidemia 02/07/2012  . Abnormal CXR 09/06/2012  . Exercise intolerance 09/24/2012  . Bradycardia 07/22/2014  . Acute renal insufficiency   . Paroxysmal atrial fibrillation ([facility])   . Chest pain   . Educated about COVID-19 virus infection 11/07/2018  . Dilated cardiomyopathy ([facility]) 11/07/2018  . SOB (shortness of breath) 11/07/2018  . Hepatocellular carcinoma ([facility]) 03/26/2020  . Goals of care, counseling/discussion 03/26/2020  . Kidney stones 05/22/2020  . Orthostatic hypotension 06/04/2020  . Stage 3a chronic kidney disease ([place]) 09/05/2020  . Sepsis ([facility]) 10/09/2020  . Delirium 10/09/2020  . Altered mental status   . ESRD on dialysis ([facility] [facility])   . Malnutrition of moderate degree 10/10/2020  . Acute metabolic encephalopathy   . Alcoholic cirrhosis of liver with ascites ([facility])   . GI bleed 10/21/2020  . Ascites   . Acute GI bleeding    Resolved Ambulatory Problems    Diagnosis Date Noted  . No Resolved Ambulatory Problems   Past Medical History:  Diagnosis Date  . Alcoholic cirrhosis ([facility])   . Atrial fibrillation ([facility])   . End-stage renal disease on hemodialysis ([facility]) 10/08/2020  . GERD (gastroesophageal reflux disease)   . History of chemotherapy taking immunotherapy  . History of kidney stones   . Hypertension    SOCIAL HX:  Social  History   Tobacco Use  . Smoking status: Former Smoker    Packs/day: 1.00    Years: 0.50    Pack years: 0.50    Types: Cigarettes    Quit date: 08/21/1972    Years since quitting: 48.2  . Smokeless tobacco: Never Used  Substance Use Topics  . Alcohol use: Not Currently    Alcohol/week: 2.0 standard drinks    Types: 2 Cans of beer per week   FAMILY HX:  Family History  Problem Relation Age of Onset  . Heart disease  Father 75       Heart attack  . Heart disease Sister 92       Heart attack  . Breast cancer Mother   . Colon cancer Neg Hx   . Stomach cancer Neg Hx   . Pancreatic cancer Neg Hx   . Rectal cancer Neg Hx   . Esophageal cancer Neg Hx     ALLERGIES:  Allergies  Allergen Reactions  . Losartan Other (See Comments) and Cough    Dizziness and possible cough   . Losartan Potassium Other (See Comments) and Cough    Dizziness and possible cough  . Other Other (See Comments)    External lotions for itching- INEFFECTIVE  . Rivaroxaban Other (See Comments)    Gross hematuria      PERTINENT MEDICATIONS:  Outpatient Encounter Medications as of 11/01/2020  Medication Sig  . acetaminophen (TYLENOL) 500 MG tablet Take 500-1,000 mg by mouth See admin instructions. Take 500-1,000 mg by mouth at bedtime as needed for mild pain from itching and an additional 500 mg once a day as needed for discomfort or headaches- max daily dosage of 1,500 mg from all sources  . camphor-menthol (SARNA) lotion Apply topically 2 (two) times daily. (Patient not taking: No sig reported)  . feeding supplement (BOOST HIGH PROTEIN) LIQD Take 1 Container by mouth See admin instructions. Drink 1 container by mouth up to three times a day for supplementation  . feeding supplement (ENSURE ENLIVE / ENSURE PLUS) LIQD Take 237 mLs by mouth 4 (four) times daily.  . hydrocortisone cream 1 % Apply topically 3 (three) times daily as needed for itching.  . lactulose (CHRONULAC) 10 GM/15ML solution Take 30 mLs (20 g total) by mouth 2 (two) times daily.  . midodrine (PROAMATINE) 10 MG tablet Take 2 tablets (20 mg total) by mouth 3 (three) times daily with meals.  . multivitamin (RENA-VIT) TABS tablet Take 1 tablet by mouth at bedtime.  . pantoprazole (PROTONIX) 40 MG tablet Take 1 tablet (40 mg total) by mouth 2 (two) times daily.  . rifaximin (XIFAXAN) 550 MG TABS tablet Take 1 tablet (550 mg total) by mouth 2 (two) times daily for 60  doses. Samples of this drug were given to the patient, quantity 12, Lot Number [id number] exp 04/26  . sucralfate (CARAFATE) 1 GM/10ML suspension Take 10 mLs (1 g total) by mouth 4 (four) times daily -  with meals and at bedtime.   No facility-administered encounter medications on file as of 11/01/2020.    Thank you for the opportunity to participate in the care of Mr. Verhoeven. The palliative care team will continue to follow. Please call our office at [phone] if we can be of additional assistance.  Jari Favre, DNP, AGPCNP-BC  COVID-19 PATIENT SCREENING TOOL Asked and negative response unless otherwise noted:   Have you had symptoms of covid, tested positive or been in contact with someone with symptoms/positive test in  the past 5-10 days?

## 2020-11-01 NOTE — Patient Instructions (Addendum)
If you are age 72 or older, your body mass index should be between 23-30. Your Body mass index is 26.85 kg/m. If this is out of the aforementioned range listed, please consider follow up with your Primary Care Provider.  If you are age 38 or younger, your body mass index should be between 19-25. Your Body mass index is 26.85 kg/m. If this is out of the aformentioned range listed, please consider follow up with your Primary Care Provider.   We have sent the following medications to your pharmacy for you to pick up at your convenience: Carafate .  Melt carafate tablet in 30 ml distilled water for 30 minutes . Mix and drink.   Thank you for choosing me and [facility].  Marland Kitchen

## 2020-11-01 NOTE — Progress Notes (Signed)
11/01/2020 Dillon Perkins [id number] [birth year]   HISTORY OF PRESENT ILLNESS: This is a pleasant 70 year old male who is a patient Dr. Lynne Leader.  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  This been complicated by esophageal varices, ascites, hepatic encephalopathy, thrombocytopenia, and HCC.  He was just recently hospitalized and was seen by our service during that stay.  He was just discharged 4 days ago.  He has been on dialysis since the beginning of April.  He has been unable to manage his fluid/ascites with diuretics due to hypotension and his renal disease/dialysis.  He has been getting frequent paracentesis.  Was discussed with him during the hospital stay that maybe he get more frequent paracentesis with less volume removed to avoid affecting his blood pressure, etc.  They are here today to discuss scheduling those.  He is on lactulose 30 mL twice daily for hepatic encephalopathy as well as Xifaxan 550 mg twice daily.  They are unable to afford the Xifaxan so they are to receive assistance paperwork today.  He is having about 2 or 3 bowel movements a day with the current dose of lactulose.  He is on Carafate suspension, but they are asking if he can switch to pill form due to the cost.  S/p EGD 10/22/20 noted multiple duodenal ulcers.  Melena secondary to cratered duodenal ulcers, clean-based with no visible vessel.  Did not require endoscopic intervention. Small esophageal varices.    Past Medical History:  Diagnosis Date  . Alcoholic cirrhosis ([facility])   . Atrial fibrillation ([facility])   . End-stage renal disease on hemodialysis ([facility]) 10/08/2020  . GERD (gastroesophageal reflux disease)   . Hepatocellular carcinoma ([facility])   . History of chemotherapy taking immunotherapy   last dose 05-13-2020  . History of kidney stones   . Hyperlipidemia   . Hypertension    Past Surgical History:  Procedure Laterality Date  . APPENDECTOMY  yrs ago  . CYSTOSCOPY/URETEROSCOPY/HOLMIUM  LASER/STENT PLACEMENT Bilateral 05/22/2020   Procedure: CYSTOSCOPY/RETROGRADE/URETEROSCOPY/HOLMIUM LASER/STENT PLACEMENT STONE BASKET RETRIVAL ;  Surgeon: Ceasar Mons, MD;  Location: [facility];  Service: Urology;  Laterality: Bilateral;  . ESOPHAGOGASTRODUODENOSCOPY (EGD) WITH PROPOFOL N/A 10/22/2020   Procedure: ESOPHAGOGASTRODUODENOSCOPY (EGD) WITH PROPOFOL;  Surgeon: Mauri Pole, MD;  Location: [facility];  Service: Endoscopy;  Laterality: N/A;  . IR ANGIOGRAM SELECTIVE EACH ADDITIONAL VESSEL  02/19/2020  . IR ANGIOGRAM SELECTIVE EACH ADDITIONAL VESSEL  02/19/2020  . IR ANGIOGRAM SELECTIVE EACH ADDITIONAL VESSEL  02/19/2020  . IR ANGIOGRAM SELECTIVE EACH ADDITIONAL VESSEL  02/19/2020  . IR ANGIOGRAM SELECTIVE EACH ADDITIONAL VESSEL  02/19/2020  . IR ANGIOGRAM SELECTIVE EACH ADDITIONAL VESSEL  02/19/2020  . IR ANGIOGRAM SELECTIVE EACH ADDITIONAL VESSEL  03/01/2020  . IR ANGIOGRAM SELECTIVE EACH ADDITIONAL VESSEL  03/01/2020  . IR ANGIOGRAM SELECTIVE EACH ADDITIONAL VESSEL  03/01/2020  . IR ANGIOGRAM VISCERAL SELECTIVE  02/19/2020  . IR ANGIOGRAM VISCERAL SELECTIVE  03/01/2020  . IR EMBO ARTERIAL NOT HEMORR HEMANG INC GUIDE ROADMAPPING  02/19/2020  . IR EMBO TUMOR ORGAN ISCHEMIA INFARCT INC GUIDE ROADMAPPING  02/19/2020  . IR EMBO TUMOR ORGAN ISCHEMIA INFARCT INC GUIDE ROADMAPPING  03/01/2020  . IR PARACENTESIS  07/15/2020  . IR PARACENTESIS  08/02/2020  . IR PARACENTESIS  08/19/2020  . IR PARACENTESIS  09/09/2020  . IR PARACENTESIS  10/07/2020  . IR PARACENTESIS  10/10/2020  . IR PARACENTESIS  10/14/2020  . IR PARACENTESIS  10/24/2020  . IR PARACENTESIS  10/28/2020  .  IR RADIOLOGIST EVAL & MGMT  01/30/2020  . IR US GUIDE VASC ACCESS RIGHT  02/19/2020  . IR US GUIDE VASC ACCESS RIGHT  03/01/2020    reports that he quit smoking about 48 years ago. His smoking use included cigarettes. He has a 0.50 pack-year smoking history. He has never used smokeless tobacco. He reports  previous alcohol use of about 2.0 standard drinks of alcohol per week. He reports that he does not use drugs. family history includes Breast cancer in his mother; Heart disease (age of onset: 17) in his father; Heart disease (age of onset: 38) in his sister. Allergies  Allergen Reactions  . Losartan Other (See Comments) and Cough    Dizziness and possible cough   . Losartan Potassium Other (See Comments) and Cough    Dizziness and possible cough  . Other Other (See Comments)    External lotions for itching- INEFFECTIVE  . Rivaroxaban Other (See Comments)    Gross hematuria       Outpatient Encounter Medications as of 11/01/2020  Medication Sig  . acetaminophen (TYLENOL) 500 MG tablet Take 500-1,000 mg by mouth See admin instructions. Take 500-1,000 mg by mouth at bedtime as needed for mild pain from itching and an additional 500 mg once a day as needed for discomfort or headaches- max daily dosage of 1,500 mg from all sources  . camphor-menthol (SARNA) lotion Apply topically 2 (two) times daily.  . feeding supplement (BOOST HIGH PROTEIN) LIQD Take 1 Container by mouth See admin instructions. Drink 1 container by mouth up to three times a day for supplementation  . feeding supplement (ENSURE ENLIVE / ENSURE PLUS) LIQD Take 237 mLs by mouth 4 (four) times daily.  . hydrocortisone cream 1 % Apply topically 3 (three) times daily as needed for itching.  . lactulose (CHRONULAC) 10 GM/15ML solution Take 30 mLs (20 g total) by mouth 2 (two) times daily.  . midodrine (PROAMATINE) 10 MG tablet Take 2 tablets (20 mg total) by mouth 3 (three) times daily with meals.  . multivitamin (RENA-VIT) TABS tablet Take 1 tablet by mouth at bedtime.  . pantoprazole (PROTONIX) 40 MG tablet Take 1 tablet (40 mg total) by mouth 2 (two) times daily.  . rifaximin (XIFAXAN) 550 MG TABS tablet Take 1 tablet (550 mg total) by mouth 2 (two) times daily for 60 doses. Samples of this drug were given to the patient,  quantity 12, Lot Number [id number] exp 04/26  . sucralfate (CARAFATE) 1 GM/10ML suspension Take 10 mLs (1 g total) by mouth 4 (four) times daily -  with meals and at bedtime.   No facility-administered encounter medications on file as of 11/01/2020.     REVIEW OF SYSTEMS  : All other systems reviewed and negative except where noted in the History of Present Illness.   PHYSICAL EXAM: BP 90/60 (BP Location: Left Arm, Patient Position: Sitting)   Pulse 88   Ht 5\' 8"  (1.727 m)   BMI 26.85 kg/m  General:  Chronically ill-appearing white male in no acute distress; in wheelchair Head: Normocephalic and atraumatic Eyes:  Sclerae anicteric, conjunctiva pink. Ears: Normal auditory acuity Lungs: Clear throughout to auscultation; no W/R/R. Heart: Regular rate and rhythm; no M/R/G. Abdomen: Distended with ascites fluid.  BS present.  Non-tender. Musculoskeletal: Symmetrical with no gross deformities  Skin: No lesions on visible extremities Extremities: No edema  Neurological: Alert oriented x 4, grossly non-focal Psychological:  Alert and cooperative. Normal mood and affect  ASSESSMENT AND PLAN: 70 year old  male with alcoholic cirrhosis decompensated by ascites, hepatic encephalopathy, recurrent hepatocellular carcinoma and end-stage renal disease, initiated on hemodialysis October 09, 2020 which he gets TTS.  [place]eld 34  GI bleed: S/p EGD 10/22/20 noted multiple duodenal ulcers.  Melena secondary to cratered duodenal ulcers, clean-based with no visible vessel.  Did not require endoscopic intervention. Small esophageal varices.  Has been on Carafate suspension, but it is very expensive.  I will prescribe tablet form and we instructed him how to make the slurry.  Prescription sent to pharmacy.  No further sign of GI bleeding.  Thrombocytopenia secondary to portal hypertension and coagulopathy  Hepatic encephalopathy: Stable.  Continue Xifaxan and lactulose.  He is currently having about 2-3 bowel  movements a day on his dose of lactulose, 30 mL twice daily.  We will continue at that dose.  Given Xifaxan samples and paperwork on assistance program as they are unable to afford that.  Ascites: Moderate.  Treatment has been limited by hypotension and no need for dialysis.  Apparently discussions were had and it was decided that he undergo more frequent paracentesis, but less volume removed.  He would like to be scheduled for standing paracentesis twice a week.  We will plan to remove max of 3 L each time.  Hepatocellular carcinoma: 2 lesions in the right lobe of liver appear to be enlarged compared to prior concerning for recurrent hepatocellular carcinoma He is not a candidate for liver transplant, local or systemic therapy.  A. fib with RVR NSTEMI with chest pain and elevated troponin during recent hospital stay.   CC:  Antony Contras, MD

## 2020-11-01 NOTE — Progress Notes (Signed)
Reviewed and agree with management plan. Agree with scheduling 3L max paracenteses with IV albumin twice weekly.  Continue pantoprazole 40 mg po bid.   Pricilla Riffle. Fuller Plan, MD FACG [phone]

## 2020-11-01 NOTE — Telephone Encounter (Signed)
Left voicemail for patient to call me back regarding Paracentesis.

## 2020-11-02 DIAGNOSIS — N186 End stage renal disease: Secondary | ICD-10-CM | POA: Diagnosis not present

## 2020-11-02 DIAGNOSIS — Z992 Dependence on renal dialysis: Secondary | ICD-10-CM | POA: Diagnosis not present

## 2020-11-02 DIAGNOSIS — D631 Anemia in chronic kidney disease: Secondary | ICD-10-CM | POA: Diagnosis not present

## 2020-11-04 ENCOUNTER — Telehealth: Payer: Self-pay | Admitting: Gastroenterology

## 2020-11-04 DIAGNOSIS — K7031 Alcoholic cirrhosis of liver with ascites: Secondary | ICD-10-CM | POA: Diagnosis not present

## 2020-11-04 DIAGNOSIS — I4819 Other persistent atrial fibrillation: Secondary | ICD-10-CM | POA: Diagnosis not present

## 2020-11-04 DIAGNOSIS — Z992 Dependence on renal dialysis: Secondary | ICD-10-CM | POA: Diagnosis not present

## 2020-11-04 DIAGNOSIS — C220 Liver cell carcinoma: Secondary | ICD-10-CM | POA: Diagnosis not present

## 2020-11-04 DIAGNOSIS — R5381 Other malaise: Secondary | ICD-10-CM | POA: Diagnosis not present

## 2020-11-04 DIAGNOSIS — N186 End stage renal disease: Secondary | ICD-10-CM | POA: Diagnosis not present

## 2020-11-04 NOTE — Telephone Encounter (Signed)
Looks like Maya was trying to reach the pt in regards to this. Please send to her thanks

## 2020-11-04 NOTE — Telephone Encounter (Signed)
The pt wife has been given the phone number to the schedulers to try and get the appt sooner.  She was advised that if there are no sooner appts and the pt continues to have SOB he should go to the ED.

## 2020-11-04 NOTE — Telephone Encounter (Signed)
First available date for paracentesis was Wednesday. Patient is already aware of this. He is having difficulty breathing. Can you please triage this?

## 2020-11-04 NOTE — Telephone Encounter (Signed)
Pt's wife Dillon Perkins called stating that pt is having difficulty breathing and thinks that pt cannot wait for paracentesis until Wednesday, when he is scheduled. She is requesting is pt can have it done today. Pls call her.

## 2020-11-05 ENCOUNTER — Other Ambulatory Visit: Payer: Self-pay

## 2020-11-05 ENCOUNTER — Ambulatory Visit (HOSPITAL_COMMUNITY)
Admission: RE | Admit: 2020-11-05 | Discharge: 2020-11-05 | Disposition: A | Discharge status: Home / Self Care | Payer: Medicare Other | Source: Ambulatory Visit | Attending: Gastroenterology | Admitting: Gastroenterology

## 2020-11-05 DIAGNOSIS — K7031 Alcoholic cirrhosis of liver with ascites: Secondary | ICD-10-CM

## 2020-11-05 DIAGNOSIS — N186 End stage renal disease: Secondary | ICD-10-CM | POA: Diagnosis not present

## 2020-11-05 DIAGNOSIS — R188 Other ascites: Secondary | ICD-10-CM | POA: Diagnosis not present

## 2020-11-05 DIAGNOSIS — Z992 Dependence on renal dialysis: Secondary | ICD-10-CM | POA: Diagnosis not present

## 2020-11-05 HISTORY — PX: IR PARACENTESIS: IMG2679

## 2020-11-05 MED ORDER — ALBUMIN HUMAN 25 % IV SOLN: 25.0000 g | INTRAVENOUS | Status: AC

## 2020-11-05 MED ORDER — LIDOCAINE HCL 1 % IJ SOLN
INTRAMUSCULAR | Status: AC
  Filled 2020-11-05: qty 20

## 2020-11-05 MED ORDER — LIDOCAINE HCL (PF) 1 % IJ SOLN
INTRAMUSCULAR | Status: DC | PRN
  Administered 2020-11-05: 10 mL

## 2020-11-05 MED ORDER — ALBUMIN HUMAN 25 % IV SOLN: 12.5000 g | Freq: Once | INTRAVENOUS | Status: AC

## 2020-11-05 NOTE — Progress Notes (Deleted)
Cardiology Office Note   Date:  11/05/2020   ID:  Dillon Perkins, DOB 1950/10/29, MRN [id number]  PCP:  Dillon Contras, MD  Cardiologist:   Dillon Breeding, MD   No chief complaint on file.     History of Present Illness: Dillon Perkins is a 70 y.o. male who presents for evaluation of atrial fib. He has had a mildly reduced EF.  He is currently being treated for hepatocellular carcinoma with nivolumab.  He is having twice weekly paracentesis.  Of note I had him on Eliquis but he could not afford this.  He did not want to start warfarin.  I did note that his INR was elevated probably with some degree of auto anticoagulation.  He has been sensitive to rate control and I have been gingerly dosing Cardizem. ***     ***    has had paracentesis.  I did review these records.  Last time he took 3 L.  They have to give him albumin.  He has some low blood pressures.  He has progressive renal insufficiency.  MRI and December demonstrated that his cancer was stable and he has follow-up scheduled for April.  He presents with increased heart rate.  He does not really feel this.  Only after he has paracentesis and is walking out of radiology does he have some mild chest discomfort.  He is not sure exactly what his heart rate is doing at home but he is not having any presyncope or syncope.  He denies any PND or orthopnea..  He has no lightheadedness particularly he thinks related to his spironolactone.     Past Medical History:  Diagnosis Date  . Alcoholic cirrhosis ([place])   . Atrial fibrillation ([place])   . End-stage renal disease on hemodialysis ([place]) 10/08/2020  . GERD (gastroesophageal reflux disease)   . Hepatocellular carcinoma ([place])   . History of chemotherapy taking immunotherapy   last dose 05-13-2020  . History of kidney stones   . Hyperlipidemia   . Hypertension     Past Surgical History:  Procedure Laterality Date  . APPENDECTOMY  yrs ago  . CYSTOSCOPY/URETEROSCOPY/HOLMIUM  LASER/STENT PLACEMENT Bilateral 05/22/2020   Procedure: CYSTOSCOPY/RETROGRADE/URETEROSCOPY/HOLMIUM LASER/STENT PLACEMENT STONE BASKET RETRIVAL ;  Surgeon: Dillon Mons, MD;  Location: [facility];  Service: Urology;  Laterality: Bilateral;  . ESOPHAGOGASTRODUODENOSCOPY (EGD) WITH PROPOFOL N/A 10/22/2020   Procedure: ESOPHAGOGASTRODUODENOSCOPY (EGD) WITH PROPOFOL;  Surgeon: Dillon Pole, MD;  Location: [facility];  Service: Endoscopy;  Laterality: N/A;  . IR ANGIOGRAM SELECTIVE EACH ADDITIONAL VESSEL  02/19/2020  . IR ANGIOGRAM SELECTIVE EACH ADDITIONAL VESSEL  02/19/2020  . IR ANGIOGRAM SELECTIVE EACH ADDITIONAL VESSEL  02/19/2020  . IR ANGIOGRAM SELECTIVE EACH ADDITIONAL VESSEL  02/19/2020  . IR ANGIOGRAM SELECTIVE EACH ADDITIONAL VESSEL  02/19/2020  . IR ANGIOGRAM SELECTIVE EACH ADDITIONAL VESSEL  02/19/2020  . IR ANGIOGRAM SELECTIVE EACH ADDITIONAL VESSEL  03/01/2020  . IR ANGIOGRAM SELECTIVE EACH ADDITIONAL VESSEL  03/01/2020  . IR ANGIOGRAM SELECTIVE EACH ADDITIONAL VESSEL  03/01/2020  . IR ANGIOGRAM VISCERAL SELECTIVE  02/19/2020  . IR ANGIOGRAM VISCERAL SELECTIVE  03/01/2020  . IR EMBO ARTERIAL NOT HEMORR HEMANG INC GUIDE ROADMAPPING  02/19/2020  . IR EMBO TUMOR ORGAN ISCHEMIA INFARCT INC GUIDE ROADMAPPING  02/19/2020  . IR EMBO TUMOR ORGAN ISCHEMIA INFARCT INC GUIDE ROADMAPPING  03/01/2020  . IR PARACENTESIS  07/15/2020  . IR PARACENTESIS  08/02/2020  . IR PARACENTESIS  08/19/2020  . IR PARACENTESIS  09/09/2020  . IR PARACENTESIS  10/07/2020  . IR PARACENTESIS  10/10/2020  . IR PARACENTESIS  10/14/2020  . IR PARACENTESIS  10/24/2020  . IR PARACENTESIS  10/28/2020  . IR PARACENTESIS  11/05/2020  . IR RADIOLOGIST EVAL & MGMT  01/30/2020  . IR US GUIDE VASC ACCESS RIGHT  02/19/2020  . IR US GUIDE VASC ACCESS RIGHT  03/01/2020     Current Outpatient Medications  Medication Sig Dispense Refill  . acetaminophen (TYLENOL) 500 MG tablet Take 500-1,000 mg by mouth See admin  instructions. Take 500-1,000 mg by mouth at bedtime as needed for mild pain from itching and an additional 500 mg once a day as needed for discomfort or headaches- max daily dosage of 1,500 mg from all sources    . camphor-menthol (SARNA) lotion Apply topically 2 (two) times daily. 222 mL 0  . feeding supplement (BOOST HIGH PROTEIN) LIQD Take 1 Container by mouth See admin instructions. Drink 1 container by mouth up to three times a day for supplementation    . feeding supplement (ENSURE ENLIVE / ENSURE PLUS) LIQD Take 237 mLs by mouth 4 (four) times daily. 237 mL 12  . hydrocortisone cream 1 % Apply topically 3 (three) times daily as needed for itching. 30 g 0  . lactulose (CHRONULAC) 10 GM/15ML solution Take 30 mLs (20 g total) by mouth 2 (two) times daily. 236 mL 0  . midodrine (PROAMATINE) 10 MG tablet Take 2 tablets (20 mg total) by mouth 3 (three) times daily with meals. 120 tablet 1  . multivitamin (RENA-VIT) TABS tablet Take 1 tablet by mouth at bedtime. 90 tablet 0  . pantoprazole (PROTONIX) 40 MG tablet Take 1 tablet (40 mg total) by mouth 2 (two) times daily. 30 tablet 1  . rifaximin (XIFAXAN) 550 MG TABS tablet Take 1 tablet (550 mg total) by mouth 2 (two) times daily for 60 doses. Samples of this drug were given to the patient, quantity 12, Lot Number [id number] exp 04/26 60 tablet 0  . sucralfate (CARAFATE) 1 g tablet Take 1 tablet (1 g total) by mouth 4 (four) times daily -  before meals and at bedtime. 120 tablet 1   Current Facility-Administered Medications  Medication Dose Route Frequency Provider Last Rate Last Admin  . albumin human 25 % solution 12.5 g  12.5 g Intravenous Once Zehr, Jessica D, PA-C      . albumin human 25 % solution 25 g  25 g Intravenous Once per day on Mon Thu Zehr, Jessica D, PA-C       Facility-Administered Medications Ordered in Other Visits  Medication Dose Route Frequency Provider Last Rate Last Admin  . lidocaine (PF) (XYLOCAINE) 1 % injection    Infiltration PRN Han, Aimee H, PA-C   10 mL at 11/05/20 0940  . lidocaine (XYLOCAINE) 1 % (with pres) injection             Allergies:   Losartan, Losartan potassium, Other, and Rivaroxaban    ROS:  Please see the history of present illness.   Otherwise, review of systems are positive for ***.   All other systems are reviewed and negative.    PHYSICAL EXAM: VS:  There were no vitals taken for this visit. , BMI There is no height or weight on file to calculate BMI. GENERAL:  Well appearing NECK:  No jugular venous distention, waveform within normal limits, carotid upstroke brisk and symmetric, no bruits, no thyromegaly LUNGS:  Clear to auscultation bilaterally CHEST:  Unremarkable HEART:  PMI not displaced or sustained,S1 and S2 within normal limits, no S3, no S4, no clicks, no rubs, *** murmurs ABD:  Flat, positive bowel sounds normal in frequency in pitch, no bruits, no rebound, no guarding, no midline pulsatile mass, no hepatomegaly, no splenomegaly EXT:  2 plus pulses throughout, no edema, no cyanosis no clubbing    ***GENERAL: Chronically ill appearing NECK:  No jugular venous distention, waveform within normal limits, carotid upstroke brisk and symmetric, no bruits, no thyromegaly LUNGS:  Clear to auscultation bilaterally CHEST:  Unremarkable HEART:  PMI not displaced or sustained,S1 and S2 within normal limits, no S3, no clicks, no rubs, no murmurs, irregular. ABD: Distended, positive bowel sounds normal in frequency in pitch, no bruits, no rebound, no guarding, no midline pulsatile mass, no hepatomegaly, no splenomegaly, obvious ascites EXT:  2 plus pulses throughout, no edema, no cyanosis no clubbing   EKG:  EKG is *** ordered today. The ekg ordered today demonstrates atrial fibrillation, rate ***, axis within normal limits, intervals within normal limits, no acute ST-T wave changes.   Recent Labs: 07/04/2020: B Natriuretic Peptide 2,740.9 09/03/2020: TSH 2.530 10/22/2020:  ALT 55; Magnesium 2.4 10/28/2020: BUN 38; Creatinine, Ser 4.44; Hemoglobin 8.2; Platelets 90; Potassium 3.1; Sodium 131    Lipid Panel    Component Value Date/Time   CHOL 152 06/11/2020 0909   TRIG 62 06/11/2020 0909   HDL 28 (L) 06/11/2020 0909   CHOLHDL 5.4 06/11/2020 0909   VLDL 12 06/11/2020 0909   LDLCALC 112 ([place]) 06/11/2020 0909      Wt Readings from Last 3 Encounters:  10/26/20 176 lb 9.4 oz (80.1 kg)  10/14/20 180 lb 8 oz (81.9 kg)  10/02/20 194 lb 4.8 oz (88.1 kg)      Other studies Reviewed: Additional studies/ records that were reviewed today include: *** Review of the above records demonstrates:  Please see elsewhere in the note.     ASSESSMENT AND PLAN:   Atrial fibrillation  Mr. CHRISEAN KLOTH has a CHA2DS2 - VASc score of 2.  ***  He has not been able to afford DOAC and does not really want to consider this.  He does not consider warfarin.  There probably is some degree of auto anticoagulation.  His rate is too fast.  He has been sensitive to medications and is previously been taken off of higher dose Cardizem.  I am going to try to use low-dose immediate release Cardizem twice daily and we talked about dosing this based on heart rates and how to keep a check on his heart rate with a pulse oximeter.  I would invite him to send me these results in my chart.   Essential hypertension The blood pressures is ***not elevated.  He is keeping an eye on this.  CKD III Creatinine was elevated at *** 2.75.  This is followed by his primary provider.    Current medicines are reviewed at length with the patient today.  The patient does not have concerns regarding medicines.  The following changes have been made:  ***  Labs/ tests ordered today include:  ***  No orders of the defined types were placed in this encounter.    Disposition:   FU with me in *** months.   Signed, Dillon Breeding, MD  11/05/2020 6:19 PM    [facility]

## 2020-11-05 NOTE — Progress Notes (Signed)
Pt refused Albumin

## 2020-11-05 NOTE — Progress Notes (Signed)
Notfied by IR team that the ordering provider wants patient to receive albumin, and the order will be faxed.   Order was not received via fax by the time the procedure was done, patient stated that he does not want to wait.   Albumin was not given.    Armando Gang Dashae Wilcher PA-C 11/05/2020 10:04 AM

## 2020-11-05 NOTE — Procedures (Signed)
PROCEDURE SUMMARY:  Successful image-guided paracentesis from the right lower abdomen.  Yielded 3 liters of clear yellow fluid.  No immediate complications.  EBL = trace. Patient tolerated well.   Specimen was not sent for labs.  Please see imaging section of Epic for full dictation.   Armando Gang Che Rachal PA-C 11/05/2020 9:44 AM

## 2020-11-06 ENCOUNTER — Ambulatory Visit (HOSPITAL_COMMUNITY)
Admission: RE | Admit: 2020-11-06 | Discharge: 2020-11-06 | Disposition: A | Discharge status: Home / Self Care | Payer: Medicare Other | Source: Ambulatory Visit | Attending: Vascular Surgery | Admitting: Vascular Surgery

## 2020-11-06 ENCOUNTER — Other Ambulatory Visit: Payer: Self-pay | Admitting: Gastroenterology

## 2020-11-06 ENCOUNTER — Other Ambulatory Visit: Payer: Self-pay

## 2020-11-06 ENCOUNTER — Ambulatory Visit (INDEPENDENT_AMBULATORY_CARE_PROVIDER_SITE_OTHER)
Admission: RE | Admit: 2020-11-06 | Discharge: 2020-11-06 | Disposition: A | Discharge status: Home / Self Care | Payer: Medicare Other | Source: Ambulatory Visit | Attending: Vascular Surgery | Admitting: Vascular Surgery

## 2020-11-06 ENCOUNTER — Ambulatory Visit: Payer: Medicare Other | Admitting: Cardiology

## 2020-11-06 ENCOUNTER — Ambulatory Visit (HOSPITAL_COMMUNITY): Payer: Medicare Other

## 2020-11-06 ENCOUNTER — Encounter: Payer: Self-pay | Admitting: Vascular Surgery

## 2020-11-06 ENCOUNTER — Ambulatory Visit (INDEPENDENT_AMBULATORY_CARE_PROVIDER_SITE_OTHER): Payer: Medicare Other | Admitting: Vascular Surgery

## 2020-11-06 VITALS — BP 88/57 | HR 87 | Temp 97.7°F | Resp 16 | Ht 68.0 in | Wt 176.0 lb

## 2020-11-06 DIAGNOSIS — N186 End stage renal disease: Secondary | ICD-10-CM | POA: Insufficient documentation

## 2020-11-06 DIAGNOSIS — Z992 Dependence on renal dialysis: Secondary | ICD-10-CM

## 2020-11-06 DIAGNOSIS — N1832 Chronic kidney disease, stage 3b: Secondary | ICD-10-CM

## 2020-11-06 DIAGNOSIS — I10 Essential (primary) hypertension: Secondary | ICD-10-CM

## 2020-11-06 DIAGNOSIS — I4891 Unspecified atrial fibrillation: Secondary | ICD-10-CM

## 2020-11-06 NOTE — Progress Notes (Signed)
REASON FOR CONSULT:    To evaluate for AV access.  The consult is requested by Dr. Harrie Jeans.  ASSESSMENT & PLAN:   END-STAGE RENAL DISEASE: This patient is referred for hemodialysis access.  Based on his vein map it looks like his only option for a fistula would be a basilic vein transposition.  Given that he is right-handed we will start on the left side.  I explained that this would potentially be done in 2 stages depending upon the quality of the vein.  I am somewhat concerned about his blood pressure running low.  This is a chronic problem for him as he dialyzes 3 times a week and has paracentesis 2 times a week.  His platelet count is also 90,000.  He just recently had a GI bleed but this has been treated the family reports that he is back on Xarelto so I think it would be safe from that standpoint to proceed with surgery.  I have explained the indications for placement of an AV fistula or AV graft. I've explained that if at all possible we will place an AV fistula.  I have reviewed the risks of placement of an AV fistula including but not limited to: failure of the fistula to mature, need for subsequent interventions, and thrombosis. In addition I have reviewed the potential complications of placement of an AV graft. These risks include, but are not limited to, graft thrombosis, graft infection, wound healing problems, bleeding, arm swelling, and steal syndrome. All the patient's questions were answered and they are agreeable to proceed with surgery.  His surgery is scheduled for 11/11/2020.   Deitra Mayo, MD Office: [phone]   HPI:   Dillon Perkins is a pleasant 70 y.o. male, who was referred for hemodialysis access.  I have reviewed the records from the referring office.  The patient dialyzes on Tuesdays Thursdays and Saturdays.  He typically has paracentesis on Mondays and Fridays.  His blood pressure chronically runs low.  His medical docs are working on trying to adjust  his medications to help with this.  He is on Xarelto for A. fib.  He does not have a pacemaker.  He denies any recent uremic symptoms except for some dyspnea on exertion.   Past Medical History:  Diagnosis Date  . Alcoholic cirrhosis ([place])   . Atrial fibrillation ([place])   . End-stage renal disease on hemodialysis ([place]) 10/08/2020  . GERD (gastroesophageal reflux disease)   . Hepatocellular carcinoma ([place])   . History of chemotherapy taking immunotherapy   last dose 05-13-2020  . History of kidney stones   . Hyperlipidemia   . Hypertension     Family History  Problem Relation Age of Onset  . Heart disease Father 71       Heart attack  . Heart disease Sister 24       Heart attack  . Breast cancer Mother   . Colon cancer Neg Hx   . Stomach cancer Neg Hx   . Pancreatic cancer Neg Hx   . Rectal cancer Neg Hx   . Esophageal cancer Neg Hx     SOCIAL HISTORY: Social History   Socioeconomic History  . Marital status: Married    Spouse name: [name]  . Number of children: 3  . Years of education: Not on file  . Highest education level: Not on file  Occupational History  . Occupation: retired  Tobacco Use  . Smoking status: Former Smoker    Packs/day: 1.00  Years: 0.50    Pack years: 0.50    Types: Cigarettes    Quit date: 08/21/1972    Years since quitting: 48.2  . Smokeless tobacco: Never Used  Vaping Use  . Vaping Use: Never used  Substance and Sexual Activity  . Alcohol use: Not Currently    Alcohol/week: 2.0 standard drinks    Types: 2 Cans of beer per week  . Drug use: No  . Sexual activity: Not on file  Other Topics Concern  . Not on file  Social History Narrative   Lives with wife.  Retired.  Three sons.    Social Determinants of Health   Financial Resource Strain: Not on file  Food Insecurity: Not on file  Transportation Needs: Not on file  Physical Activity: Not on file  Stress: Not on file  Social Connections: Not on file  Intimate Partner  Violence: Not on file    Allergies  Allergen Reactions  . Losartan Other (See Comments) and Cough    Dizziness and possible cough   . Losartan Potassium Other (See Comments) and Cough    Dizziness and possible cough  . Other Other (See Comments)    External lotions for itching- INEFFECTIVE  . Rivaroxaban Other (See Comments)    Gross hematuria     Current Outpatient Medications  Medication Sig Dispense Refill  . acetaminophen (TYLENOL) 500 MG tablet Take 500-1,000 mg by mouth See admin instructions. Take 500-1,000 mg by mouth at bedtime as needed for mild pain from itching and an additional 500 mg once a day as needed for discomfort or headaches- max daily dosage of 1,500 mg from all sources    . camphor-menthol (SARNA) lotion Apply topically 2 (two) times daily. 222 mL 0  . feeding supplement (BOOST HIGH PROTEIN) LIQD Take 1 Container by mouth See admin instructions. Drink 1 container by mouth up to three times a day for supplementation    . feeding supplement (ENSURE ENLIVE / ENSURE PLUS) LIQD Take 237 mLs by mouth 4 (four) times daily. 237 mL 12  . hydrocortisone cream 1 % Apply topically 3 (three) times daily as needed for itching. 30 g 0  . lactulose (CHRONULAC) 10 GM/15ML solution Take 30 mLs (20 g total) by mouth 2 (two) times daily. 236 mL 0  . midodrine (PROAMATINE) 10 MG tablet Take 2 tablets (20 mg total) by mouth 3 (three) times daily with meals. 120 tablet 1  . multivitamin (RENA-VIT) TABS tablet Take 1 tablet by mouth at bedtime. 90 tablet 0  . pantoprazole (PROTONIX) 40 MG tablet Take 1 tablet (40 mg total) by mouth 2 (two) times daily. 30 tablet 1  . rifaximin (XIFAXAN) 550 MG TABS tablet Take 1 tablet (550 mg total) by mouth 2 (two) times daily for 60 doses. Samples of this drug were given to the patient, quantity 12, Lot Number [id number] exp 04/26 60 tablet 0  . sucralfate (CARAFATE) 1 g tablet Take 1 tablet (1 g total) by mouth 4 (four) times daily -  before meals and at  bedtime. 120 tablet 1   Current Facility-Administered Medications  Medication Dose Route Frequency Provider Last Rate Last Admin  . albumin human 25 % solution 12.5 g  12.5 g Intravenous Once Zehr, Jessica D, PA-C      . albumin human 25 % solution 25 g  25 g Intravenous Once per day on Mon Thu Zehr, Jessica D, PA-C        REVIEW OF SYSTEMS:  [X]   denotes positive finding, [ ]  denotes negative finding Cardiac  Comments:  Chest pain or chest pressure: x   Shortness of breath upon exertion: x   Short of breath when lying flat:    Irregular heart rhythm: x       Vascular    Pain in calf, thigh, or hip brought on by ambulation:    Pain in feet at night that wakes you up from your sleep:     Blood clot in your veins:    Leg swelling:         Pulmonary    Oxygen at home:    Productive cough:     Wheezing:  x       Neurologic    Sudden weakness in arms or legs:     Sudden numbness in arms or legs:     Sudden onset of difficulty speaking or slurred speech:    Temporary loss of vision in one eye:     Problems with dizziness:  x       Gastrointestinal    Blood in stool:  x   Vomited blood:         Genitourinary    Burning when urinating:     Blood in urine:        Psychiatric    Major depression:         Hematologic    Bleeding problems:    Problems with blood clotting too easily:        Skin    Rashes or ulcers:        Constitutional    Fever or chills:     PHYSICAL EXAM:   Vitals:   11/06/20 0912  BP: (!) 88/57  Pulse: 87  Resp: 16  Temp: 97.7 F (36.5 C)  SpO2: 98%  Weight: 176 lb (79.8 kg)  Height: 5\' 8"  (1.727 m)    GENERAL: The patient is a well-nourished male, in no acute distress. The vital signs are documented above. CARDIAC: There is a regular rate and rhythm.  VASCULAR: I do not detect carotid bruits. He has palpable brachial and radial pulses bilaterally. He has no significant lower extremity swelling. PULMONARY: There is good air exchange  bilaterally without wheezing or rales. ABDOMEN: Soft and non-tender with normal pitched bowel sounds.  MUSCULOSKELETAL: There are no major deformities or cyanosis. NEUROLOGIC: No focal weakness or paresthesias are detected. SKIN: There are no ulcers or rashes noted. PSYCHIATRIC: The patient has a normal affect.  DATA:    VEIN MAP: I have independently interpreted the patient's vein map.  On the right side the forearm and upper arm cephalic vein look small.  The upper arm cephalic vein narrows down to 1.8 mm.  In the forearm the vein appears to be clotted.  The basilic vein on the right looks marginal in size.  On the left side, the forearm and upper arm cephalic vein do not appear to be adequate.  The basilic vein on the left is marginal in size.  UPPER EXTREMITY ARTERIAL DUPLEX: I have independently interpreted his upper extremity arterial duplex scan.  On the right side there is a triphasic radial and ulnar waveform with the Doppler.  The brachial artery measures 5.4 mm in diameter.  On the left side there is a triphasic radial and ulnar waveform.  The brachial artery measures 4.8 mm in diameter.    LABS: I have reviewed the labs from 10/28/2020.  The potassium was 3.1.  GFR was 13.  Hemoglobin 8.2.  Platelets 90,000.

## 2020-11-08 ENCOUNTER — Encounter (HOSPITAL_COMMUNITY): Payer: Self-pay | Admitting: Vascular Surgery

## 2020-11-08 ENCOUNTER — Telehealth: Payer: Self-pay

## 2020-11-08 ENCOUNTER — Ambulatory Visit (HOSPITAL_COMMUNITY)
Admission: RE | Admit: 2020-11-08 | Discharge: 2020-11-08 | Disposition: A | Discharge status: Home / Self Care | Payer: Medicare Other | Source: Ambulatory Visit | Attending: Gastroenterology | Admitting: Gastroenterology

## 2020-11-08 ENCOUNTER — Encounter (HOSPITAL_COMMUNITY): Payer: Self-pay | Admitting: Physician Assistant

## 2020-11-08 ENCOUNTER — Other Ambulatory Visit: Payer: Self-pay

## 2020-11-08 ENCOUNTER — Other Ambulatory Visit (HOSPITAL_COMMUNITY): Payer: Medicare Other

## 2020-11-08 DIAGNOSIS — K7031 Alcoholic cirrhosis of liver with ascites: Secondary | ICD-10-CM

## 2020-11-08 DIAGNOSIS — R188 Other ascites: Secondary | ICD-10-CM | POA: Diagnosis not present

## 2020-11-08 HISTORY — PX: IR PARACENTESIS: IMG2679

## 2020-11-08 MED ORDER — LIDOCAINE HCL (PF) 1 % IJ SOLN
INTRAMUSCULAR | Status: DC | PRN
  Administered 2020-11-08: 10 mL

## 2020-11-08 MED ORDER — LIDOCAINE HCL 1 % IJ SOLN
INTRAMUSCULAR | Status: AC
  Filled 2020-11-08: qty 20

## 2020-11-08 NOTE — Procedures (Signed)
PROCEDURE SUMMARY:  Successful image-guided paracentesis from the right lateral abdomen.  Yielded 3.0 liters of clear gold fluid.  No immediate complications.  EBL = 0 mL. Patient tolerated well.   Specimen was not sent for labs.  Please see imaging section of Epic for full dictation.   Claris Pong Jadda Hunsucker PA-C 11/08/2020 10:25 AM

## 2020-11-08 NOTE — Progress Notes (Signed)
Surgical Instructions    Your procedure is scheduled on May 9  Report to [facility] Main Entrance "A" at 0700 A.M., then check in with the Admitting office.  Call this number if you have problems the morning of surgery:  [phone]   If you have any questions prior to your surgery date call [phone]: Open Monday-Friday 8am-4pm    Remember:  Do not eat or drink after midnight the night before your surgery      Take these medicines the morning of surgery with A SIP OF WATER  acetaminophen (TYLENOL) if needed midodrine (PROAMATINE) pantoprazole (PROTONIX) rifaximin (XIFAXAN)  As of today, STOP taking any Aspirin (unless otherwise instructed by your surgeon) Aleve, Naproxen, Ibuprofen, Motrin, Advil, Goody's, BC's, all herbal medications, fish oil, and all vitamins.                     Do not wear jewelry            Do not wear lotions, powders, colognes, or deodorant.            Men may shave face and neck.            Do not bring valuables to the hospital.            [facility] is not responsible for any belongings or valuables.  Do NOT Smoke (Tobacco/Vaping) or drink Alcohol 24 hours prior to your procedure If you use a CPAP at night, you may bring all equipment for your overnight stay.   Contacts, glasses, dentures or bridgework may not be worn into surgery, please bring cases for these belongings   For patients admitted to the hospital, discharge time will be determined by your treatment team.   Patients discharged the day of surgery will not be allowed to drive home, and someone needs to stay with them for 24 hours.    Special instructions:   [facility]- Preparing For Surgery  Before surgery, you can play an important role. Because skin is not sterile, your skin needs to be as free of germs as possible. You can reduce the number of germs on your skin by washing with CHG (chlorahexidine gluconate) Soap before surgery.  CHG is an antiseptic cleaner which kills  germs and bonds with the skin to continue killing germs even after washing.    Oral Hygiene is also important to reduce your risk of infection.  Remember - BRUSH YOUR TEETH THE MORNING OF SURGERY WITH YOUR REGULAR TOOTHPASTE  Please do not use if you have an allergy to CHG or antibacterial soaps. If your skin becomes reddened/irritated stop using the CHG.  Do not shave (including legs and underarms) for at least 48 hours prior to first CHG shower. It is OK to shave your face.  Please follow these instructions carefully.   1. Shower the NIGHT BEFORE SURGERY and the MORNING OF SURGERY  2. If you chose to wash your hair, wash your hair first as usual with your normal shampoo.  3. After you shampoo, rinse your hair and body thoroughly to remove the shampoo.  4. Wash Face and genitals (private parts) with your normal soap.   5.  Shower the NIGHT BEFORE SURGERY and the MORNING OF SURGERY with CHG Soap.   6. Use CHG Soap as you would any other liquid soap. You can apply CHG directly to the skin and wash gently with a scrungie or a clean washcloth.   7. Apply the CHG Soap  to your body ONLY FROM THE NECK DOWN.  Do not use on open wounds or open sores. Avoid contact with your eyes, ears, mouth and genitals (private parts). Wash Face and genitals (private parts)  with your normal soap.   8. Wash thoroughly, paying special attention to the area where your surgery will be performed.  9. Thoroughly rinse your body with warm water from the neck down.  10. DO NOT shower/wash with your normal soap after using and rinsing off the CHG Soap.  11. Pat yourself dry with a CLEAN TOWEL.  12. Wear CLEAN PAJAMAS to bed the night before surgery  13. Place CLEAN SHEETS on your bed the night before your surgery  14. DO NOT SLEEP WITH PETS.   Day of Surgery: Take a shower with CHG soap. Wear Clean/Comfortable clothing the morning of surgery Do not apply any deodorants/lotions.   Remember to brush your  teeth WITH YOUR REGULAR TOOTHPASTE.   Please read over the following fact sheets that you were given.

## 2020-11-08 NOTE — Progress Notes (Signed)
Spoke with patietns wife,  Marinus Maw is currently in IR having a procedure today.  I typed up the pre-op instructions and hand delivered it to the patients wife, Soap given to patients wife as well.  Medical hx was reviewed with patients wife and no changes to his medical history.     PCP -  Cardiologist -    Chest x-ray -  EKG - 10/21/20 Stress Test -  ECHO - 11/10/18 Cardiac Cath -  COVID TEST- 11/08/20   Anesthesia review: yes hx afib Patient no longer on xarelto  Patient denies shortness of breath, fever, cough and chest pain at PAT appointment   All instructions explained to the patient, with a verbal understanding of the material. Patient agrees to go over the instructions while at home for a better understanding. Patient also instructed to self quarantine after being tested for COVID-19. The opportunity to ask questions was provided.

## 2020-11-08 NOTE — Progress Notes (Signed)
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 Received 2 unit PRBC. EGD 10/22/20 noted multiple duodenal ulcers. Melena secondary to cratered duodenal ulcers, clean-based with no visible vessel.Did not require endoscopic intervention.Small esophageal varices. Also had episode of atypical chest pain in setting of profound hypertension and Afib with AVR. Per discharge summary 10/28/20, "- Chest pain resolved with resolution of hypotension- Troponin elevated to 4000, cardiology sidelined, evaluated November 2021, was not a candidate for cardiac cath at that time and he is arguably a much worse candidate now."  He has been unable to manage his fluid/ascites with diuretics due to hypotension and his renal disease/dialysis.  He is on lactulose 30 mL twice daily for hepatic encephalopathy as well as Xifaxan 550 mg twice daily. He currently has standing schedule for twice weekly paracentesis with plan to remove max 3L each time. Last seen by GI 11/01/20, per note his MELD score was 34.  Per surgery posting pt to hold Xarelto 48hrs prior.   Will need DOS labs and eval.  EKG 10/21/20: Afib with RVR. Rate 131.  TTE 11/10/18: 1. The left ventricle has normal systolic function, with an ejection  fraction of 55-60%. The cavity size was normal. There is moderately  increased left ventricular wall thickness. Left ventricular diastolic  Doppler parameters are indeterminate.  2. The right ventricle has normal systolic  function. The cavity was  normal. There is no increase in right ventricular wall thickness.  3. Left atrial size was moderately dilated.  4. Right atrial size was mildly dilated.  5. Mild thickening of the mitral valve leaflet. Mild calcification of the  mitral valve leaflet. There is mild mitral annular calcification present.  6. The aortic valve is tricuspid. Moderate thickening of the aortic  valve. Sclerosis without any evidence of stenosis of the aortic valve.     Wynonia Musty [facility] Short Stay Center/Anesthesiology Phone [phone] 11/08/2020 12:12 PM

## 2020-11-08 NOTE — Telephone Encounter (Signed)
Telephone call received from pts wife requesting to cxl left BVT surgery on 11/11/20 due to pt has decided to discontinue dialysis.

## 2020-11-08 NOTE — Anesthesia Preprocedure Evaluation (Deleted)
Anesthesia Evaluation    Airway        Dental   Pulmonary former smoker,           Cardiovascular hypertension,      Neuro/Psych    GI/Hepatic   Endo/Other    Renal/GU      Musculoskeletal   Abdominal   Peds  Hematology   Anesthesia Other Findings   Reproductive/Obstetrics                             Anesthesia Physical Anesthesia Plan  ASA:   Anesthesia Plan:    Post-op Pain Management:    Induction:   PONV Risk Score and Plan:   Airway Management Planned:   Additional Equipment:   Intra-op Plan:   Post-operative Plan:   Informed Consent:   Plan Discussed with:   Anesthesia Plan Comments: (PAT note by Karoline Caldwell, PA-C: Complex medical history significant foralcoholic cirrhosis with portal hypertension, severe portal hypertensive gastropathy, esophageal varices, thrombocytopenia secondary to portal hypertension and coagulopathy, 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 Received 2 unit PRBC. EGD 10/22/20 noted multiple duodenal ulcers. Melena secondary to cratered duodenal ulcers, clean-based with no visible vessel.Did not require endoscopic intervention.Small esophageal varices. Also had episode of atypical chest pain in setting of profound hypertension and Afib with AVR. Per discharge summary 10/28/20, "- Chest pain resolved with resolution of hypotension- Troponin elevated to 4000, cardiology sidelined, evaluated November 2021, was not a candidate for cardiac cath at that time and he is arguably a much worse candidate now."  He has been unable to manage his fluid/ascites with diuretics due to hypotension and his renal  disease/dialysis.  He is on lactulose 30 mL twice daily for hepatic encephalopathy as well as Xifaxan 550 mg twice daily. He currently has standing schedule for twice weekly paracentesis with plan to remove max 3L each time. Last seen by GI 11/01/20, per note his MELD score was 34.  Per surgery posting pt to hold Xarelto 48hrs prior.   Will need DOS labs and eval.  EKG 10/21/20: Afib with RVR. Rate 131.  TTE 11/10/18: 1. The left ventricle has normal systolic function, with an ejection  fraction of 55-60%. The cavity size was normal. There is moderately  increased left ventricular wall thickness. Left ventricular diastolic  Doppler parameters are indeterminate.  2. The right ventricle has normal systolic function. The cavity was  normal. There is no increase in right ventricular wall thickness.  3. Left atrial size was moderately dilated.  4. Right atrial size was mildly dilated.  5. Mild thickening of the mitral valve leaflet. Mild calcification of the  mitral valve leaflet. There is mild mitral annular calcification present.  6. The aortic valve is tricuspid. Moderate thickening of the aortic  valve. Sclerosis without any evidence of stenosis of the aortic valve.   )        Anesthesia Quick Evaluation

## 2020-11-11 ENCOUNTER — Encounter (HOSPITAL_COMMUNITY): Admission: RE | Payer: Self-pay | Source: Home / Self Care

## 2020-11-11 ENCOUNTER — Ambulatory Visit (HOSPITAL_COMMUNITY): Admission: RE | Admit: 2020-11-11 | Payer: Medicare Other | Source: Home / Self Care | Admitting: Vascular Surgery

## 2020-11-11 ENCOUNTER — Other Ambulatory Visit: Payer: Self-pay

## 2020-11-11 ENCOUNTER — Ambulatory Visit (HOSPITAL_COMMUNITY)
Admission: RE | Admit: 2020-11-11 | Discharge: 2020-11-11 | Disposition: A | Discharge status: Home / Self Care | Payer: Medicare Other | Source: Ambulatory Visit | Attending: Gastroenterology | Admitting: Gastroenterology

## 2020-11-11 DIAGNOSIS — K7031 Alcoholic cirrhosis of liver with ascites: Secondary | ICD-10-CM | POA: Diagnosis not present

## 2020-11-11 HISTORY — PX: IR PARACENTESIS: IMG2679

## 2020-11-11 SURGERY — TRANSPOSITION, VEIN, BASILIC
Anesthesia: Choice | Laterality: Left

## 2020-11-11 MED ORDER — LIDOCAINE HCL 1 % IJ SOLN
INTRAMUSCULAR | Status: AC
  Filled 2020-11-11: qty 20

## 2020-11-11 MED ORDER — LIDOCAINE HCL (PF) 1 % IJ SOLN
INTRAMUSCULAR | Status: AC | PRN
  Administered 2020-11-11: 10 mL

## 2020-11-11 NOTE — Telephone Encounter (Signed)
The pt has been advised that she should call the PCP or have the pt go to the ED for follow up of the low blood pressure. She states she has already made that call.  She wants to know if the albumin was ordered with paracentesis.  I have looked in to the chart and the order has been entered.  The wife thanked me for the call.

## 2020-11-11 NOTE — Telephone Encounter (Signed)
Patients wife called to advise that the patients blood pressure is dangerously low and they are seeking advise also wants to know if the recent paracentesis if the Albumin was given to the patient because the patient is telling her that he is not getting it.

## 2020-11-11 NOTE — Procedures (Signed)
PROCEDURE SUMMARY:  Successful image-guided paracentesis from the left lateral abdomen.  Yielded 3.0 liters of clear gold fluid.  No immediate complications.  EBL = 0 mL. Patient tolerated well.   Specimen was not sent for labs.  Please see imaging section of Epic for full dictation.   Claris Pong Matilyn Fehrman PA-C 11/11/2020 10:32 AM

## 2020-11-14 DIAGNOSIS — Z452 Encounter for adjustment and management of vascular access device: Secondary | ICD-10-CM | POA: Diagnosis not present

## 2020-11-14 DIAGNOSIS — N186 End stage renal disease: Secondary | ICD-10-CM | POA: Diagnosis not present

## 2020-11-14 DIAGNOSIS — Z992 Dependence on renal dialysis: Secondary | ICD-10-CM | POA: Diagnosis not present

## 2020-11-15 ENCOUNTER — Other Ambulatory Visit: Payer: Self-pay | Admitting: Gastroenterology

## 2020-11-15 DIAGNOSIS — K7031 Alcoholic cirrhosis of liver with ascites: Secondary | ICD-10-CM

## 2020-11-18 ENCOUNTER — Other Ambulatory Visit: Payer: Medicare Other | Admitting: Nurse Practitioner

## 2020-11-18 ENCOUNTER — Other Ambulatory Visit: Payer: Self-pay

## 2020-11-18 ENCOUNTER — Ambulatory Visit (HOSPITAL_COMMUNITY)
Admission: RE | Admit: 2020-11-18 | Discharge: 2020-11-18 | Disposition: A | Discharge status: Home / Self Care | Payer: Medicare Other | Source: Ambulatory Visit | Attending: Gastroenterology | Admitting: Gastroenterology

## 2020-11-18 VITALS — BP 92/54 | HR 70 | Resp 16

## 2020-11-18 DIAGNOSIS — C801 Malignant (primary) neoplasm, unspecified: Secondary | ICD-10-CM | POA: Diagnosis not present

## 2020-11-18 DIAGNOSIS — R630 Anorexia: Secondary | ICD-10-CM | POA: Diagnosis not present

## 2020-11-18 DIAGNOSIS — Z515 Encounter for palliative care: Secondary | ICD-10-CM | POA: Diagnosis not present

## 2020-11-18 DIAGNOSIS — K7031 Alcoholic cirrhosis of liver with ascites: Secondary | ICD-10-CM | POA: Insufficient documentation

## 2020-11-18 DIAGNOSIS — R110 Nausea: Secondary | ICD-10-CM | POA: Diagnosis not present

## 2020-11-18 HISTORY — PX: IR PARACENTESIS: IMG2679

## 2020-11-18 MED ORDER — LIDOCAINE HCL 1 % IJ SOLN
INTRAMUSCULAR | Status: AC
  Filled 2020-11-18: qty 20

## 2020-11-18 MED ORDER — ONDANSETRON HCL 4 MG PO TABS: 4.0000 mg | ORAL_TABLET | Freq: Three times a day (TID) | ORAL | 0 refills | Status: AC | PRN

## 2020-11-18 MED ORDER — LIDOCAINE HCL (PF) 1 % IJ SOLN
INTRAMUSCULAR | Status: DC | PRN
  Administered 2020-11-18: 30 mL

## 2020-11-18 NOTE — Procedures (Signed)
PROCEDURE SUMMARY:  Successful image-guided paracentesis from the right lateral abdomen.  Yielded 3.0 liters of clear gold fluid.  No immediate complications.  EBL < 1 mL. Patient tolerated well.   Specimen was not sent for labs.  Please see imaging section of Epic for full dictation.   Claris Pong Katira Dumais PA-C 11/18/2020 9:14 AM

## 2020-11-18 NOTE — Progress Notes (Signed)
[facility] Consult Note Telephone: [phone]  Fax: [phone]    Date of encounter: 11/18/20 PATIENT NAME: Dillon Perkins [address] [place] [address]   [phone] (home)  DOB: 1950-08-28 MRN: [id number]  PRIMARY CARE PROVIDER:    Antony Contras, Perkins,  [address] [place] [address] [phone]  REFERRING PROVIDER:   Antony Contras, Perkins [place],  [place] [address] [phone]  RESPONSIBLE PARTY:    Contact Information    Name Relation Home Work Mobile   Dillon Perkins [phone]  [phone]     I met face to face with patient and spouse in home. Palliative Care was asked to follow this patient by consultation request of  Dillon Perkins to address advance care planning and complex medical decision making. This is a follow up visit.                                   ASSESSMENT AND PLAN / RECOMMENDATIONS:   Advance Care Planning/Goals of Care: Goals include to maximize quality of life and symptom management. Our advance care planning conversation included a discussion about:     The value and importance of advance care planning   Experiences with loved ones who have been seriously ill or have died   Exploration of personal, cultural or spiritual beliefs that might influence medical decisions   Exploration of goals of care in the event of a sudden injury or illness   Review and updating or creation of an  advance directive document .  Decision to de-escalate disease focused treatments due to poor prognosis.  CODE STATUS: DNR Goal of care: Goal of care is comfort. Directives: Patient has Signed DNR and MOST present in Home, Copy on [facility] EMR.Patient reiterated desire to not be resuscitated in the event of cardiac or respiratory arrest.   I spent 25 minutes providing this consultation, more than 50% of the time in this  consultation was spent counseling and coordinating communication.  --------------------------------------------------------------------------------------------  Symptom Management/Plan: Nausea: order placed for Zofran 69m tablet. Patient to take one tablet by mouth every 8 hour as needed for nausea, #20 tablets. Prescription sent electronically to his pharmacy. Poor appetite: Continue Ensure nutritional supplement. Currently takes 1-2 cans a day. Encouraged to aim for at least 3 cans a day to augment caloric intake. Encouraged snacking between meals. End stage renal disease: Patient stopped dialysis one week ago for comfort. He report feeling better today, report having more energy since stopping dialysis. He however expressed interest in knowing his current kidney function. He was advised to discuss the possibility of completing laboratory evaluation with his primary care physician. Low blood pressure: condition is ongoing, worsened by paracentesis which patient completed today. BP today 92/54, HR 70. Patient on Midodrine 240mby mouth three times a day. Patient denied lightheadedness, denied chest pain. Continue current therapy. Continue supportive care. Encouraged changing position slowly, prevent falls. Chronic ascities: ascites related to complication of hepatocellular carcinoma. Patient receives paracentesis twice a week. Family verbalized interest in obtaining a port so family can drain fluid in home to prevent frequent visit for fluid drainage at the GI clinic.  Frailty: Patient is total assist with his ADLs. Ambulates with a wheelchair, he one to two person assist with transfers. We discussed patient's current illness and what it means in the  larger context of patient's on-going co-morbidities. Natural disease trajectory and expectations at EOL were discussed in the context of ESRD without dialysis. I attempted to elicit values and goals of care important to the patient. Patient reiterated desire  for comfort measures. The difference between aggressive medical intervention and comfort care was considered in light of the patient's goals. Home Hospice services and philosophy were discussed and explained in detail. Patient and his wife verbalized interest in home Hospice care.  Provided general support and encouragement. Questions and concerns were addressed. Patient and his family was encouraged to call with questions and/or concerns.  Palliative care will continue to provide support to patient, family and the medical team. Phone call made to patient's PCP Dillon Perkins office to discuss patient's decision, message left for him with my contact information.  PPS: 40% weak  HOSPICE ELIGIBILITY/DIAGNOSIS: yes/ ESRD off dialysis, hepatocellular cancer not on treatment.  CHIEF COMPLAIN: Nausea  History obtained from review of Epic EMR and discussion with patient and his wife.  HISTORY OF PRESENT ILLNESS:  Dillon Perkins is a 70 y.o. year old male with medical problems significant foralcoholic cirrhosis with portal hypertension, severe portal hypertensive gastropathy, and esophageal varices, hepatocellular carcinoma s/p radioembolization and immunotherapy with nivolumab, persistent atrial fibrillation not on anticoagulation due to risk of bleeding, ESRD (off dialysis). Patient had a complain of nausea, symptoms started a week ago, family report symptoms occurs intermittently. Symptoms is aggravated by hiccups, nothing makes it better or worse. Patient report symptoms is associated with poor appetite. Patient denied abdominal pain, denied hematemesis. No report of fever, chills, or increased SOB. Ten systems reviewed and are negative for acute change, except as noted in the HPI. Patient with ongoing deconditioning due to worsening comobid conditions.  Component Ref Range & Units 3 wk ago  (10/28/20) 3 wk ago  (10/27/20) 3 wk ago  (10/26/20) 3 wk ago  (10/25/20) 3 wk ago  (10/24/20) 3 wk ago   (10/22/20) 3 wk ago  (10/22/20)  Sodium 135 - 145 mmol/L 131Low  132Low  133Low  134Low  135  132Low  132Low   Potassium 3.5 - 5.1 mmol/L 3.1Low  3.2Low  3.8  3.8  4.0  4.8  5.1   Chloride 98 - 111 mmol/L 97Low  97Low  98  98  99  95Low  96Low   CO2 22 - 32 mmol/L 21Low  _0 Glucose, Bld 70 - 99 mg/dL 95  93 CM  116High CM  109High CM  154High CM  141High CM  126High CM   Comment: Glucose reference range applies only to samples taken after fasting for at least 8 hours.  BUN 8 - 23 mg/dL 38High  24High  41High  27High  46High  76High  55High   Creatinine, Ser 0.61 - 1.24 mg/dL 4.44High  3.18High  4.37High  3.21High  4.32High  5.85High  5.15High   Calcium 8.9 - 10.3 mg/dL 8.8Low  8.5Low  8.8Low  8.8Low  8.8Low  8.8Low  9.0   GFR, Estimated >60 mL/min 13Low  20Low CM  14Low CM  20Low CM  14Low CM  10Low CM     Component Ref Range & Units 3 wk ago  (10/28/20) 3 wk ago  (10/27/20) 3 wk ago  (10/26/20) 3 wk ago  (10/25/20) 3 wk ago  (10/24/20) 3 wk ago  (10/24/20) 3 wk ago  (10/22/20)  WBC 4.0 - 10.5 K/uL 6.3  7.3  7.3  8.2   8.7  8.5   RBC 4.22 - 5.81 MIL/uL 2.31Low  2.20Low  2.20Low  2.26Low   1.90Low  2.09Low   Hemoglobin 13.0 - 17.0 g/dL 8.2Low  7.8Low  7.8Low  8.1Low  7.9Low  6.8Low Panic CM  7.4Low   HCT 39.0 - 52.0 % 24.8Low  23.5Low  23.3Low  23.8Low  23.4Low CM  20.3Low  21.9Low   MCV 80.0 - 100.0 fL 107.4High  106.8High  105.9High  105.3High   106.8High  104.8High   MCH 26.0 - 34.0 pg 35.5High  35.5High  35.5High  35.8High   35.8High  35.4High   MCHC 30.0 - 36.0 g/dL 33.1  33.2  33.5  34.0   33.5  33.8   RDW 11.5 - 15.5 % 23.1High  22.5High  21.7High  21.4High   22.3High  22.3High   Platelets 150 - 400 K/uL 90Low  91Low CM  112Low CM  103Low CM   108Low CM     I reviewed available labs,  medications, imaging, studies and related documents from the EMR.  Records reviewed and summarized above.   Today's Vitals   11/18/20 1222  BP: (!) 92/54  Pulse: 70  Resp: 16  SpO2: 97%  PainSc: 0-No pain   Vitals with BMI 11/18/2020 11/18/2020 11/18/2020  Height - - -  Weight - - -  BMI - - -  Systolic 92 94 82  Diastolic 54 73 68  Pulse 70 - -   Physical Exam: General: chronically ill and frail appearing, sitting in chair in NAD  EYES: anicteric sclera, no discharge  ENMT: intact hearing, oral mucous membranes moist CV: irregular HR, no LE edema Pulmonary: LCTA, no increased work of breathing, no cough, room air Abdomen: intake <25%, soft and non tender, no ascites GU: deferred MSK: moderate sarcopenia, moves all extremities, non-ambulatory Skin: Ashy, warm and dry, no rashes, skin tear noted to right elbow, ecchymotic area noted to bilateral arms Neuro: generalized weakness, no cognitive impairment Psych: non-anxious affect, A and O x 3 Hem/lymph/immuno: no widespread bruising  Past Medical History:  Diagnosis Date  . Alcoholic cirrhosis ([place])   . Atrial fibrillation ([place])   . End-stage renal disease on hemodialysis ([place]) 10/08/2020  . GERD (gastroesophageal reflux disease)   . Hepatocellular carcinoma ([place])   . History of chemotherapy taking immunotherapy   last dose 05-13-2020  . History of kidney stones   . Hyperlipidemia   . Hypertension    Current Outpatient Medications on File Prior to Visit  Medication Sig Dispense Refill  . acetaminophen (TYLENOL) 500 MG tablet Take 500-1,000 mg by mouth See admin instructions. Take 500-1,000 mg by mouth at bedtime as needed for mild pain from itching and an additional 500 mg once a day as needed for discomfort or headaches- max daily dosage of 1,500 mg from all sources    . camphor-menthol (SARNA) lotion Apply topically 2 (two) times daily. (Patient taking differently: Apply 1 application topically 2 (two) times daily as needed  for itching.) 222 mL 0  . feeding supplement (ENSURE ENLIVE / ENSURE PLUS) LIQD Take 237 mLs by mouth 4 (four) times daily. (Patient taking differently: Take 237 mLs by mouth 4 (four) times daily as needed (for nutritional support).) 237 mL 12  . hydrocortisone cream 1 % Apply topically 3 (three) times daily as needed for itching. (Patient taking differently: Apply 1 application topically 3 (three) times daily as needed for itching.) 30 g 0  . lactulose (  CHRONULAC) 10 GM/15ML solution Take 30 mLs (20 g total) by mouth 2 (two) times daily. 1800 mL 0  . midodrine (PROAMATINE) 10 MG tablet Take 2 tablets (20 mg total) by mouth 3 (three) times daily with meals. 120 tablet 1  . multivitamin (RENA-VIT) TABS tablet Take 1 tablet by mouth at bedtime. 90 tablet 0  . pantoprazole (PROTONIX) 40 MG tablet Take 1 tablet (40 mg total) by mouth 2 (two) times daily. 30 tablet 1  . rifaximin (XIFAXAN) 550 MG TABS tablet Take 1 tablet (550 mg total) by mouth 2 (two) times daily for 60 doses. Samples of this drug were given to the patient, quantity 12, Lot Number [id number] exp 04/26 60 tablet 0  . sucralfate (CARAFATE) 1 g tablet Take 1 tablet (1 g total) by mouth 4 (four) times daily -  before meals and at bedtime. 120 tablet 1   Thank you for the opportunity to participate in the care of Mr. HofmannThe palliative care team will continue to follow. Please call our office at [phone] if we can be of additional assistance.   Jari Favre, DNP, AGPCNP-BC  COVID-19 PATIENT SCREENING TOOL Asked and negative response unless otherwise noted:   Have you had symptoms of covid, tested positive or been in contact with someone with symptoms/positive test in the past 5-10 days?

## 2020-11-19 ENCOUNTER — Telehealth: Payer: Self-pay

## 2020-11-19 NOTE — Progress Notes (Deleted)
Cardiology Office Note   Date:  11/19/2020   ID:  [name], DOB 04/10/1951, MRN [id number]  PCP:  Antony Contras, MD  Cardiologist:   Minus Breeding, MD   No chief complaint on file.     History of Present Illness: Dillon Perkins is a 70 y.o. male who presents for evaluation of atrial fib. He has had a mildly reduced EF.  He is currently being treated for hepatocellular carcinoma with nivolumab.  He is having twice weekly paracentesis.  Of note I had him on Eliquis but he could not afford this.  He did not want to start warfarin.  I did note that his INR was elevated probably with some degree of auto anticoagulation.  He has been sensitive to rate control and I have been gingerly dosing Cardizem. ***     ***    has had paracentesis.  I did review these records.  Last time he took 3 L.  They have to give him albumin.  He has some low blood pressures.  He has progressive renal insufficiency.  MRI and December demonstrated that his cancer was stable and he has follow-up scheduled for April.  He presents with increased heart rate.  He does not really feel this.  Only after he has paracentesis and is walking out of radiology does he have some mild chest discomfort.  He is not sure exactly what his heart rate is doing at home but he is not having any presyncope or syncope.  He denies any PND or orthopnea..  He has no lightheadedness particularly he thinks related to his spironolactone.     Past Medical History:  Diagnosis Date  . Alcoholic cirrhosis ([place])   . Atrial fibrillation ([place])   . End-stage renal disease on hemodialysis ([place]) 10/08/2020  . GERD (gastroesophageal reflux disease)   . Hepatocellular carcinoma ([place])   . History of chemotherapy taking immunotherapy   last dose 05-13-2020  . History of kidney stones   . Hyperlipidemia   . Hypertension     Past Surgical History:  Procedure Laterality Date  . APPENDECTOMY  yrs ago  . CYSTOSCOPY/URETEROSCOPY/HOLMIUM  LASER/STENT PLACEMENT Bilateral 05/22/2020   Procedure: CYSTOSCOPY/RETROGRADE/URETEROSCOPY/HOLMIUM LASER/STENT PLACEMENT STONE BASKET RETRIVAL ;  Surgeon: Ceasar Mons, MD;  Location: [facility];  Service: Urology;  Laterality: Bilateral;  . ESOPHAGOGASTRODUODENOSCOPY (EGD) WITH PROPOFOL N/A 10/22/2020   Procedure: ESOPHAGOGASTRODUODENOSCOPY (EGD) WITH PROPOFOL;  Surgeon: Mauri Pole, MD;  Location: [facility];  Service: Endoscopy;  Laterality: N/A;  . IR ANGIOGRAM SELECTIVE EACH ADDITIONAL VESSEL  02/19/2020  . IR ANGIOGRAM SELECTIVE EACH ADDITIONAL VESSEL  02/19/2020  . IR ANGIOGRAM SELECTIVE EACH ADDITIONAL VESSEL  02/19/2020  . IR ANGIOGRAM SELECTIVE EACH ADDITIONAL VESSEL  02/19/2020  . IR ANGIOGRAM SELECTIVE EACH ADDITIONAL VESSEL  02/19/2020  . IR ANGIOGRAM SELECTIVE EACH ADDITIONAL VESSEL  02/19/2020  . IR ANGIOGRAM SELECTIVE EACH ADDITIONAL VESSEL  03/01/2020  . IR ANGIOGRAM SELECTIVE EACH ADDITIONAL VESSEL  03/01/2020  . IR ANGIOGRAM SELECTIVE EACH ADDITIONAL VESSEL  03/01/2020  . IR ANGIOGRAM VISCERAL SELECTIVE  02/19/2020  . IR ANGIOGRAM VISCERAL SELECTIVE  03/01/2020  . IR EMBO ARTERIAL NOT HEMORR HEMANG INC GUIDE ROADMAPPING  02/19/2020  . IR EMBO TUMOR ORGAN ISCHEMIA INFARCT INC GUIDE ROADMAPPING  02/19/2020  . IR EMBO TUMOR ORGAN ISCHEMIA INFARCT INC GUIDE ROADMAPPING  03/01/2020  . IR PARACENTESIS  07/15/2020  . IR PARACENTESIS  08/02/2020  . IR PARACENTESIS  08/19/2020  . IR PARACENTESIS  09/09/2020  . IR PARACENTESIS  10/07/2020  . IR PARACENTESIS  10/10/2020  . IR PARACENTESIS  10/14/2020  . IR PARACENTESIS  10/24/2020  . IR PARACENTESIS  10/28/2020  . IR PARACENTESIS  11/05/2020  . IR PARACENTESIS  11/08/2020  . IR PARACENTESIS  11/11/2020  . IR PARACENTESIS  11/18/2020  . IR RADIOLOGIST EVAL & MGMT  01/30/2020  . IR US GUIDE VASC ACCESS RIGHT  02/19/2020  . IR US GUIDE VASC ACCESS RIGHT  03/01/2020     Current Outpatient Medications  Medication Sig  Dispense Refill  . acetaminophen (TYLENOL) 500 MG tablet Take 500-1,000 mg by mouth See admin instructions. Take 500-1,000 mg by mouth at bedtime as needed for mild pain from itching and an additional 500 mg once a day as needed for discomfort or headaches- max daily dosage of 1,500 mg from all sources    . camphor-menthol (SARNA) lotion Apply topically 2 (two) times daily. (Patient taking differently: Apply 1 application topically 2 (two) times daily as needed for itching.) 222 mL 0  . feeding supplement (ENSURE ENLIVE / ENSURE PLUS) LIQD Take 237 mLs by mouth 4 (four) times daily. (Patient taking differently: Take 237 mLs by mouth 4 (four) times daily as needed (for nutritional support).) 237 mL 12  . hydrocortisone cream 1 % Apply topically 3 (three) times daily as needed for itching. (Patient taking differently: Apply 1 application topically 3 (three) times daily as needed for itching.) 30 g 0  . lactulose (CHRONULAC) 10 GM/15ML solution Take 30 mLs (20 g total) by mouth 2 (two) times daily. 1800 mL 0  . midodrine (PROAMATINE) 10 MG tablet Take 2 tablets (20 mg total) by mouth 3 (three) times daily with meals. 120 tablet 1  . multivitamin (RENA-VIT) TABS tablet Take 1 tablet by mouth at bedtime. 90 tablet 0  . ondansetron (ZOFRAN) 4 MG tablet Take 1 tablet (4 mg total) by mouth every 8 (eight) hours as needed for nausea or vomiting. 20 tablet 0  . pantoprazole (PROTONIX) 40 MG tablet Take 1 tablet (40 mg total) by mouth 2 (two) times daily. 30 tablet 1  . rifaximin (XIFAXAN) 550 MG TABS tablet Take 1 tablet (550 mg total) by mouth 2 (two) times daily for 60 doses. Samples of this drug were given to the patient, quantity 12, Lot Number [id number] exp 04/26 60 tablet 0  . sucralfate (CARAFATE) 1 g tablet Take 1 tablet (1 g total) by mouth 4 (four) times daily -  before meals and at bedtime. 120 tablet 1   Current Facility-Administered Medications  Medication Dose Route Frequency Provider Last Rate Last  Admin  . albumin human 25 % solution 12.5 g  12.5 g Intravenous Once Zehr, Jessica D, PA-C      . albumin human 25 % solution 25 g  25 g Intravenous Once per day on Mon Thu Zehr, Jessica D, PA-C        Allergies:   Losartan potassium, Other, and Rivaroxaban    ROS:  Please see the history of present illness.   Otherwise, review of systems are positive for ***.   All other systems are reviewed and negative.    PHYSICAL EXAM: VS:  There were no vitals taken for this visit. , BMI There is no height or weight on file to calculate BMI. GENERAL:  Well appearing NECK:  No jugular venous distention, waveform within normal limits, carotid upstroke brisk and symmetric, no bruits, no thyromegaly LUNGS:  Clear to auscultation bilaterally  CHEST:  Unremarkable HEART:  PMI not displaced or sustained,S1 and S2 within normal limits, no S3, no clicks, no rubs, *** murmurs, irregular ABD:  Flat, positive bowel sounds normal in frequency in pitch, no bruits, no rebound, no guarding, no midline pulsatile mass, no hepatomegaly, no splenomegaly EXT:  2 plus pulses throughout, no edema, no cyanosis no clubbing    ***GENERAL: Chronically ill appearing NECK:  No jugular venous distention, waveform within normal limits, carotid upstroke brisk and symmetric, no bruits, no thyromegaly LUNGS:  Clear to auscultation bilaterally CHEST:  Unremarkable HEART:  PMI not displaced or sustained,S1 and S2 within normal limits, no S3, no clicks, no rubs, no murmurs, irregular. ABD: Distended, positive bowel sounds normal in frequency in pitch, no bruits, no rebound, no guarding, no midline pulsatile mass, no hepatomegaly, no splenomegaly, obvious ascites EXT:  2 plus pulses throughout, no edema, no cyanosis no clubbing   EKG:  EKG is *** ordered today. The ekg ordered today demonstrates atrial fibrillation, rate ***, axis within normal limits, intervals within normal limits, no acute ST-T wave changes.   Recent  Labs: 07/04/2020: B Natriuretic Peptide 2,740.9 09/03/2020: TSH 2.530 10/22/2020: ALT 55; Magnesium 2.4 10/28/2020: BUN 38; Creatinine, Ser 4.44; Hemoglobin 8.2; Platelets 90; Potassium 3.1; Sodium 131    Lipid Panel    Component Value Date/Time   CHOL 152 06/11/2020 0909   TRIG 62 06/11/2020 0909   HDL 28 (L) 06/11/2020 0909   CHOLHDL 5.4 06/11/2020 0909   VLDL 12 06/11/2020 0909   LDLCALC 112 (H) 06/11/2020 0909      Wt Readings from Last 3 Encounters:  11/06/20 176 lb (79.8 kg)  10/26/20 176 lb 9.4 oz (80.1 kg)  10/14/20 180 lb 8 oz (81.9 kg)      Other studies Reviewed: Additional studies/ records that were reviewed today include: *** Review of the above records demonstrates:  Please see elsewhere in the note.     ASSESSMENT AND PLAN:   Atrial fibrillation  Dillon Perkins has a CHA2DS2 - VASc score of 2.  ***  He has not been able to afford DOAC and does not really want to consider this.  He does not consider warfarin.  There probably is some degree of auto anticoagulation.  His rate is too fast.  He has been sensitive to medications and is previously been taken off of higher dose Cardizem.  I am going to try to use low-dose immediate release Cardizem twice daily and we talked about dosing this based on heart rates and how to keep a check on his heart rate with a pulse oximeter.  I would invite him to send me these results in my chart.   Essential hypertension The blood pressures is ***not elevated.  He is keeping an eye on this.  CKD III Creatinine was elevated at *** 2.75.  This is followed by his primary provider.    Current medicines are reviewed at length with the patient today.  The patient does not have concerns regarding medicines.  The following changes have been made:  ***  Labs/ tests ordered today include:  ***  No orders of the defined types were placed in this encounter.    Disposition:   FU with me in *** months.   Signed, Minus Breeding,  MD  11/19/2020 7:57 PM    [facility]

## 2020-11-19 NOTE — Telephone Encounter (Signed)
TC from [place] with [facility] who visits Pt. Nurse called to inform Dr. Learta Codding that Pt is off dialysis and is going for his scheduled paracentesis 2 x a week  And has become hypotensive.Nurse states that Pt is wheelchair bound and it is starting to get hard on his wife to take Pt to paracentesis appointments Nurse inquiring if Pleurx drain can be placed and she will teach wife how to drain the Pt. TC to wife who stated she will be comfortable if taught how to drain the Pt. Per Dr Benay Spice Pt will be scheduled for video visit to discuss these options. Scheduling message sent

## 2020-11-20 ENCOUNTER — Ambulatory Visit: Payer: Medicare Other | Admitting: Cardiology

## 2020-11-20 DIAGNOSIS — I4891 Unspecified atrial fibrillation: Secondary | ICD-10-CM | POA: Diagnosis not present

## 2020-11-20 DIAGNOSIS — F1011 Alcohol abuse, in remission: Secondary | ICD-10-CM | POA: Diagnosis not present

## 2020-11-20 DIAGNOSIS — G9341 Metabolic encephalopathy: Secondary | ICD-10-CM | POA: Diagnosis not present

## 2020-11-20 DIAGNOSIS — C220 Liver cell carcinoma: Secondary | ICD-10-CM | POA: Diagnosis not present

## 2020-11-20 DIAGNOSIS — N1832 Chronic kidney disease, stage 3b: Secondary | ICD-10-CM

## 2020-11-20 DIAGNOSIS — N189 Chronic kidney disease, unspecified: Secondary | ICD-10-CM | POA: Diagnosis not present

## 2020-11-20 DIAGNOSIS — I129 Hypertensive chronic kidney disease with stage 1 through stage 4 chronic kidney disease, or unspecified chronic kidney disease: Secondary | ICD-10-CM | POA: Diagnosis not present

## 2020-11-20 DIAGNOSIS — K219 Gastro-esophageal reflux disease without esophagitis: Secondary | ICD-10-CM | POA: Diagnosis not present

## 2020-11-20 DIAGNOSIS — I482 Chronic atrial fibrillation, unspecified: Secondary | ICD-10-CM

## 2020-11-20 DIAGNOSIS — K7031 Alcoholic cirrhosis of liver with ascites: Secondary | ICD-10-CM | POA: Diagnosis not present

## 2020-11-20 DIAGNOSIS — I10 Essential (primary) hypertension: Secondary | ICD-10-CM

## 2020-11-20 DIAGNOSIS — E785 Hyperlipidemia, unspecified: Secondary | ICD-10-CM | POA: Diagnosis not present

## 2020-11-21 ENCOUNTER — Inpatient Hospital Stay: Payer: Medicare Other | Admitting: Oncology

## 2020-11-21 DIAGNOSIS — K7031 Alcoholic cirrhosis of liver with ascites: Secondary | ICD-10-CM | POA: Diagnosis not present

## 2020-11-21 DIAGNOSIS — F1011 Alcohol abuse, in remission: Secondary | ICD-10-CM | POA: Diagnosis not present

## 2020-11-21 DIAGNOSIS — C220 Liver cell carcinoma: Secondary | ICD-10-CM | POA: Diagnosis not present

## 2020-11-21 DIAGNOSIS — I129 Hypertensive chronic kidney disease with stage 1 through stage 4 chronic kidney disease, or unspecified chronic kidney disease: Secondary | ICD-10-CM | POA: Diagnosis not present

## 2020-11-21 DIAGNOSIS — N189 Chronic kidney disease, unspecified: Secondary | ICD-10-CM | POA: Diagnosis not present

## 2020-11-21 DIAGNOSIS — G9341 Metabolic encephalopathy: Secondary | ICD-10-CM | POA: Diagnosis not present

## 2020-11-22 ENCOUNTER — Ambulatory Visit (HOSPITAL_COMMUNITY): Payer: Medicare Other | Attending: Gastroenterology

## 2020-11-22 DIAGNOSIS — I129 Hypertensive chronic kidney disease with stage 1 through stage 4 chronic kidney disease, or unspecified chronic kidney disease: Secondary | ICD-10-CM | POA: Diagnosis not present

## 2020-11-22 DIAGNOSIS — F1011 Alcohol abuse, in remission: Secondary | ICD-10-CM | POA: Diagnosis not present

## 2020-11-22 DIAGNOSIS — G9341 Metabolic encephalopathy: Secondary | ICD-10-CM | POA: Diagnosis not present

## 2020-11-22 DIAGNOSIS — C220 Liver cell carcinoma: Secondary | ICD-10-CM | POA: Diagnosis not present

## 2020-11-22 DIAGNOSIS — K7031 Alcoholic cirrhosis of liver with ascites: Secondary | ICD-10-CM | POA: Diagnosis not present

## 2020-11-22 DIAGNOSIS — N189 Chronic kidney disease, unspecified: Secondary | ICD-10-CM | POA: Diagnosis not present

## 2020-11-26 ENCOUNTER — Telehealth: Payer: Self-pay | Admitting: Gastroenterology

## 2020-11-26 NOTE — Telephone Encounter (Signed)
FYI Dillon Perkins  ?

## 2020-11-26 NOTE — Telephone Encounter (Signed)
Sheri please see message regarding meds

## 2020-11-26 NOTE — Telephone Encounter (Signed)
I'm very sorry about Mr. Herro's passing. Please send my condolences to his wife and family.  I don't think we can take medications from patients even if not opened but please double check with Barb Merino.

## 2020-11-28 NOTE — Telephone Encounter (Signed)
I spoke with the patients wife and passed along our condolences

## 2020-12-04 ENCOUNTER — Ambulatory Visit: Payer: Medicare Other | Admitting: Physician Assistant

## 2020-12-04 DEATH — deceased

## 2022-01-06 IMAGING — MR MR ABDOMEN WO/W CM
13 of 17 series · 36 of 48 positions shown · IV contrast (gadavist)
Comparison: CT abdomen pelvis, 01/02/2020
COMPARISON: CT abdomen pelvis, 01/02/2020

Addendum:
CLINICAL DATA: Follow-up NAGWA 5 lesions, cirrhosis

EXAM:
MRI ABDOMEN WITHOUT AND WITH CONTRAST
TECHNIQUE: Multiplanar multisequence MR imaging of the abdomen was performed
both before and after the administration of intravenous contrast.
CONTRAST:  10mL GADAVIST GADOBUTROL 1 MMOL/ML IV SOLN

[Series 3: T2 · coronal · 6.0mm · 1.64mm/px · 2 of 32 slices shown]
[im 1/32]
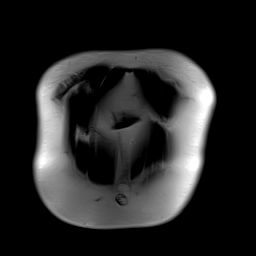
[im 32/32]
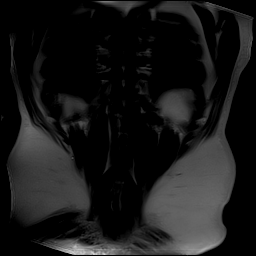

[Series 5: T2 fat-sat · axial · 6.0mm · 1.25mm/px · z∈[-71,+181]mm · 2 of 36 slices shown]
[im 1/36]
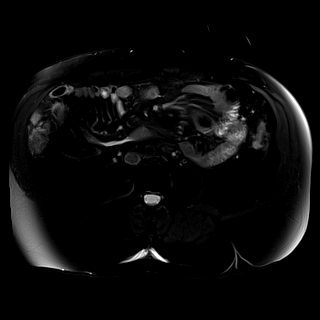
[im 36/36]
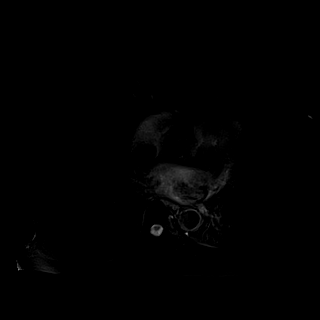

[Series 6: T1 · axial · 3.0mm · 1.25mm/px · z∈[-83,+154]mm · 4 of 80 slices shown (1 of 2)]
[im 1/80]
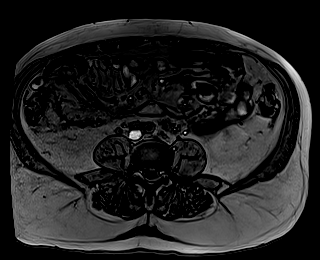
[im 27/80]
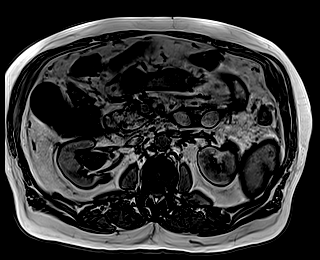
[im 53/80]
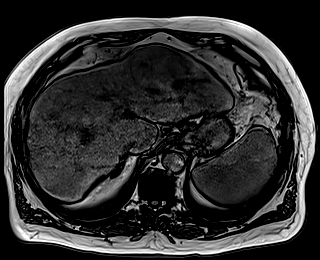
[im 80/80]
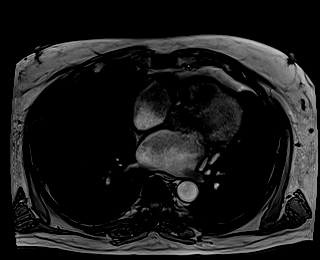

[Series 7: T1 · axial · 3.0mm · 1.25mm/px · z∈[-83,+154]mm · 3 of 80 slices shown (2 of 2)]
[im 1/80]
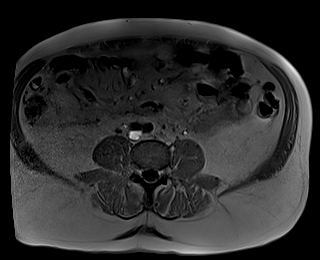
[im 40/80]
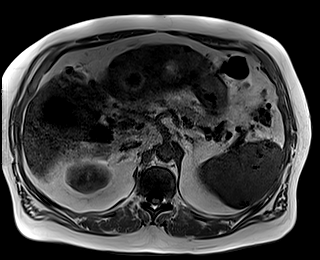
[im 80/80]
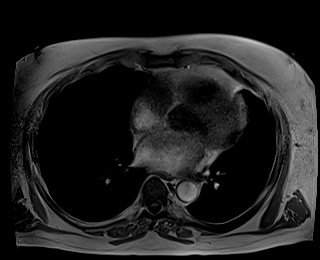

[Series 8: DWI · axial · 6.0mm · 1.57mm/px · z∈[-95,+186]mm · 3 of 80 slices shown (1 of 2)]
[im 1/80]
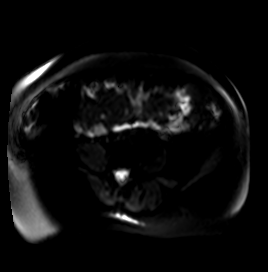
[im 40/80]
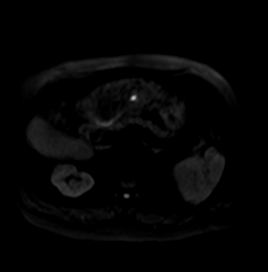
[im 80/80]
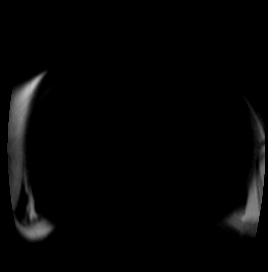

[Series 9: DWI · axial · 6.0mm · 1.57mm/px · z∈[-95,+186]mm · 2 of 40 slices shown (2 of 2)]
[im 1/40]
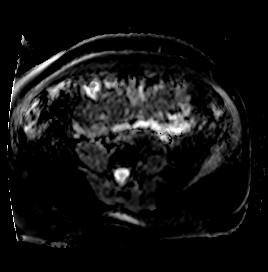
[im 40/40]
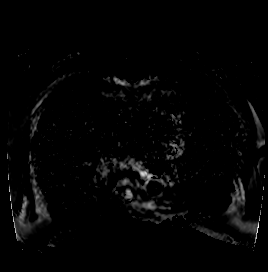

[Series 10: bSSFP · axial · 4.0mm · 0.84mm/px · z∈[-84,+152]mm · 2 of 60 slices shown]
[im 1/60]
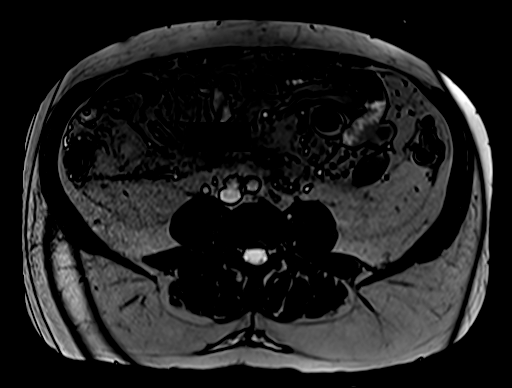
[im 60/60]
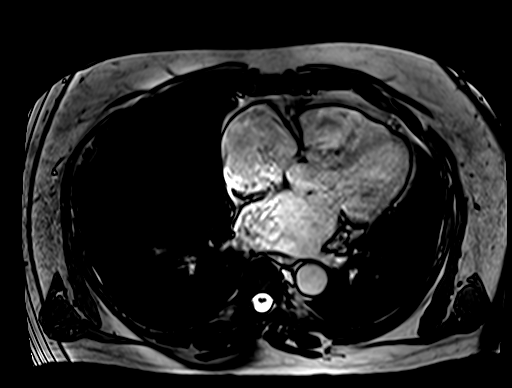

[Series 12: T1 dynamic · axial · 3.0mm · 1.25mm/px · z∈[-84,+153]mm · 3 of 80 slices shown (1 of 6)]
[im 1/80]
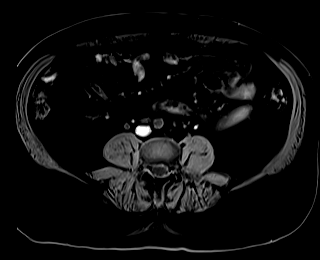
[im 40/80]
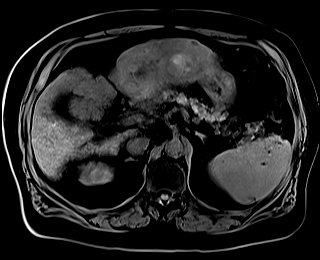
[im 80/80]
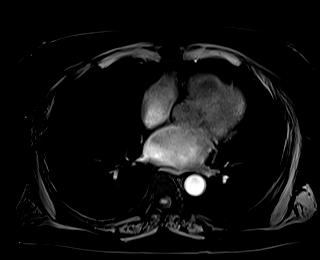

[Series 16: T1 dynamic · axial · 3.0mm · 1.25mm/px · z∈[-84,+153]mm · 3 of 80 slices shown (2 of 6)]
[im 1/80]
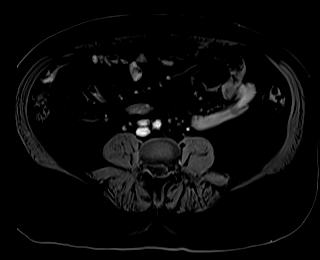
[im 40/80]
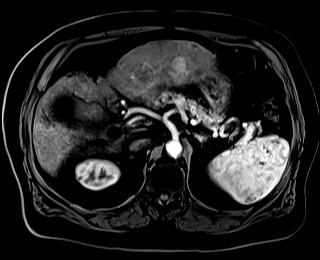
[im 80/80]
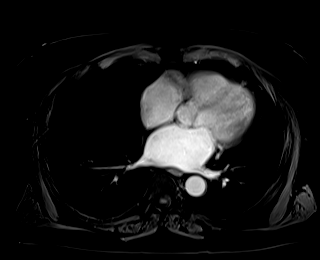

[Series 17: T1 dynamic · axial · 3.0mm · 1.25mm/px · z∈[-84,+153]mm · 3 of 80 slices shown (3 of 6)]
[im 1/80]
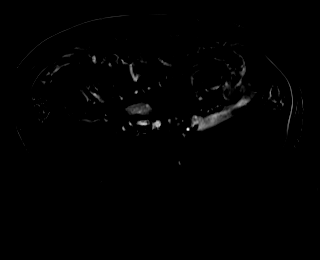
[im 40/80]
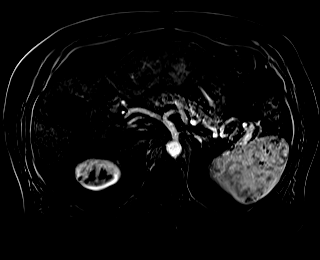
[im 80/80]
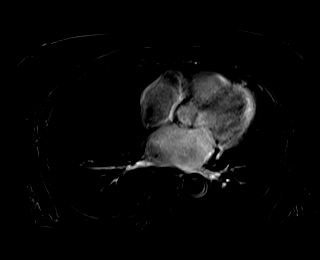

[Series 20: T1 dynamic · axial · 3.0mm · 1.25mm/px · z∈[-84,+153]mm · 3 of 80 slices shown (4 of 6)]
[im 1/80]
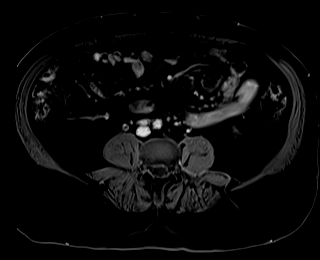
[im 40/80]
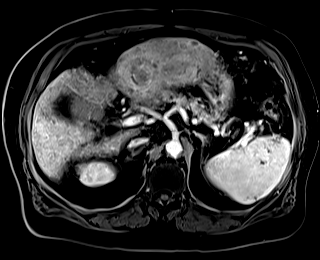
[im 80/80]
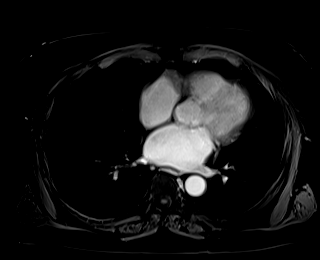

[Series 21: T1 dynamic · axial · 3.0mm · 1.25mm/px · z∈[-84,+153]mm · 3 of 80 slices shown (5 of 6)]
[im 1/80]
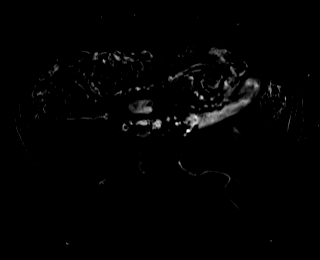
[im 40/80]
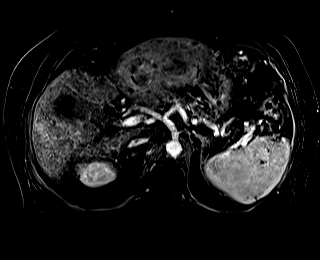
[im 80/80]
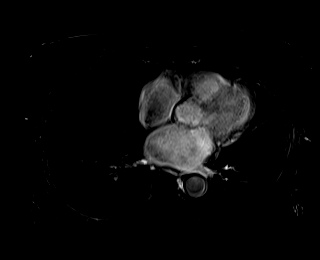

[Series 24: T1 dynamic · axial · 3.0mm · 1.25mm/px · z∈[-84,+153]mm · 3 of 80 slices shown (6 of 6)]
[im 1/80]
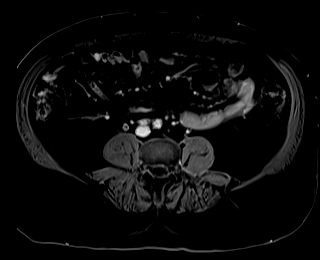
[im 40/80]
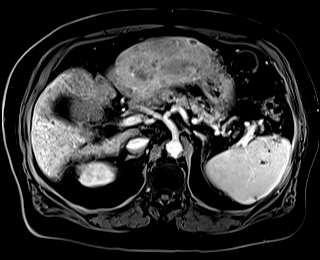
[im 80/80]
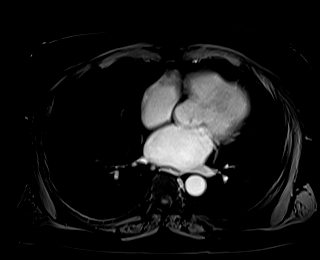

[36 of 48 positions shown; findings below may reference images not displayed]

FINDINGS: Lower chest: No acute findings.

Hepatobiliary: Cirrhotic morphology of the liver, there is diffusely
heterogeneous, nodular enhancement of the liver parenchyma. There
are numerous arterially hyperenhancing lesions, the largest a
subcapsular lesion in the anterior left lobe of the liver, hepatic
segment II, measuring 7.9 x 6.1 cm, demonstrating evidence of
washout and capsular enhancement (series 16, image 26). There is an
additional lesion inferiorly in hepatic segment II, measuring 3.3 x
2.9 cm and again demonstrating evidence of washout and capsule
(series 16, image 37). Lesion adjacent to the gallbladder fossa,
hepatic segment VI measuring 4.9 x 3.1 cm with washout and capsule
(series 16, image 33). Lesion in the inferior left lobe of the
liver, hepatic segment II, demonstrating arterial hyperenhancement
but without evidence of washout or capsule, measuring 1.6 x 1.4 cm
(series 16, image 48). Lesion in the central right lobe of the
liver, hepatic segment V, measuring 1.5 x 1.4 cm, demonstrating
arterial hyperenhancement without evidence of washout or capsule
(series 16, image 26). There are several additional subcentimeter
hyperenhancing foci which are too small to characterize and
extensive hypoenhancing nodularity throughout the liver. Gallstone
in the gallbladder.

Pancreas: 1.3 cm fluid signal cystic lesion of the pancreatic
uncinate inferior to the central pancreatic duct and ampulla (series
3, image 17). 9 mm fluid signal cystic lesion in the pancreatic tail
abutting the main pancreatic duct (series 5, image 21). There are
additional very tiny fluid signal lesions of the pancreatic neck,
measuring up to 4 mm (series 5, image 21). No associated contrast
enhancement. No pancreatic ductal dilatation. No mass, inflammatory
changes, or other parenchymal abnormality identified.

Spleen:  Within normal limits in size and appearance.

Adrenals/Urinary Tract: No masses identified. No evidence of
hydronephrosis.

Stomach/Bowel: Susceptibility artifact in the vicinity of the
pylorus, likely related to a surgical clip. Visualized portions
within the abdomen are otherwise unremarkable.

Vascular/Lymphatic: No pathologically enlarged lymph nodes
identified. No abdominal aortic aneurysm demonstrated.
Gastroesophageal varices.

Other:  Trace perihepatic ascites.

Musculoskeletal: No suspicious bone lesions identified.
IMPRESSION: 1. Cirrhosis with numerous arterially hyperenhancing lesions as
detailed above, the largest a subcapsular lesion in the anterior
left lobe of the liver, hepatic segment II, measuring 7.9 x 6.1 cm,
demonstrating evidence of washout and capsular enhancement. These
lesions are consistent with multifocal hepatocellular carcinoma, the
3 largest lesions categorized as NAGWA 5, 2 additional lesions
categorized as NAGWA 4, and multiple subcentimeter arterial
hyperenhancing foci categorized as NAGWA category 3, and within
the limitations of cross modality comparison the lesions identified
on prior examination are not significantly changed.
2. There are several fluid signal cystic lesions in the pancreas,
largest measuring 1.3 cm in the pancreatic uncinate. These lesions
are most consistent with small side branch IPMNs. No pancreatic
ductal dilatation. Recommend follow-up MRI in 2 years to ensure
ongoing stability, if clinically appropriate given the presence of
multifocal hepatocellular carcinoma. This recommendation follows ACR
consensus guidelines: Management of Incidental Pancreatic Cysts: A
White Paper of the ACR Incidental Findings Committee. [HOSPITAL] 0979;[DATE].
3. Trace perihepatic ascites.
4. Gastroesophageal varices.
5. Cholelithiasis.

ADDENDUM:
Addendum is made to note the presence of incidental mild right
hydronephrosis with a probable calculus in the proximal third of the
right ureter (series 5, image 36). There are numerous bilateral
small renal calculi, in general better assessed by prior CT dated
01/02/2020.

These results will be called to the ordering clinician or
representative by the Radiologist Assistant, and communication
documented in the PACS or [REDACTED].

*** End of Addendum ***
FINDINGS: Lower chest: No acute findings.

Hepatobiliary: Cirrhotic morphology of the liver, there is diffusely
heterogeneous, nodular enhancement of the liver parenchyma. There
are numerous arterially hyperenhancing lesions, the largest a
subcapsular lesion in the anterior left lobe of the liver, hepatic
segment II, measuring 7.9 x 6.1 cm, demonstrating evidence of
washout and capsular enhancement (series 16, image 26). There is an
additional lesion inferiorly in hepatic segment II, measuring 3.3 x
2.9 cm and again demonstrating evidence of washout and capsule
(series 16, image 37). Lesion adjacent to the gallbladder fossa,
hepatic segment VI measuring 4.9 x 3.1 cm with washout and capsule
(series 16, image 33). Lesion in the inferior left lobe of the
liver, hepatic segment II, demonstrating arterial hyperenhancement
but without evidence of washout or capsule, measuring 1.6 x 1.4 cm
(series 16, image 48). Lesion in the central right lobe of the
liver, hepatic segment V, measuring 1.5 x 1.4 cm, demonstrating
arterial hyperenhancement without evidence of washout or capsule
(series 16, image 26). There are several additional subcentimeter
hyperenhancing foci which are too small to characterize and
extensive hypoenhancing nodularity throughout the liver. Gallstone
in the gallbladder.

Pancreas: 1.3 cm fluid signal cystic lesion of the pancreatic
uncinate inferior to the central pancreatic duct and ampulla (series
3, image 17). 9 mm fluid signal cystic lesion in the pancreatic tail
abutting the main pancreatic duct (series 5, image 21). There are
additional very tiny fluid signal lesions of the pancreatic neck,
measuring up to 4 mm (series 5, image 21). No associated contrast
enhancement. No pancreatic ductal dilatation. No mass, inflammatory
changes, or other parenchymal abnormality identified.

Spleen:  Within normal limits in size and appearance.

Adrenals/Urinary Tract: No masses identified. No evidence of
hydronephrosis.

Stomach/Bowel: Susceptibility artifact in the vicinity of the
pylorus, likely related to a surgical clip. Visualized portions
within the abdomen are otherwise unremarkable.

Vascular/Lymphatic: No pathologically enlarged lymph nodes
identified. No abdominal aortic aneurysm demonstrated.
Gastroesophageal varices.

Other:  Trace perihepatic ascites.

Musculoskeletal: No suspicious bone lesions identified.
IMPRESSION: 1. Cirrhosis with numerous arterially hyperenhancing lesions as
detailed above, the largest a subcapsular lesion in the anterior
left lobe of the liver, hepatic segment II, measuring 7.9 x 6.1 cm,
demonstrating evidence of washout and capsular enhancement. These
lesions are consistent with multifocal hepatocellular carcinoma, the
3 largest lesions categorized as NAGWA 5, 2 additional lesions
categorized as NAGWA 4, and multiple subcentimeter arterial
hyperenhancing foci categorized as NAGWA category 3, and within
the limitations of cross modality comparison the lesions identified
on prior examination are not significantly changed.
2. There are several fluid signal cystic lesions in the pancreas,
largest measuring 1.3 cm in the pancreatic uncinate. These lesions
are most consistent with small side branch IPMNs. No pancreatic
ductal dilatation. Recommend follow-up MRI in 2 years to ensure
ongoing stability, if clinically appropriate given the presence of
multifocal hepatocellular carcinoma. This recommendation follows ACR
consensus guidelines: Management of Incidental Pancreatic Cysts: A
White Paper of the ACR Incidental Findings Committee. [HOSPITAL] 0979;[DATE].
3. Trace perihepatic ascites.
4. Gastroesophageal varices.
5. Cholelithiasis.
